# Patient Record
Sex: Female | Born: 1935 | Race: White | Hispanic: No | Marital: Married | Smoking: Never smoker
Health system: Southern US, Community
[De-identification: ages and names within clinical notes are randomized; demographics above are authoritative.]

## PROBLEM LIST (undated history)

## (undated) DIAGNOSIS — E669 Obesity, unspecified: Secondary | ICD-10-CM

## (undated) DIAGNOSIS — I10 Essential (primary) hypertension: Secondary | ICD-10-CM

## (undated) DIAGNOSIS — R942 Abnormal results of pulmonary function studies: Secondary | ICD-10-CM

## (undated) DIAGNOSIS — E119 Type 2 diabetes mellitus without complications: Secondary | ICD-10-CM

## (undated) DIAGNOSIS — I499 Cardiac arrhythmia, unspecified: Secondary | ICD-10-CM

## (undated) DIAGNOSIS — D649 Anemia, unspecified: Secondary | ICD-10-CM

## (undated) DIAGNOSIS — I472 Ventricular tachycardia, unspecified: Secondary | ICD-10-CM

## (undated) DIAGNOSIS — E785 Hyperlipidemia, unspecified: Secondary | ICD-10-CM

## (undated) DIAGNOSIS — Z79899 Other long term (current) drug therapy: Secondary | ICD-10-CM

## (undated) DIAGNOSIS — E039 Hypothyroidism, unspecified: Secondary | ICD-10-CM

## (undated) DIAGNOSIS — W010XXA Fall on same level from slipping, tripping and stumbling without subsequent striking against object, initial encounter: Secondary | ICD-10-CM

## (undated) HISTORY — DX: Hypothyroidism, unspecified: E03.9

## (undated) HISTORY — DX: Hyperlipidemia, unspecified: E78.5

## (undated) HISTORY — DX: Essential (primary) hypertension: I10

## (undated) HISTORY — DX: Other long term (current) drug therapy: Z79.899

## (undated) HISTORY — DX: Obesity, unspecified: E66.9

## (undated) HISTORY — PX: FRACTURE SURGERY: SHX138

## (undated) HISTORY — PX: VAGINAL HYSTERECTOMY: SUR661

## (undated) HISTORY — DX: Abnormal results of pulmonary function studies: R94.2

## (undated) HISTORY — DX: Anemia, unspecified: D64.9

## (undated) HISTORY — DX: Ventricular tachycardia, unspecified: I47.20

## (undated) HISTORY — DX: Ventricular tachycardia: I47.2

## (undated) HISTORY — PX: CATARACT EXTRACTION, BILATERAL: SHX1313

---

## 1976-05-22 HISTORY — PX: CARDIOVERSION: SHX1299

## 1997-11-13 ENCOUNTER — Other Ambulatory Visit: Admission: RE | Admit: 1997-11-13 | Discharge: 1997-11-13 | Payer: Self-pay | Admitting: Cardiology

## 1999-08-15 ENCOUNTER — Encounter: Admission: RE | Admit: 1999-08-15 | Discharge: 1999-08-15 | Payer: Self-pay | Admitting: *Deleted

## 2000-06-25 ENCOUNTER — Other Ambulatory Visit: Admission: RE | Admit: 2000-06-25 | Discharge: 2000-06-25 | Payer: Self-pay | Admitting: *Deleted

## 2000-08-15 ENCOUNTER — Encounter: Admission: RE | Admit: 2000-08-15 | Discharge: 2000-08-15 | Payer: Self-pay | Admitting: *Deleted

## 2000-08-30 ENCOUNTER — Encounter (INDEPENDENT_AMBULATORY_CARE_PROVIDER_SITE_OTHER): Payer: Self-pay | Admitting: Specialist

## 2000-08-30 ENCOUNTER — Ambulatory Visit (HOSPITAL_COMMUNITY): Admission: RE | Admit: 2000-08-30 | Discharge: 2000-08-30 | Payer: Self-pay | Admitting: *Deleted

## 2001-08-26 ENCOUNTER — Encounter: Admission: RE | Admit: 2001-08-26 | Discharge: 2001-08-26 | Payer: Self-pay | Admitting: Internal Medicine

## 2001-08-26 ENCOUNTER — Encounter: Payer: Self-pay | Admitting: Internal Medicine

## 2002-09-01 ENCOUNTER — Encounter: Payer: Self-pay | Admitting: Internal Medicine

## 2002-09-01 ENCOUNTER — Encounter: Admission: RE | Admit: 2002-09-01 | Discharge: 2002-09-01 | Payer: Self-pay | Admitting: Internal Medicine

## 2002-12-29 HISTORY — PX: US ECHOCARDIOGRAPHY: HXRAD669

## 2003-07-07 HISTORY — PX: CARDIOVASCULAR STRESS TEST: SHX262

## 2003-09-09 ENCOUNTER — Encounter: Admission: RE | Admit: 2003-09-09 | Discharge: 2003-09-09 | Payer: Self-pay | Admitting: Internal Medicine

## 2004-09-19 ENCOUNTER — Encounter: Admission: RE | Admit: 2004-09-19 | Discharge: 2004-09-19 | Payer: Self-pay | Admitting: Internal Medicine

## 2005-01-30 ENCOUNTER — Emergency Department (HOSPITAL_COMMUNITY): Admission: EM | Admit: 2005-01-30 | Discharge: 2005-01-30 | Payer: Self-pay | Admitting: Emergency Medicine

## 2005-06-01 ENCOUNTER — Encounter (HOSPITAL_COMMUNITY): Admission: RE | Admit: 2005-06-01 | Discharge: 2005-08-30 | Payer: Self-pay | Admitting: Internal Medicine

## 2005-09-07 ENCOUNTER — Encounter (HOSPITAL_COMMUNITY): Admission: RE | Admit: 2005-09-07 | Discharge: 2005-12-06 | Payer: Self-pay | Admitting: Internal Medicine

## 2005-09-21 ENCOUNTER — Encounter: Admission: RE | Admit: 2005-09-21 | Discharge: 2005-09-21 | Payer: Self-pay | Admitting: Internal Medicine

## 2005-12-14 ENCOUNTER — Encounter (HOSPITAL_COMMUNITY): Admission: RE | Admit: 2005-12-14 | Discharge: 2006-03-14 | Payer: Self-pay | Admitting: Internal Medicine

## 2006-03-22 ENCOUNTER — Encounter (HOSPITAL_COMMUNITY): Admission: RE | Admit: 2006-03-22 | Discharge: 2006-06-20 | Payer: Self-pay | Admitting: Internal Medicine

## 2006-05-22 DIAGNOSIS — R942 Abnormal results of pulmonary function studies: Secondary | ICD-10-CM

## 2006-05-22 HISTORY — DX: Abnormal results of pulmonary function studies: R94.2

## 2006-06-28 ENCOUNTER — Encounter (HOSPITAL_COMMUNITY): Admission: RE | Admit: 2006-06-28 | Discharge: 2006-09-26 | Payer: Self-pay | Admitting: Internal Medicine

## 2006-09-26 ENCOUNTER — Encounter: Admission: RE | Admit: 2006-09-26 | Discharge: 2006-09-26 | Payer: Self-pay | Admitting: Internal Medicine

## 2006-10-04 ENCOUNTER — Encounter (HOSPITAL_COMMUNITY): Admission: RE | Admit: 2006-10-04 | Discharge: 2007-01-02 | Payer: Self-pay | Admitting: Internal Medicine

## 2006-10-08 ENCOUNTER — Inpatient Hospital Stay (HOSPITAL_COMMUNITY): Admission: AD | Admit: 2006-10-08 | Discharge: 2006-10-10 | Payer: Self-pay | Admitting: Cardiology

## 2007-01-07 ENCOUNTER — Ambulatory Visit (HOSPITAL_COMMUNITY): Admission: RE | Admit: 2007-01-07 | Discharge: 2007-01-07 | Payer: Self-pay | Admitting: Cardiology

## 2007-01-17 ENCOUNTER — Encounter (HOSPITAL_COMMUNITY): Admission: RE | Admit: 2007-01-17 | Discharge: 2007-04-17 | Payer: Self-pay | Admitting: Internal Medicine

## 2007-05-09 ENCOUNTER — Encounter (HOSPITAL_COMMUNITY): Admission: RE | Admit: 2007-05-09 | Discharge: 2007-08-07 | Payer: Self-pay | Admitting: Internal Medicine

## 2007-08-15 ENCOUNTER — Encounter (HOSPITAL_COMMUNITY): Admission: RE | Admit: 2007-08-15 | Discharge: 2007-11-13 | Payer: Self-pay | Admitting: Internal Medicine

## 2007-10-09 ENCOUNTER — Encounter: Admission: RE | Admit: 2007-10-09 | Discharge: 2007-10-09 | Payer: Self-pay | Admitting: Internal Medicine

## 2007-12-19 ENCOUNTER — Encounter (HOSPITAL_COMMUNITY): Admission: RE | Admit: 2007-12-19 | Discharge: 2008-03-18 | Payer: Self-pay | Admitting: Internal Medicine

## 2008-04-23 ENCOUNTER — Encounter (HOSPITAL_COMMUNITY): Admission: RE | Admit: 2008-04-23 | Discharge: 2008-05-21 | Payer: Self-pay | Admitting: Internal Medicine

## 2008-06-18 ENCOUNTER — Encounter (HOSPITAL_COMMUNITY): Admission: RE | Admit: 2008-06-18 | Discharge: 2008-08-13 | Payer: Self-pay | Admitting: Internal Medicine

## 2008-09-24 ENCOUNTER — Encounter (HOSPITAL_COMMUNITY): Admission: RE | Admit: 2008-09-24 | Discharge: 2008-12-23 | Payer: Self-pay | Admitting: Internal Medicine

## 2008-10-12 ENCOUNTER — Encounter: Admission: RE | Admit: 2008-10-12 | Discharge: 2008-10-12 | Payer: Self-pay | Admitting: Internal Medicine

## 2008-12-31 ENCOUNTER — Encounter (HOSPITAL_COMMUNITY): Admission: RE | Admit: 2008-12-31 | Discharge: 2009-03-31 | Payer: Self-pay | Admitting: Internal Medicine

## 2009-03-26 ENCOUNTER — Encounter (HOSPITAL_COMMUNITY): Admission: RE | Admit: 2009-03-26 | Discharge: 2009-03-30 | Payer: Self-pay | Admitting: Internal Medicine

## 2009-05-13 ENCOUNTER — Encounter (HOSPITAL_COMMUNITY): Admission: RE | Admit: 2009-05-13 | Discharge: 2009-08-11 | Payer: Self-pay | Admitting: Internal Medicine

## 2009-08-26 ENCOUNTER — Encounter (HOSPITAL_COMMUNITY): Admission: RE | Admit: 2009-08-26 | Discharge: 2009-11-24 | Payer: Self-pay | Admitting: Internal Medicine

## 2009-10-14 ENCOUNTER — Encounter: Admission: RE | Admit: 2009-10-14 | Discharge: 2009-10-14 | Payer: Self-pay | Admitting: Internal Medicine

## 2009-12-02 ENCOUNTER — Encounter (HOSPITAL_COMMUNITY): Admission: RE | Admit: 2009-12-02 | Discharge: 2010-03-02 | Payer: Self-pay | Admitting: Internal Medicine

## 2010-01-05 ENCOUNTER — Ambulatory Visit: Payer: Self-pay | Admitting: Cardiology

## 2010-03-08 ENCOUNTER — Encounter (HOSPITAL_COMMUNITY)
Admission: RE | Admit: 2010-03-08 | Discharge: 2010-06-06 | Payer: Self-pay | Source: Home / Self Care | Attending: Internal Medicine | Admitting: Internal Medicine

## 2010-05-02 ENCOUNTER — Ambulatory Visit: Payer: Self-pay | Admitting: Cardiology

## 2010-06-01 ENCOUNTER — Ambulatory Visit: Payer: Self-pay | Admitting: Cardiology

## 2010-06-30 ENCOUNTER — Encounter (HOSPITAL_COMMUNITY)
Admission: RE | Admit: 2010-06-30 | Discharge: 2010-06-30 | Disposition: A | Discharge status: Home / Self Care | Payer: Medicare Other | Source: Ambulatory Visit | Attending: Internal Medicine | Admitting: Internal Medicine

## 2010-06-30 ENCOUNTER — Other Ambulatory Visit: Payer: Self-pay

## 2010-06-30 DIAGNOSIS — N289 Disorder of kidney and ureter, unspecified: Secondary | ICD-10-CM | POA: Insufficient documentation

## 2010-06-30 DIAGNOSIS — N2581 Secondary hyperparathyroidism of renal origin: Secondary | ICD-10-CM | POA: Insufficient documentation

## 2010-06-30 DIAGNOSIS — D638 Anemia in other chronic diseases classified elsewhere: Secondary | ICD-10-CM | POA: Insufficient documentation

## 2010-08-01 LAB — POCT HEMOGLOBIN-HEMACUE: Hemoglobin: 11.1 g/dL — ABNORMAL LOW (ref 12.0–15.0)

## 2010-08-04 LAB — POCT HEMOGLOBIN-HEMACUE: Hemoglobin: 11.4 g/dL — ABNORMAL LOW (ref 12.0–15.0)

## 2010-08-05 LAB — POCT HEMOGLOBIN-HEMACUE: Hemoglobin: 10.4 g/dL — ABNORMAL LOW (ref 12.0–15.0)

## 2010-08-08 LAB — POCT HEMOGLOBIN-HEMACUE: Hemoglobin: 11.5 g/dL — ABNORMAL LOW (ref 12.0–15.0)

## 2010-08-10 LAB — POCT HEMOGLOBIN-HEMACUE: Hemoglobin: 11.2 g/dL — ABNORMAL LOW (ref 12.0–15.0)

## 2010-08-11 ENCOUNTER — Other Ambulatory Visit: Payer: Self-pay | Admitting: Internal Medicine

## 2010-08-11 ENCOUNTER — Encounter (HOSPITAL_COMMUNITY): Payer: Medicare Other | Attending: Internal Medicine

## 2010-08-11 DIAGNOSIS — D638 Anemia in other chronic diseases classified elsewhere: Secondary | ICD-10-CM | POA: Insufficient documentation

## 2010-08-12 LAB — POCT HEMOGLOBIN-HEMACUE: Hemoglobin: 10.7 g/dL — ABNORMAL LOW (ref 12.0–15.0)

## 2010-08-14 LAB — HEMOGLOBIN AND HEMATOCRIT, BLOOD: HCT: 36.7 % (ref 36.0–46.0)

## 2010-08-24 ENCOUNTER — Encounter: Payer: Self-pay | Admitting: Nurse Practitioner

## 2010-08-25 ENCOUNTER — Other Ambulatory Visit: Payer: Self-pay | Admitting: Cardiology

## 2010-08-25 ENCOUNTER — Encounter: Payer: Self-pay | Admitting: Nurse Practitioner

## 2010-08-25 DIAGNOSIS — Z79899 Other long term (current) drug therapy: Secondary | ICD-10-CM

## 2010-08-25 DIAGNOSIS — E039 Hypothyroidism, unspecified: Secondary | ICD-10-CM

## 2010-08-25 DIAGNOSIS — O24919 Unspecified diabetes mellitus in pregnancy, unspecified trimester: Secondary | ICD-10-CM

## 2010-08-25 DIAGNOSIS — D649 Anemia, unspecified: Secondary | ICD-10-CM

## 2010-08-28 LAB — POCT HEMOGLOBIN-HEMACUE: Hemoglobin: 10.1 g/dL — ABNORMAL LOW (ref 12.0–15.0)

## 2010-08-29 ENCOUNTER — Other Ambulatory Visit (INDEPENDENT_AMBULATORY_CARE_PROVIDER_SITE_OTHER): Payer: Medicare Other | Admitting: *Deleted

## 2010-08-29 ENCOUNTER — Ambulatory Visit (INDEPENDENT_AMBULATORY_CARE_PROVIDER_SITE_OTHER): Payer: Medicare Other | Admitting: Nurse Practitioner

## 2010-08-29 ENCOUNTER — Encounter: Payer: Self-pay | Admitting: Nurse Practitioner

## 2010-08-29 VITALS — BP 140/80 | HR 68 | Ht 62.0 in | Wt 182.8 lb

## 2010-08-29 DIAGNOSIS — D649 Anemia, unspecified: Secondary | ICD-10-CM

## 2010-08-29 DIAGNOSIS — O24919 Unspecified diabetes mellitus in pregnancy, unspecified trimester: Secondary | ICD-10-CM

## 2010-08-29 DIAGNOSIS — R942 Abnormal results of pulmonary function studies: Secondary | ICD-10-CM

## 2010-08-29 DIAGNOSIS — Z79899 Other long term (current) drug therapy: Secondary | ICD-10-CM

## 2010-08-29 DIAGNOSIS — Z8679 Personal history of other diseases of the circulatory system: Secondary | ICD-10-CM | POA: Insufficient documentation

## 2010-08-29 DIAGNOSIS — M109 Gout, unspecified: Secondary | ICD-10-CM | POA: Insufficient documentation

## 2010-08-29 DIAGNOSIS — E785 Hyperlipidemia, unspecified: Secondary | ICD-10-CM

## 2010-08-29 DIAGNOSIS — I10 Essential (primary) hypertension: Secondary | ICD-10-CM

## 2010-08-29 DIAGNOSIS — I472 Ventricular tachycardia: Secondary | ICD-10-CM

## 2010-08-29 DIAGNOSIS — E039 Hypothyroidism, unspecified: Secondary | ICD-10-CM

## 2010-08-29 LAB — CBC WITH DIFFERENTIAL/PLATELET
Basophils Absolute: 0 10*3/uL (ref 0.0–0.1)
Eosinophils Relative: 1.4 % (ref 0.0–5.0)
HCT: 33.7 % — ABNORMAL LOW (ref 36.0–46.0)
Hemoglobin: 11.3 g/dL — ABNORMAL LOW (ref 12.0–15.0)
Lymphocytes Relative: 37 % (ref 12.0–46.0)
Lymphs Abs: 2.5 10*3/uL (ref 0.7–4.0)
Monocytes Relative: 8.2 % (ref 3.0–12.0)
Platelets: 152 10*3/uL (ref 150.0–400.0)
WBC: 6.9 10*3/uL (ref 4.5–10.5)

## 2010-08-29 LAB — BASIC METABOLIC PANEL
BUN: 29 mg/dL — ABNORMAL HIGH (ref 6–23)
CO2: 22 mEq/L (ref 19–32)
Calcium: 9.4 mg/dL (ref 8.4–10.5)
GFR: 40.08 mL/min — ABNORMAL LOW (ref >60.00)
Glucose, Bld: 172 mg/dL — ABNORMAL HIGH (ref 70–99)

## 2010-08-29 LAB — HEPATIC FUNCTION PANEL
AST: 27 U/L (ref 0–37)
Albumin: 3.4 g/dL — ABNORMAL LOW (ref 3.5–5.2)
Total Protein: 6.7 g/dL (ref 6.0–8.3)

## 2010-08-29 LAB — TSH: TSH: 4.28 u[IU]/mL (ref 0.35–5.50)

## 2010-08-29 MED ORDER — ALLOPURINOL 300 MG PO TABS: 300.0000 mg | ORAL_TABLET | Freq: Every day | ORAL | Status: DC

## 2010-08-29 MED ORDER — PRAVASTATIN SODIUM 20 MG PO TABS: 20.0000 mg | ORAL_TABLET | Freq: Every day | ORAL | Status: DC

## 2010-08-29 NOTE — Assessment & Plan Note (Signed)
She remains on Pravachol. Refill is given today.

## 2010-08-29 NOTE — Patient Instructions (Signed)
Monitor your blood pressure when you go to [place] your readings and bring to your next visit. Limit sodium intake. I will have you see Dr. Rayann Heman in 4 months. You will need fasting labs at that time.

## 2010-08-29 NOTE — Assessment & Plan Note (Signed)
Blood pressure is now trending down. She had just taken her medicines prior to the visit. I encouraged her to monitor her blood pressure when she goes to [employer].

## 2010-08-29 NOTE — Assessment & Plan Note (Signed)
No recurrence. Remains on long term amiodarone.

## 2010-08-29 NOTE — Progress Notes (Signed)
History of Present Illness: Leslie Coleman is seen today for her 3 month visit. She is seen for Dr. Doreatha Lew. She is doing well. She is tolerating her medicines. She adamantly denies shortness of breath. She does not check her blood pressure at home but does go to [place] on a weekly basis. She just took her blood pressure medicines before today's visit. She is upset about Dr. Doreatha Lew retiring. She has no specific complaint.   Current Outpatient Prescriptions on File Prior to Visit  Medication Sig Dispense Refill  . acetaminophen (TYLENOL) 500 MG tablet Take 500 mg by mouth as needed.        Marland Kitchen amiodarone (PACERONE) 200 MG tablet Take 200 mg by mouth daily.        Marland Kitchen amLODipine (NORVASC) 10 MG tablet Take 10 mg by mouth daily.        Marland Kitchen atenolol (TENORMIN) 100 MG tablet Take 100 mg by mouth 2 (two) times daily.        Marland Kitchen Epoetin Alfa (PROCRIT IJ) Inject as directed. As directed       . glyBURIDE-metformin (GLUCOVANCE) 5-500 MG per tablet Take 1 tablet by mouth 2 (two) times daily with a meal.        . hydrALAZINE (APRESOLINE) 25 MG tablet Take 25 mg by mouth 2 (two) times daily.        . Iron-Vitamins (GERITOL PO) Take by mouth at bedtime.        Marland Kitchen levothyroxine (SYNTHROID, LEVOTHROID) 25 MCG tablet Take 25 mcg by mouth daily. Taking one tablet days per week and two tablets two days per week       . Multiple Vitamins-Iron (MULTIVITAMINS WITH IRON) TABS Take 1 tablet by mouth daily.        Marland Kitchen omega-3 acid ethyl esters (LOVAZA) 1 G capsule Take 2 g by mouth 2 (two) times daily.        . perindopril (ACEON) 8 MG tablet Take 8 mg by mouth 2 (two) times daily.        . pioglitazone (ACTOS) 30 MG tablet Take 30 mg by mouth daily.        . potassium chloride (KLOR-CON) 10 MEQ CR tablet Take 10 mEq by mouth daily.        Marland Kitchen telmisartan-hydrochlorothiazide (MICARDIS HCT) 80-12.5 MG per tablet Take 1 tablet by mouth daily.        Marland Kitchen DISCONTD: allopurinol (ZYLOPRIM) 300 MG tablet Take 300 mg by mouth daily.        Marland Kitchen  DISCONTD: pravastatin (PRAVACHOL) 20 MG tablet Take 20 mg by mouth daily.          No Known Allergies  Past Medical History  Diagnosis Date  . V-tach     on chronic amiodarone therapy  . HTN (hypertension)     Last echo in 2004  . Hyperlipidemia   . Diabetes mellitus   . Obesity   . Hypothyroidism     on replacement  . Anemia     treated with Procrit  . Gout   . Abnormal PFTs (pulmonary function tests)     Past Surgical History  Procedure Date  . Cardioversion 1978  . Cesarean section     x 3  . Abdominal hysterectomy     History  Smoking status  . Never Smoker   Smokeless tobacco  . Never Used    History  Alcohol Use No    Family History  Problem Relation Age of Onset  . Heart disease  Mother   . Heart disease Father     Review of Systems: The review of systems is as above. She is not dizzy. She has no chest pain. She denies dyspnea or excessive fatigue. She does not exercise. She has had no gout flareups..  All other systems were reviewed and are negative.  Physical Exam: BP 140/80  Pulse 68  Ht 5\' 2"  (1.575 m)  Wt 182 lb 12.8 oz (82.918 kg)  BMI 33.43 kg/m2 Patient is very pleasant and in no acute distress. She is obese.  Skin is warm and dry. Color is chronically sallow.  HEENT is unremarkable. Normocephalic/atraumatic. PERRL. Sclera are nonicteric. Neck is supple. No masses. No JVD. Lungs are clear. Cardiac exam shows a regular rate and rhythm. Abdomen is soft. Extremities are without edema. Gait and ROM are intact. No gross neurologic deficits noted.  LABORATORY DATA:  Pending  Assessment / Plan:

## 2010-08-29 NOTE — Assessment & Plan Note (Signed)
No recurrence. Her allopurinol is refilled today.

## 2010-08-29 NOTE — Assessment & Plan Note (Signed)
She remains on her amiodarone. Follow up labs are checked today. She has had no recurrence of her Vtach. I will have her see Dr. Rayann Heman in 3 months with repeat labs on return. Patient is agreeable to this plan and will call if any problems develop in the interim.

## 2010-08-29 NOTE — Assessment & Plan Note (Signed)
Clinically not short of breath. Will need to update on return visit.

## 2010-08-31 ENCOUNTER — Telehealth: Payer: Self-pay | Admitting: *Deleted

## 2010-08-31 NOTE — Telephone Encounter (Signed)
Pt notified of lab results and to continue same medications.

## 2010-08-31 NOTE — Telephone Encounter (Signed)
Message copied by Raelene Bott on Wed Aug 31, 2010  8:04 AM ------      Message from: Truitt Merle      Created: Mon Aug 29, 2010  2:07 PM       Ok to report. Labs are satisfactory. Has chronic anemia. Hemoglobin unchanged. Renal function is a little better. Continue with same dose of synthroid.      Will recheck labs on return visit.

## 2010-09-06 LAB — POCT HEMOGLOBIN-HEMACUE: Hemoglobin: 10.5 g/dL — ABNORMAL LOW (ref 12.0–15.0)

## 2010-09-20 ENCOUNTER — Other Ambulatory Visit: Payer: Self-pay | Admitting: Internal Medicine

## 2010-09-20 DIAGNOSIS — Z1231 Encounter for screening mammogram for malignant neoplasm of breast: Secondary | ICD-10-CM

## 2010-09-22 ENCOUNTER — Encounter (HOSPITAL_COMMUNITY): Payer: Medicare Other | Attending: Internal Medicine

## 2010-09-22 ENCOUNTER — Other Ambulatory Visit: Payer: Self-pay | Admitting: Internal Medicine

## 2010-09-22 DIAGNOSIS — D638 Anemia in other chronic diseases classified elsewhere: Secondary | ICD-10-CM | POA: Insufficient documentation

## 2010-09-22 DIAGNOSIS — N2581 Secondary hyperparathyroidism of renal origin: Secondary | ICD-10-CM | POA: Insufficient documentation

## 2010-09-22 DIAGNOSIS — N289 Disorder of kidney and ureter, unspecified: Secondary | ICD-10-CM | POA: Insufficient documentation

## 2010-09-30 ENCOUNTER — Other Ambulatory Visit: Payer: Self-pay | Admitting: Nurse Practitioner

## 2010-09-30 ENCOUNTER — Other Ambulatory Visit: Payer: Self-pay | Admitting: *Deleted

## 2010-09-30 MED ORDER — LEVOTHYROXINE SODIUM 25 MCG PO TABS: 25.0000 ug | ORAL_TABLET | Freq: Every day | ORAL | Status: DC

## 2010-09-30 NOTE — Telephone Encounter (Signed)
NEEDS TO GET SCRIPT CALLED INTO [facility] [address] SYNTHYROID 25MCG. WANTS GENERIC. WANTS ENOUGH FOR AT LEAST 10 DAYS THEN PHONE IT INTO [facility]. [id number] SCRIPT NUMBER IS [id number]

## 2010-09-30 NOTE — Telephone Encounter (Signed)
Revised prescription sent today for Synthroid 25 mcg to #20 tablets.  Called to [facility] at [phone].  Pt notified.

## 2010-10-04 NOTE — Discharge Summary (Signed)
NAMESOLANCH, RICHOUX               ACCOUNT NO.:  [id number]   MEDICAL RECORD NO.:  [id number]          PATIENT TYPE:  INP   LOCATION:  3740                         FACILITY:  [facility]   PHYSICIAN:  Ludwig Lean. Doreatha Lew, M.D.DATE OF BIRTH:  10-24-1928   DATE OF ADMISSION:  10/08/2006  DATE OF DISCHARGE:  10/10/2006                               DISCHARGE SUMMARY   PRIMARY DISCHARGE DIAGNOSES:  1. Remote history of ventricular tachycardia subsequently with      discontinuation of procainamide and successful initiation of      loading with amiodarone.  2. Abnormal pulmonary function tests with diffusion defect that is      consistent with a pulmonary vascular process.  There is no      significant obstruction.  The reduced diffusing capacity indicates      a moderate loss of functional alveolar capillary surface.   SECONDARY DISCHARGE DIAGNOSES:  1. Hypertensive heart disease.  2. Remote history of ventricular tachycardia in 1978, 1986 and again      in 1988.  She had previously been maintained on procainamide for      the past 20 years.  3. Diabetes.  4. Anemia.  She is on Procrit.  5. Dyslipidemia.  6. History of gout.   HISTORY OF PRESENT ILLNESS:  The patient is a very pleasant 77 year old  white female who has multiple medical problems.  She presented for  elective initiation of amiodarone.  She has been maintained on  procainamide for the past 20 years through a documented history of  ventricular tachycardia, but now is unable to have her medicine  available to her from the pharmacies or distributors.  She has had no  recurrence of arrhythmia but is felt to need to be maintained on chronic  antiarrhythmic therapy.   Please see the dictated history and physical for further patient  presentation and profile.   LABORATORY DATA:  On admission, her EKG showed sinus bradycardia.  CBC  showed a hemoglobin of 11, hematocrit 33.  TSH was normal.  Chemistries  were normal with a BUN  of 18, creatinine was 1.1, potassium 4.9, glucose  was 147.  Her chest x-ray showed borderline heart size with no edema.  Her PFTs are as noted above.   HOSPITAL COURSE:  The patient was admitted electively.  Procainamide was  discontinued on the morning of admission.  She was subsequently started  on amiodarone which has been tolerated without problems.  Her PFTs have  been reviewed, and it is felt that we will monitor this closely as an  outpatient and will continue with only low-dose antiarrhythmic therapy.   Today on Oct 10, 2006, she is doing well without complaints.  She has  had no recurrence of arrhythmia.  She is been up and ambulatory without  problems.  She appears to be tolerating her medicines without problems,  and she is felt to be a stable candidate for discharge.   DISCHARGE CONDITION:  Stable.   DISCHARGE DIET:  Diabetic.   ACTIVITY:  No restrictions.   DISCHARGE MEDICATIONS:  1. Micardis HCT 80/12.5 daily.  2. Norvasc 10 mg a day.  3. Atenolol 100 b.i.d.  4. Glucovance as before.  5. Allopurinol 300 mg a day.  6. KCl 10 mEq a day.  7. Lopid 600 b.i.d.  8. Actos 30 mg a day.  9. Aceon 8 mg a day.  10.Multivitamin and Geritol daily.  11.Luvasa two in the morning, two in the evening  (Procainamide has been discontinued).  1. She will be sent home on amiodarone 200 mg b.i.d. until seen back      in the office with plans to decrease that to once a day.   Will plan on seeing her back in the office in 1 week.  She will need to  have follow up PFTs an 3 months, 6 months, 9 months and again in 12  months.  She is to call if any problems arise in the interim.      Doyle Askew, N.P.      Ludwig Lean. Doreatha Lew, M.D.  Electronically Signed    LC/MEDQ  D:  10/10/2006  T:  10/10/2006  Job:  [id number]

## 2010-10-04 NOTE — H&P (Signed)
Leslie Coleman, Leslie Coleman               ACCOUNT NO.:  [id number]   MEDICAL RECORD NO.:  [id number]          PATIENT TYPE:  INP   LOCATION:  3740                         FACILITY:  [facility]   PHYSICIAN:  Ludwig Lean. Doreatha Lew, M.D.DATE OF BIRTH:  12-Sep-1928   DATE OF ADMISSION:  10/08/2006  DATE OF DISCHARGE:                              HISTORY & PHYSICAL   CHIEF COMPLAINT:  None.   HISTORY OF PRESENT ILLNESS:  Leslie Coleman is a very pleasant 77 year old white  female who has multiple medical problems.  She presents for initiation  of amiodarone.  She has been maintained on procainamide for the past 20  years through a documented history of ventricular tachycardia.  She had  an episode in 1978, and then again in 1986.  At that time, she went to  [facility] and had a negative VP study.  In 1988, she had recurrence of  her arrhythmia and she has been on procainamide ever since.  This  medicine is no longer available through the pharmacies or the  distributor.  She has had no recurrence of her arrhythmia, but is felt  to need to be maintained on chronic anti-arrhythmic therapy.   PAST MEDICAL HISTORY:  1. Hypertensive heart disease.  Her last 2D echocardiogram was in      August of 2004 which showed an ejection fraction of 60 to 65% with      normal LV systolic function, mild aorta sclerosis in the LV wall      thickness of the upper limits of normal.  2. Remote history of ventricular tachycardia in 1978, 1986, and again      in 1988.  She has been maintained on procainamide for the past 20      years.  3. Diabetes.  4. Anemia.  She is on Procrit.  5. Dyslipidemia.  6. Remote cardioversion in 1978 for SVT with aberrancy.  7. C-section times 3.  8. Remote hysterectomy.  9. Gout.   ALLERGIES:  NONE.   CURRENT MEDICATIONS:  1. She was previously on procainamide 100 mg q.i.d.  Her last dose was      this morning on Oct 08, 2006.  2. Norvasc 10 mg a day.  3. Atenolol 100 b.i.d.  4. Glucovance  5/500 two tablets b.i.d.  5. Allopurinol 300 mg a day.  6. Potassium 10 meq a day.  7. Lopid 600 b.i.d.  8. Actos 30 a day.  9. Aceon 8 mg a day.  10.Multivitamin with iron daily.  11.Geritol daily.  12.Micardis HCT 80/12.5 daily.  13.Procrit.  14.Lovaza 2 in the morning, 2 in the evening.   FAMILY HISTORY:  Positive for heart disease.   SOCIAL HISTORY:  She is married.  She just recently celebrated the birth  of her first great grandchild.  She has no tobacco or alcohol.   REVIEW OF SYSTEMS:  Is as noted above and is otherwise unremarkable.  She has had no complaints of chest pain or shortness of breath.  No  complaints of palpitations or recurrent arrhythmia.   EXAMINATION:  GENERAL:  She is a very pleasant elderly  white female in  no acute distress.  VITAL SIGNS:  Blood pressure 140/90, heart rate is in the 70s,  respirations 18, she is afebrile.  SKIN:  Warm and dry.  Color is unremarkable.  LUNGS:  Clear.  HEART:  Shows a regular rhythm.  ABDOMEN:  Obese, yet soft.  Positive bowel sounds.  EXTREMITIES:  Without edema.  NEUROLOGIC:  Shows no gross focal deficit.   PERTINENT LABS:  Pending.   OVERALL IMPRESSION:  Remote ventricular tachycardia with a negative VP  study dating back to 65.  She had recurrence of her arrhythmia in 1988  and has been maintained on chronic procainamide since that time.   PLAN:  She will be switched over to amiodarone.  She will be monitored  on telemetry.  Will do daily EKGs.  Her other medicines will continue  per her home routine.      Leslie Coleman, N.P.      Ludwig Lean. Doreatha Lew, M.D.  Electronically Signed    LC/MEDQ  D:  10/08/2006  T:  10/08/2006  Job:  [id number]   cc:   Leslie Coleman, M.D.

## 2010-10-07 NOTE — Procedures (Signed)
[facility]  Patient:    Leslie Coleman, Leslie Coleman                      MRN: [id number] Proc. Date: 08/30/00 Adm. Date:  [id number] Attending:  Jim Desanctis                           Procedure Report  PROCEDURE:  Upper endoscopy.  INDICATIONS FOR PROCEDURE:  GI bleed.  ANESTHESIA:  Demerol 40, Versed 6 mg.  DESCRIPTION OF PROCEDURE:  With the patient mildly sedated in the left lateral decubitus position, the Olympus video endoscope was inserted in the mouth and passed under direct vision through the esophagus, stomach and into the duodenum all of which appeared normal. From this point, the endoscope was slowly withdrawn taking circumferential views of the entire duodenal mucosa until the endoscope was then pulled back into the stomach and placed in retroflexion to view the stomach from below which showed a hiatal hernia. The endoscope was then straightened and withdrawn taking circumferential views of the entire gastric and subsequently esophageal mucosa which otherwise appeared normal. Other than some mild changes of esophagitis, the patients vital signs and pulse oximeter remained stable. The patient tolerated the procedure well without apparent complications.  FINDINGS:  Mild esophagitis above a small hiatal hernia otherwise unremarkable examination.  PLAN:  Proceed to colonoscopy. DD:  08/30/00 TD:  08/30/00 Job: [id number]

## 2010-10-07 NOTE — Procedures (Signed)
[facility]  Patient:    POLET, ALLGOOD                      MRN: [id number] Proc. Date: 08/30/00 Adm. Date:  [id number] Attending:  Jim Desanctis                           Procedure Report  PROCEDURE:  Colonoscopy.  INDICATIONS FOR PROCEDURE:  Hemoccult positivity.  ANESTHESIA:  Demerol 20 mg.  DESCRIPTION OF PROCEDURE:  With the patient mildly sedated in the left lateral decubitus position, the Olympus video colonoscope was inserted in the rectum and passed easily under direct vision to the cecum identified by the ileocecal valve and appendiceal orifice both of which were photographed. From this point, the colonoscope was slowly withdrawn taking circumferential views of the entire colonic mucosa. There was a small question of a polyp seen in the descending colon which was removed using hot biopsy forceps technique on a setting of 20:20 blended current. The endoscope was then withdrawn to the rectum which appeared normal in direct and retroflexed view. The endoscope was straightened and withdrawn. The patients vital signs and pulse oximeter remained stable. The patient tolerated the procedure well without apparent complications.  FINDINGS:  Small polyp question of descending colon.  PLAN:  Await biopsy report. The patient will call me for results and followup with me in as an outpatient. DD:  08/30/00 TD:  08/30/00 Job: [id number]

## 2010-10-19 ENCOUNTER — Ambulatory Visit
Admission: RE | Admit: 2010-10-19 | Discharge: 2010-10-19 | Disposition: A | Discharge status: Home / Self Care | Payer: Medicare Other | Source: Ambulatory Visit | Attending: Internal Medicine | Admitting: Internal Medicine

## 2010-10-19 DIAGNOSIS — Z1231 Encounter for screening mammogram for malignant neoplasm of breast: Secondary | ICD-10-CM

## 2010-11-03 ENCOUNTER — Other Ambulatory Visit: Payer: Self-pay | Admitting: Internal Medicine

## 2010-11-03 ENCOUNTER — Encounter (HOSPITAL_COMMUNITY): Payer: Medicare Other | Attending: Internal Medicine

## 2010-11-03 DIAGNOSIS — N289 Disorder of kidney and ureter, unspecified: Secondary | ICD-10-CM | POA: Insufficient documentation

## 2010-11-03 DIAGNOSIS — D638 Anemia in other chronic diseases classified elsewhere: Secondary | ICD-10-CM | POA: Insufficient documentation

## 2010-11-03 DIAGNOSIS — N2581 Secondary hyperparathyroidism of renal origin: Secondary | ICD-10-CM | POA: Insufficient documentation

## 2010-12-15 ENCOUNTER — Other Ambulatory Visit: Payer: Self-pay | Admitting: Internal Medicine

## 2010-12-15 ENCOUNTER — Encounter (HOSPITAL_COMMUNITY): Payer: Medicare Other | Attending: Internal Medicine

## 2010-12-15 DIAGNOSIS — N289 Disorder of kidney and ureter, unspecified: Secondary | ICD-10-CM | POA: Insufficient documentation

## 2010-12-15 DIAGNOSIS — D638 Anemia in other chronic diseases classified elsewhere: Secondary | ICD-10-CM | POA: Insufficient documentation

## 2010-12-15 DIAGNOSIS — N2581 Secondary hyperparathyroidism of renal origin: Secondary | ICD-10-CM | POA: Insufficient documentation

## 2010-12-15 LAB — POCT HEMOGLOBIN-HEMACUE: Hemoglobin: 11 g/dL — ABNORMAL LOW (ref 12.0–15.0)

## 2011-01-06 ENCOUNTER — Encounter: Payer: Self-pay | Admitting: Internal Medicine

## 2011-01-09 ENCOUNTER — Ambulatory Visit: Payer: Medicare Other | Admitting: Internal Medicine

## 2011-01-09 ENCOUNTER — Encounter: Payer: Self-pay | Admitting: Internal Medicine

## 2011-01-09 ENCOUNTER — Ambulatory Visit (INDEPENDENT_AMBULATORY_CARE_PROVIDER_SITE_OTHER): Payer: Medicare Other | Admitting: Internal Medicine

## 2011-01-09 VITALS — BP 132/74 | HR 62 | Ht 62.0 in | Wt 188.8 lb

## 2011-01-09 DIAGNOSIS — I472 Ventricular tachycardia: Secondary | ICD-10-CM

## 2011-01-09 DIAGNOSIS — I10 Essential (primary) hypertension: Secondary | ICD-10-CM

## 2011-01-09 DIAGNOSIS — E785 Hyperlipidemia, unspecified: Secondary | ICD-10-CM

## 2011-01-09 NOTE — Patient Instructions (Signed)
Your physician recommends that you schedule a follow-up appointment in: 4 months with Kathrene Alu and Dr Rayann Heman in 8 months  Your physician recommends that you continue on your current medications as directed. Please refer to the Current Medication list given to you today.

## 2011-01-09 NOTE — Assessment & Plan Note (Signed)
Stable No change required today  

## 2011-01-09 NOTE — Assessment & Plan Note (Signed)
Controlled with amiodarone No changes today She will need LFTs and TFTs upon return with Truitt Merle.

## 2011-01-09 NOTE — Progress Notes (Signed)
Leslie Coleman is a pleasant 77 y.o. WF patient with a h/o remote ventricular tachycardia followed previously by Dr Doreatha Lew.  She had EP study (per his report) in the 1980s with no inducible VT.  She was treated initially with procainamide but has been on amiodarone since 2008.  She appears to be doing very well and remains active despite her age.  She denies any recent episodes of arrhythmia.  Today, she denies symptoms of palpitations, chest pain, shortness of breath, orthopnea, PND, lower extremity edema, dizziness, presyncope, syncope, or neurologic sequela. The patient is tolerating medications without difficulties and is otherwise without complaint today.   Past Medical History  Diagnosis Date  . V-tach     remote VT, treated initially with procainamide, on amiodarone since 2008  . HTN (hypertension)     Last echo in 2004  . Hyperlipidemia   . Diabetes mellitus   . Obesity   . Hypothyroidism     on replacement  . Anemia     treated with Procrit  . Gout   . Abnormal PFTs (pulmonary function tests)    Past Surgical History  Procedure Date  . Cardioversion 1978  . Cesarean section     x 3  . Abdominal hysterectomy     Current Outpatient Prescriptions  Medication Sig Dispense Refill  . acetaminophen (TYLENOL) 500 MG tablet Take 500 mg by mouth as needed.        Marland Kitchen allopurinol (ZYLOPRIM) 300 MG tablet Take 1 tablet (300 mg total) by mouth daily.  90 tablet  3  . amiodarone (PACERONE) 200 MG tablet Take 200 mg by mouth daily.        Marland Kitchen amLODipine (NORVASC) 10 MG tablet Take 10 mg by mouth daily.        Marland Kitchen atenolol (TENORMIN) 100 MG tablet Take 100 mg by mouth 2 (two) times daily.        Marland Kitchen Epoetin Alfa (PROCRIT IJ) Inject as directed. As directed       . glyBURIDE-metformin (GLUCOVANCE) 5-500 MG per tablet Take 1 tablet by mouth 2 (two) times daily with a meal.        . hydrALAZINE (APRESOLINE) 25 MG tablet Take 25 mg by mouth 2 (two) times daily.        . Iron-Vitamins (GERITOL  PO) Take by mouth at bedtime.        Marland Kitchen levothyroxine (SYNTHROID, LEVOTHROID) 25 MCG tablet Take 1 tablet (25 mcg total) by mouth daily. Taking one tablet days per week and two tablets two days per week  10 tablet  0  . Multiple Vitamins-Iron (MULTIVITAMINS WITH IRON) TABS Take 1 tablet by mouth daily.        Marland Kitchen omega-3 acid ethyl esters (LOVAZA) 1 G capsule Take 2 g by mouth 2 (two) times daily.        . perindopril (ACEON) 8 MG tablet Take 8 mg by mouth 2 (two) times daily.        . pioglitazone (ACTOS) 30 MG tablet Take 15 mg by mouth daily.       . potassium chloride (KLOR-CON) 10 MEQ CR tablet Take 10 mEq by mouth daily.        . pravastatin (PRAVACHOL) 20 MG tablet Take 1 tablet (20 mg total) by mouth daily.  90 tablet  3  . telmisartan-hydrochlorothiazide (MICARDIS HCT) 80-12.5 MG per tablet Take 1 tablet by mouth daily.          No Known Allergies  History  Social History  . Marital Status: Married    Spouse Name: N/A    Number of Children: N/A  . Years of Education: N/A   Occupational History  . Not on file.   Social History Main Topics  . Smoking status: Never Smoker   . Smokeless tobacco: Never Used  . Alcohol Use: No  . Drug Use: No  . Sexually Active:    Other Topics Concern  . Not on file   Social History Narrative   Lives with spouse in [place]    Family History  Problem Relation Age of Onset  . Heart disease Mother   . Heart disease Father     ROS- All systems are reviewed and negative except as per the HPI above  Physical Exam: Filed Vitals:   01/09/11 0847  BP: 132/74  Pulse: 62  Height: 5\' 2"  (1.575 m)  Weight: 188 lb 12.8 oz (85.639 kg)    GEN- The patient is well appearing, alert and oriented x 3 today.   Head- normocephalic, atraumatic Eyes-  Sclera clear, conjunctiva pink Ears- hearing intact Oropharynx- clear Neck- supple, no JVP Lymph- no cervical lymphadenopathy Lungs- Clear to ausculation bilaterally, normal work of  breathing Heart- Regular rate and rhythm, no murmurs, rubs or gallops, PMI not laterally displaced GI- soft, NT, ND, + BS Extremities- no clubbing, cyanosis, or edema MS- no significant deformity or atrophy Skin- no rash or lesion Psych- euthymic mood, full affect Neuro- strength and sensation are intact  EKG today reveals sinus rhythm 62 bpm, otherwise normal ekg  Assessment and Plan:

## 2011-01-26 ENCOUNTER — Encounter (HOSPITAL_COMMUNITY): Payer: Medicare Other | Attending: Internal Medicine

## 2011-01-26 ENCOUNTER — Encounter (HOSPITAL_COMMUNITY): Payer: Medicare Other

## 2011-01-26 ENCOUNTER — Other Ambulatory Visit: Payer: Self-pay | Admitting: Internal Medicine

## 2011-01-26 DIAGNOSIS — D638 Anemia in other chronic diseases classified elsewhere: Secondary | ICD-10-CM | POA: Insufficient documentation

## 2011-01-26 DIAGNOSIS — N2581 Secondary hyperparathyroidism of renal origin: Secondary | ICD-10-CM | POA: Insufficient documentation

## 2011-01-26 DIAGNOSIS — N289 Disorder of kidney and ureter, unspecified: Secondary | ICD-10-CM | POA: Insufficient documentation

## 2011-01-26 LAB — POCT HEMOGLOBIN-HEMACUE: Hemoglobin: 10.3 g/dL — ABNORMAL LOW (ref 12.0–15.0)

## 2011-02-24 LAB — POCT HEMOGLOBIN-HEMACUE: Hemoglobin: 10.2 g/dL — ABNORMAL LOW (ref 12.0–15.0)

## 2011-03-09 ENCOUNTER — Other Ambulatory Visit: Payer: Self-pay | Admitting: Internal Medicine

## 2011-03-09 ENCOUNTER — Encounter (HOSPITAL_COMMUNITY)
Admission: RE | Admit: 2011-03-09 | Discharge: 2011-03-09 | Disposition: A | Discharge status: Home / Self Care | Payer: Medicare Other | Source: Ambulatory Visit | Attending: Internal Medicine | Admitting: Internal Medicine

## 2011-03-09 DIAGNOSIS — N2581 Secondary hyperparathyroidism of renal origin: Secondary | ICD-10-CM | POA: Insufficient documentation

## 2011-03-09 DIAGNOSIS — D638 Anemia in other chronic diseases classified elsewhere: Secondary | ICD-10-CM | POA: Insufficient documentation

## 2011-03-09 DIAGNOSIS — N289 Disorder of kidney and ureter, unspecified: Secondary | ICD-10-CM | POA: Insufficient documentation

## 2011-03-10 LAB — POCT HEMOGLOBIN-HEMACUE: Hemoglobin: 10.4 g/dL — ABNORMAL LOW (ref 12.0–15.0)

## 2011-04-17 ENCOUNTER — Other Ambulatory Visit (HOSPITAL_COMMUNITY): Payer: Self-pay | Admitting: *Deleted

## 2011-04-20 ENCOUNTER — Encounter (HOSPITAL_COMMUNITY)
Admission: RE | Admit: 2011-04-20 | Discharge: 2011-04-20 | Disposition: A | Discharge status: Home / Self Care | Payer: Medicare Other | Source: Ambulatory Visit | Attending: Internal Medicine | Admitting: Internal Medicine

## 2011-04-20 DIAGNOSIS — N289 Disorder of kidney and ureter, unspecified: Secondary | ICD-10-CM | POA: Insufficient documentation

## 2011-04-20 DIAGNOSIS — D638 Anemia in other chronic diseases classified elsewhere: Secondary | ICD-10-CM | POA: Insufficient documentation

## 2011-04-20 DIAGNOSIS — N2581 Secondary hyperparathyroidism of renal origin: Secondary | ICD-10-CM | POA: Insufficient documentation

## 2011-04-20 LAB — POCT HEMOGLOBIN-HEMACUE: Hemoglobin: 9.6 g/dL — ABNORMAL LOW (ref 12.0–15.0)

## 2011-04-20 MED ORDER — EPOETIN ALFA 10000 UNIT/ML IJ SOLN
30000.0000 [IU] | INTRAMUSCULAR | Status: DC
  Administered 2011-04-20: 30000 [IU] via SUBCUTANEOUS

## 2011-04-20 MED ORDER — EPOETIN ALFA 20000 UNIT/ML IJ SOLN
INTRAMUSCULAR | Status: AC
  Filled 2011-04-20: qty 1

## 2011-04-20 MED ORDER — EPOETIN ALFA 10000 UNIT/ML IJ SOLN
INTRAMUSCULAR | Status: AC
  Filled 2011-04-20: qty 1

## 2011-04-26 ENCOUNTER — Encounter: Payer: Self-pay | Admitting: Nurse Practitioner

## 2011-05-01 ENCOUNTER — Ambulatory Visit: Payer: Medicare Other | Admitting: Nurse Practitioner

## 2011-05-02 ENCOUNTER — Ambulatory Visit (INDEPENDENT_AMBULATORY_CARE_PROVIDER_SITE_OTHER): Payer: Medicare Other | Admitting: Nurse Practitioner

## 2011-05-02 ENCOUNTER — Encounter: Payer: Self-pay | Admitting: Nurse Practitioner

## 2011-05-02 VITALS — BP 138/62 | HR 56 | Ht 62.0 in | Wt 190.0 lb

## 2011-05-02 DIAGNOSIS — Z79899 Other long term (current) drug therapy: Secondary | ICD-10-CM

## 2011-05-02 DIAGNOSIS — I10 Essential (primary) hypertension: Secondary | ICD-10-CM

## 2011-05-02 DIAGNOSIS — R942 Abnormal results of pulmonary function studies: Secondary | ICD-10-CM

## 2011-05-02 DIAGNOSIS — I472 Ventricular tachycardia: Secondary | ICD-10-CM

## 2011-05-02 LAB — BASIC METABOLIC PANEL
BUN: 33 mg/dL — ABNORMAL HIGH (ref 6–23)
CO2: 23 mEq/L (ref 19–32)
Calcium: 9.3 mg/dL (ref 8.4–10.5)
Chloride: 107 mEq/L (ref 96–112)
Creatinine, Ser: 1.4 mg/dL — ABNORMAL HIGH (ref 0.4–1.2)
GFR: 37.44 mL/min — ABNORMAL LOW (ref >60.00)
Glucose, Bld: 165 mg/dL — ABNORMAL HIGH (ref 70–99)
Potassium: 4.5 mEq/L (ref 3.5–5.1)
Sodium: 140 mEq/L (ref 135–145)

## 2011-05-02 LAB — CBC WITH DIFFERENTIAL/PLATELET
Basophils Absolute: 0 10*3/uL (ref 0.0–0.1)
Basophils Relative: 0.4 % (ref 0.0–3.0)
Eosinophils Absolute: 0.1 10*3/uL (ref 0.0–0.7)
Eosinophils Relative: 1.7 % (ref 0.0–5.0)
HCT: 29.8 % — ABNORMAL LOW (ref 36.0–46.0)
Hemoglobin: 10 g/dL — ABNORMAL LOW (ref 12.0–15.0)
Lymphocytes Relative: 34.3 % (ref 12.0–46.0)
Lymphs Abs: 2.2 10*3/uL (ref 0.7–4.0)
MCHC: 33.4 g/dL (ref 30.0–36.0)
MCV: 91.7 fl (ref 78.0–100.0)
Monocytes Absolute: 0.5 10*3/uL (ref 0.1–1.0)
Monocytes Relative: 7.6 % (ref 3.0–12.0)
Neutro Abs: 3.5 10*3/uL (ref 1.4–7.7)
Neutrophils Relative %: 56 % (ref 43.0–77.0)
Platelets: 165 10*3/uL (ref 150.0–400.0)
RBC: 3.25 Mil/uL — ABNORMAL LOW (ref 3.87–5.11)
RDW: 17.8 % — ABNORMAL HIGH (ref 11.5–14.6)
WBC: 6.3 10*3/uL (ref 4.5–10.5)

## 2011-05-02 LAB — TSH: TSH: 3.49 u[IU]/mL (ref 0.35–5.50)

## 2011-05-02 LAB — HEPATIC FUNCTION PANEL
ALT: 18 U/L (ref 0–35)
AST: 27 U/L (ref 0–37)
Albumin: 3.2 g/dL — ABNORMAL LOW (ref 3.5–5.2)
Alkaline Phosphatase: 73 U/L (ref 39–117)
Bilirubin, Direct: 0 mg/dL (ref 0.0–0.3)
Total Bilirubin: 0.6 mg/dL (ref 0.3–1.2)
Total Protein: 7.3 g/dL (ref 6.0–8.3)

## 2011-05-02 MED ORDER — LEVOTHYROXINE SODIUM 25 MCG PO TABS: ORAL_TABLET | ORAL | Status: DC

## 2011-05-02 MED ORDER — HYDRALAZINE HCL 25 MG PO TABS: 25.0000 mg | ORAL_TABLET | Freq: Two times a day (BID) | ORAL | Status: DC

## 2011-05-02 MED ORDER — OMEGA-3-ACID ETHYL ESTERS 1 G PO CAPS: 2.0000 g | ORAL_CAPSULE | Freq: Two times a day (BID) | ORAL | Status: DC

## 2011-05-02 MED ORDER — POTASSIUM CHLORIDE 10 MEQ PO TBCR: 10.0000 meq | EXTENDED_RELEASE_TABLET | Freq: Every day | ORAL | Status: DC

## 2011-05-02 NOTE — Assessment & Plan Note (Signed)
Blood pressure looks ok. No change with her current medicines.

## 2011-05-02 NOTE — Assessment & Plan Note (Signed)
No complaints of shortness of breath. Will continue to follow her clinically.

## 2011-05-02 NOTE — Progress Notes (Signed)
Leslie Coleman Date of Birth: Dec 06, 1928 Medical Record [id number]  History of Present Illness: Leslie Coleman is seen today for a follow up visit. She is seen for Dr. Rayann Coleman. She is a former patient of Dr. Susa Coleman. She is 77 years of age. Has history of remote [facility]. Initially treated with procainamide but switched to amiodarone in 2008 due to unavailability of medicine. She does have chronically abnormal PFT's. No shortness of breath reported. She remains active and reports that she is doing well. She will be turning 83 here in just a few weeks. She denies palpitations, chest pain, shortness of breath or dizzy spells.   Current Outpatient Prescriptions on File Prior to Visit  Medication Sig Dispense Refill  . acetaminophen (TYLENOL) 500 MG tablet Take 500 mg by mouth as needed.        Marland Kitchen allopurinol (ZYLOPRIM) 300 MG tablet Take 1 tablet (300 mg total) by mouth daily.  90 tablet  3  . amiodarone (PACERONE) 200 MG tablet Take 200 mg by mouth daily.        Marland Kitchen amLODipine (NORVASC) 10 MG tablet Take 10 mg by mouth daily.        Marland Kitchen atenolol (TENORMIN) 100 MG tablet Take 100 mg by mouth 2 (two) times daily.        Marland Kitchen Epoetin Alfa (PROCRIT IJ) Inject as directed. As directed       . glyBURIDE-metformin (GLUCOVANCE) 5-500 MG per tablet Take 1 tablet by mouth 2 (two) times daily with a meal.        . Iron-Vitamins (GERITOL PO) Take by mouth at bedtime.        . Multiple Vitamins-Iron (MULTIVITAMINS WITH IRON) TABS Take 1 tablet by mouth daily.        . perindopril (ACEON) 8 MG tablet Take 8 mg by mouth 2 (two) times daily.        . pioglitazone (ACTOS) 30 MG tablet Take 15 mg by mouth daily.       . pravastatin (PRAVACHOL) 20 MG tablet Take 1 tablet (20 mg total) by mouth daily.  90 tablet  3  . telmisartan-hydrochlorothiazide (MICARDIS HCT) 80-12.5 MG per tablet Take 1 tablet by mouth daily.        Marland Kitchen DISCONTD: hydrALAZINE (APRESOLINE) 25 MG tablet Take 25 mg by mouth 2 (two) times daily.        Marland Kitchen DISCONTD:  levothyroxine (SYNTHROID, LEVOTHROID) 25 MCG tablet Take 1 tablet (25 mcg total) by mouth daily. Taking one tablet days per week and two tablets two days per week  10 tablet  0  . DISCONTD: omega-3 acid ethyl esters (LOVAZA) 1 G capsule Take 2 g by mouth 2 (two) times daily.        Marland Kitchen DISCONTD: potassium chloride (KLOR-CON) 10 MEQ CR tablet Take 10 mEq by mouth daily.          No Known Allergies  Past Medical History  Diagnosis Date  . V-tach     remote VT, treated initially with procainamide, on amiodarone since 2008  . HTN (hypertension)     Last echo in 2004; EF normal  . Hyperlipidemia   . Diabetes mellitus   . Obesity   . Hypothyroidism     on replacement  . Anemia     treated with Procrit  . Gout   . Abnormal PFTs (pulmonary function tests) 2008    No obstruction noted. Has been followed clinically.  . High risk medication use  on amiodarone.    Past Surgical History  Procedure Date  . Cardioversion 1978  . Cesarean section     x 3  . Abdominal hysterectomy   . US echocardiography 12/29/2002    EF 60-65%  . Cardiovascular stress test 07/07/2003    EF 70%. NO EVIDENCE OF ISCHEMIA    History  Smoking status  . Never Smoker   Smokeless tobacco  . Never Used    History  Alcohol Use No    Family History  Problem Relation Age of Onset  . Heart disease Mother   . Heart disease Father     Review of Systems: The review of systems is per the HPI.  She needs her medicines refilled. All other systems were reviewed and are negative.  Physical Exam: BP 138/62  Pulse 56  Ht 5\' 2"  (1.575 m)  Wt 190 lb (86.183 kg)  BMI 34.75 kg/m2 Patient is very pleasant and in no acute distress. She looks younger than her stated age. Skin is warm and dry. Color is chronically sallow and pale.  HEENT is unremarkable. Normocephalic/atraumatic. PERRL. Sclera are nonicteric. Neck is supple. No masses. No JVD. Lungs are clear. Cardiac exam shows a regular rate and rhythm. Abdomen is  soft. Extremities are without edema. Gait and ROM are intact. No gross neurologic deficits noted.  LABORATORY DATA: PENDING.   Assessment / Plan:

## 2011-05-02 NOTE — Patient Instructions (Signed)
I think you are doing well.  We will be checking your labs today.  I have refilled your medicines.  We will see you back in 4 months.

## 2011-05-02 NOTE — Assessment & Plan Note (Signed)
She is doing well with her current medical therapy. I have refilled her medicines today. We are checking labs today in follow up of her amiodarone. We will see her back in 4 months. Patient is agreeable to this plan and will call if any problems develop in the interim.

## 2011-05-24 DIAGNOSIS — E1139 Type 2 diabetes mellitus with other diabetic ophthalmic complication: Secondary | ICD-10-CM | POA: Diagnosis not present

## 2011-05-24 DIAGNOSIS — H353190 Nonexudative age-related macular degeneration, unspecified eye, stage unspecified: Secondary | ICD-10-CM | POA: Diagnosis not present

## 2011-05-24 DIAGNOSIS — E11329 Type 2 diabetes mellitus with mild nonproliferative diabetic retinopathy without macular edema: Secondary | ICD-10-CM | POA: Diagnosis not present

## 2011-06-01 ENCOUNTER — Encounter (HOSPITAL_COMMUNITY)
Admission: RE | Admit: 2011-06-01 | Discharge: 2011-06-01 | Disposition: A | Discharge status: Home / Self Care | Payer: Medicare Other | Source: Ambulatory Visit | Attending: Internal Medicine | Admitting: Internal Medicine

## 2011-06-01 DIAGNOSIS — N289 Disorder of kidney and ureter, unspecified: Secondary | ICD-10-CM | POA: Insufficient documentation

## 2011-06-01 DIAGNOSIS — D638 Anemia in other chronic diseases classified elsewhere: Secondary | ICD-10-CM | POA: Insufficient documentation

## 2011-06-01 DIAGNOSIS — N2581 Secondary hyperparathyroidism of renal origin: Secondary | ICD-10-CM | POA: Diagnosis not present

## 2011-06-01 LAB — POCT HEMOGLOBIN-HEMACUE: Hemoglobin: 10 g/dL — ABNORMAL LOW (ref 12.0–15.0)

## 2011-06-01 MED ORDER — EPOETIN ALFA 10000 UNIT/ML IJ SOLN
INTRAMUSCULAR | Status: AC
  Filled 2011-06-01: qty 1

## 2011-06-01 MED ORDER — EPOETIN ALFA 20000 UNIT/ML IJ SOLN
INTRAMUSCULAR | Status: AC
  Filled 2011-06-01: qty 1

## 2011-06-01 MED ORDER — EPOETIN ALFA 10000 UNIT/ML IJ SOLN
30000.0000 [IU] | INTRAMUSCULAR | Status: DC
  Administered 2011-06-01: 30000 [IU] via SUBCUTANEOUS

## 2011-06-05 ENCOUNTER — Other Ambulatory Visit: Payer: Self-pay | Admitting: *Deleted

## 2011-07-06 ENCOUNTER — Emergency Department (HOSPITAL_COMMUNITY): Payer: Medicare Other

## 2011-07-06 ENCOUNTER — Inpatient Hospital Stay (HOSPITAL_COMMUNITY)
Admission: EM | Admit: 2011-07-06 | Discharge: 2011-07-07 | DRG: 149 | Disposition: A | Discharge status: Home / Self Care | Payer: Medicare Other | Attending: Internal Medicine | Admitting: Internal Medicine

## 2011-07-06 ENCOUNTER — Encounter (HOSPITAL_COMMUNITY): Payer: Self-pay | Admitting: Emergency Medicine

## 2011-07-06 DIAGNOSIS — I6350 Cerebral infarction due to unspecified occlusion or stenosis of unspecified cerebral artery: Secondary | ICD-10-CM | POA: Diagnosis not present

## 2011-07-06 DIAGNOSIS — G319 Degenerative disease of nervous system, unspecified: Secondary | ICD-10-CM | POA: Diagnosis not present

## 2011-07-06 DIAGNOSIS — R42 Dizziness and giddiness: Principal | ICD-10-CM | POA: Diagnosis present

## 2011-07-06 DIAGNOSIS — E119 Type 2 diabetes mellitus without complications: Secondary | ICD-10-CM | POA: Diagnosis not present

## 2011-07-06 DIAGNOSIS — R942 Abnormal results of pulmonary function studies: Secondary | ICD-10-CM | POA: Diagnosis present

## 2011-07-06 DIAGNOSIS — J9819 Other pulmonary collapse: Secondary | ICD-10-CM | POA: Diagnosis not present

## 2011-07-06 DIAGNOSIS — M109 Gout, unspecified: Secondary | ICD-10-CM | POA: Diagnosis not present

## 2011-07-06 DIAGNOSIS — I639 Cerebral infarction, unspecified: Secondary | ICD-10-CM

## 2011-07-06 DIAGNOSIS — E032 Hypothyroidism due to medicaments and other exogenous substances: Secondary | ICD-10-CM | POA: Diagnosis not present

## 2011-07-06 DIAGNOSIS — Z8679 Personal history of other diseases of the circulatory system: Secondary | ICD-10-CM | POA: Diagnosis present

## 2011-07-06 DIAGNOSIS — R918 Other nonspecific abnormal finding of lung field: Secondary | ICD-10-CM | POA: Diagnosis not present

## 2011-07-06 DIAGNOSIS — I472 Ventricular tachycardia, unspecified: Secondary | ICD-10-CM | POA: Diagnosis present

## 2011-07-06 DIAGNOSIS — I519 Heart disease, unspecified: Secondary | ICD-10-CM | POA: Diagnosis present

## 2011-07-06 DIAGNOSIS — E860 Dehydration: Secondary | ICD-10-CM | POA: Diagnosis present

## 2011-07-06 DIAGNOSIS — I2510 Atherosclerotic heart disease of native coronary artery without angina pectoris: Secondary | ICD-10-CM | POA: Diagnosis not present

## 2011-07-06 DIAGNOSIS — I6340 Cerebral infarction due to embolism of unspecified cerebral artery: Secondary | ICD-10-CM | POA: Diagnosis not present

## 2011-07-06 DIAGNOSIS — I10 Essential (primary) hypertension: Secondary | ICD-10-CM | POA: Diagnosis present

## 2011-07-06 DIAGNOSIS — E782 Mixed hyperlipidemia: Secondary | ICD-10-CM | POA: Diagnosis not present

## 2011-07-06 DIAGNOSIS — E785 Hyperlipidemia, unspecified: Secondary | ICD-10-CM

## 2011-07-06 DIAGNOSIS — I4729 Other ventricular tachycardia: Secondary | ICD-10-CM | POA: Diagnosis present

## 2011-07-06 DIAGNOSIS — E039 Hypothyroidism, unspecified: Secondary | ICD-10-CM | POA: Diagnosis present

## 2011-07-06 LAB — COMPREHENSIVE METABOLIC PANEL
AST: 24 U/L (ref 0–37)
Albumin: 3.2 g/dL — ABNORMAL LOW (ref 3.5–5.2)
Alkaline Phosphatase: 76 U/L (ref 39–117)
Chloride: 105 mEq/L (ref 96–112)
Creatinine, Ser: 1.09 mg/dL (ref 0.50–1.10)
Potassium: 4.1 mEq/L (ref 3.5–5.1)
Total Bilirubin: 0.3 mg/dL (ref 0.3–1.2)
Total Protein: 7.4 g/dL (ref 6.0–8.3)

## 2011-07-06 LAB — DIFFERENTIAL
Basophils Absolute: 0 10*3/uL (ref 0.0–0.1)
Basophils Relative: 0 % (ref 0–1)
Eosinophils Absolute: 0.1 10*3/uL (ref 0.0–0.7)
Neutro Abs: 3.7 10*3/uL (ref 1.7–7.7)
Neutrophils Relative %: 61 % (ref 43–77)

## 2011-07-06 LAB — URINALYSIS, ROUTINE W REFLEX MICROSCOPIC
Glucose, UA: NEGATIVE mg/dL
Ketones, ur: NEGATIVE mg/dL
Nitrite: NEGATIVE
Protein, ur: 100 mg/dL — AB

## 2011-07-06 LAB — CBC
MCHC: 32.4 g/dL (ref 30.0–36.0)
RDW: 16.7 % — ABNORMAL HIGH (ref 11.5–15.5)

## 2011-07-06 LAB — CK TOTAL AND CKMB (NOT AT ARMC)
Relative Index: INVALID (ref 0.0–2.5)
Total CK: 44 U/L (ref 7–177)

## 2011-07-06 LAB — URINE MICROSCOPIC-ADD ON

## 2011-07-06 NOTE — ED Notes (Signed)
12 lead EKG given to Dr. Jeanell Sparrow

## 2011-07-06 NOTE — ED Notes (Signed)
[id number] Leslie Coleman [id number],  Leslie Coleman  [id number]

## 2011-07-06 NOTE — ED Provider Notes (Cosign Needed)
History     CSN: [id number]  Arrival date & time 07/06/11  1730   First MD Initiated Contact with Patient 07/06/11 1928      Chief Complaint  Patient presents with  . Dizziness    Pt stated that she was dizzy and almost blacked out and was weak    (Consider location/radiation/quality/duration/timing/severity/associated sxs/prior treatment) HPI  Patient with history of dm, v-tach, c.o. Being dizzy today about 130 pm. Today.  States she fixed husband sandwich, and felt fine, ate lunch, laid down, got up and felt like she was going to fall.  Able to walk with walker.  No nausea, no weakness in arms or legs, felt shaky.  Symptoms lasted about 15-20 minutes.  Now back to normal.  PMD- Ramashandran,  Card Allred.   Past Medical History  Diagnosis Date  . V-tach     remote VT, treated initially with procainamide, on amiodarone since 2008  . HTN (hypertension)     Last echo in 2004; EF normal  . Hyperlipidemia   . Diabetes mellitus   . Obesity   . Hypothyroidism     on replacement  . Anemia     treated with Procrit  . Gout   . Abnormal PFTs (pulmonary function tests) 2008    No obstruction noted. Has been followed clinically.  . High risk medication use     on amiodarone.    Past Surgical History  Procedure Date  . Cardioversion 1978  . Cesarean section     x 3  . Abdominal hysterectomy   . US echocardiography 12/29/2002    EF 60-65%  . Cardiovascular stress test 07/07/2003    EF 70%. NO EVIDENCE OF ISCHEMIA    Family History  Problem Relation Age of Onset  . Heart disease Mother   . Heart disease Father     History  Substance Use Topics  . Smoking status: Never Smoker   . Smokeless tobacco: Never Used  . Alcohol Use: No    OB History    Grav Para Term Preterm Abortions TAB SAB Ect Mult Living                  Review of Systems  Respiratory: Negative for shortness of breath.   Cardiovascular: Negative for chest pain and leg swelling.  Genitourinary:  Negative for dysuria and urgency.  All other systems reviewed and are negative.    Allergies  Review of patient's allergies indicates no known allergies.  Home Medications   Current Outpatient Rx  Name Route Sig Dispense Refill  . ACETAMINOPHEN 500 MG PO TABS Oral Take 500 mg by mouth as needed.      . ALLOPURINOL 300 MG PO TABS Oral Take 1 tablet (300 mg total) by mouth daily. 90 tablet 3  . AMIODARONE HCL 200 MG PO TABS Oral Take 200 mg by mouth daily.      Marland Kitchen AMLODIPINE BESYLATE 10 MG PO TABS Oral Take 10 mg by mouth daily.      . ATENOLOL 100 MG PO TABS Oral Take 100 mg by mouth 2 (two) times daily.      Marland Kitchen PROCRIT IJ Injection Inject as directed. As directed     . GLYBURIDE-METFORMIN 5-500 MG PO TABS Oral Take 1 tablet by mouth 2 (two) times daily with a meal.      . HYDRALAZINE HCL 25 MG PO TABS Oral Take 1 tablet (25 mg total) by mouth 2 (two) times daily. 180 tablet 3  .  GERITOL PO Oral Take by mouth at bedtime.      Marland Kitchen LEVOTHYROXINE SODIUM 25 MCG PO TABS Oral Take 25 mcg by mouth as directed. Taking one tablet daily Monday-Friday  and two tablets daily Saturday and sunday    . TAB-A-VITE/IRON PO TABS Oral Take 1 tablet by mouth daily.      . OMEGA-3-ACID ETHYL ESTERS 1 G PO CAPS Oral Take 2 capsules (2 g total) by mouth 2 (two) times daily. 360 capsule 3  . PERINDOPRIL ERBUMINE 8 MG PO TABS Oral Take 8 mg by mouth 2 (two) times daily.      Marland Kitchen PIOGLITAZONE HCL 30 MG PO TABS Oral Take 15 mg by mouth daily.     Marland Kitchen POTASSIUM CHLORIDE 10 MEQ PO TBCR Oral Take 1 tablet (10 mEq total) by mouth daily. 90 tablet 3  . PRAVASTATIN SODIUM 20 MG PO TABS Oral Take 1 tablet (20 mg total) by mouth daily. 90 tablet 3  . TELMISARTAN-HCTZ 80-12.5 MG PO TABS Oral Take 1 tablet by mouth daily.        BP 166/65  Pulse 58  Temp(Src) 98.4 F (36.9 C) (Oral)  Resp 18  Ht 5\' 2"  (1.575 m)  Wt 182 lb (82.555 kg)  BMI 33.29 kg/m2  SpO2 97%  Physical Exam  Nursing note and vitals  reviewed. Constitutional: She is oriented to person, place, and time. She appears well-developed and well-nourished.  HENT:  Head: Normocephalic and atraumatic.  Right Ear: External ear normal.  Left Ear: External ear normal.  Nose: Nose normal.  Mouth/Throat: Oropharynx is clear and moist.  Eyes: Conjunctivae are normal. Pupils are equal, round, and reactive to light.  Neck: Normal range of motion. Neck supple.  Cardiovascular: Normal rate and normal heart sounds.   Pulmonary/Chest: Effort normal and breath sounds normal.  Abdominal: Soft. Bowel sounds are normal.  Musculoskeletal: Normal range of motion.  Neurological: She is alert and oriented to person, place, and time. She has normal strength and normal reflexes. She displays a negative Romberg sign.  Skin: Skin is warm.       Area of erythema right forearm  Psychiatric: She has a normal mood and affect. Her behavior is normal. Thought content normal.    ED Course  Procedures (including critical care time)  Labs Reviewed - No data to display No results found.   No diagnosis found.  Dg Chest 2 View  07/06/2011  *RADIOLOGY REPORT*  Clinical Data: Dizziness, weakness, diabetes  CHEST - 2 VIEW  Comparison: 10/08/2006  Findings: Unchanged borderline enlarged cardiac silhouette and mediastinal contours.  Decreased lung volumes with basilar opacities favored to represent atelectasis.  No definite pleural effusion or pneumothorax.  No acute osseous abnormalities.  IMPRESSION: Decreased lung volumes with basilar opacities favored to represent atelectasis.  Original Report Authenticated By: Rachel Moulds, M.D.   Ct Head Wo Contrast  07/06/2011  *RADIOLOGY REPORT*  Clinical Data: Dizziness.  CT HEAD WITHOUT CONTRAST  Technique:  Contiguous axial images were obtained from the base of the skull through the vertex without contrast.  Comparison: None  Findings: There is age related cerebral atrophy, ventriculomegaly and periventricular white  matter disease.  The area of vague low attenuation in the left occipital lobe suspicious for infarction. No hemorrhage.  No mass.  No extra-axial fluid collections.  The brainstem and cerebellum grossly normal. Possible small calcified meningioma in the left high posterior parietal area.  The bony structures are intact.  Hyperostosis frontalis interna is  noted.  The paranasal sinuses are clear.  Chronic-appearing mastoid hypoaeration is noted.  IMPRESSION:  1.  Findings suspicious for an acute left occipital infarction.  No hemorrhage. 2.  Age related cerebral atrophy, ventriculomegaly and periventricular white matter disease.  Original Report Authenticated By: P. Kalman Jewels, M.D.   Date: 07/06/2011  Rate: 60  Rhythm: normal sinus rhythm with pvcs  QRS Axis: normal  Intervals: normal  ST/T Wave abnormalities: normal  Conduction Disutrbances:none  Narrative Interpretation:   Old EKG Reviewed: none available CRITICAL CARE Performed by: Shaune Pollack   Total critical care time:45  Critical care time was exclusive of separately billable procedures and treating other patients.  Critical care was necessary to treat or prevent imminent or life-threatening deterioration.  Critical care was time spent personally by me on the following activities: development of treatment plan with patient and/or surrogate as well as nursing, discussions with consultants, evaluation of patient's response to treatment, examination of patient, obtaining history from patient or surrogate, ordering and performing treatments and interventions, ordering and review of laboratory studies, ordering and review of radiographic studies, pulse oximetry and re-evaluation of patient's condition.     MDM  CT results reviewed.  Neurology paged.  9:48 PM Discussed with Dr. Monica Becton is on for stroke.   Patient currently with greater than 8 hours since symptom onset.  Discussed with Dr. Claria Dice and she will see here with plans to  transfer patient to hospitalist at [facility] and have patient on stroke service.       Shaune Pollack, MD 07/06/11 [id number]  Shaune Pollack, MD 07/06/11 [place], MD 07/06/11 2242

## 2011-07-06 NOTE — H&P (Signed)
PCP:   Dr. Ashby Dawes  Chief Complaint:  Dizziness  HPI: This is a 78 year old female who today at approximately 1:30PM developed dizziness. These symptoms persisted until approximately 2:30 PM. she was not quite feeling well, therefore she to her son when he came home from work after 5 PM and he brought her to the ER. She doesn't report any headaches, no blurred vision, no facial drooping, no slurred speech, no localized weakness and. She does have a history of hypertension but it is well-controlled. Per her son she was a bit unsteady in her feet when he took her into the ER but otherwise she is at her baseline. In the ER on the CVA assessment she has a mild left lower extremity drift but otherwise normal. The family reports no altered mental status. History provided by the son and patient.  Review of Systems: positives bolded  anorexia, fever, weight loss,, vision loss, decreased hearing, hoarseness, chest pain, syncope, dyspnea on exertion, peripheral edema, balance deficits, hemoptysis, abdominal pain, melena, hematochezia, severe indigestion/heartburn, hematuria, incontinence, genital sores, muscle weakness, suspicious skin lesions, transient blindness, difficulty walking, depression, unusual weight change, abnormal bleeding, enlarged lymph nodes, angioedema, and breast masses.  Past Medical History: Past Medical History  Diagnosis Date  . V-tach     remote VT, treated initially with procainamide, on amiodarone since 2008  . HTN (hypertension)     Last echo in 2004; EF normal  . Hyperlipidemia   . Diabetes mellitus   . Obesity   . Hypothyroidism     on replacement  . Anemia     treated with Procrit  . Gout   . Abnormal PFTs (pulmonary function tests) 2008    No obstruction noted. Has been followed clinically.  . High risk medication use     on amiodarone.   Past Surgical History  Procedure Date  . Cardioversion 1978  . Cesarean section     x 3  . Abdominal hysterectomy     . US echocardiography 12/29/2002    EF 60-65%  . Cardiovascular stress test 07/07/2003    EF 70%. NO EVIDENCE OF ISCHEMIA    Medications: Prior to Admission medications   Medication Sig Start Date End Date Taking? Authorizing Provider  acetaminophen (TYLENOL) 500 MG tablet Take 500 mg by mouth as needed.     Yes Historical Provider, MD  allopurinol (ZYLOPRIM) 300 MG tablet Take 1 tablet (300 mg total) by mouth daily. 08/29/10  Yes Burtis Junes, NP  amiodarone (PACERONE) 200 MG tablet Take 200 mg by mouth daily.     Yes Historical Provider, MD  amLODipine (NORVASC) 10 MG tablet Take 10 mg by mouth daily.     Yes Historical Provider, MD  atenolol (TENORMIN) 100 MG tablet Take 100 mg by mouth 2 (two) times daily.     Yes Historical Provider, MD  Epoetin Alfa (PROCRIT IJ) Inject 30,000 Units as directed every 6 (six) weeks. As directed every 6 weeks.   Yes Historical Provider, MD  glyBURIDE-metformin (GLUCOVANCE) 5-500 MG per tablet Take 1 tablet by mouth 2 (two) times daily with a meal.     Yes Historical Provider, MD  hydrALAZINE (APRESOLINE) 25 MG tablet Take 1 tablet (25 mg total) by mouth 2 (two) times daily. 05/02/11  Yes Burtis Junes, NP  Iron-Vitamins (GERITOL PO) Take by mouth every morning.    Yes Historical Provider, MD  levothyroxine (SYNTHROID, LEVOTHROID) 25 MCG tablet Take 25 mcg by mouth as directed. Taking one tablet daily  Monday-Friday  and two tablets daily Saturday and sunday 05/02/11  Yes Burtis Junes, NP  Multiple Vitamins-Iron (MULTIVITAMINS WITH IRON) TABS Take 1 tablet by mouth daily.     Yes Historical Provider, MD  omega-3 acid ethyl esters (LOVAZA) 1 G capsule Take 2 capsules (2 g total) by mouth 2 (two) times daily. 05/02/11  Yes Burtis Junes, NP  perindopril (ACEON) 8 MG tablet Take 8 mg by mouth 2 (two) times daily.     Yes Historical Provider, MD  Pioglitazone HCl (ACTOS PO) Take 1 tablet by mouth daily.   Yes Historical Provider, MD  potassium chloride  (KLOR-CON) 10 MEQ CR tablet Take 1 tablet (10 mEq total) by mouth daily. 05/02/11  Yes Burtis Junes, NP  pravastatin (PRAVACHOL) 20 MG tablet Take 1 tablet (20 mg total) by mouth daily. 08/29/10  Yes Burtis Junes, NP  telmisartan-hydrochlorothiazide (MICARDIS HCT) 80-12.5 MG per tablet Take 1 tablet by mouth daily.     Yes Historical Provider, MD    Allergies:  No Known Allergies  Social History:  reports that she has never smoked. She has never used smokeless tobacco. She reports that she does not drink alcohol or use illicit drugs.  Family History: Family History  Problem Relation Age of Onset  . Heart disease Mother   . Heart disease Father     Physical Exam: Filed Vitals:   07/06/11 1834 07/06/11 2015 07/06/11 2015 07/06/11 2015  BP: 166/65 127/94 132/47 119/46  Pulse: 58 61 62 75  Temp: 98.4 F (36.9 C)     TempSrc: Oral     Resp: 18     Height: 5\' 2"  (1.575 m)     Weight: 82.555 kg (182 lb)     SpO2: 97% 100% 100% 100%    General:  Alert and oriented times three, well developed and nourished, no acute distress Eyes: PERRLA, pink conjunctiva, no scleral icterus ENT: Moist oral mucosa, neck supple, no thyromegaly Lungs: clear to ascultation, no wheeze, no crackles, no use of accessory muscles Cardiovascular: regular rate and rhythm, no regurgitation, no gallops, no murmurs. No carotid bruits, no JVD Abdomen: soft, positive BS, non-tender, non-distended, no organomegaly, not an acute abdomen GU: not examined Neuro: CN II - XII grossly intact, sensation intact Musculoskeletal: strength 5/5 all extremities, no clubbing, cyanosis or edema Skin: no rash, no subcutaneous crepitation, no decubitus Psych: appropriate patient   Labs on Admission:   [facility] 07/06/11 2055  NA 138  K 4.1  CL 105  CO2 20  GLUCOSE 188*  BUN 28*  CREATININE 1.09  CALCIUM 9.9  MG --  PHOS --    Basename 07/06/11 2055  AST 24  ALT 16  ALKPHOS 76  BILITOT 0.3  PROT 7.4    ALBUMIN 3.2*   No results found for this basename: LIPASE:2,AMYLASE:2 in the last 72 hours  Basename 07/06/11 2055  WBC 6.0  NEUTROABS 3.7  HGB 9.4*  HCT 29.0*  MCV 86.6  PLT 121*    Basename 07/06/11 2055  CKTOTAL 44  CKMB 2.4  CKMBINDEX --  TROPONINI <0.30   No components found with this basename: POCBNP:3 No results found for this basename: DDIMER:2 in the last 72 hours No results found for this basename: HGBA1C:2 in the last 72 hours No results found for this basename: CHOL:2,HDL:2,LDLCALC:2,TRIG:2,CHOLHDL:2,LDLDIRECT:2 in the last 72 hours No results found for this basename: TSH,T4TOTAL,FREET3,T3FREE,THYROIDAB in the last 72 hours No results found for this basename: VITAMINB12:2,FOLATE:2,FERRITIN:2,TIBC:2,IRON:2,RETICCTPCT:2 in the last  72 hours  Micro Results: No results found for this or any previous visit (from the past 240 hour(s)). Results for Leslie Coleman, Leslie Coleman (MRN [id number]) as of 07/06/2011 23:11  Ref. Range 07/06/2011 21:16  Color, Urine Latest Range: YELLOW  YELLOW  APPearance Latest Range: CLEAR  CLEAR  Specific Gravity, Urine Latest Range: 1.005-1.030  1.014  pH Latest Range: 5.0-8.0  6.0  Glucose, UA Latest Range: NEGATIVE mg/dL NEGATIVE  Bilirubin Urine Latest Range: NEGATIVE  NEGATIVE  Ketones, ur Latest Range: NEGATIVE mg/dL NEGATIVE  Protein Latest Range: NEGATIVE mg/dL 100 (A)  Urobilinogen, UA Latest Range: 0.0-1.0 mg/dL 0.2  Nitrite Latest Range: NEGATIVE  NEGATIVE  Leukocytes, UA Latest Range: NEGATIVE  SMALL (A)  Hgb urine dipstick Latest Range: NEGATIVE  MODERATE (A)  Urine-Other No range found MUCOUS PRESENT  WBC, UA Latest Range: <3 WBC/hpf 11-20  RBC / HPF Latest Range: <3 RBC/hpf 7-10  Squamous Epithelial / LPF Latest Range: RARE  RARE  Bacteria, UA Latest Range: RARE  MANY (A)    Radiological Exams on Admission: Dg Chest 2 View  07/06/2011  *RADIOLOGY REPORT*  Clinical Data: Dizziness, weakness, diabetes  CHEST - 2 VIEW  Comparison:  10/08/2006  Findings: Unchanged borderline enlarged cardiac silhouette and mediastinal contours.  Decreased lung volumes with basilar opacities favored to represent atelectasis.  No definite pleural effusion or pneumothorax.  No acute osseous abnormalities.  IMPRESSION: Decreased lung volumes with basilar opacities favored to represent atelectasis.  Original Report Authenticated By: Rachel Moulds, M.D.   Ct Head Wo Contrast  07/06/2011  *RADIOLOGY REPORT*  Clinical Data: Dizziness.  CT HEAD WITHOUT CONTRAST  Technique:  Contiguous axial images were obtained from the base of the skull through the vertex without contrast.  Comparison: None  Findings: There is age related cerebral atrophy, ventriculomegaly and periventricular white matter disease.  The area of vague low attenuation in the left occipital lobe suspicious for infarction. No hemorrhage.  No mass.  No extra-axial fluid collections.  The brainstem and cerebellum grossly normal. Possible small calcified meningioma in the left high posterior parietal area.  The bony structures are intact.  Hyperostosis frontalis interna is noted.  The paranasal sinuses are clear.  Chronic-appearing mastoid hypoaeration is noted.  IMPRESSION:  1.  Findings suspicious for an acute left occipital infarction.  No hemorrhage. 2.  Age related cerebral atrophy, ventriculomegaly and periventricular white matter disease.  Original Report Authenticated By: P. Kalman Jewels, M.D.   EKG:   Assessment/Plan Present on Admission:  .CVA (cerebral infarction) Admit to step down Aspirin ordered CV protocol, including MRI [place], carotid ultrasound, lipid panel,  Patient has high blood pressure but this is well controlled, will order full parameters and blood pressure medications for some permissive and hypertension. IV fluid hydration and total evaluation ordered Patient with minimal neurological deficit   patient was out of the window for thrombolytic therapy, her symptoms  have resolved  The ER physician has discussed the patient with Dr. Costella Hatcher neurologist at [facility]  .HTN (hypertension) .V-tach .Hyperlipidemia .Gout Stable resume home medications   Full code DVT prophylaxis Team 1  Dmonte Maher 07/06/2011, 11:11 PM

## 2011-07-07 ENCOUNTER — Inpatient Hospital Stay (HOSPITAL_COMMUNITY): Payer: Medicare Other

## 2011-07-07 ENCOUNTER — Other Ambulatory Visit: Payer: Self-pay

## 2011-07-07 DIAGNOSIS — E119 Type 2 diabetes mellitus without complications: Secondary | ICD-10-CM | POA: Diagnosis present

## 2011-07-07 DIAGNOSIS — M109 Gout, unspecified: Secondary | ICD-10-CM | POA: Diagnosis present

## 2011-07-07 DIAGNOSIS — I6350 Cerebral infarction due to unspecified occlusion or stenosis of unspecified cerebral artery: Secondary | ICD-10-CM | POA: Diagnosis not present

## 2011-07-07 DIAGNOSIS — I10 Essential (primary) hypertension: Secondary | ICD-10-CM | POA: Diagnosis present

## 2011-07-07 DIAGNOSIS — G319 Degenerative disease of nervous system, unspecified: Secondary | ICD-10-CM | POA: Diagnosis not present

## 2011-07-07 DIAGNOSIS — R42 Dizziness and giddiness: Secondary | ICD-10-CM | POA: Diagnosis not present

## 2011-07-07 DIAGNOSIS — I519 Heart disease, unspecified: Secondary | ICD-10-CM | POA: Diagnosis present

## 2011-07-07 DIAGNOSIS — G459 Transient cerebral ischemic attack, unspecified: Secondary | ICD-10-CM

## 2011-07-07 DIAGNOSIS — E860 Dehydration: Secondary | ICD-10-CM | POA: Diagnosis present

## 2011-07-07 DIAGNOSIS — E032 Hypothyroidism due to medicaments and other exogenous substances: Secondary | ICD-10-CM | POA: Diagnosis not present

## 2011-07-07 DIAGNOSIS — I2510 Atherosclerotic heart disease of native coronary artery without angina pectoris: Secondary | ICD-10-CM | POA: Diagnosis not present

## 2011-07-07 DIAGNOSIS — E782 Mixed hyperlipidemia: Secondary | ICD-10-CM | POA: Diagnosis not present

## 2011-07-07 DIAGNOSIS — E039 Hypothyroidism, unspecified: Secondary | ICD-10-CM | POA: Diagnosis present

## 2011-07-07 DIAGNOSIS — E785 Hyperlipidemia, unspecified: Secondary | ICD-10-CM | POA: Diagnosis present

## 2011-07-07 DIAGNOSIS — I472 Ventricular tachycardia: Secondary | ICD-10-CM | POA: Diagnosis present

## 2011-07-07 LAB — LIPID PANEL: Cholesterol: 119 mg/dL (ref 0–200)

## 2011-07-07 LAB — RAPID URINE DRUG SCREEN, HOSP PERFORMED
Amphetamines: NOT DETECTED
Barbiturates: NOT DETECTED
Benzodiazepines: NOT DETECTED
Cocaine: NOT DETECTED
Opiates: NOT DETECTED
Tetrahydrocannabinol: NOT DETECTED

## 2011-07-07 LAB — CBC
HCT: 28.7 % — ABNORMAL LOW (ref 36.0–46.0)
MCH: 28.4 pg (ref 26.0–34.0)
MCV: 85.7 fL (ref 78.0–100.0)
Platelets: 118 10*3/uL — ABNORMAL LOW (ref 150–400)
RDW: 16.7 % — ABNORMAL HIGH (ref 11.5–15.5)

## 2011-07-07 LAB — HEMOGLOBIN A1C
Hgb A1c MFr Bld: 6.2 % — ABNORMAL HIGH (ref <5.7)
Mean Plasma Glucose: 131 mg/dL — ABNORMAL HIGH (ref <117)

## 2011-07-07 LAB — MRSA PCR SCREENING: MRSA by PCR: POSITIVE — AB

## 2011-07-07 LAB — GLUCOSE, CAPILLARY: Glucose-Capillary: 80 mg/dL (ref 70–99)

## 2011-07-07 MED ORDER — ENOXAPARIN SODIUM 40 MG/0.4ML ~~LOC~~ SOLN
40.0000 mg | SUBCUTANEOUS | Status: DC
  Administered 2011-07-07: 40 mg via SUBCUTANEOUS
  Filled 2011-07-07: qty 0.4

## 2011-07-07 MED ORDER — MUPIROCIN 2 % EX OINT
1.0000 "application " | TOPICAL_OINTMENT | Freq: Two times a day (BID) | CUTANEOUS | Status: DC
  Administered 2011-07-07: 1 via NASAL
  Filled 2011-07-07: qty 22

## 2011-07-07 MED ORDER — ATENOLOL 100 MG PO TABS
100.0000 mg | ORAL_TABLET | Freq: Two times a day (BID) | ORAL | Status: DC
  Filled 2011-07-07: qty 1

## 2011-07-07 MED ORDER — ALLOPURINOL 300 MG PO TABS
300.0000 mg | ORAL_TABLET | Freq: Every day | ORAL | Status: DC
  Administered 2011-07-07: 300 mg via ORAL
  Filled 2011-07-07: qty 1

## 2011-07-07 MED ORDER — ATENOLOL 100 MG PO TABS
100.0000 mg | ORAL_TABLET | Freq: Two times a day (BID) | ORAL | Status: DC
  Filled 2011-07-07 (×3): qty 1

## 2011-07-07 MED ORDER — GLYBURIDE-METFORMIN 5-500 MG PO TABS: 1.0000 | ORAL_TABLET | Freq: Two times a day (BID) | ORAL | Status: DC

## 2011-07-07 MED ORDER — OMEGA-3-ACID ETHYL ESTERS 1 G PO CAPS
2.0000 g | ORAL_CAPSULE | Freq: Two times a day (BID) | ORAL | Status: DC
  Administered 2011-07-07: 2 g via ORAL
  Filled 2011-07-07 (×3): qty 2

## 2011-07-07 MED ORDER — PIOGLITAZONE HCL 30 MG PO TABS
30.0000 mg | ORAL_TABLET | Freq: Every day | ORAL | Status: DC
  Administered 2011-07-07: 30 mg via ORAL
  Filled 2011-07-07: qty 1

## 2011-07-07 MED ORDER — AMLODIPINE BESYLATE 10 MG PO TABS
10.0000 mg | ORAL_TABLET | Freq: Every day | ORAL | Status: DC
  Filled 2011-07-07: qty 1

## 2011-07-07 MED ORDER — LEVOTHYROXINE SODIUM 25 MCG PO TABS
25.0000 ug | ORAL_TABLET | ORAL | Status: DC
  Administered 2011-07-07: 25 ug via ORAL
  Filled 2011-07-07: qty 1

## 2011-07-07 MED ORDER — AMIODARONE HCL 200 MG PO TABS
200.0000 mg | ORAL_TABLET | Freq: Every day | ORAL | Status: DC
  Administered 2011-07-07: 200 mg via ORAL
  Filled 2011-07-07: qty 1

## 2011-07-07 MED ORDER — PERINDOPRIL ERBUMINE 4 MG PO TABS
8.0000 mg | ORAL_TABLET | Freq: Two times a day (BID) | ORAL | Status: DC
  Filled 2011-07-07 (×2): qty 2

## 2011-07-07 MED ORDER — HYDROCHLOROTHIAZIDE 12.5 MG PO CAPS
12.5000 mg | ORAL_CAPSULE | Freq: Every day | ORAL | Status: DC
  Administered 2011-07-07: 12.5 mg via ORAL
  Filled 2011-07-07: qty 1

## 2011-07-07 MED ORDER — HYDRALAZINE HCL 25 MG PO TABS
25.0000 mg | ORAL_TABLET | Freq: Two times a day (BID) | ORAL | Status: DC
  Filled 2011-07-07 (×3): qty 1

## 2011-07-07 MED ORDER — LEVOTHYROXINE SODIUM 25 MCG PO TABS: 25.0000 ug | ORAL_TABLET | ORAL | Status: DC

## 2011-07-07 MED ORDER — GLYBURIDE 5 MG PO TABS
5.0000 mg | ORAL_TABLET | Freq: Two times a day (BID) | ORAL | Status: DC
  Administered 2011-07-07: 5 mg via ORAL
  Filled 2011-07-07 (×3): qty 1

## 2011-07-07 MED ORDER — SIMVASTATIN 10 MG PO TABS
10.0000 mg | ORAL_TABLET | Freq: Every day | ORAL | Status: DC
  Filled 2011-07-07: qty 1

## 2011-07-07 MED ORDER — OLMESARTAN MEDOXOMIL 20 MG PO TABS
20.0000 mg | ORAL_TABLET | Freq: Every day | ORAL | Status: DC
  Administered 2011-07-07: 20 mg via ORAL
  Filled 2011-07-07: qty 1

## 2011-07-07 MED ORDER — LEVOTHYROXINE SODIUM 50 MCG PO TABS
50.0000 ug | ORAL_TABLET | ORAL | Status: DC
  Filled 2011-07-07: qty 1

## 2011-07-07 MED ORDER — ASPIRIN 325 MG PO TABS
325.0000 mg | ORAL_TABLET | Freq: Every day | ORAL | Status: DC
  Administered 2011-07-07: 325 mg via ORAL
  Filled 2011-07-07: qty 1

## 2011-07-07 MED ORDER — SODIUM CHLORIDE 0.9 % IV BOLUS (SEPSIS)
250.0000 mL | Freq: Once | INTRAVENOUS | Status: AC
  Administered 2011-07-07: 250 mL via INTRAVENOUS

## 2011-07-07 MED ORDER — TELMISARTAN-HCTZ 80-12.5 MG PO TABS: 1.0000 | ORAL_TABLET | Freq: Every day | ORAL | Status: DC

## 2011-07-07 MED ORDER — METFORMIN HCL 500 MG PO TABS
500.0000 mg | ORAL_TABLET | Freq: Two times a day (BID) | ORAL | Status: DC
  Administered 2011-07-07: 500 mg via ORAL
  Filled 2011-07-07 (×3): qty 1

## 2011-07-07 MED ORDER — CHLORHEXIDINE GLUCONATE CLOTH 2 % EX PADS
6.0000 | MEDICATED_PAD | Freq: Every day | CUTANEOUS | Status: DC
  Administered 2011-07-07: 6 via TOPICAL

## 2011-07-07 MED ORDER — SODIUM CHLORIDE 0.9 % IV SOLN
INTRAVENOUS | Status: DC
  Administered 2011-07-07: 05:00:00 via INTRAVENOUS

## 2011-07-07 NOTE — Discharge Instructions (Signed)
STROKE/TIA DISCHARGE INSTRUCTIONS SMOKING Cigarette smoking nearly doubles your risk of having a stroke & is the single most alterable risk factor  If you smoke or have smoked in the last 12 months, you are advised to quit smoking for your health.  Most of the excess cardiovascular risk related to smoking disappears within a year of stopping.  Ask you doctor about anti-smoking medications  [place] Quit Line: 1-800-QUIT NOW  Free Smoking Cessation Classes [phone]  CHOLESTEROL Know your levels; limit fat & cholesterol in your diet  Lipid Panel     Component Value Date/Time   CHOL 119 07/07/2011 0500   TRIG 156* 07/07/2011 0500   HDL 24* 07/07/2011 0500   CHOLHDL 5.0 07/07/2011 0500   VLDL 31 07/07/2011 0500   LDLCALC 64 07/07/2011 0500      Many patients benefit from treatment even if their cholesterol is at goal.  Goal: Total Cholesterol (CHOL) less than 160  Goal:  Triglycerides (TRIG) less than 150  Goal:  HDL greater than 40  Goal:  LDL (LDLCALC) less than 100   BLOOD PRESSURE American Stroke Association blood pressure target is less that 120/80 mm/Hg  Your discharge blood pressure is:  BP: 150/60 mmHg  Monitor your blood pressure  Limit your salt and alcohol intake  Many individuals will require more than one medication for high blood pressure  DIABETES (A1c is a blood sugar average for last 3 months) Goal HGBA1c is under 7% (HBGA1c is blood sugar average for last 3 months)  Diabetes: {STROKE DC DIABETES:22357}    No results found for this basename: HGBA1C     Your HGBA1c can be lowered with medications, healthy diet, and exercise.  Check your blood sugar as directed by your physician  Call your physician if you experience unexplained or low blood sugars.  PHYSICAL ACTIVITY/REHABILITATION Goal is 30 minutes at least 4 days per week    {STROKE DC ACTIVITY/REHAB:22359}  Activity decreases your risk of heart attack and stroke and makes your heart stronger.  It helps  control your weight and blood pressure; helps you relax and can improve your mood.  Participate in a regular exercise program.  Talk with your doctor about the best form of exercise for you (dancing, walking, swimming, cycling).  DIET/WEIGHT Goal is to maintain a healthy weight  Your discharge diet is: NPO *** liquids Your height is:  Height: 5\' 2"  (157.5 cm) Your current weight is: Weight: 84 kg (185 lb 3 oz) Your Body Mass Index (BMI) is:  BMI (Calculated): 33.9   Following the type of diet specifically designed for you will help prevent another stroke.  Your goal weight range is:  ***  Your goal Body Mass Index (BMI) is 19-24.  Healthy food habits can help reduce 3 risk factors for stroke:  High cholesterol, hypertension, and excess weight.  RESOURCES Stroke/Support Group:  Call [phone]  they meet the 3rd Sunday of the month on the Rehab Unit at [facility], [place] ( no meetings June, July & Aug).  STROKE EDUCATION PROVIDED/REVIEWED AND GIVEN TO PATIENT Stroke warning signs and symptoms How to activate emergency medical system (call 911). Medications prescribed at discharge. Need for follow-up after discharge. Personal risk factors for stroke. Pneumonia vaccine given:   {STROKE DC YES/NO/DATE:22363} Flu vaccine given:   {STROKE DC YES/NO/DATE:22363} My questions have been answered, the writing is legible, and I understand these instructions.  I will adhere to these goals & educational materials that have been provided to me  after my discharge from the hospital.

## 2011-07-07 NOTE — Discharge Summary (Signed)
DISCHARGE SUMMARY  Leslie Coleman  MR#: [id number]  DOB:07-Apr-1929  Date of Admission: 07/06/2011 Date of Discharge: 07/07/2011  Attending Physician:Ezmeralda Stefanick  Patient's PCP:No primary provider on file.  Consults: none  Presenting Complaint: dizziness  Discharge Diagnoses: Principal Problem:  *Dizzy Active Problems:  Dehydration  HTN (hypertension)  Abnormal PFTs (pulmonary function tests)  V-tach  Hyperlipidemia  Gout  Diabetes mellitus    Discharge Medications: Medication List  As of 07/07/2011  4:47 PM   STOP taking these medications         perindopril 8 MG tablet         TAKE these medications         acetaminophen 500 MG tablet   Commonly known as: TYLENOL   Take 500 mg by mouth as needed.      ACTOS PO   Take 1 tablet by mouth daily.      allopurinol 300 MG tablet   Commonly known as: ZYLOPRIM   Take 1 tablet (300 mg total) by mouth daily.      amiodarone 200 MG tablet   Commonly known as: PACERONE   Take 200 mg by mouth daily.      amLODipine 10 MG tablet   Commonly known as: NORVASC   Take 10 mg by mouth daily.      atenolol 100 MG tablet   Commonly known as: TENORMIN   Take 100 mg by mouth 2 (two) times daily.      GERITOL PO   Take by mouth every morning.      glyBURIDE-metformin 5-500 MG per tablet   Commonly known as: GLUCOVANCE   Take 1 tablet by mouth 2 (two) times daily with a meal.      hydrALAZINE 25 MG tablet   Commonly known as: APRESOLINE   Take 1 tablet (25 mg total) by mouth 2 (two) times daily.      levothyroxine 25 MCG tablet   Commonly known as: SYNTHROID, LEVOTHROID   Take 25 mcg by mouth as directed. Taking one tablet daily Monday-Friday  and two tablets daily Saturday and sunday      multivitamins with iron Tabs   Take 1 tablet by mouth daily.      omega-3 acid ethyl esters 1 G capsule   Commonly known as: LOVAZA   Take 2 capsules (2 g total) by mouth 2 (two) times daily.      potassium chloride 10 MEQ  CR tablet   Commonly known as: KLOR-CON   Take 1 tablet (10 mEq total) by mouth daily.      pravastatin 20 MG tablet   Commonly known as: PRAVACHOL   Take 1 tablet (20 mg total) by mouth daily.      PROCRIT IJ   Inject 30,000 Units as directed every 6 (six) weeks. As directed every 6 weeks.      telmisartan-hydrochlorothiazide 80-12.5 MG per tablet   Commonly known as: MICARDIS HCT   Take 1 tablet by mouth daily.             Procedures: Dg Chest 2 View  07/06/2011  *RADIOLOGY REPORT*  Clinical Data: Dizziness, weakness, diabetes  CHEST - 2 VIEW  Comparison: 10/08/2006  Findings: Unchanged borderline enlarged cardiac silhouette and mediastinal contours.  Decreased lung volumes with basilar opacities favored to represent atelectasis.  No definite pleural effusion or pneumothorax.  No acute osseous abnormalities.  IMPRESSION: Decreased lung volumes with basilar opacities favored to represent atelectasis.  Original Report Authenticated By: Linus Mako.  Ivy Lynn, M.D.   Ct Head Wo Contrast  07/06/2011  *RADIOLOGY REPORT*  Clinical Data: Dizziness.  CT HEAD WITHOUT CONTRAST  Technique:  Contiguous axial images were obtained from the base of the skull through the vertex without contrast.  Comparison: None  Findings: There is age related cerebral atrophy, ventriculomegaly and periventricular white matter disease.  The area of vague low attenuation in the left occipital lobe suspicious for infarction. No hemorrhage.  No mass.  No extra-axial fluid collections.  The brainstem and cerebellum grossly normal. Possible small calcified meningioma in the left high posterior parietal area.  The bony structures are intact.  Hyperostosis frontalis interna is noted.  The paranasal sinuses are clear.  Chronic-appearing mastoid hypoaeration is noted.  IMPRESSION:  1.  Findings suspicious for an acute left occipital infarction.  No hemorrhage. 2.  Age related cerebral atrophy, ventriculomegaly and periventricular white  matter disease.  Original Report Authenticated By: P. Kalman Jewels, M.D.   Mri Brain Without Contrast  07/07/2011  *RADIOLOGY REPORT*  Clinical Data:  CVA.  Dizziness  MRI HEAD WITHOUT CONTRAST MRA HEAD WITHOUT CONTRAST  Technique:  Multiplanar, multiecho pulse sequences of the brain and surrounding structures were obtained without intravenous contrast. Angiographic images of the head were obtained using MRA technique without contrast.  Comparison:  CT head 07/06/2011  MRI HEAD  Findings:  Negative for acute infarct.  Hypodensity left occipital lobe on the CT apparently was artifactual.  Generalized atrophy.  Minimal chronic microvascular ischemia in the white matter.  Brainstem and cerebellum are normal.  Small chronic lacuna posterior putamen on the right.  Negative for hemorrhage or mass lesion.  Negative for edema.  No midline shift.  Benign appearing calcifications over the convexity on the left posteriorly appears benign.  IMPRESSION: Negative for acute infarct.  Generalized atrophy with mild chronic ischemia.  MRA HEAD  Findings: Both vertebral arteries are patent to the basilar. Vertebral basilar are tortuous but patent.  PICA, superior cerebellar, and posterior cerebral arteries are patent bilaterally. Fetal origin of the posterior cerebral arteries bilaterally.  Anterior and middle cerebral arteries are patent bilaterally without significant stenosis.  Negative for cerebral aneurysm.  IMPRESSION: Negative  Original Report Authenticated By: Truett Perna, M.D.   Mr Mra Head/brain Wo Cm  07/07/2011  *RADIOLOGY REPORT*  Clinical Data:  CVA.  Dizziness  MRI HEAD WITHOUT CONTRAST MRA HEAD WITHOUT CONTRAST  Technique:  Multiplanar, multiecho pulse sequences of the brain and surrounding structures were obtained without intravenous contrast. Angiographic images of the head were obtained using MRA technique without contrast.  Comparison:  CT head 07/06/2011  MRI HEAD  Findings:  Negative for acute infarct.   Hypodensity left occipital lobe on the CT apparently was artifactual.  Generalized atrophy.  Minimal chronic microvascular ischemia in the white matter.  Brainstem and cerebellum are normal.  Small chronic lacuna posterior putamen on the right.  Negative for hemorrhage or mass lesion.  Negative for edema.  No midline shift.  Benign appearing calcifications over the convexity on the left posteriorly appears benign.  IMPRESSION: Negative for acute infarct.  Generalized atrophy with mild chronic ischemia.  MRA HEAD  Findings: Both vertebral arteries are patent to the basilar. Vertebral basilar are tortuous but patent.  PICA, superior cerebellar, and posterior cerebral arteries are patent bilaterally. Fetal origin of the posterior cerebral arteries bilaterally.  Anterior and middle cerebral arteries are patent bilaterally without significant stenosis.  Negative for cerebral aneurysm.  IMPRESSION: Negative  Original Report Authenticated By:  Truett Perna, M.D.    Echocardiogram- 2/15 Left ventricle: Systolic function was mildly reduced. The estimated ejection fraction was in the range of 45% to 50%. - Pulmonary arteries: PA peak pressure: 27mm Hg (S).    Hospital Course: Principal Problem:  *Dizzy This is an 78 y/o female who presented to the ER with a complaint of dizziness. Initial CT of the head showed a left cerebellar infarct and therefore she was admitted and a CVA work up was initiated. Upon my evaluation today, she denied further dizziness and was noted to ambulate around the nursing station without and complaints of dizziness.  An MRI that was performed today is negative for a CVA. I have asked the neurologist on call to confirm this and he agree's that there is no CVA and the image on the CT Scan may be related to an artifact.  Anyhow, her CVA workup had been completed prior to the above MRI results and her ECHO reveals mild systolic dysfunction with an EF of 45-50%. She is already on an ARB.    Carotid dopplers are negative for any significant stenosis and a fasting lipid profile reveals mildly elevated Triglycerides (156) and low HDL (24).   Active Problems:   Dehydration- At this point, I suspect her symptoms of being dizzy may have come from dehydration. Her BUN/Cr ratio is > 20 and her initial orthostatic vital were positive. I have hydrated her and her repeat orthostatics have improved. She is noted to be on both an ACE I and an ARB. There is no clinical indication to be on both and they may be leading to her dehydration. She is also on HCTZ. At this point, I am holding her ACE I.   UTI??- UA is positive but she denies dysuria and therefore, I have not initiated treatment for this.    HTN (hypertension)- remains stable.   V-tach- past history of this- not a current issue.   Hyperlipidemia  Gout  Diabetes mellitus   Day of Discharge Physical Exam: BP 133/57  Pulse 60  Temp(Src) 98.1 F (36.7 C) (Oral)  Resp 20  Ht 5\' 2"  (1.575 m)  Wt 84 kg (185 lb 3 oz)  BMI 33.87 kg/m2  SpO2 96% General: Alert and oriented times three, well developed and nourished, no acute distress  Eyes: PERRLA, pink conjunctiva, no scleral icterus  ENT: Moist oral mucosa, neck supple, no thyromegaly  Lungs: clear to ascultation, no wheeze, no crackles, no use of accessory muscles  Cardiovascular: regular rate and rhythm, no regurgitation, no gallops, no murmurs. No carotid bruits, no JVD  Abdomen: soft, positive BS, non-tender, non-distended, no organomegaly, not an acute abdomen  GU: not examined  Neuro: CN II - XII grossly intact, sensation intact  Musculoskeletal: strength 5/5 all extremities, no clubbing, cyanosis or edema  Skin: no rash, no subcutaneous crepitation, no decubitus   Results for orders placed during the hospital encounter of 07/06/11 (from the past 24 hour(s))  CBC     Status: Abnormal   Collection Time   07/06/11  8:55 PM      Component Value Range   WBC 6.0  4.0 - 10.5  (K/uL)   RBC 3.35 (*) 3.87 - 5.11 (MIL/uL)   Hemoglobin 9.4 (*) 12.0 - 15.0 (g/dL)   HCT 29.0 (*) 36.0 - 46.0 (%)   MCV 86.6  78.0 - 100.0 (fL)   MCH 28.1  26.0 - 34.0 (pg)   MCHC 32.4  30.0 - 36.0 (g/dL)  RDW 16.7 (*) 11.5 - 15.5 (%)   Platelets 121 (*) 150 - 400 (K/uL)  DIFFERENTIAL     Status: Normal   Collection Time   07/06/11  8:55 PM      Component Value Range   Neutrophils Relative 61  43 - 77 (%)   Neutro Abs 3.7  1.7 - 7.7 (K/uL)   Lymphocytes Relative 31  12 - 46 (%)   Lymphs Abs 1.9  0.7 - 4.0 (K/uL)   Monocytes Relative 7  3 - 12 (%)   Monocytes Absolute 0.4  0.1 - 1.0 (K/uL)   Eosinophils Relative 1  0 - 5 (%)   Eosinophils Absolute 0.1  0.0 - 0.7 (K/uL)   Basophils Relative 0  0 - 1 (%)   Basophils Absolute 0.0  0.0 - 0.1 (K/uL)  COMPREHENSIVE METABOLIC PANEL     Status: Abnormal   Collection Time   07/06/11  8:55 PM      Component Value Range   Sodium 138  135 - 145 (mEq/L)   Potassium 4.1  3.5 - 5.1 (mEq/L)   Chloride 105  96 - 112 (mEq/L)   CO2 20  19 - 32 (mEq/L)   Glucose, Bld 188 (*) 70 - 99 (mg/dL)   BUN 28 (*) 6 - 23 (mg/dL)   Creatinine, Ser 1.09  0.50 - 1.10 (mg/dL)   Calcium 9.9  8.4 - 10.5 (mg/dL)   Total Protein 7.4  6.0 - 8.3 (g/dL)   Albumin 3.2 (*) 3.5 - 5.2 (g/dL)   AST 24  0 - 37 (U/L)   ALT 16  0 - 35 (U/L)   Alkaline Phosphatase 76  39 - 117 (U/L)   Total Bilirubin 0.3  0.3 - 1.2 (mg/dL)   GFR calc non Af Amer 46 (*) >90 (mL/min)   GFR calc Af Amer 54 (*) >90 (mL/min)  TROPONIN I     Status: Normal   Collection Time   07/06/11  8:55 PM      Component Value Range   Troponin I <0.30  <0.30 (ng/mL)  CK TOTAL AND CKMB     Status: Normal   Collection Time   07/06/11  8:55 PM      Component Value Range   Total CK 44  7 - 177 (U/L)   CK, MB 2.4  0.3 - 4.0 (ng/mL)   Relative Index RELATIVE INDEX IS INVALID  0.0 - 2.5   URINALYSIS, ROUTINE W REFLEX MICROSCOPIC     Status: Abnormal   Collection Time   07/06/11  9:16 PM      Component  Value Range   Color, Urine YELLOW  YELLOW    APPearance CLEAR  CLEAR    Specific Gravity, Urine 1.014  1.005 - 1.030    pH 6.0  5.0 - 8.0    Glucose, UA NEGATIVE  NEGATIVE (mg/dL)   Hgb urine dipstick MODERATE (*) NEGATIVE    Bilirubin Urine NEGATIVE  NEGATIVE    Ketones, ur NEGATIVE  NEGATIVE (mg/dL)   Protein, ur 100 (*) NEGATIVE (mg/dL)   Urobilinogen, UA 0.2  0.0 - 1.0 (mg/dL)   Nitrite NEGATIVE  NEGATIVE    Leukocytes, UA SMALL (*) NEGATIVE   URINE MICROSCOPIC-ADD ON     Status: Abnormal   Collection Time   07/06/11  9:16 PM      Component Value Range   Squamous Epithelial / LPF RARE  RARE    WBC, UA 11-20  <3 (WBC/hpf)   RBC /  HPF 7-10  <3 (RBC/hpf)   Bacteria, UA MANY (*) RARE    Urine-Other MUCOUS PRESENT    MRSA PCR SCREENING     Status: Abnormal   Collection Time   07/07/11  3:15 AM      Component Value Range   MRSA by PCR POSITIVE (*) NEGATIVE   HEMOGLOBIN A1C     Status: Abnormal   Collection Time   07/07/11  5:00 AM      Component Value Range   Hemoglobin A1C 6.2 (*) <5.7 (%)   Mean Plasma Glucose 131 (*) <117 (mg/dL)  LIPID PANEL     Status: Abnormal   Collection Time   07/07/11  5:00 AM      Component Value Range   Cholesterol 119  0 - 200 (mg/dL)   Triglycerides 156 (*) <150 (mg/dL)   HDL 24 (*) >39 (mg/dL)   Total CHOL/HDL Ratio 5.0     VLDL 31  0 - 40 (mg/dL)   LDL Cholesterol 64  0 - 99 (mg/dL)  CBC     Status: Abnormal   Collection Time   07/07/11  5:00 AM      Component Value Range   WBC 6.5  4.0 - 10.5 (K/uL)   RBC 3.35 (*) 3.87 - 5.11 (MIL/uL)   Hemoglobin 9.5 (*) 12.0 - 15.0 (g/dL)   HCT 28.7 (*) 36.0 - 46.0 (%)   MCV 85.7  78.0 - 100.0 (fL)   MCH 28.4  26.0 - 34.0 (pg)   MCHC 33.1  30.0 - 36.0 (g/dL)   RDW 16.7 (*) 11.5 - 15.5 (%)   Platelets 118 (*) 150 - 400 (K/uL)  CREATININE, SERUM     Status: Abnormal   Collection Time   07/07/11  5:00 AM      Component Value Range   Creatinine, Ser 0.89  0.50 - 1.10 (mg/dL)   GFR calc non Af Amer  59 (*) >90 (mL/min)   GFR calc Af Amer 69 (*) >90 (mL/min)  URINE RAPID DRUG SCREEN (HOSP PERFORMED)     Status: Normal   Collection Time   07/07/11  6:34 AM      Component Value Range   Opiates NONE DETECTED  NONE DETECTED    Cocaine NONE DETECTED  NONE DETECTED    Benzodiazepines NONE DETECTED  NONE DETECTED    Amphetamines NONE DETECTED  NONE DETECTED    Tetrahydrocannabinol NONE DETECTED  NONE DETECTED    Barbiturates NONE DETECTED  NONE DETECTED   GLUCOSE, CAPILLARY     Status: Abnormal   Collection Time   07/07/11  8:37 AM      Component Value Range   Glucose-Capillary 110 (*) 70 - 99 (mg/dL)  GLUCOSE, CAPILLARY     Status: Normal   Collection Time   07/07/11 12:37 PM      Component Value Range   Glucose-Capillary 80  70 - 99 (mg/dL)    Disposition: stable.    Follow-up Appts: Discharge Orders    Future Appointments: Provider: Department: Dept Phone: Center:   07/13/2011 8:30 AM [facility]  None   08/14/2011 9:00 AM Coralyn Mark, MD [place] [phone] [id number]     Future Orders Please Complete By Expires   Diet - low sodium heart healthy      Increase activity slowly      Discharge instructions      Comments:   Please follow up with your Family doctor on Monday.  Follow-up with Dr. Felicie Morn in 2-3 days   Time on Discharge: >45 min  Signed: [facility] 07/07/2011, 4:47 PM

## 2011-07-07 NOTE — Progress Notes (Signed)
Echocardiogram 2D Echocardiogram has been performed.  Leslie Coleman 07/07/2011, 10:43 AM

## 2011-07-07 NOTE — Progress Notes (Signed)
Bilateral carotid artery duplex completed.  Preliminary report is no evidence of significant ICA stenosis.

## 2011-07-07 NOTE — Progress Notes (Signed)
Inpatient Diabetes Program Recommendations  AACE/ADA: New Consensus Statement on Inpatient Glycemic Control (2009)  Target Ranges:  Prepandial:   less than 140 mg/dL      Peak postprandial:   less than 180 mg/dL (1-2 hours)      Critically ill patients:  140 - 180 mg/dL    Inpatient Diabetes Program Recommendations Correction (SSI): Start Sensitive TID if needed

## 2011-07-07 NOTE — ED Notes (Signed)
Called [facility] for patient

## 2011-07-07 NOTE — Progress Notes (Signed)
Pt d/c home daughter, paperwork and instructions given with no questions or concerns. Taken to car via wheelchair. [place], [place] M

## 2011-07-11 DIAGNOSIS — R5383 Other fatigue: Secondary | ICD-10-CM | POA: Diagnosis not present

## 2011-07-11 DIAGNOSIS — I10 Essential (primary) hypertension: Secondary | ICD-10-CM | POA: Diagnosis not present

## 2011-07-11 DIAGNOSIS — E782 Mixed hyperlipidemia: Secondary | ICD-10-CM | POA: Diagnosis not present

## 2011-07-11 DIAGNOSIS — R5381 Other malaise: Secondary | ICD-10-CM | POA: Diagnosis not present

## 2011-07-11 DIAGNOSIS — E119 Type 2 diabetes mellitus without complications: Secondary | ICD-10-CM | POA: Diagnosis not present

## 2011-07-13 ENCOUNTER — Encounter (HOSPITAL_COMMUNITY)
Admission: RE | Admit: 2011-07-13 | Discharge: 2011-07-13 | Disposition: A | Discharge status: Home / Self Care | Payer: Medicare Other | Source: Ambulatory Visit | Attending: Internal Medicine | Admitting: Internal Medicine

## 2011-07-13 DIAGNOSIS — D638 Anemia in other chronic diseases classified elsewhere: Secondary | ICD-10-CM | POA: Diagnosis not present

## 2011-07-13 DIAGNOSIS — N289 Disorder of kidney and ureter, unspecified: Secondary | ICD-10-CM | POA: Insufficient documentation

## 2011-07-13 DIAGNOSIS — N2581 Secondary hyperparathyroidism of renal origin: Secondary | ICD-10-CM | POA: Diagnosis not present

## 2011-07-13 LAB — POCT HEMOGLOBIN-HEMACUE: Hemoglobin: 10.2 g/dL — ABNORMAL LOW (ref 12.0–15.0)

## 2011-07-13 MED ORDER — EPOETIN ALFA 20000 UNIT/ML IJ SOLN
INTRAMUSCULAR | Status: AC
  Filled 2011-07-13: qty 1

## 2011-07-13 MED ORDER — EPOETIN ALFA 10000 UNIT/ML IJ SOLN
30000.0000 [IU] | INTRAMUSCULAR | Status: DC
  Administered 2011-07-13: 30000 [IU] via SUBCUTANEOUS

## 2011-07-13 MED ORDER — EPOETIN ALFA 10000 UNIT/ML IJ SOLN
INTRAMUSCULAR | Status: AC
  Administered 2011-07-13: 30000 [IU] via SUBCUTANEOUS
  Filled 2011-07-13: qty 1

## 2011-08-08 DIAGNOSIS — D649 Anemia, unspecified: Secondary | ICD-10-CM | POA: Diagnosis not present

## 2011-08-14 ENCOUNTER — Ambulatory Visit (INDEPENDENT_AMBULATORY_CARE_PROVIDER_SITE_OTHER): Payer: Medicare Other | Admitting: Internal Medicine

## 2011-08-14 ENCOUNTER — Encounter: Payer: Self-pay | Admitting: Internal Medicine

## 2011-08-14 VITALS — BP 138/61 | HR 61 | Resp 18 | Ht 62.0 in | Wt 188.0 lb

## 2011-08-14 DIAGNOSIS — I472 Ventricular tachycardia: Secondary | ICD-10-CM | POA: Diagnosis not present

## 2011-08-14 DIAGNOSIS — M109 Gout, unspecified: Secondary | ICD-10-CM

## 2011-08-14 DIAGNOSIS — I10 Essential (primary) hypertension: Secondary | ICD-10-CM

## 2011-08-14 DIAGNOSIS — E785 Hyperlipidemia, unspecified: Secondary | ICD-10-CM

## 2011-08-14 DIAGNOSIS — R42 Dizziness and giddiness: Secondary | ICD-10-CM

## 2011-08-14 MED ORDER — ALLOPURINOL 300 MG PO TABS: 300.0000 mg | ORAL_TABLET | Freq: Every day | ORAL | Status: DC

## 2011-08-14 MED ORDER — PRAVASTATIN SODIUM 20 MG PO TABS: 20.0000 mg | ORAL_TABLET | Freq: Every day | ORAL | Status: DC

## 2011-08-14 NOTE — Patient Instructions (Signed)
Your physician wants you to follow-up in: 6 months with Kathrene Alu and 12 months with Dr Vallery Ridge will receive a reminder letter in the mail two months in advance. If you don't receive a letter, please call our office to schedule the follow-up appointment.

## 2011-08-14 NOTE — Progress Notes (Signed)
Leslie Coleman is a pleasant 78 y.o. WF patient with a h/o remote ventricular tachycardia followed previously by Dr Doreatha Lew.  She had EP study (per his report) in the 1980s with no inducible VT.  She was treated initially with procainamide but has been on amiodarone since 2008.  She appears to be doing very well and remains active despite her age.  She denies any recent episodes of arrhythmia.  She was admitted 2/13 due to dizziness.  This was felt to be due to orthostatic hypotension and her ace inhibitor was stopped.  She has done well without further episodes since that time.  Today, she denies symptoms of palpitations, chest pain, shortness of breath, orthopnea, PND, lower extremity edema, presyncope, syncope, or neurologic sequela. The patient is tolerating medications without difficulties and is otherwise without complaint today.   Past Medical History  Diagnosis Date  . V-tach     remote VT, treated initially with procainamide, on amiodarone since 2008  . HTN (hypertension)     Last echo in 2004; EF normal  . Hyperlipidemia   . Diabetes mellitus   . Obesity   . Hypothyroidism     on replacement  . Anemia     treated with Procrit  . Gout   . Abnormal PFTs (pulmonary function tests) 2008    No obstruction noted. Has been followed clinically.  . High risk medication use     on amiodarone.   Past Surgical History  Procedure Date  . Cardioversion 1978  . Cesarean section     x 3  . Abdominal hysterectomy   . US echocardiography 12/29/2002    EF 60-65%  . Cardiovascular stress test 07/07/2003    EF 70%. NO EVIDENCE OF ISCHEMIA    Current Outpatient Prescriptions  Medication Sig Dispense Refill  . acetaminophen (TYLENOL) 500 MG tablet Take 500 mg by mouth as needed.        Marland Kitchen allopurinol (ZYLOPRIM) 300 MG tablet Take 1 tablet (300 mg total) by mouth daily.  90 tablet  3  . amiodarone (PACERONE) 200 MG tablet Take 200 mg by mouth daily.        Marland Kitchen amLODipine (NORVASC) 10 MG tablet  Take 10 mg by mouth daily.        Marland Kitchen atenolol (TENORMIN) 100 MG tablet Take 100 mg by mouth 2 (two) times daily.        Marland Kitchen Epoetin Alfa (PROCRIT IJ) Inject 30,000 Units as directed every 6 (six) weeks. As directed every 6 weeks.      Marland Kitchen glyBURIDE-metformin (GLUCOVANCE) 5-500 MG per tablet Take 1 tablet by mouth 2 (two) times daily with a meal.        . hydrALAZINE (APRESOLINE) 25 MG tablet Take 1 tablet (25 mg total) by mouth 2 (two) times daily.  180 tablet  3  . Iron-Vitamins (GERITOL PO) Take by mouth every morning.       Marland Kitchen levothyroxine (SYNTHROID, LEVOTHROID) 25 MCG tablet Take 25 mcg by mouth as directed. Taking one tablet daily Monday-Friday  and two tablets daily Saturday and sunday      . Multiple Vitamins-Iron (MULTIVITAMINS WITH IRON) TABS Take 1 tablet by mouth daily.        Marland Kitchen omega-3 acid ethyl esters (LOVAZA) 1 G capsule Take 2 capsules (2 g total) by mouth 2 (two) times daily.  360 capsule  3  . Pioglitazone HCl (ACTOS PO) Take 1 tablet by mouth daily.      . potassium chloride (  KLOR-CON) 10 MEQ CR tablet Take 1 tablet (10 mEq total) by mouth daily.  90 tablet  3  . pravastatin (PRAVACHOL) 20 MG tablet Take 1 tablet (20 mg total) by mouth daily.  90 tablet  3  . telmisartan-hydrochlorothiazide (MICARDIS HCT) 80-12.5 MG per tablet Take 1 tablet by mouth daily.          No Known Allergies  History   Social History  . Marital Status: Married    Spouse Name: N/A    Number of Children: N/A  . Years of Education: N/A   Occupational History  . Not on file.   Social History Main Topics  . Smoking status: Never Smoker   . Smokeless tobacco: Never Used  . Alcohol Use: No  . Drug Use: No  . Sexually Active:    Other Topics Concern  . Not on file   Social History Narrative   Lives with spouse in [place]    Family History  Problem Relation Age of Onset  . Heart disease Mother   . Heart disease Father     Physical Exam: Filed Vitals:   08/14/11 0912  BP: 138/61    Pulse: 61  Resp: 18  Height: 5\' 2"  (1.575 m)  Weight: 188 lb (85.276 kg)    GEN- The patient is well appearing, alert and oriented x 3 today.   Head- normocephalic, atraumatic Eyes-  Sclera clear, conjunctiva pink Ears- hearing intact Oropharynx- clear Neck- supple, no JVP Lymph- no cervical lymphadenopathy Lungs- Clear to ausculation bilaterally, normal work of breathing Heart- Regular rate and rhythm, no murmurs, rubs or gallops, PMI not laterally displaced GI- soft, NT, ND, + BS Extremities- no clubbing, cyanosis, or edema MS- no significant deformity or atrophy Skin- no rash or lesion Psych- euthymic mood, full affect Neuro- strength and sensation are intact  EKG today reveals sinus rhythm 58 bpm, otherwise normal ekg  Assessment and Plan:

## 2011-08-14 NOTE — Assessment & Plan Note (Signed)
Resolved If recurs, stop hctz

## 2011-08-14 NOTE — Assessment & Plan Note (Signed)
Stable No changes today  If further postural dizziness, I would stop hctz at that point

## 2011-08-14 NOTE — Assessment & Plan Note (Signed)
Well controlled with amoidarone Consider decreasing amiodarone to 100mg  daily if stable upon return  Follow-up with Truitt Merle in 6 months,  Repeat LFTs, TFTs, and PFTs at that time.

## 2011-08-24 ENCOUNTER — Encounter (HOSPITAL_COMMUNITY)
Admission: RE | Admit: 2011-08-24 | Discharge: 2011-08-24 | Disposition: A | Discharge status: Home / Self Care | Payer: Medicare Other | Source: Ambulatory Visit | Attending: Internal Medicine | Admitting: Internal Medicine

## 2011-08-24 DIAGNOSIS — N289 Disorder of kidney and ureter, unspecified: Secondary | ICD-10-CM | POA: Diagnosis not present

## 2011-08-24 DIAGNOSIS — N2581 Secondary hyperparathyroidism of renal origin: Secondary | ICD-10-CM | POA: Insufficient documentation

## 2011-08-24 DIAGNOSIS — D638 Anemia in other chronic diseases classified elsewhere: Secondary | ICD-10-CM | POA: Diagnosis not present

## 2011-08-24 LAB — POCT HEMOGLOBIN-HEMACUE: Hemoglobin: 10.8 g/dL — ABNORMAL LOW (ref 12.0–15.0)

## 2011-08-24 MED ORDER — EPOETIN ALFA 10000 UNIT/ML IJ SOLN
INTRAMUSCULAR | Status: AC
  Administered 2011-08-24: 30000 [IU] via SUBCUTANEOUS
  Filled 2011-08-24: qty 1

## 2011-08-24 MED ORDER — EPOETIN ALFA 10000 UNIT/ML IJ SOLN
30000.0000 [IU] | INTRAMUSCULAR | Status: DC
  Administered 2011-08-24: 30000 [IU] via SUBCUTANEOUS

## 2011-08-24 MED ORDER — EPOETIN ALFA 20000 UNIT/ML IJ SOLN
INTRAMUSCULAR | Status: AC
  Filled 2011-08-24: qty 1

## 2011-09-14 DIAGNOSIS — H353290 Exudative age-related macular degeneration, unspecified eye, stage unspecified: Secondary | ICD-10-CM | POA: Diagnosis not present

## 2011-09-22 DIAGNOSIS — H353190 Nonexudative age-related macular degeneration, unspecified eye, stage unspecified: Secondary | ICD-10-CM | POA: Diagnosis not present

## 2011-09-22 DIAGNOSIS — H35059 Retinal neovascularization, unspecified, unspecified eye: Secondary | ICD-10-CM | POA: Diagnosis not present

## 2011-09-22 DIAGNOSIS — H43819 Vitreous degeneration, unspecified eye: Secondary | ICD-10-CM | POA: Diagnosis not present

## 2011-09-22 DIAGNOSIS — H353290 Exudative age-related macular degeneration, unspecified eye, stage unspecified: Secondary | ICD-10-CM | POA: Diagnosis not present

## 2011-09-25 DIAGNOSIS — I10 Essential (primary) hypertension: Secondary | ICD-10-CM | POA: Diagnosis not present

## 2011-09-25 DIAGNOSIS — E119 Type 2 diabetes mellitus without complications: Secondary | ICD-10-CM | POA: Diagnosis not present

## 2011-09-25 DIAGNOSIS — E782 Mixed hyperlipidemia: Secondary | ICD-10-CM | POA: Diagnosis not present

## 2011-10-02 ENCOUNTER — Other Ambulatory Visit (HOSPITAL_COMMUNITY): Payer: Self-pay | Admitting: *Deleted

## 2011-10-02 DIAGNOSIS — D649 Anemia, unspecified: Secondary | ICD-10-CM | POA: Diagnosis not present

## 2011-10-02 DIAGNOSIS — M109 Gout, unspecified: Secondary | ICD-10-CM | POA: Diagnosis not present

## 2011-10-02 DIAGNOSIS — E119 Type 2 diabetes mellitus without complications: Secondary | ICD-10-CM | POA: Diagnosis not present

## 2011-10-02 DIAGNOSIS — I10 Essential (primary) hypertension: Secondary | ICD-10-CM | POA: Diagnosis not present

## 2011-10-05 ENCOUNTER — Encounter (HOSPITAL_COMMUNITY)
Admission: RE | Admit: 2011-10-05 | Discharge: 2011-10-05 | Disposition: A | Discharge status: Home / Self Care | Payer: Medicare Other | Source: Ambulatory Visit | Attending: Internal Medicine | Admitting: Internal Medicine

## 2011-10-05 DIAGNOSIS — D638 Anemia in other chronic diseases classified elsewhere: Secondary | ICD-10-CM | POA: Diagnosis not present

## 2011-10-05 DIAGNOSIS — N289 Disorder of kidney and ureter, unspecified: Secondary | ICD-10-CM | POA: Diagnosis not present

## 2011-10-05 DIAGNOSIS — N2581 Secondary hyperparathyroidism of renal origin: Secondary | ICD-10-CM | POA: Diagnosis not present

## 2011-10-05 MED ORDER — EPOETIN ALFA 10000 UNIT/ML IJ SOLN
30000.0000 [IU] | INTRAMUSCULAR | Status: DC
  Administered 2011-10-05: 30000 [IU] via SUBCUTANEOUS

## 2011-10-05 MED ORDER — EPOETIN ALFA 10000 UNIT/ML IJ SOLN
INTRAMUSCULAR | Status: AC
  Administered 2011-10-05: 30000 [IU] via SUBCUTANEOUS
  Filled 2011-10-05: qty 1

## 2011-10-05 MED ORDER — EPOETIN ALFA 20000 UNIT/ML IJ SOLN
INTRAMUSCULAR | Status: AC
  Filled 2011-10-05: qty 1

## 2011-10-06 LAB — POCT HEMOGLOBIN-HEMACUE: Hemoglobin: 10 g/dL — ABNORMAL LOW (ref 12.0–15.0)

## 2011-10-13 DIAGNOSIS — H353290 Exudative age-related macular degeneration, unspecified eye, stage unspecified: Secondary | ICD-10-CM | POA: Diagnosis not present

## 2011-11-02 ENCOUNTER — Encounter (HOSPITAL_COMMUNITY)
Admission: RE | Admit: 2011-11-02 | Discharge: 2011-11-02 | Disposition: A | Discharge status: Home / Self Care | Payer: Medicare Other | Source: Ambulatory Visit | Attending: Internal Medicine | Admitting: Internal Medicine

## 2011-11-02 DIAGNOSIS — N2581 Secondary hyperparathyroidism of renal origin: Secondary | ICD-10-CM | POA: Diagnosis not present

## 2011-11-02 DIAGNOSIS — D638 Anemia in other chronic diseases classified elsewhere: Secondary | ICD-10-CM | POA: Insufficient documentation

## 2011-11-02 DIAGNOSIS — N289 Disorder of kidney and ureter, unspecified: Secondary | ICD-10-CM | POA: Insufficient documentation

## 2011-11-02 LAB — POCT HEMOGLOBIN-HEMACUE: Hemoglobin: 9.7 g/dL — ABNORMAL LOW (ref 12.0–15.0)

## 2011-11-02 MED ORDER — EPOETIN ALFA 20000 UNIT/ML IJ SOLN
INTRAMUSCULAR | Status: AC
  Administered 2011-11-02: 20000 [IU] via SUBCUTANEOUS
  Filled 2011-11-02: qty 1

## 2011-11-02 MED ORDER — EPOETIN ALFA 10000 UNIT/ML IJ SOLN
30000.0000 [IU] | INTRAMUSCULAR | Status: DC
  Administered 2011-11-02: 10000 [IU] via SUBCUTANEOUS

## 2011-11-02 MED ORDER — EPOETIN ALFA 10000 UNIT/ML IJ SOLN
INTRAMUSCULAR | Status: AC
  Filled 2011-11-02: qty 1

## 2011-11-15 DIAGNOSIS — H353190 Nonexudative age-related macular degeneration, unspecified eye, stage unspecified: Secondary | ICD-10-CM | POA: Diagnosis not present

## 2011-11-15 DIAGNOSIS — H353290 Exudative age-related macular degeneration, unspecified eye, stage unspecified: Secondary | ICD-10-CM | POA: Diagnosis not present

## 2011-11-15 DIAGNOSIS — H35059 Retinal neovascularization, unspecified, unspecified eye: Secondary | ICD-10-CM | POA: Diagnosis not present

## 2011-11-15 DIAGNOSIS — H43819 Vitreous degeneration, unspecified eye: Secondary | ICD-10-CM | POA: Diagnosis not present

## 2011-11-20 ENCOUNTER — Telehealth: Payer: Self-pay | Admitting: Internal Medicine

## 2011-11-20 DIAGNOSIS — E785 Hyperlipidemia, unspecified: Secondary | ICD-10-CM

## 2011-11-20 MED ORDER — PRAVASTATIN SODIUM 20 MG PO TABS: 20.0000 mg | ORAL_TABLET | Freq: Every day | ORAL | Status: DC

## 2011-11-20 NOTE — Telephone Encounter (Signed)
Fax Received. Refill Completed. Leslie Coleman (R.M.A)   

## 2011-11-28 DIAGNOSIS — M25469 Effusion, unspecified knee: Secondary | ICD-10-CM | POA: Diagnosis not present

## 2011-11-30 ENCOUNTER — Encounter (HOSPITAL_COMMUNITY): Payer: Medicare Other

## 2011-12-07 ENCOUNTER — Encounter (HOSPITAL_COMMUNITY): Payer: No Typology Code available for payment source

## 2011-12-12 DIAGNOSIS — M25469 Effusion, unspecified knee: Secondary | ICD-10-CM | POA: Diagnosis not present

## 2011-12-13 ENCOUNTER — Other Ambulatory Visit: Payer: Self-pay | Admitting: Nurse Practitioner

## 2011-12-13 MED ORDER — ALLOPURINOL 300 MG PO TABS: 300.0000 mg | ORAL_TABLET | Freq: Every day | ORAL | Status: DC

## 2011-12-21 ENCOUNTER — Encounter (HOSPITAL_COMMUNITY)
Admission: RE | Admit: 2011-12-21 | Discharge: 2011-12-21 | Disposition: A | Discharge status: Home / Self Care | Payer: Medicare Other | Source: Ambulatory Visit | Attending: Internal Medicine | Admitting: Internal Medicine

## 2011-12-21 DIAGNOSIS — D638 Anemia in other chronic diseases classified elsewhere: Secondary | ICD-10-CM | POA: Insufficient documentation

## 2011-12-21 DIAGNOSIS — N289 Disorder of kidney and ureter, unspecified: Secondary | ICD-10-CM | POA: Diagnosis not present

## 2011-12-21 DIAGNOSIS — N2581 Secondary hyperparathyroidism of renal origin: Secondary | ICD-10-CM | POA: Diagnosis not present

## 2011-12-21 MED ORDER — EPOETIN ALFA 20000 UNIT/ML IJ SOLN
INTRAMUSCULAR | Status: AC
  Filled 2011-12-21: qty 1

## 2011-12-21 MED ORDER — EPOETIN ALFA 10000 UNIT/ML IJ SOLN
INTRAMUSCULAR | Status: AC
  Filled 2011-12-21: qty 1

## 2011-12-21 MED ORDER — EPOETIN ALFA 10000 UNIT/ML IJ SOLN
30000.0000 [IU] | INTRAMUSCULAR | Status: DC
  Administered 2011-12-21: 30000 [IU] via SUBCUTANEOUS

## 2011-12-25 DIAGNOSIS — E119 Type 2 diabetes mellitus without complications: Secondary | ICD-10-CM | POA: Diagnosis not present

## 2011-12-25 DIAGNOSIS — I10 Essential (primary) hypertension: Secondary | ICD-10-CM | POA: Diagnosis not present

## 2011-12-25 DIAGNOSIS — D649 Anemia, unspecified: Secondary | ICD-10-CM | POA: Diagnosis not present

## 2012-01-01 DIAGNOSIS — I10 Essential (primary) hypertension: Secondary | ICD-10-CM | POA: Diagnosis not present

## 2012-01-01 DIAGNOSIS — E119 Type 2 diabetes mellitus without complications: Secondary | ICD-10-CM | POA: Diagnosis not present

## 2012-01-01 DIAGNOSIS — M109 Gout, unspecified: Secondary | ICD-10-CM | POA: Diagnosis not present

## 2012-01-01 DIAGNOSIS — E782 Mixed hyperlipidemia: Secondary | ICD-10-CM | POA: Diagnosis not present

## 2012-01-17 ENCOUNTER — Other Ambulatory Visit (HOSPITAL_COMMUNITY): Payer: Self-pay | Admitting: *Deleted

## 2012-01-18 ENCOUNTER — Encounter (HOSPITAL_COMMUNITY)
Admission: RE | Admit: 2012-01-18 | Discharge: 2012-01-18 | Disposition: A | Discharge status: Home / Self Care | Payer: Medicare Other | Source: Ambulatory Visit | Attending: Internal Medicine | Admitting: Internal Medicine

## 2012-01-18 MED ORDER — EPOETIN ALFA 10000 UNIT/ML IJ SOLN
30000.0000 [IU] | INTRAMUSCULAR | Status: DC
  Administered 2012-01-18: 30000 [IU] via SUBCUTANEOUS

## 2012-01-18 MED ORDER — EPOETIN ALFA 10000 UNIT/ML IJ SOLN
INTRAMUSCULAR | Status: AC
  Filled 2012-01-18: qty 1

## 2012-01-18 MED ORDER — EPOETIN ALFA 20000 UNIT/ML IJ SOLN
INTRAMUSCULAR | Status: AC
  Filled 2012-01-18: qty 1

## 2012-01-23 ENCOUNTER — Other Ambulatory Visit: Payer: Self-pay | Admitting: Internal Medicine

## 2012-01-23 MED ORDER — ATENOLOL 100 MG PO TABS: 100.0000 mg | ORAL_TABLET | Freq: Two times a day (BID) | ORAL | Status: DC

## 2012-01-23 MED ORDER — AMIODARONE HCL 200 MG PO TABS: 200.0000 mg | ORAL_TABLET | Freq: Every day | ORAL | Status: DC

## 2012-01-23 NOTE — Telephone Encounter (Signed)
Pt need the above mentioned Rx called into [facility] for a 90 day supply

## 2012-02-14 ENCOUNTER — Ambulatory Visit (INDEPENDENT_AMBULATORY_CARE_PROVIDER_SITE_OTHER): Payer: Medicare Other | Admitting: Nurse Practitioner

## 2012-02-14 ENCOUNTER — Encounter: Payer: Self-pay | Admitting: Nurse Practitioner

## 2012-02-14 VITALS — BP 132/74 | HR 67 | Ht 62.0 in | Wt 191.0 lb

## 2012-02-14 DIAGNOSIS — I472 Ventricular tachycardia: Secondary | ICD-10-CM

## 2012-02-14 DIAGNOSIS — Z79899 Other long term (current) drug therapy: Secondary | ICD-10-CM

## 2012-02-14 LAB — CBC WITH DIFFERENTIAL/PLATELET
Basophils Absolute: 0 10*3/uL (ref 0.0–0.1)
Basophils Relative: 0.2 % (ref 0.0–3.0)
Eosinophils Absolute: 0.1 10*3/uL (ref 0.0–0.7)
Eosinophils Relative: 1.5 % (ref 0.0–5.0)
HCT: 28.2 % — ABNORMAL LOW (ref 36.0–46.0)
Hemoglobin: 9 g/dL — ABNORMAL LOW (ref 12.0–15.0)
Lymphocytes Relative: 26.4 % (ref 12.0–46.0)
Lymphs Abs: 1.8 10*3/uL (ref 0.7–4.0)
MCHC: 32 g/dL (ref 30.0–36.0)
MCV: 91.1 fl (ref 78.0–100.0)
Monocytes Absolute: 0.5 10*3/uL (ref 0.1–1.0)
Monocytes Relative: 7.4 % (ref 3.0–12.0)
Neutro Abs: 4.5 10*3/uL (ref 1.4–7.7)
Neutrophils Relative %: 64.5 % (ref 43.0–77.0)
Platelets: 132 10*3/uL — ABNORMAL LOW (ref 150.0–400.0)
RBC: 3.1 Mil/uL — ABNORMAL LOW (ref 3.87–5.11)
RDW: 19.5 % — ABNORMAL HIGH (ref 11.5–14.6)
WBC: 6.9 10*3/uL (ref 4.5–10.5)

## 2012-02-14 LAB — BASIC METABOLIC PANEL
BUN: 27 mg/dL — ABNORMAL HIGH (ref 6–23)
CO2: 22 mEq/L (ref 19–32)
Calcium: 9.4 mg/dL (ref 8.4–10.5)
Chloride: 108 mEq/L (ref 96–112)
Creatinine, Ser: 1.3 mg/dL — ABNORMAL HIGH (ref 0.4–1.2)
GFR: 40.27 mL/min — ABNORMAL LOW (ref >60.00)
Glucose, Bld: 183 mg/dL — ABNORMAL HIGH (ref 70–99)
Potassium: 4.4 mEq/L (ref 3.5–5.1)
Sodium: 140 mEq/L (ref 135–145)

## 2012-02-14 LAB — HEPATIC FUNCTION PANEL
ALT: 19 U/L (ref 0–35)
AST: 27 U/L (ref 0–37)
Albumin: 3.2 g/dL — ABNORMAL LOW (ref 3.5–5.2)
Alkaline Phosphatase: 79 U/L (ref 39–117)
Bilirubin, Direct: 0.1 mg/dL (ref 0.0–0.3)
Total Bilirubin: 0.4 mg/dL (ref 0.3–1.2)
Total Protein: 7.2 g/dL (ref 6.0–8.3)

## 2012-02-14 LAB — LIPID PANEL
Cholesterol: 111 mg/dL (ref 0–200)
HDL: 23.7 mg/dL — ABNORMAL LOW (ref >39.00)
LDL Cholesterol: 64 mg/dL (ref 0–99)
Total CHOL/HDL Ratio: 5
Triglycerides: 116 mg/dL (ref 0.0–149.0)
VLDL: 23.2 mg/dL (ref 0.0–40.0)

## 2012-02-14 LAB — TSH: TSH: 4.41 u[IU]/mL (ref 0.35–5.50)

## 2012-02-14 NOTE — Patient Instructions (Signed)
We need to check some lab today  We are going to send you for the breathing test  We will see you back in March with Dr. Rayann Heman  Stay on your current medicines.  Call the [facility] office at [phone] if you have any questions, problems or concerns.

## 2012-02-14 NOTE — Progress Notes (Addendum)
Leslie Coleman Date of Birth: 1928/06/09 Medical Record [id number]  History of Present Illness: Leslie Coleman is seen back today for a follow up visit. She is seen for Dr. Rayann Heman. She is a former patient of Dr. Susa Simmonds. She has a history of remote VTach. Treated initially with procainamide but switched to amiodarone back in 2008 due to unavailability of the the medicine. She does have chronically abnormal PFT's. Her other problems include HTN, gout, obesity, DM and anemia. She is on chronic Procrit.  She comes in today. She is doing pretty well. She got tripped up at church back in the summer and hurt her knee. Did not fall. Required drainage of her knee. No chest pain. Not short of breath. Not dizzy or lightheaded. No syncope. Seems to be tolerating her regimen without issue.   Current Outpatient Prescriptions on File Prior to Visit  Medication Sig Dispense Refill  . acetaminophen (TYLENOL) 500 MG tablet Take 500 mg by mouth as needed.        Marland Kitchen allopurinol (ZYLOPRIM) 300 MG tablet Take 1 tablet (300 mg total) by mouth daily.  90 tablet  3  . amiodarone (PACERONE) 200 MG tablet Take 1 tablet (200 mg total) by mouth daily.  30 tablet  2  . amLODipine (NORVASC) 10 MG tablet Take 10 mg by mouth daily.        Marland Kitchen atenolol (TENORMIN) 100 MG tablet Take 1 tablet (100 mg total) by mouth 2 (two) times daily.  60 tablet  2  . Epoetin Alfa (PROCRIT IJ) Inject 30,000 Units as directed every 6 (six) weeks. As directed every 6 weeks.      Marland Kitchen glyBURIDE-metformin (GLUCOVANCE) 5-500 MG per tablet Take 1 tablet by mouth 2 (two) times daily with a meal.        . hydrALAZINE (APRESOLINE) 25 MG tablet Take 1 tablet (25 mg total) by mouth 2 (two) times daily.  180 tablet  3  . Iron-Vitamins (GERITOL PO) Take by mouth every morning.       Marland Kitchen levothyroxine (SYNTHROID, LEVOTHROID) 25 MCG tablet Take 25 mcg by mouth as directed. Taking one tablet daily Monday-Friday  and two tablets daily Saturday and sunday      . Multiple  Vitamins-Iron (MULTIVITAMINS WITH IRON) TABS Take 1 tablet by mouth daily.        Marland Kitchen omega-3 acid ethyl esters (LOVAZA) 1 G capsule Take 2 capsules (2 g total) by mouth 2 (two) times daily.  360 capsule  3  . Pioglitazone HCl (ACTOS PO) Take 1 tablet by mouth daily.      . potassium chloride (KLOR-CON) 10 MEQ CR tablet Take 1 tablet (10 mEq total) by mouth daily.  90 tablet  3  . pravastatin (PRAVACHOL) 20 MG tablet Take 1 tablet (20 mg total) by mouth daily.  90 tablet  3  . telmisartan-hydrochlorothiazide (MICARDIS HCT) 80-12.5 MG per tablet Take 1 tablet by mouth daily.        Marland Kitchen DISCONTD: allopurinol (ZYLOPRIM) 300 MG tablet Take 1 tablet (300 mg total) by mouth daily.  90 tablet  3    No Known Allergies  Past Medical History  Diagnosis Date  . V-tach     remote VT, treated initially with procainamide, on amiodarone since 2008  . HTN (hypertension)     Last echo in 2004; EF normal  . Hyperlipidemia   . Diabetes mellitus   . Obesity   . Hypothyroidism     on replacement  . Anemia  treated with Procrit  . Gout   . Abnormal PFTs (pulmonary function tests) 2008    No obstruction noted. Has been followed clinically.  . High risk medication use     on amiodarone.    Past Surgical History  Procedure Date  . Cardioversion 1978  . Cesarean section     x 3  . Abdominal hysterectomy   . US echocardiography 12/29/2002    EF 60-65%  . Cardiovascular stress test 07/07/2003    EF 70%. NO EVIDENCE OF ISCHEMIA    History  Smoking status  . Never Smoker   Smokeless tobacco  . Never Used    History  Alcohol Use No    Family History  Problem Relation Age of Onset  . Heart disease Mother   . Heart disease Father     Review of Systems: The review of systems is per the HPI.  All other systems were reviewed and are negative.  Physical Exam: BP 132/74  Pulse 67  Ht 5\' 2"  (1.575 m)  Wt 191 lb (86.637 kg)  BMI 34.93 kg/m2 Patient is very pleasant and in no acute distress.  Skin is warm and dry. Color is normal.  HEENT is unremarkable. Normocephalic/atraumatic. PERRL. Sclera are nonicteric. Neck is supple. No masses. No JVD. Lungs are clear. Cardiac exam shows a regular rate and rhythm. Abdomen is soft. Extremities are without edema. Gait and ROM are intact. No gross neurologic deficits noted.   LABORATORY DATA: PENDING FOR TODAY  Lab Results  Component Value Date   WBC 6.5 07/07/2011   HGB 9.6* 01/18/2012   HCT 28.7* 07/07/2011   PLT 118* 07/07/2011   GLUCOSE 188* 07/06/2011   CHOL 119 07/07/2011   TRIG 156* 07/07/2011   HDL 24* 07/07/2011   LDLCALC 64 07/07/2011   ALT 16 07/06/2011   AST 24 07/06/2011   NA 138 07/06/2011   K 4.1 07/06/2011   CL 105 07/06/2011   CREATININE 0.89 07/07/2011   BUN 28* 07/06/2011   CO2 20 07/06/2011   TSH 3.49 05/02/2011   HGBA1C 6.2* 07/07/2011     Assessment / Plan: 1. History of VTACH - on amiodarone. Needs her follow up labs and PFTS  2. HTN - she is on multiple medicines. BP looks good. She feels good on her regimen.   3. Anemia - she remains on her Procrit.  Overall, she looks good. No change in her medicines. Will check labs today. I have signed her refills for her. Will see her back in 6 months. Patient is agreeable to this plan and will call if any problems develop in the interim.    Addendum:  FYI: [facility]ul[facility]. Function test [phone]  Pt sched to pulmonary function test 02/19/12, wasn't able to complete test. Pt did not have enough understanding of testing technique and also SOB.

## 2012-02-15 ENCOUNTER — Encounter (HOSPITAL_COMMUNITY)
Admission: RE | Admit: 2012-02-15 | Discharge: 2012-02-15 | Disposition: A | Discharge status: Home / Self Care | Payer: Medicare Other | Source: Ambulatory Visit | Attending: Internal Medicine | Admitting: Internal Medicine

## 2012-02-15 DIAGNOSIS — D638 Anemia in other chronic diseases classified elsewhere: Secondary | ICD-10-CM | POA: Diagnosis not present

## 2012-02-15 DIAGNOSIS — N2581 Secondary hyperparathyroidism of renal origin: Secondary | ICD-10-CM | POA: Insufficient documentation

## 2012-02-15 DIAGNOSIS — N289 Disorder of kidney and ureter, unspecified: Secondary | ICD-10-CM | POA: Diagnosis not present

## 2012-02-15 LAB — POCT HEMOGLOBIN-HEMACUE: Hemoglobin: 9.3 g/dL — ABNORMAL LOW (ref 12.0–15.0)

## 2012-02-15 MED ORDER — EPOETIN ALFA 20000 UNIT/ML IJ SOLN
INTRAMUSCULAR | Status: AC
  Administered 2012-02-15: 20000 [IU]
  Filled 2012-02-15: qty 1

## 2012-02-15 MED ORDER — EPOETIN ALFA 10000 UNIT/ML IJ SOLN
30000.0000 [IU] | INTRAMUSCULAR | Status: DC
  Administered 2012-02-15: 10000 [IU] via SUBCUTANEOUS

## 2012-02-15 MED ORDER — EPOETIN ALFA 10000 UNIT/ML IJ SOLN
INTRAMUSCULAR | Status: AC
  Filled 2012-02-15: qty 1

## 2012-02-16 ENCOUNTER — Telehealth: Payer: Self-pay | Admitting: *Deleted

## 2012-02-16 NOTE — Telephone Encounter (Signed)
Called to discuss labs, pt aware that she will make no medication changes and informed me she just had her Procrit injection yesterday.  Concepcion Living, CMA

## 2012-02-16 NOTE — Telephone Encounter (Signed)
Message copied by Iona Hansen on Fri Feb 16, 2012  1:34 PM ------      Message from: Burtis Junes      Created: Wed Feb 14, 2012  4:31 PM       Ok to report. Labs are satisfactory and at her baseline. She is on Procrit. No change with her thyroid medicine.

## 2012-02-19 ENCOUNTER — Encounter (INDEPENDENT_AMBULATORY_CARE_PROVIDER_SITE_OTHER): Payer: Medicare Other

## 2012-02-19 DIAGNOSIS — R942 Abnormal results of pulmonary function studies: Secondary | ICD-10-CM

## 2012-02-23 ENCOUNTER — Telehealth: Payer: Self-pay | Admitting: Nurse Practitioner

## 2012-02-23 NOTE — Telephone Encounter (Signed)
FYI: [facility]-Pulm. Function test [phone]  Pt sched to pulmonary function test 02/19/12, wasn't able to complete test.  Pt did not have enough understanding of testing technique and also SOB.

## 2012-03-14 ENCOUNTER — Encounter (HOSPITAL_COMMUNITY)
Admission: RE | Admit: 2012-03-14 | Discharge: 2012-03-14 | Disposition: A | Discharge status: Home / Self Care | Payer: Medicare Other | Source: Ambulatory Visit | Attending: Internal Medicine | Admitting: Internal Medicine

## 2012-03-14 DIAGNOSIS — N289 Disorder of kidney and ureter, unspecified: Secondary | ICD-10-CM | POA: Diagnosis not present

## 2012-03-14 DIAGNOSIS — D638 Anemia in other chronic diseases classified elsewhere: Secondary | ICD-10-CM | POA: Diagnosis not present

## 2012-03-14 DIAGNOSIS — N2581 Secondary hyperparathyroidism of renal origin: Secondary | ICD-10-CM | POA: Insufficient documentation

## 2012-03-14 MED ORDER — EPOETIN ALFA 10000 UNIT/ML IJ SOLN
INTRAMUSCULAR | Status: AC
  Administered 2012-03-14: 10000 [IU] via SUBCUTANEOUS
  Filled 2012-03-14: qty 1

## 2012-03-14 MED ORDER — EPOETIN ALFA 10000 UNIT/ML IJ SOLN: 30000.0000 [IU] | INTRAMUSCULAR | Status: DC

## 2012-03-14 MED ORDER — EPOETIN ALFA 20000 UNIT/ML IJ SOLN
INTRAMUSCULAR | Status: AC
  Administered 2012-03-14: 20000 [IU] via SUBCUTANEOUS
  Filled 2012-03-14: qty 1

## 2012-04-04 DIAGNOSIS — E782 Mixed hyperlipidemia: Secondary | ICD-10-CM | POA: Diagnosis not present

## 2012-04-04 DIAGNOSIS — Z0000 Encounter for general adult medical examination without abnormal findings: Secondary | ICD-10-CM | POA: Diagnosis not present

## 2012-04-04 DIAGNOSIS — R809 Proteinuria, unspecified: Secondary | ICD-10-CM | POA: Diagnosis not present

## 2012-04-04 DIAGNOSIS — Z23 Encounter for immunization: Secondary | ICD-10-CM | POA: Diagnosis not present

## 2012-04-04 DIAGNOSIS — E119 Type 2 diabetes mellitus without complications: Secondary | ICD-10-CM | POA: Diagnosis not present

## 2012-04-04 DIAGNOSIS — I10 Essential (primary) hypertension: Secondary | ICD-10-CM | POA: Diagnosis not present

## 2012-04-11 ENCOUNTER — Encounter (HOSPITAL_COMMUNITY)
Admission: RE | Admit: 2012-04-11 | Discharge: 2012-04-11 | Disposition: A | Discharge status: Home / Self Care | Payer: Medicare Other | Source: Ambulatory Visit | Attending: Internal Medicine | Admitting: Internal Medicine

## 2012-04-11 DIAGNOSIS — N289 Disorder of kidney and ureter, unspecified: Secondary | ICD-10-CM | POA: Insufficient documentation

## 2012-04-11 DIAGNOSIS — N2581 Secondary hyperparathyroidism of renal origin: Secondary | ICD-10-CM | POA: Diagnosis not present

## 2012-04-11 DIAGNOSIS — M109 Gout, unspecified: Secondary | ICD-10-CM | POA: Diagnosis not present

## 2012-04-11 DIAGNOSIS — N390 Urinary tract infection, site not specified: Secondary | ICD-10-CM | POA: Diagnosis not present

## 2012-04-11 DIAGNOSIS — D638 Anemia in other chronic diseases classified elsewhere: Secondary | ICD-10-CM | POA: Diagnosis not present

## 2012-04-11 DIAGNOSIS — E119 Type 2 diabetes mellitus without complications: Secondary | ICD-10-CM | POA: Diagnosis not present

## 2012-04-11 DIAGNOSIS — E2839 Other primary ovarian failure: Secondary | ICD-10-CM | POA: Diagnosis not present

## 2012-04-11 DIAGNOSIS — H908 Mixed conductive and sensorineural hearing loss, unspecified: Secondary | ICD-10-CM | POA: Diagnosis not present

## 2012-04-11 DIAGNOSIS — I10 Essential (primary) hypertension: Secondary | ICD-10-CM | POA: Diagnosis not present

## 2012-04-11 DIAGNOSIS — E782 Mixed hyperlipidemia: Secondary | ICD-10-CM | POA: Diagnosis not present

## 2012-04-11 MED ORDER — EPOETIN ALFA 10000 UNIT/ML IJ SOLN
30000.0000 [IU] | INTRAMUSCULAR | Status: DC
  Administered 2012-04-11: 10000 [IU] via SUBCUTANEOUS

## 2012-04-11 MED ORDER — EPOETIN ALFA 20000 UNIT/ML IJ SOLN
INTRAMUSCULAR | Status: AC
  Administered 2012-04-11: 20000 [IU] via SUBCUTANEOUS
  Filled 2012-04-11: qty 1

## 2012-04-11 MED ORDER — EPOETIN ALFA 10000 UNIT/ML IJ SOLN
INTRAMUSCULAR | Status: AC
  Administered 2012-04-11: 10000 [IU] via SUBCUTANEOUS
  Filled 2012-04-11: qty 1

## 2012-04-16 ENCOUNTER — Other Ambulatory Visit: Payer: Self-pay | Admitting: Internal Medicine

## 2012-04-16 MED ORDER — AMIODARONE HCL 200 MG PO TABS: 200.0000 mg | ORAL_TABLET | Freq: Every day | ORAL | Status: DC

## 2012-04-16 NOTE — Telephone Encounter (Signed)
Needs refill

## 2012-05-08 DIAGNOSIS — H35059 Retinal neovascularization, unspecified, unspecified eye: Secondary | ICD-10-CM | POA: Diagnosis not present

## 2012-05-08 DIAGNOSIS — H353290 Exudative age-related macular degeneration, unspecified eye, stage unspecified: Secondary | ICD-10-CM | POA: Diagnosis not present

## 2012-05-08 DIAGNOSIS — H353190 Nonexudative age-related macular degeneration, unspecified eye, stage unspecified: Secondary | ICD-10-CM | POA: Diagnosis not present

## 2012-05-08 DIAGNOSIS — E11329 Type 2 diabetes mellitus with mild nonproliferative diabetic retinopathy without macular edema: Secondary | ICD-10-CM | POA: Diagnosis not present

## 2012-05-09 ENCOUNTER — Encounter (HOSPITAL_COMMUNITY)
Admission: RE | Admit: 2012-05-09 | Discharge: 2012-05-09 | Disposition: A | Discharge status: Home / Self Care | Payer: Medicare Other | Source: Ambulatory Visit | Attending: Internal Medicine | Admitting: Internal Medicine

## 2012-05-09 DIAGNOSIS — N2581 Secondary hyperparathyroidism of renal origin: Secondary | ICD-10-CM | POA: Insufficient documentation

## 2012-05-09 DIAGNOSIS — N289 Disorder of kidney and ureter, unspecified: Secondary | ICD-10-CM | POA: Insufficient documentation

## 2012-05-09 DIAGNOSIS — D638 Anemia in other chronic diseases classified elsewhere: Secondary | ICD-10-CM | POA: Insufficient documentation

## 2012-05-09 MED ORDER — EPOETIN ALFA 10000 UNIT/ML IJ SOLN: 30000.0000 [IU] | INTRAMUSCULAR | Status: DC

## 2012-05-09 MED ORDER — EPOETIN ALFA 10000 UNIT/ML IJ SOLN
INTRAMUSCULAR | Status: AC
  Administered 2012-05-09: 30000 [IU] via SUBCUTANEOUS
  Filled 2012-05-09: qty 1

## 2012-05-27 DIAGNOSIS — H353290 Exudative age-related macular degeneration, unspecified eye, stage unspecified: Secondary | ICD-10-CM | POA: Diagnosis not present

## 2012-06-03 ENCOUNTER — Other Ambulatory Visit (HOSPITAL_COMMUNITY): Payer: Self-pay | Admitting: *Deleted

## 2012-06-06 ENCOUNTER — Encounter (HOSPITAL_COMMUNITY): Payer: Medicare Other

## 2012-06-13 ENCOUNTER — Encounter (HOSPITAL_COMMUNITY)
Admission: RE | Admit: 2012-06-13 | Discharge: 2012-06-13 | Disposition: A | Discharge status: Home / Self Care | Payer: Medicare Other | Source: Ambulatory Visit | Attending: Internal Medicine | Admitting: Internal Medicine

## 2012-06-13 DIAGNOSIS — N2581 Secondary hyperparathyroidism of renal origin: Secondary | ICD-10-CM | POA: Insufficient documentation

## 2012-06-13 DIAGNOSIS — N289 Disorder of kidney and ureter, unspecified: Secondary | ICD-10-CM | POA: Insufficient documentation

## 2012-06-13 DIAGNOSIS — D638 Anemia in other chronic diseases classified elsewhere: Secondary | ICD-10-CM | POA: Insufficient documentation

## 2012-06-13 LAB — POCT HEMOGLOBIN-HEMACUE: Hemoglobin: 9.9 g/dL — ABNORMAL LOW (ref 12.0–15.0)

## 2012-06-13 MED ORDER — EPOETIN ALFA 10000 UNIT/ML IJ SOLN
INTRAMUSCULAR | Status: AC
  Administered 2012-06-13: 10000 [IU] via SUBCUTANEOUS
  Filled 2012-06-13: qty 1

## 2012-06-13 MED ORDER — EPOETIN ALFA 10000 UNIT/ML IJ SOLN
30000.0000 [IU] | INTRAMUSCULAR | Status: DC
Start: 2012-06-13 — End: 2012-06-14

## 2012-06-13 MED ORDER — EPOETIN ALFA 20000 UNIT/ML IJ SOLN
INTRAMUSCULAR | Status: AC
  Administered 2012-06-13: 20000 [IU] via SUBCUTANEOUS
  Filled 2012-06-13: qty 1

## 2012-06-24 DIAGNOSIS — H35059 Retinal neovascularization, unspecified, unspecified eye: Secondary | ICD-10-CM | POA: Diagnosis not present

## 2012-06-24 DIAGNOSIS — H43819 Vitreous degeneration, unspecified eye: Secondary | ICD-10-CM | POA: Diagnosis not present

## 2012-06-24 DIAGNOSIS — H353290 Exudative age-related macular degeneration, unspecified eye, stage unspecified: Secondary | ICD-10-CM | POA: Diagnosis not present

## 2012-06-24 DIAGNOSIS — H353190 Nonexudative age-related macular degeneration, unspecified eye, stage unspecified: Secondary | ICD-10-CM | POA: Diagnosis not present

## 2012-07-11 ENCOUNTER — Encounter (HOSPITAL_COMMUNITY)
Admission: RE | Admit: 2012-07-11 | Discharge: 2012-07-11 | Disposition: A | Discharge status: Home / Self Care | Payer: Medicare Other | Source: Ambulatory Visit | Attending: Internal Medicine | Admitting: Internal Medicine

## 2012-07-11 DIAGNOSIS — D638 Anemia in other chronic diseases classified elsewhere: Secondary | ICD-10-CM | POA: Insufficient documentation

## 2012-07-11 DIAGNOSIS — N2581 Secondary hyperparathyroidism of renal origin: Secondary | ICD-10-CM | POA: Insufficient documentation

## 2012-07-11 DIAGNOSIS — N289 Disorder of kidney and ureter, unspecified: Secondary | ICD-10-CM | POA: Diagnosis not present

## 2012-07-11 LAB — POCT HEMOGLOBIN-HEMACUE: Hemoglobin: 10.2 g/dL — ABNORMAL LOW (ref 12.0–15.0)

## 2012-07-11 MED ORDER — EPOETIN ALFA 10000 UNIT/ML IJ SOLN
30000.0000 [IU] | INTRAMUSCULAR | Status: DC
  Administered 2012-07-11: 10000 [IU] via SUBCUTANEOUS

## 2012-07-11 MED ORDER — EPOETIN ALFA 20000 UNIT/ML IJ SOLN
INTRAMUSCULAR | Status: AC
  Administered 2012-07-11: 20000 [IU] via SUBCUTANEOUS
  Filled 2012-07-11: qty 1

## 2012-07-11 MED ORDER — EPOETIN ALFA 10000 UNIT/ML IJ SOLN
INTRAMUSCULAR | Status: AC
  Administered 2012-07-11: 10000 [IU] via SUBCUTANEOUS
  Filled 2012-07-11: qty 1

## 2012-07-17 DIAGNOSIS — I10 Essential (primary) hypertension: Secondary | ICD-10-CM | POA: Diagnosis not present

## 2012-07-17 DIAGNOSIS — M109 Gout, unspecified: Secondary | ICD-10-CM | POA: Diagnosis not present

## 2012-07-17 DIAGNOSIS — E119 Type 2 diabetes mellitus without complications: Secondary | ICD-10-CM | POA: Diagnosis not present

## 2012-07-17 DIAGNOSIS — E782 Mixed hyperlipidemia: Secondary | ICD-10-CM | POA: Diagnosis not present

## 2012-07-23 DIAGNOSIS — M109 Gout, unspecified: Secondary | ICD-10-CM | POA: Diagnosis not present

## 2012-07-23 DIAGNOSIS — E119 Type 2 diabetes mellitus without complications: Secondary | ICD-10-CM | POA: Diagnosis not present

## 2012-07-23 DIAGNOSIS — D649 Anemia, unspecified: Secondary | ICD-10-CM | POA: Diagnosis not present

## 2012-08-08 ENCOUNTER — Encounter (HOSPITAL_COMMUNITY)
Admission: RE | Admit: 2012-08-08 | Discharge: 2012-08-08 | Disposition: A | Discharge status: Home / Self Care | Payer: Medicare Other | Source: Ambulatory Visit | Attending: Internal Medicine | Admitting: Internal Medicine

## 2012-08-08 DIAGNOSIS — N2581 Secondary hyperparathyroidism of renal origin: Secondary | ICD-10-CM | POA: Insufficient documentation

## 2012-08-08 DIAGNOSIS — N289 Disorder of kidney and ureter, unspecified: Secondary | ICD-10-CM | POA: Insufficient documentation

## 2012-08-08 DIAGNOSIS — D638 Anemia in other chronic diseases classified elsewhere: Secondary | ICD-10-CM | POA: Diagnosis not present

## 2012-08-08 LAB — POCT HEMOGLOBIN-HEMACUE: Hemoglobin: 10.6 g/dL — ABNORMAL LOW (ref 12.0–15.0)

## 2012-08-08 MED ORDER — EPOETIN ALFA 10000 UNIT/ML IJ SOLN
INTRAMUSCULAR | Status: AC
  Administered 2012-08-08: 10000 [IU] via SUBCUTANEOUS
  Filled 2012-08-08: qty 1

## 2012-08-08 MED ORDER — EPOETIN ALFA 20000 UNIT/ML IJ SOLN
INTRAMUSCULAR | Status: AC
  Administered 2012-08-08: 20000 [IU] via SUBCUTANEOUS
  Filled 2012-08-08: qty 1

## 2012-08-08 MED ORDER — EPOETIN ALFA 10000 UNIT/ML IJ SOLN: 30000.0000 [IU] | INTRAMUSCULAR | Status: DC

## 2012-08-12 DIAGNOSIS — H43819 Vitreous degeneration, unspecified eye: Secondary | ICD-10-CM | POA: Diagnosis not present

## 2012-08-12 DIAGNOSIS — H35059 Retinal neovascularization, unspecified, unspecified eye: Secondary | ICD-10-CM | POA: Diagnosis not present

## 2012-08-12 DIAGNOSIS — H353190 Nonexudative age-related macular degeneration, unspecified eye, stage unspecified: Secondary | ICD-10-CM | POA: Diagnosis not present

## 2012-08-12 DIAGNOSIS — H353290 Exudative age-related macular degeneration, unspecified eye, stage unspecified: Secondary | ICD-10-CM | POA: Diagnosis not present

## 2012-08-14 ENCOUNTER — Ambulatory Visit (INDEPENDENT_AMBULATORY_CARE_PROVIDER_SITE_OTHER): Payer: Medicare Other | Admitting: Internal Medicine

## 2012-08-14 ENCOUNTER — Encounter: Payer: Self-pay | Admitting: Internal Medicine

## 2012-08-14 VITALS — BP 130/62 | HR 85 | Ht 62.0 in | Wt 178.0 lb

## 2012-08-14 DIAGNOSIS — I4891 Unspecified atrial fibrillation: Secondary | ICD-10-CM | POA: Diagnosis not present

## 2012-08-14 LAB — HEPATIC FUNCTION PANEL
ALT: 28 U/L (ref 0–35)
Total Bilirubin: 0.4 mg/dL (ref 0.3–1.2)

## 2012-08-14 LAB — TSH: TSH: 3.58 u[IU]/mL (ref 0.35–5.50)

## 2012-08-14 MED ORDER — AMIODARONE HCL 100 MG PO TABS: 100.0000 mg | ORAL_TABLET | Freq: Every day | ORAL | Status: DC

## 2012-08-14 MED ORDER — ATENOLOL 100 MG PO TABS: 100.0000 mg | ORAL_TABLET | Freq: Two times a day (BID) | ORAL | Status: DC

## 2012-08-14 MED ORDER — AMIODARONE HCL 200 MG PO TABS: 100.0000 mg | ORAL_TABLET | Freq: Every day | ORAL | Status: DC

## 2012-08-14 NOTE — Patient Instructions (Signed)
Your physician wants you to follow-up in: 6 months with Kathrene Alu and 12 months Dr Rayann Heman Dennis Bast will receive a reminder letter in the mail two months in advance. If you don't receive a letter, please call our office to schedule the follow-up appointment.  Your physician has recommended you make the following change in your medication:  1) Decrease Amiodarone to 100mg  daily   Your physician recommends that you return for lab work : [facility]

## 2012-08-14 NOTE — Progress Notes (Signed)
Leslie Coleman is a pleasant 79 y.o. WF patient with a h/o remote ventricular tachycardia followed previously by Dr Leslie Coleman.  She had EP study (per his report) in the 1980s with no inducible VT.  She was treated initially with procainamide but has been on amiodarone since 2008.  She appears to be doing very well and remains active despite her age.  She denies any recent episodes of arrhythmia.  She is doing very well.  Today, she denies symptoms of palpitations, chest pain, shortness of breath, orthopnea, PND, lower extremity edema, presyncope, syncope, dizziness, or neurologic sequela. The patient is tolerating medications without difficulties and is otherwise without complaint today.   Past Medical History  Diagnosis Date  . V-tach     remote VT, treated initially with procainamide, on amiodarone since 2008  . HTN (hypertension)     Last echo in 2004; EF normal  . Hyperlipidemia   . Diabetes mellitus   . Obesity   . Hypothyroidism     on replacement  . Anemia     treated with Procrit  . Gout   . Abnormal PFTs (pulmonary function tests) 2008    No obstruction noted. Has been followed clinically.  . High risk medication use     on amiodarone.   Past Surgical History  Procedure Laterality Date  . Cardioversion  1978  . Cesarean section      x 3  . Abdominal hysterectomy    . US echocardiography  12/29/2002    EF 60-65%  . Cardiovascular stress test  07/07/2003    EF 70%. NO EVIDENCE OF ISCHEMIA    Current Outpatient Prescriptions  Medication Sig Dispense Refill  . acetaminophen (TYLENOL) 500 MG tablet Take 500 mg by mouth as needed.        Marland Kitchen allopurinol (ZYLOPRIM) 300 MG tablet Take 1 tablet (300 mg total) by mouth daily.  90 tablet  3  . amiodarone (PACERONE) 200 MG tablet Take 1 tablet (200 mg total) by mouth daily.  90 tablet  1  . amLODipine (NORVASC) 10 MG tablet Take 10 mg by mouth daily.        Marland Kitchen atenolol (TENORMIN) 100 MG tablet Take 1 tablet (100 mg total) by mouth 2  (two) times daily.  60 tablet  2  . diclofenac (VOLTAREN) 75 MG EC tablet       . Epoetin Alfa (PROCRIT IJ) Inject 30,000 Units as directed every 6 (six) weeks. As directed every 6 weeks.      Marland Kitchen glipiZIDE (GLUCOTROL) 5 MG tablet Take 5 mg by mouth daily.      . hydrALAZINE (APRESOLINE) 25 MG tablet Take 1 tablet (25 mg total) by mouth 2 (two) times daily.  180 tablet  3  . Iron-Vitamins (GERITOL PO) Take by mouth every morning.       Marland Kitchen levothyroxine (SYNTHROID, LEVOTHROID) 25 MCG tablet Take 25 mcg by mouth as directed. Taking one tablet daily Monday-Friday  and two tablets daily Saturday and sunday      . Multiple Vitamins-Iron (MULTIVITAMINS WITH IRON) TABS Take 1 tablet by mouth daily.        Marland Kitchen omega-3 acid ethyl esters (LOVAZA) 1 G capsule Take 2 capsules (2 g total) by mouth 2 (two) times daily.  360 capsule  3  . potassium chloride (KLOR-CON) 10 MEQ CR tablet Take 1 tablet (10 mEq total) by mouth daily.  90 tablet  3  . pravastatin (PRAVACHOL) 20 MG tablet Take 1 tablet (20  mg total) by mouth daily.  90 tablet  3  . telmisartan-hydrochlorothiazide (MICARDIS HCT) 80-12.5 MG per tablet Take 1 tablet by mouth daily.        . traMADol-acetaminophen (ULTRACET) 37.5-325 MG per tablet        No current facility-administered medications for this visit.    No Known Allergies  History   Social History  . Marital Status: Married    Spouse Name: N/A    Number of Children: N/A  . Years of Education: N/A   Occupational History  . Not on file.   Social History Main Topics  . Smoking status: Never Smoker   . Smokeless tobacco: Never Used  . Alcohol Use: No  . Drug Use: No  . Sexually Active: Not Currently   Other Topics Concern  . Not on file   Social History Narrative   Lives with spouse in [place]    Family History  Problem Relation Age of Onset  . Heart disease Mother   . Heart disease Father     Physical Exam: Filed Vitals:   08/14/12 0910  BP: 130/62  Pulse: 85   Height: 5\' 2"  (1.575 m)  Weight: 178 lb (80.74 kg)  SpO2: 99%    GEN- The patient is well appearing, alert and oriented x 3 today.   Head- normocephalic, atraumatic Eyes-  Sclera clear, conjunctiva pink Ears- hearing intact Oropharynx- clear Neck- supple, no JVP Lymph- no cervical lymphadenopathy Lungs- Clear to ausculation bilaterally, normal work of breathing Heart- Regular rate and rhythm, no murmurs, rubs or gallops, PMI not laterally displaced GI- soft, NT, ND, + BS Extremities- no clubbing, cyanosis, or edema Neuro- resting tremor  EKG today reveals sinus rhythm 85 bpm, otherwise normal ekg  Assessment and Plan:  1. VT Controlled with amiodarone Decrease amiodarone to 100mg  daily today lfts and TFTs today  2. HTN Stable No change required today  Return to see Leslie Coleman in 6 months I will see again in 1 year

## 2012-09-05 ENCOUNTER — Encounter (HOSPITAL_COMMUNITY)
Admission: RE | Admit: 2012-09-05 | Discharge: 2012-09-05 | Disposition: A | Discharge status: Home / Self Care | Payer: Medicare Other | Source: Ambulatory Visit | Attending: Internal Medicine | Admitting: Internal Medicine

## 2012-09-05 DIAGNOSIS — D638 Anemia in other chronic diseases classified elsewhere: Secondary | ICD-10-CM | POA: Insufficient documentation

## 2012-09-05 DIAGNOSIS — N2581 Secondary hyperparathyroidism of renal origin: Secondary | ICD-10-CM | POA: Insufficient documentation

## 2012-09-05 DIAGNOSIS — N289 Disorder of kidney and ureter, unspecified: Secondary | ICD-10-CM | POA: Diagnosis not present

## 2012-09-05 LAB — POCT HEMOGLOBIN-HEMACUE: Hemoglobin: 10.8 g/dL — ABNORMAL LOW (ref 12.0–15.0)

## 2012-09-05 MED ORDER — EPOETIN ALFA 10000 UNIT/ML IJ SOLN: 30000.0000 [IU] | INTRAMUSCULAR | Status: DC

## 2012-09-05 MED ORDER — EPOETIN ALFA 20000 UNIT/ML IJ SOLN
INTRAMUSCULAR | Status: AC
  Administered 2012-09-05: 20000 [IU] via SUBCUTANEOUS
  Filled 2012-09-05: qty 1

## 2012-09-05 MED ORDER — EPOETIN ALFA 10000 UNIT/ML IJ SOLN
INTRAMUSCULAR | Status: AC
  Administered 2012-09-05: 10000 [IU] via SUBCUTANEOUS
  Filled 2012-09-05: qty 1

## 2012-10-03 ENCOUNTER — Encounter (HOSPITAL_COMMUNITY)
Admission: RE | Admit: 2012-10-03 | Discharge: 2012-10-03 | Disposition: A | Discharge status: Home / Self Care | Payer: Medicare Other | Source: Ambulatory Visit | Attending: Internal Medicine | Admitting: Internal Medicine

## 2012-10-03 DIAGNOSIS — D638 Anemia in other chronic diseases classified elsewhere: Secondary | ICD-10-CM | POA: Insufficient documentation

## 2012-10-03 DIAGNOSIS — N289 Disorder of kidney and ureter, unspecified: Secondary | ICD-10-CM | POA: Insufficient documentation

## 2012-10-03 DIAGNOSIS — N2581 Secondary hyperparathyroidism of renal origin: Secondary | ICD-10-CM | POA: Diagnosis not present

## 2012-10-03 LAB — POCT HEMOGLOBIN-HEMACUE: Hemoglobin: 10.9 g/dL — ABNORMAL LOW (ref 12.0–15.0)

## 2012-10-03 MED ORDER — EPOETIN ALFA 10000 UNIT/ML IJ SOLN
INTRAMUSCULAR | Status: AC
  Administered 2012-10-03: 10000 [IU] via SUBCUTANEOUS
  Filled 2012-10-03: qty 1

## 2012-10-03 MED ORDER — EPOETIN ALFA 10000 UNIT/ML IJ SOLN: 30000.0000 [IU] | INTRAMUSCULAR | Status: DC

## 2012-10-03 MED ORDER — EPOETIN ALFA 20000 UNIT/ML IJ SOLN
INTRAMUSCULAR | Status: AC
  Administered 2012-10-03: 20000 [IU] via SUBCUTANEOUS
  Filled 2012-10-03: qty 1

## 2012-10-07 DIAGNOSIS — H353290 Exudative age-related macular degeneration, unspecified eye, stage unspecified: Secondary | ICD-10-CM | POA: Diagnosis not present

## 2012-10-07 DIAGNOSIS — H35059 Retinal neovascularization, unspecified, unspecified eye: Secondary | ICD-10-CM | POA: Diagnosis not present

## 2012-10-07 DIAGNOSIS — H353190 Nonexudative age-related macular degeneration, unspecified eye, stage unspecified: Secondary | ICD-10-CM | POA: Diagnosis not present

## 2012-10-07 DIAGNOSIS — E119 Type 2 diabetes mellitus without complications: Secondary | ICD-10-CM | POA: Diagnosis not present

## 2012-10-22 DIAGNOSIS — I10 Essential (primary) hypertension: Secondary | ICD-10-CM | POA: Diagnosis not present

## 2012-10-22 DIAGNOSIS — E782 Mixed hyperlipidemia: Secondary | ICD-10-CM | POA: Diagnosis not present

## 2012-10-22 DIAGNOSIS — E119 Type 2 diabetes mellitus without complications: Secondary | ICD-10-CM | POA: Diagnosis not present

## 2012-10-29 DIAGNOSIS — I10 Essential (primary) hypertension: Secondary | ICD-10-CM | POA: Diagnosis not present

## 2012-10-29 DIAGNOSIS — Z23 Encounter for immunization: Secondary | ICD-10-CM | POA: Diagnosis not present

## 2012-10-29 DIAGNOSIS — E782 Mixed hyperlipidemia: Secondary | ICD-10-CM | POA: Diagnosis not present

## 2012-10-29 DIAGNOSIS — M109 Gout, unspecified: Secondary | ICD-10-CM | POA: Diagnosis not present

## 2012-10-29 DIAGNOSIS — E119 Type 2 diabetes mellitus without complications: Secondary | ICD-10-CM | POA: Diagnosis not present

## 2012-10-31 ENCOUNTER — Encounter (HOSPITAL_COMMUNITY)
Admission: RE | Admit: 2012-10-31 | Discharge: 2012-10-31 | Disposition: A | Discharge status: Home / Self Care | Payer: Medicare Other | Source: Ambulatory Visit | Attending: Internal Medicine | Admitting: Internal Medicine

## 2012-10-31 DIAGNOSIS — N2581 Secondary hyperparathyroidism of renal origin: Secondary | ICD-10-CM | POA: Diagnosis not present

## 2012-10-31 DIAGNOSIS — N289 Disorder of kidney and ureter, unspecified: Secondary | ICD-10-CM | POA: Insufficient documentation

## 2012-10-31 DIAGNOSIS — D638 Anemia in other chronic diseases classified elsewhere: Secondary | ICD-10-CM | POA: Insufficient documentation

## 2012-10-31 MED ORDER — EPOETIN ALFA 10000 UNIT/ML IJ SOLN: 30000.0000 [IU] | INTRAMUSCULAR | Status: DC

## 2012-10-31 MED ORDER — EPOETIN ALFA 20000 UNIT/ML IJ SOLN
INTRAMUSCULAR | Status: AC
  Administered 2012-10-31: 20000 [IU] via SUBCUTANEOUS
  Filled 2012-10-31: qty 1

## 2012-10-31 MED ORDER — EPOETIN ALFA 10000 UNIT/ML IJ SOLN
INTRAMUSCULAR | Status: AC
  Administered 2012-10-31: 10000 [IU] via SUBCUTANEOUS
  Filled 2012-10-31: qty 1

## 2012-11-25 ENCOUNTER — Other Ambulatory Visit: Payer: Self-pay | Admitting: Internal Medicine

## 2012-11-25 DIAGNOSIS — E785 Hyperlipidemia, unspecified: Secondary | ICD-10-CM

## 2012-11-27 ENCOUNTER — Other Ambulatory Visit (HOSPITAL_COMMUNITY): Payer: Self-pay | Admitting: *Deleted

## 2012-11-28 ENCOUNTER — Encounter (HOSPITAL_COMMUNITY)
Admission: RE | Admit: 2012-11-28 | Discharge: 2012-11-28 | Disposition: A | Discharge status: Home / Self Care | Payer: Medicare Other | Source: Ambulatory Visit | Attending: Internal Medicine | Admitting: Internal Medicine

## 2012-11-28 ENCOUNTER — Other Ambulatory Visit: Payer: Self-pay | Admitting: *Deleted

## 2012-11-28 DIAGNOSIS — D638 Anemia in other chronic diseases classified elsewhere: Secondary | ICD-10-CM | POA: Diagnosis not present

## 2012-11-28 DIAGNOSIS — N2581 Secondary hyperparathyroidism of renal origin: Secondary | ICD-10-CM | POA: Insufficient documentation

## 2012-11-28 DIAGNOSIS — N289 Disorder of kidney and ureter, unspecified: Secondary | ICD-10-CM | POA: Diagnosis not present

## 2012-11-28 DIAGNOSIS — E785 Hyperlipidemia, unspecified: Secondary | ICD-10-CM

## 2012-11-28 MED ORDER — PRAVASTATIN SODIUM 20 MG PO TABS: ORAL_TABLET | ORAL | Status: DC

## 2012-11-28 MED ORDER — EPOETIN ALFA 20000 UNIT/ML IJ SOLN
INTRAMUSCULAR | Status: AC
  Administered 2012-11-28: 20000 [IU] via SUBCUTANEOUS
  Filled 2012-11-28: qty 1

## 2012-11-28 MED ORDER — EPOETIN ALFA 10000 UNIT/ML IJ SOLN: 30000.0000 [IU] | INTRAMUSCULAR | Status: DC

## 2012-11-28 MED ORDER — EPOETIN ALFA 10000 UNIT/ML IJ SOLN
INTRAMUSCULAR | Status: AC
  Administered 2012-11-28: 10000 [IU] via SUBCUTANEOUS
  Filled 2012-11-28: qty 1

## 2012-11-28 NOTE — Telephone Encounter (Signed)
Pt walked in and said she was having problems getting refill of pravastatin. [facility]

## 2012-12-16 DIAGNOSIS — H353290 Exudative age-related macular degeneration, unspecified eye, stage unspecified: Secondary | ICD-10-CM | POA: Diagnosis not present

## 2012-12-16 DIAGNOSIS — H35059 Retinal neovascularization, unspecified, unspecified eye: Secondary | ICD-10-CM | POA: Diagnosis not present

## 2012-12-26 ENCOUNTER — Encounter (HOSPITAL_COMMUNITY)
Admission: RE | Admit: 2012-12-26 | Discharge: 2012-12-26 | Disposition: A | Discharge status: Home / Self Care | Payer: Medicare Other | Source: Ambulatory Visit | Attending: Internal Medicine | Admitting: Internal Medicine

## 2012-12-26 DIAGNOSIS — N2581 Secondary hyperparathyroidism of renal origin: Secondary | ICD-10-CM | POA: Insufficient documentation

## 2012-12-26 DIAGNOSIS — N289 Disorder of kidney and ureter, unspecified: Secondary | ICD-10-CM | POA: Diagnosis not present

## 2012-12-26 DIAGNOSIS — D638 Anemia in other chronic diseases classified elsewhere: Secondary | ICD-10-CM | POA: Insufficient documentation

## 2012-12-26 MED ORDER — EPOETIN ALFA 20000 UNIT/ML IJ SOLN
INTRAMUSCULAR | Status: AC
  Administered 2012-12-26: 20000 [IU] via SUBCUTANEOUS
  Filled 2012-12-26: qty 1

## 2012-12-26 MED ORDER — EPOETIN ALFA 10000 UNIT/ML IJ SOLN: 30000.0000 [IU] | INTRAMUSCULAR | Status: DC

## 2012-12-26 MED ORDER — EPOETIN ALFA 10000 UNIT/ML IJ SOLN
INTRAMUSCULAR | Status: AC
  Administered 2012-12-26: 10000 [IU] via SUBCUTANEOUS
  Filled 2012-12-26: qty 1

## 2013-01-01 ENCOUNTER — Telehealth: Payer: Self-pay | Admitting: *Deleted

## 2013-01-01 NOTE — Telephone Encounter (Signed)
Pt. Sent in a [facility] for [facility] to get allopurinol ( 300 mg ) filled Cecille Rubin signed and will fax that over today # [phone]

## 2013-01-23 ENCOUNTER — Encounter (HOSPITAL_COMMUNITY)
Admission: RE | Admit: 2013-01-23 | Discharge: 2013-01-23 | Disposition: A | Discharge status: Home / Self Care | Payer: Medicare Other | Source: Ambulatory Visit | Attending: Internal Medicine | Admitting: Internal Medicine

## 2013-01-23 DIAGNOSIS — N289 Disorder of kidney and ureter, unspecified: Secondary | ICD-10-CM | POA: Diagnosis not present

## 2013-01-23 DIAGNOSIS — N2581 Secondary hyperparathyroidism of renal origin: Secondary | ICD-10-CM | POA: Insufficient documentation

## 2013-01-23 DIAGNOSIS — D638 Anemia in other chronic diseases classified elsewhere: Secondary | ICD-10-CM | POA: Insufficient documentation

## 2013-01-23 LAB — POCT HEMOGLOBIN-HEMACUE: Hemoglobin: 10.4 g/dL — ABNORMAL LOW (ref 12.0–15.0)

## 2013-01-23 MED ORDER — EPOETIN ALFA 10000 UNIT/ML IJ SOLN
INTRAMUSCULAR | Status: AC
  Administered 2013-01-23: 10000 [IU] via SUBCUTANEOUS
  Filled 2013-01-23: qty 1

## 2013-01-23 MED ORDER — EPOETIN ALFA 20000 UNIT/ML IJ SOLN
INTRAMUSCULAR | Status: AC
  Administered 2013-01-23: 20000 [IU] via SUBCUTANEOUS
  Filled 2013-01-23: qty 1

## 2013-01-23 MED ORDER — EPOETIN ALFA 10000 UNIT/ML IJ SOLN: 30000.0000 [IU] | INTRAMUSCULAR | Status: DC

## 2013-01-24 ENCOUNTER — Encounter: Payer: Self-pay | Admitting: Nurse Practitioner

## 2013-01-24 ENCOUNTER — Ambulatory Visit (INDEPENDENT_AMBULATORY_CARE_PROVIDER_SITE_OTHER): Payer: Medicare Other | Admitting: Nurse Practitioner

## 2013-01-24 VITALS — BP 160/90 | HR 48 | Ht 61.0 in | Wt 186.4 lb

## 2013-01-24 DIAGNOSIS — Z79899 Other long term (current) drug therapy: Secondary | ICD-10-CM

## 2013-01-24 MED ORDER — HYDRALAZINE HCL 50 MG PO TABS: 50.0000 mg | ORAL_TABLET | Freq: Two times a day (BID) | ORAL | Status: DC

## 2013-01-24 NOTE — Patient Instructions (Addendum)
Stay on your current medicines but I am increasing your Hydralazine to 50 mg two times a day (you can take 2 of your 25 mg tablets two times a day and use these up) I have sent the prescription for the 50 mg tablet to [facility]  See your regular doctor next month as planned.  See Dr. Rayann Heman next September  Call the [place] office at [phone] if you have any questions, problems or concerns.

## 2013-01-24 NOTE — Progress Notes (Signed)
Leslie Coleman Date of Birth: Jun 29, 1928 Medical Record [id number]  History of Present Illness: Leslie Coleman is seen back today for her 6 month check. Seen for Dr. Rayann Heman - former patient of Dr. Susa Simmonds. Has a remote history of VT - treated initially with procainamide but switched to amiodarone back in 2008 due to unavailability of the medicine. Does have chronically abnormal PFTs. Other issues include HTN, gout, obesity, DM and anemia. On chronic Procrit.   Last seen here in March and Dr. Rayann Heman cut her amiodarone back to 100 mg per day.  Comes in today. Here alone. Doing well. No chest pain. Not short of breath. Does not check her BP at home. No regular exercise but stays busy with her housework, etc. Seeing her PCP next month and will have all of her labs drawn. Not fasting here today. Has had her medicines today.    Current Outpatient Prescriptions  Medication Sig Dispense Refill  . acetaminophen (TYLENOL) 500 MG tablet Take 500 mg by mouth as needed.        Marland Kitchen allopurinol (ZYLOPRIM) 300 MG tablet Take 1 tablet (300 mg total) by mouth daily.  90 tablet  3  . amiodarone (PACERONE) 100 MG tablet Take 1 tablet (100 mg total) by mouth daily.  90 tablet  3  . amLODipine (NORVASC) 10 MG tablet Take 10 mg by mouth daily.        Marland Kitchen atenolol (TENORMIN) 100 MG tablet Take 1 tablet (100 mg total) by mouth 2 (two) times daily.  180 tablet  3  . diclofenac (VOLTAREN) 75 MG EC tablet       . Epoetin Alfa (PROCRIT IJ) Inject 30,000 Units as directed every 6 (six) weeks. As directed every 6 weeks.      Marland Kitchen glipiZIDE (GLUCOTROL) 5 MG tablet Take 5 mg by mouth daily.      . hydrALAZINE (APRESOLINE) 25 MG tablet Take 1 tablet (25 mg total) by mouth 2 (two) times daily.  180 tablet  3  . Iron-Vitamins (GERITOL PO) Take by mouth every morning.       Marland Kitchen levothyroxine (SYNTHROID, LEVOTHROID) 25 MCG tablet Take 25 mcg by mouth as directed. Taking one tablet daily Monday-Friday  and two tablets daily Saturday and sunday       . omega-3 acid ethyl esters (LOVAZA) 1 G capsule Take 2 capsules (2 g total) by mouth 2 (two) times daily.  360 capsule  3  . potassium chloride (KLOR-CON) 10 MEQ CR tablet Take 1 tablet (10 mEq total) by mouth daily.  90 tablet  3  . pravastatin (PRAVACHOL) 20 MG tablet TAKE 1 TABLET DAILY  90 tablet  3  . telmisartan-hydrochlorothiazide (MICARDIS HCT) 80-12.5 MG per tablet Take 1 tablet by mouth daily.        . traMADol-acetaminophen (ULTRACET) 37.5-325 MG per tablet       . Multiple Vitamins-Iron (MULTIVITAMINS WITH IRON) TABS Take 1 tablet by mouth daily.         No current facility-administered medications for this visit.    No Known Allergies  Past Medical History  Diagnosis Date  . V-tach     remote VT, treated initially with procainamide, on amiodarone since 2008  . HTN (hypertension)     Last echo in 2004; EF normal  . Hyperlipidemia   . Diabetes mellitus   . Obesity   . Hypothyroidism     on replacement  . Anemia     treated with Procrit  . Gout   .  Abnormal PFTs (pulmonary function tests) 2008    No obstruction noted. Has been followed clinically.  . High risk medication use     on amiodarone.    Past Surgical History  Procedure Laterality Date  . Cardioversion  1978  . Cesarean section      x 3  . Abdominal hysterectomy    . US echocardiography  12/29/2002    EF 60-65%  . Cardiovascular stress test  07/07/2003    EF 70%. NO EVIDENCE OF ISCHEMIA    History  Smoking status  . Never Smoker   Smokeless tobacco  . Never Used    History  Alcohol Use No    Family History  Problem Relation Age of Onset  . Heart disease Mother   . Heart disease Father     Review of Systems: The review of systems is per the HPI.  All other systems were reviewed and are negative.  Physical Exam: BP 160/90  Pulse 48  Ht 5\' 1"  (1.549 m)  Wt 186 lb 6.4 oz (84.55 kg)  BMI 35.24 kg/m2 Patient is very pleasant and in no acute distress. Skin is warm and dry. Color is  normal.  HEENT is unremarkable. Normocephalic/atraumatic. PERRL. Sclera are nonicteric. Neck is supple. No masses. No JVD. Lungs are clear. Cardiac exam shows a regular rate and rhythm. Abdomen is soft. Extremities are without edema. Gait and ROM are intact. No gross neurologic deficits noted.  LABORATORY DATA:  Lab Results  Component Value Date   WBC 6.9 02/14/2012   HGB 10.4* 01/23/2013   HCT 28.2* 02/14/2012   PLT 132.0* 02/14/2012   GLUCOSE 183* 02/14/2012   CHOL 111 02/14/2012   TRIG 116.0 02/14/2012   HDL 23.70* 02/14/2012   LDLCALC 64 02/14/2012   ALT 28 08/14/2012   AST 41* 08/14/2012   NA 140 02/14/2012   K 4.4 02/14/2012   CL 108 02/14/2012   CREATININE 1.3* 02/14/2012   BUN 27* 02/14/2012   CO2 22 02/14/2012   TSH 3.58 08/14/2012   HGBA1C 6.2* 07/07/2011     Assessment / Plan: 1. History of VT - maintained on chronic low dose amiodarone therapy - no complaints of palpitations.   2. HTN - recheck of her BP by me is up to 170/90. I have increased her Hydralazine to 50 mg BID. She is seeing her PCP next month and will have her BP rechecked. We will tentatively see her back next September.   3. Anemia - on chronic Procrit  4. HLD - needs her labs updated. Not fasting today - seeing her PCP next month with fasting labs.    Patient is agreeable to this plan and will call if any problems develop in the interim.   Burtis Junes, RN, [place] [address] [place],   [id number]

## 2013-02-01 ENCOUNTER — Encounter (HOSPITAL_COMMUNITY): Payer: Self-pay | Admitting: *Deleted

## 2013-02-01 ENCOUNTER — Emergency Department (HOSPITAL_COMMUNITY): Payer: Medicare Other

## 2013-02-01 ENCOUNTER — Emergency Department (HOSPITAL_COMMUNITY)
Admission: EM | Admit: 2013-02-01 | Discharge: 2013-02-01 | Disposition: A | Discharge status: Home / Self Care | Payer: Medicare Other | Attending: Emergency Medicine | Admitting: Emergency Medicine

## 2013-02-01 DIAGNOSIS — M109 Gout, unspecified: Secondary | ICD-10-CM | POA: Insufficient documentation

## 2013-02-01 DIAGNOSIS — E039 Hypothyroidism, unspecified: Secondary | ICD-10-CM | POA: Diagnosis not present

## 2013-02-01 DIAGNOSIS — Z862 Personal history of diseases of the blood and blood-forming organs and certain disorders involving the immune mechanism: Secondary | ICD-10-CM | POA: Insufficient documentation

## 2013-02-01 DIAGNOSIS — E785 Hyperlipidemia, unspecified: Secondary | ICD-10-CM | POA: Insufficient documentation

## 2013-02-01 DIAGNOSIS — E119 Type 2 diabetes mellitus without complications: Secondary | ICD-10-CM | POA: Insufficient documentation

## 2013-02-01 DIAGNOSIS — S4980XA Other specified injuries of shoulder and upper arm, unspecified arm, initial encounter: Secondary | ICD-10-CM | POA: Diagnosis not present

## 2013-02-01 DIAGNOSIS — Y9389 Activity, other specified: Secondary | ICD-10-CM | POA: Insufficient documentation

## 2013-02-01 DIAGNOSIS — E669 Obesity, unspecified: Secondary | ICD-10-CM | POA: Insufficient documentation

## 2013-02-01 DIAGNOSIS — S42293A Other displaced fracture of upper end of unspecified humerus, initial encounter for closed fracture: Secondary | ICD-10-CM | POA: Diagnosis not present

## 2013-02-01 DIAGNOSIS — S42302A Unspecified fracture of shaft of humerus, left arm, initial encounter for closed fracture: Secondary | ICD-10-CM

## 2013-02-01 DIAGNOSIS — Z79899 Other long term (current) drug therapy: Secondary | ICD-10-CM | POA: Diagnosis not present

## 2013-02-01 DIAGNOSIS — R296 Repeated falls: Secondary | ICD-10-CM | POA: Insufficient documentation

## 2013-02-01 DIAGNOSIS — Y929 Unspecified place or not applicable: Secondary | ICD-10-CM | POA: Insufficient documentation

## 2013-02-01 DIAGNOSIS — I10 Essential (primary) hypertension: Secondary | ICD-10-CM | POA: Insufficient documentation

## 2013-02-01 DIAGNOSIS — M25519 Pain in unspecified shoulder: Secondary | ICD-10-CM | POA: Diagnosis not present

## 2013-02-01 DIAGNOSIS — S42209A Unspecified fracture of upper end of unspecified humerus, initial encounter for closed fracture: Secondary | ICD-10-CM | POA: Diagnosis not present

## 2013-02-01 MED ORDER — HYDROCODONE-ACETAMINOPHEN 5-325 MG PO TABS
1.0000 | ORAL_TABLET | Freq: Once | ORAL | Status: AC
  Administered 2013-02-01: 1 via ORAL
  Filled 2013-02-01: qty 1

## 2013-02-01 MED ORDER — HYDROCODONE-ACETAMINOPHEN 5-325 MG PO TABS: 2.0000 | ORAL_TABLET | ORAL | Status: DC | PRN

## 2013-02-01 NOTE — ED Notes (Signed)
Pt reports she lost her balance today and fell, landing on left shoulder. Left hand appears swollen as well. Fall was unwitnessed. Denies LOC

## 2013-02-01 NOTE — ED Provider Notes (Signed)
CSN: [id number]     Arrival date & time 02/01/13  1508 History   First MD Initiated Contact with Patient 02/01/13 1519     Chief Complaint  Patient presents with  . Shoulder Injury    HPI   Approximate one or 2 hours ago the patient was standing at [place] including the filter. She turned. Her foot hung up underneath a dryer. She lost her balance. She fell to her left, included the floor. It sounds like she landed on an abducted left shoulder, rather than extending her arm to break her fall. She has pain to the left shoulder only. She denies loss of consciousness she denies any prodromal symptoms no dizziness no feeling of palpitations is quite certain that this was not syncope but a fall due to her foot.  No headache no neck pain or injury no other areas of pain or concern of the left shoulder Past Medical History  Diagnosis Date  . V-tach     remote VT, treated initially with procainamide, on amiodarone since 2008  . HTN (hypertension)     Last echo in 2004; EF normal  . Hyperlipidemia   . Diabetes mellitus   . Obesity   . Hypothyroidism     on replacement  . Anemia     treated with Procrit  . Gout   . Abnormal PFTs (pulmonary function tests) 2008    No obstruction noted. Has been followed clinically.  . High risk medication use     on amiodarone.   Past Surgical History  Procedure Laterality Date  . Cardioversion  1978  . Cesarean section      x 3  . Abdominal hysterectomy    . US echocardiography  12/29/2002    EF 60-65%  . Cardiovascular stress test  07/07/2003    EF 70%. NO EVIDENCE OF ISCHEMIA   Family History  Problem Relation Age of Onset  . Heart disease Mother   . Heart disease Father    History  Substance Use Topics  . Smoking status: Never Smoker   . Smokeless tobacco: Never Used  . Alcohol Use: No   OB History   Grav Para Term Preterm Abortions TAB SAB Ect Mult Living                 Review of Systems  Constitutional: Negative for fatigue.  HENT:  Negative for neck pain.   Eyes: Negative for visual disturbance.  Respiratory: Negative for chest tightness and shortness of breath.   Cardiovascular: Negative for chest pain and palpitations.  Gastrointestinal: Negative for nausea and vomiting.  Musculoskeletal: Positive for arthralgias.  Skin: Negative for color change.  Neurological: Negative for dizziness and headaches.    Allergies  Review of patient's allergies indicates no known allergies.  Home Medications   Current Outpatient Rx  Name  Route  Sig  Dispense  Refill  . acetaminophen (TYLENOL) 500 MG tablet   Oral   Take 500 mg by mouth at bedtime.          Marland Kitchen allopurinol (ZYLOPRIM) 300 MG tablet   Oral   Take 300 mg by mouth every morning.         Marland Kitchen amiodarone (PACERONE) 100 MG tablet   Oral   Take 100 mg by mouth every morning.         Marland Kitchen amLODipine (NORVASC) 10 MG tablet   Oral   Take 10 mg by mouth at bedtime.         Marland Kitchen  atenolol (TENORMIN) 100 MG tablet   Oral   Take 1 tablet (100 mg total) by mouth 2 (two) times daily.   180 tablet   3   . Bevacizumab (AVASTIN) 100 MG/4ML SOLN   Intravenous   Inject into the vein every 8 (eight) weeks. Receives from [facility]         . Epoetin Alfa (PROCRIT IJ)   Injection   Inject 10,000 Units as directed every 28 (twenty-eight) days. As directed every 4 weeks.         Marland Kitchen glipiZIDE (GLUCOTROL) 5 MG tablet   Oral   Take 5 mg by mouth every morning.          . hydrALAZINE (APRESOLINE) 50 MG tablet   Oral   Take 1 tablet (50 mg total) by mouth 2 (two) times daily.   180 tablet   3   . levothyroxine (SYNTHROID, LEVOTHROID) 25 MCG tablet   Oral   Take 25-50 mcg by mouth as directed. Taking one tablet daily Monday-Friday  and two tablets daily Saturday and sunday         . Multiple Vitamin (MULTIVITAMIN WITH MINERALS) TABS tablet   Oral   Take 1 tablet by mouth every morning.         Marland Kitchen omega-3 acid ethyl esters (LOVAZA) 1 G  capsule   Oral   Take 2 capsules (2 g total) by mouth 2 (two) times daily.   360 capsule   3   . potassium chloride (K-DUR) 10 MEQ tablet   Oral   Take 10 mEq by mouth every morning.         . pravastatin (PRAVACHOL) 20 MG tablet   Oral   Take 20 mg by mouth at bedtime.         Marland Kitchen telmisartan-hydrochlorothiazide (MICARDIS HCT) 80-12.5 MG per tablet   Oral   Take 1 tablet by mouth every morning.         . traMADol-acetaminophen (ULTRACET) 37.5-325 MG per tablet   Oral   Take 1 tablet by mouth 2 (two) times daily.         Marland Kitchen HYDROcodone-acetaminophen (NORCO/VICODIN) 5-325 MG per tablet   Oral   Take 2 tablets by mouth every 4 (four) hours as needed for pain.   30 tablet   0    BP 150/62  Pulse 57  Temp(Src) 97.6 F (36.4 C) (Oral)  Resp 16  SpO2 94% Physical Exam  Constitutional: She is oriented to person, place, and time. She appears well-developed and well-nourished. No distress.  Elderly female. Awake alert. Oriented lucid.  HENT:  Head: Normocephalic.  Eyes: Conjunctivae are normal. Pupils are equal, round, and reactive to light. No scleral icterus.  Neck: Normal range of motion. Neck supple. No thyromegaly present.  Cardiovascular: Normal rate and regular rhythm.  Exam reveals no gallop and no friction rub.   No murmur heard. Pulmonary/Chest: Effort normal and breath sounds normal. No respiratory distress. She has no wheezes. She has no rales.  Abdominal: Soft. Bowel sounds are normal. She exhibits no distension. There is no tenderness. There is no rebound.  Musculoskeletal:  Tender to palpate in the anterior aspect of the humerus and mid area of the humeral head. Nontender over the clavicle. Nontender over the a.c. joint. Nontender posteriorly over the rotator cuff tendons. No firmness tenseness or ecchymosis to the shoulder. Full range of motion elbow. Full range of motion the wrist and hand. No pain to the hand.  Hands are symmetric and not swollen   Neurological: She is alert and oriented to person, place, and time.  Skin: Skin is warm and dry. No rash noted.  Psychiatric: She has a normal mood and affect.    ED Course  Procedures (including critical care time) Labs Review Labs Reviewed - No data to display Imaging Review Dg Shoulder Left  02/01/2013   CLINICAL DATA:  79 year old female status post fall with pain.  EXAM: LEFT SHOULDER - 2+ VIEW  COMPARISON:  None.  FINDINGS: No glenohumeral joint dislocation. However, there is a minimally displaced fracture of the left humeral head (arrow). The humerus neck and visible diaphysis appear intact. Left clavicle and scapula appear intact. Visible left ribs intact.  IMPRESSION: Minimally displaced left humeral head fracture. No glenohumeral dislocation.   Electronically Signed   By: Lars Pinks M.D.   On: 02/01/2013 16:31   Dg Humerus Left  02/01/2013   CLINICAL DATA:  Fall, shoulder pain  EXAM: LEFT HUMERUS - 2+ VIEW  COMPARISON:  None.  FINDINGS: There is no evidence of fracture or other focal bone lesions. Soft tissues are unremarkable. Study is suboptimal by diffuse osteopenia.  IMPRESSION: No acute fracture or subluxation. Diffuse osteopenia limits examination.   Electronically Signed   By: Lahoma Crocker   On: 02/01/2013 16:24    MDM   1. Humerus fracture, left, closed, initial encounter    X-ray show nondisplaced humeral head fracture. No evidence superior most aspect of the we will head. Discussed finding with her. She was to a sling. She'll wear this for comfort. Avoid heavy lifting but range of motion does be followup. Vicodin for pain. Diagnosis left humeral head fracture, nondisplaced.    Lolita Patella, MD 02/01/13 [phone]

## 2013-02-06 DIAGNOSIS — M25519 Pain in unspecified shoulder: Secondary | ICD-10-CM | POA: Diagnosis not present

## 2013-02-14 DIAGNOSIS — M25519 Pain in unspecified shoulder: Secondary | ICD-10-CM | POA: Diagnosis not present

## 2013-02-20 ENCOUNTER — Encounter (HOSPITAL_COMMUNITY): Payer: Medicare Other

## 2013-02-21 ENCOUNTER — Encounter (HOSPITAL_COMMUNITY)
Admission: RE | Admit: 2013-02-21 | Discharge: 2013-02-21 | Disposition: A | Discharge status: Home / Self Care | Payer: Medicare Other | Source: Ambulatory Visit | Attending: Internal Medicine | Admitting: Internal Medicine

## 2013-02-21 DIAGNOSIS — D638 Anemia in other chronic diseases classified elsewhere: Secondary | ICD-10-CM | POA: Insufficient documentation

## 2013-02-21 DIAGNOSIS — N289 Disorder of kidney and ureter, unspecified: Secondary | ICD-10-CM | POA: Insufficient documentation

## 2013-02-21 DIAGNOSIS — N2581 Secondary hyperparathyroidism of renal origin: Secondary | ICD-10-CM | POA: Diagnosis not present

## 2013-02-21 LAB — POCT HEMOGLOBIN-HEMACUE: Hemoglobin: 8.6 g/dL — ABNORMAL LOW (ref 12.0–15.0)

## 2013-02-21 MED ORDER — EPOETIN ALFA 10000 UNIT/ML IJ SOLN
30000.0000 [IU] | INTRAMUSCULAR | Status: DC
  Administered 2013-02-21: 20000 [IU] via SUBCUTANEOUS

## 2013-02-21 MED ORDER — EPOETIN ALFA 10000 UNIT/ML IJ SOLN
INTRAMUSCULAR | Status: AC
  Administered 2013-02-21: 10000 [IU]
  Filled 2013-02-21: qty 1

## 2013-02-21 MED ORDER — EPOETIN ALFA 20000 UNIT/ML IJ SOLN
INTRAMUSCULAR | Status: AC
  Filled 2013-02-21: qty 1

## 2013-02-25 MED FILL — Epoetin Alfa Inj 20000 Unit/ML: INTRAMUSCULAR | Qty: 1 | Status: AC

## 2013-02-27 DIAGNOSIS — H353290 Exudative age-related macular degeneration, unspecified eye, stage unspecified: Secondary | ICD-10-CM | POA: Diagnosis not present

## 2013-02-27 DIAGNOSIS — H35059 Retinal neovascularization, unspecified, unspecified eye: Secondary | ICD-10-CM | POA: Diagnosis not present

## 2013-02-27 DIAGNOSIS — H353190 Nonexudative age-related macular degeneration, unspecified eye, stage unspecified: Secondary | ICD-10-CM | POA: Diagnosis not present

## 2013-02-27 DIAGNOSIS — E11329 Type 2 diabetes mellitus with mild nonproliferative diabetic retinopathy without macular edema: Secondary | ICD-10-CM | POA: Diagnosis not present

## 2013-03-04 DIAGNOSIS — E782 Mixed hyperlipidemia: Secondary | ICD-10-CM | POA: Diagnosis not present

## 2013-03-04 DIAGNOSIS — M109 Gout, unspecified: Secondary | ICD-10-CM | POA: Diagnosis not present

## 2013-03-04 DIAGNOSIS — E119 Type 2 diabetes mellitus without complications: Secondary | ICD-10-CM | POA: Diagnosis not present

## 2013-03-04 DIAGNOSIS — I10 Essential (primary) hypertension: Secondary | ICD-10-CM | POA: Diagnosis not present

## 2013-03-07 DIAGNOSIS — M25519 Pain in unspecified shoulder: Secondary | ICD-10-CM | POA: Diagnosis not present

## 2013-03-10 ENCOUNTER — Other Ambulatory Visit: Payer: Self-pay | Admitting: Internal Medicine

## 2013-03-10 DIAGNOSIS — R748 Abnormal levels of other serum enzymes: Secondary | ICD-10-CM

## 2013-03-10 DIAGNOSIS — I10 Essential (primary) hypertension: Secondary | ICD-10-CM | POA: Diagnosis not present

## 2013-03-10 DIAGNOSIS — Z23 Encounter for immunization: Secondary | ICD-10-CM | POA: Diagnosis not present

## 2013-03-13 ENCOUNTER — Other Ambulatory Visit: Payer: Medicare Other

## 2013-03-20 ENCOUNTER — Other Ambulatory Visit (HOSPITAL_COMMUNITY): Payer: Self-pay | Admitting: *Deleted

## 2013-03-21 ENCOUNTER — Encounter (HOSPITAL_COMMUNITY)
Admission: RE | Admit: 2013-03-21 | Discharge: 2013-03-21 | Disposition: A | Discharge status: Home / Self Care | Payer: Medicare Other | Source: Ambulatory Visit | Attending: Internal Medicine | Admitting: Internal Medicine

## 2013-03-21 LAB — POCT HEMOGLOBIN-HEMACUE: Hemoglobin: 10.2 g/dL — ABNORMAL LOW (ref 12.0–15.0)

## 2013-03-21 MED ORDER — EPOETIN ALFA 10000 UNIT/ML IJ SOLN: 30000.0000 [IU] | INTRAMUSCULAR | Status: DC

## 2013-03-21 MED ORDER — EPOETIN ALFA 20000 UNIT/ML IJ SOLN
INTRAMUSCULAR | Status: AC
  Administered 2013-03-21: 09:00:00 20000 [IU]
  Filled 2013-03-21: qty 1

## 2013-03-21 MED ORDER — EPOETIN ALFA 10000 UNIT/ML IJ SOLN
INTRAMUSCULAR | Status: AC
  Administered 2013-03-21: 10000 [IU] via SUBCUTANEOUS
  Filled 2013-03-21: qty 1

## 2013-03-27 ENCOUNTER — Other Ambulatory Visit: Payer: Medicare Other

## 2013-04-21 ENCOUNTER — Encounter (HOSPITAL_COMMUNITY)
Admission: RE | Admit: 2013-04-21 | Discharge: 2013-04-21 | Disposition: A | Discharge status: Home / Self Care | Payer: Medicare Other | Source: Ambulatory Visit | Attending: Internal Medicine | Admitting: Internal Medicine

## 2013-04-21 DIAGNOSIS — N289 Disorder of kidney and ureter, unspecified: Secondary | ICD-10-CM | POA: Insufficient documentation

## 2013-04-21 DIAGNOSIS — D638 Anemia in other chronic diseases classified elsewhere: Secondary | ICD-10-CM | POA: Insufficient documentation

## 2013-04-21 DIAGNOSIS — N2581 Secondary hyperparathyroidism of renal origin: Secondary | ICD-10-CM | POA: Diagnosis not present

## 2013-04-21 LAB — POCT HEMOGLOBIN-HEMACUE: Hemoglobin: 9.8 g/dL — ABNORMAL LOW (ref 12.0–15.0)

## 2013-04-21 MED ORDER — EPOETIN ALFA 10000 UNIT/ML IJ SOLN
INTRAMUSCULAR | Status: AC
  Administered 2013-04-21: 10000 [IU] via SUBCUTANEOUS
  Filled 2013-04-21: qty 1

## 2013-04-21 MED ORDER — EPOETIN ALFA 10000 UNIT/ML IJ SOLN: 30000.0000 [IU] | INTRAMUSCULAR | Status: DC

## 2013-04-21 MED ORDER — EPOETIN ALFA 20000 UNIT/ML IJ SOLN
INTRAMUSCULAR | Status: AC
  Administered 2013-04-21: 09:00:00 20000 [IU] via SUBCUTANEOUS
  Filled 2013-04-21: qty 1

## 2013-04-24 DIAGNOSIS — H353290 Exudative age-related macular degeneration, unspecified eye, stage unspecified: Secondary | ICD-10-CM | POA: Diagnosis not present

## 2013-04-24 DIAGNOSIS — H35059 Retinal neovascularization, unspecified, unspecified eye: Secondary | ICD-10-CM | POA: Diagnosis not present

## 2013-05-19 ENCOUNTER — Encounter (HOSPITAL_COMMUNITY)
Admission: RE | Admit: 2013-05-19 | Discharge: 2013-05-19 | Disposition: A | Discharge status: Home / Self Care | Payer: Medicare Other | Source: Ambulatory Visit | Attending: Internal Medicine | Admitting: Internal Medicine

## 2013-05-19 LAB — POCT HEMOGLOBIN-HEMACUE: Hemoglobin: 9.7 g/dL — ABNORMAL LOW (ref 12.0–15.0)

## 2013-05-19 MED ORDER — EPOETIN ALFA 20000 UNIT/ML IJ SOLN
INTRAMUSCULAR | Status: AC
  Administered 2013-05-19: 08:00:00 20000 [IU] via SUBCUTANEOUS
  Filled 2013-05-19: qty 1

## 2013-05-19 MED ORDER — EPOETIN ALFA 10000 UNIT/ML IJ SOLN: 30000.0000 [IU] | INTRAMUSCULAR | Status: DC

## 2013-05-19 MED ORDER — EPOETIN ALFA 10000 UNIT/ML IJ SOLN
INTRAMUSCULAR | Status: AC
  Administered 2013-05-19: 08:00:00 10000 [IU] via SUBCUTANEOUS
  Filled 2013-05-19: qty 1

## 2013-06-04 ENCOUNTER — Other Ambulatory Visit: Payer: Self-pay | Admitting: Nurse Practitioner

## 2013-06-04 ENCOUNTER — Other Ambulatory Visit: Payer: Self-pay

## 2013-06-04 MED ORDER — AMIODARONE HCL 100 MG PO TABS: 100.0000 mg | ORAL_TABLET | Freq: Every morning | ORAL | Status: DC

## 2013-06-04 MED ORDER — LEVOTHYROXINE SODIUM 25 MCG PO TABS: 25.0000 ug | ORAL_TABLET | ORAL | Status: DC

## 2013-06-04 MED ORDER — ATENOLOL 100 MG PO TABS: 100.0000 mg | ORAL_TABLET | Freq: Two times a day (BID) | ORAL | Status: DC

## 2013-06-04 MED ORDER — OMEGA-3-ACID ETHYL ESTERS 1 G PO CAPS: 2.0000 g | ORAL_CAPSULE | Freq: Two times a day (BID) | ORAL | Status: DC

## 2013-06-04 MED ORDER — POTASSIUM CHLORIDE ER 10 MEQ PO TBCR: 10.0000 meq | EXTENDED_RELEASE_TABLET | Freq: Every morning | ORAL | Status: DC

## 2013-06-06 ENCOUNTER — Other Ambulatory Visit (HOSPITAL_COMMUNITY): Payer: Self-pay | Admitting: *Deleted

## 2013-06-09 ENCOUNTER — Encounter (HOSPITAL_COMMUNITY)
Admission: RE | Admit: 2013-06-09 | Discharge: 2013-06-09 | Disposition: A | Discharge status: Home / Self Care | Payer: Medicare Other | Source: Ambulatory Visit | Attending: Internal Medicine | Admitting: Internal Medicine

## 2013-06-09 DIAGNOSIS — N289 Disorder of kidney and ureter, unspecified: Secondary | ICD-10-CM | POA: Insufficient documentation

## 2013-06-09 DIAGNOSIS — D638 Anemia in other chronic diseases classified elsewhere: Secondary | ICD-10-CM | POA: Insufficient documentation

## 2013-06-09 DIAGNOSIS — N2581 Secondary hyperparathyroidism of renal origin: Secondary | ICD-10-CM | POA: Diagnosis not present

## 2013-06-09 LAB — POCT HEMOGLOBIN-HEMACUE: HEMOGLOBIN: 10.7 g/dL — AB (ref 12.0–15.0)

## 2013-06-09 MED ORDER — EPOETIN ALFA 10000 UNIT/ML IJ SOLN
INTRAMUSCULAR | Status: AC
  Filled 2013-06-09: qty 1

## 2013-06-09 MED ORDER — EPOETIN ALFA 20000 UNIT/ML IJ SOLN
INTRAMUSCULAR | Status: AC
  Administered 2013-06-09: 20000 [IU] via SUBCUTANEOUS
  Filled 2013-06-09: qty 1

## 2013-06-09 MED ORDER — EPOETIN ALFA 10000 UNIT/ML IJ SOLN
30000.0000 [IU] | INTRAMUSCULAR | Status: DC
  Administered 2013-06-09: 10:00:00 10000 [IU] via SUBCUTANEOUS

## 2013-06-16 DIAGNOSIS — Z1331 Encounter for screening for depression: Secondary | ICD-10-CM | POA: Diagnosis not present

## 2013-06-16 DIAGNOSIS — E119 Type 2 diabetes mellitus without complications: Secondary | ICD-10-CM | POA: Diagnosis not present

## 2013-06-16 DIAGNOSIS — Z0000 Encounter for general adult medical examination without abnormal findings: Secondary | ICD-10-CM | POA: Diagnosis not present

## 2013-06-16 DIAGNOSIS — M109 Gout, unspecified: Secondary | ICD-10-CM | POA: Diagnosis not present

## 2013-06-16 DIAGNOSIS — IMO0001 Reserved for inherently not codable concepts without codable children: Secondary | ICD-10-CM | POA: Diagnosis not present

## 2013-06-16 DIAGNOSIS — I10 Essential (primary) hypertension: Secondary | ICD-10-CM | POA: Diagnosis not present

## 2013-06-16 DIAGNOSIS — E782 Mixed hyperlipidemia: Secondary | ICD-10-CM | POA: Diagnosis not present

## 2013-06-19 DIAGNOSIS — H353290 Exudative age-related macular degeneration, unspecified eye, stage unspecified: Secondary | ICD-10-CM | POA: Diagnosis not present

## 2013-06-19 DIAGNOSIS — E1139 Type 2 diabetes mellitus with other diabetic ophthalmic complication: Secondary | ICD-10-CM | POA: Diagnosis not present

## 2013-06-19 DIAGNOSIS — H353190 Nonexudative age-related macular degeneration, unspecified eye, stage unspecified: Secondary | ICD-10-CM | POA: Diagnosis not present

## 2013-06-19 DIAGNOSIS — H35059 Retinal neovascularization, unspecified, unspecified eye: Secondary | ICD-10-CM | POA: Diagnosis not present

## 2013-06-23 DIAGNOSIS — N390 Urinary tract infection, site not specified: Secondary | ICD-10-CM | POA: Diagnosis not present

## 2013-06-23 DIAGNOSIS — I10 Essential (primary) hypertension: Secondary | ICD-10-CM | POA: Diagnosis not present

## 2013-06-23 DIAGNOSIS — E782 Mixed hyperlipidemia: Secondary | ICD-10-CM | POA: Diagnosis not present

## 2013-06-23 DIAGNOSIS — IMO0001 Reserved for inherently not codable concepts without codable children: Secondary | ICD-10-CM | POA: Diagnosis not present

## 2013-06-23 DIAGNOSIS — N183 Chronic kidney disease, stage 3 unspecified: Secondary | ICD-10-CM | POA: Diagnosis not present

## 2013-07-07 ENCOUNTER — Inpatient Hospital Stay (HOSPITAL_COMMUNITY): Admission: RE | Admit: 2013-07-07 | Payer: Medicare Other | Source: Ambulatory Visit

## 2013-07-21 ENCOUNTER — Encounter (HOSPITAL_COMMUNITY)
Admission: RE | Admit: 2013-07-21 | Discharge: 2013-07-21 | Disposition: A | Discharge status: Home / Self Care | Payer: Medicare Other | Source: Ambulatory Visit | Attending: Internal Medicine | Admitting: Internal Medicine

## 2013-07-21 DIAGNOSIS — N2581 Secondary hyperparathyroidism of renal origin: Secondary | ICD-10-CM | POA: Insufficient documentation

## 2013-07-21 DIAGNOSIS — D638 Anemia in other chronic diseases classified elsewhere: Secondary | ICD-10-CM | POA: Insufficient documentation

## 2013-07-21 DIAGNOSIS — N289 Disorder of kidney and ureter, unspecified: Secondary | ICD-10-CM | POA: Diagnosis not present

## 2013-07-21 LAB — POCT HEMOGLOBIN-HEMACUE: Hemoglobin: 10.1 g/dL — ABNORMAL LOW (ref 12.0–15.0)

## 2013-07-21 MED ORDER — EPOETIN ALFA 20000 UNIT/ML IJ SOLN
INTRAMUSCULAR | Status: AC
  Administered 2013-07-21: 20000 [IU] via SUBCUTANEOUS
  Filled 2013-07-21: qty 1

## 2013-07-21 MED ORDER — EPOETIN ALFA 10000 UNIT/ML IJ SOLN
INTRAMUSCULAR | Status: AC
Start: 2013-07-21 — End: 2013-07-21
  Administered 2013-07-21: 10000 [IU] via SUBCUTANEOUS
  Filled 2013-07-21: qty 1

## 2013-07-21 MED ORDER — EPOETIN ALFA 10000 UNIT/ML IJ SOLN: 30000.0000 [IU] | INTRAMUSCULAR | Status: DC

## 2013-08-13 ENCOUNTER — Ambulatory Visit (INDEPENDENT_AMBULATORY_CARE_PROVIDER_SITE_OTHER): Payer: Medicare Other | Admitting: Internal Medicine

## 2013-08-13 ENCOUNTER — Encounter: Payer: Self-pay | Admitting: Internal Medicine

## 2013-08-13 VITALS — BP 138/62 | HR 47 | Ht 61.0 in | Wt 182.0 lb

## 2013-08-13 DIAGNOSIS — I4891 Unspecified atrial fibrillation: Secondary | ICD-10-CM | POA: Diagnosis not present

## 2013-08-13 DIAGNOSIS — I472 Ventricular tachycardia, unspecified: Secondary | ICD-10-CM

## 2013-08-13 DIAGNOSIS — I4729 Other ventricular tachycardia: Secondary | ICD-10-CM | POA: Diagnosis not present

## 2013-08-13 DIAGNOSIS — I10 Essential (primary) hypertension: Secondary | ICD-10-CM | POA: Diagnosis not present

## 2013-08-13 DIAGNOSIS — E785 Hyperlipidemia, unspecified: Secondary | ICD-10-CM | POA: Diagnosis not present

## 2013-08-13 LAB — HEPATIC FUNCTION PANEL
ALT: 19 U/L (ref 0–35)
AST: 25 U/L (ref 0–37)
Albumin: 3.4 g/dL — ABNORMAL LOW (ref 3.5–5.2)
Alkaline Phosphatase: 101 U/L (ref 39–117)
BILIRUBIN DIRECT: 0.1 mg/dL (ref 0.0–0.3)
BILIRUBIN TOTAL: 0.3 mg/dL (ref 0.3–1.2)
Total Protein: 7.2 g/dL (ref 6.0–8.3)

## 2013-08-13 LAB — TSH: TSH: 8.46 u[IU]/mL — ABNORMAL HIGH (ref 0.35–5.50)

## 2013-08-13 NOTE — Patient Instructions (Signed)
Your physician wants you to follow-up in:  6 months with Truitt Merle, NP and 12 months with Dr. Rayann Heman. You will receive a reminder letter in the mail two months in advance. If you don't receive a letter, please call our office to schedule the follow-up appointment.

## 2013-08-13 NOTE — Progress Notes (Signed)
PCP: Serena Croissant, MD Primary Cardiologist:  Previously Dr Patrica Duel is a 80 y.o. female with a h/o remote ventricular tachycardia followed previously by Dr Doreatha Lew.  She had EP study (per his report) in the 1980s with no inducible VT.  She was treated initially with procainamide but has been on amiodarone since 2008.  She appears to be doing very well and remains active despite her age.  She denies any recent episodes of arrhythmia.  She is doing very well.  Today we looked at pictures of her great grandchildren. Today, she denies symptoms of palpitations, chest pain, shortness of breath, orthopnea, PND, lower extremity edema, presyncope, syncope, dizziness, or neurologic sequela. The patient is tolerating medications without difficulties and is otherwise without complaint today.   Past Medical History  Diagnosis Date  . V-tach     remote VT, treated initially with procainamide, on amiodarone since 2008  . HTN (hypertension)     Last echo in 2004; EF normal  . Hyperlipidemia   . Diabetes mellitus   . Obesity   . Hypothyroidism     on replacement  . Anemia     treated with Procrit  . Gout   . Abnormal PFTs (pulmonary function tests) 2008    No obstruction noted. Has been followed clinically.  . High risk medication use     on amiodarone.   Past Surgical History  Procedure Laterality Date  . Cardioversion  1978  . Cesarean section      x 3  . Abdominal hysterectomy    . US echocardiography  12/29/2002    EF 60-65%  . Cardiovascular stress test  07/07/2003    EF 70%. NO EVIDENCE OF ISCHEMIA    Current Outpatient Prescriptions  Medication Sig Dispense Refill  . acetaminophen (TYLENOL) 500 MG tablet Take 500 mg by mouth at bedtime.       Marland Kitchen allopurinol (ZYLOPRIM) 300 MG tablet Take 300 mg by mouth every morning.      Marland Kitchen amiodarone (PACERONE) 100 MG tablet Take 1 tablet (100 mg total) by mouth every morning.  90 tablet  3  . amLODipine (NORVASC) 10 MG tablet Take  10 mg by mouth at bedtime.      Marland Kitchen atenolol (TENORMIN) 100 MG tablet Take 1 tablet (100 mg total) by mouth 2 (two) times daily.  180 tablet  3  . Bevacizumab (AVASTIN) 100 MG/4ML SOLN Inject into the vein every 8 (eight) weeks. Receives from [facility]      . Epoetin Alfa (PROCRIT IJ) Inject 10,000 Units as directed every 28 (twenty-eight) days. As directed every 4 weeks.      Marland Kitchen glipiZIDE (GLUCOTROL) 5 MG tablet Take 5 mg by mouth every morning.       . hydrALAZINE (APRESOLINE) 50 MG tablet Take 1 tablet (50 mg total) by mouth 2 (two) times daily.  180 tablet  3  . levothyroxine (SYNTHROID, LEVOTHROID) 25 MCG tablet Take 1-2 tablets (25-50 mcg total) by mouth as directed. Taking one tablet daily Monday-Friday  and two tablets daily Saturday and sunday  116 tablet  6  . Multiple Vitamin (MULTIVITAMIN WITH MINERALS) TABS tablet Take 1 tablet by mouth every morning.      Marland Kitchen omega-3 acid ethyl esters (LOVAZA) 1 G capsule Take 2 capsules (2 g total) by mouth 2 (two) times daily.  360 capsule  3  . potassium chloride (K-DUR) 10 MEQ tablet Take 1 tablet (10 mEq total) by mouth every  morning.  90 tablet  3  . pravastatin (PRAVACHOL) 20 MG tablet Take 20 mg by mouth at bedtime.      Marland Kitchen telmisartan-hydrochlorothiazide (MICARDIS HCT) 80-12.5 MG per tablet Take 1 tablet by mouth every morning.       No current facility-administered medications for this visit.    No Known Allergies  History   Social History  . Marital Status: Married    Spouse Name: N/A    Number of Children: N/A  . Years of Education: N/A   Occupational History  . Not on file.   Social History Main Topics  . Smoking status: Never Smoker   . Smokeless tobacco: Never Used  . Alcohol Use: No  . Drug Use: No  . Sexual Activity: Not Currently   Other Topics Concern  . Not on file   Social History Narrative   Lives with spouse in [place]    Family History  Problem Relation Age of Onset  . Heart disease  Mother   . Heart disease Father     Physical Exam: Filed Vitals:   08/13/13 0855  BP: 138/62  Pulse: 47  Height: 5\' 1"  (1.549 m)  Weight: 182 lb (82.555 kg)    GEN- The patient is well appearing, alert and oriented x 3 today.   Head- normocephalic, atraumatic Eyes-  Sclera clear, conjunctiva pink Ears- hearing intact Oropharynx- clear Neck- supple  Lungs- Clear to ausculation bilaterally, normal work of breathing Heart- bradycardic regular rhythm, no murmurs, rubs or gallops, PMI not laterally displaced GI- soft, NT, ND, + BS Extremities- no clubbing, cyanosis, or edema Neuro- resting tremor seems better today  EKG today reveals sinus rhythm 60 bpm, first degree AV block, otherwise normal ekg  Assessment and Plan:  1. VT Controlled with amiodarone 100mg  daily lfts and TFTs today Given decrease in heart rate, I will also check an amiodarone level today to see if we can further reduce her dose.  2. HTN Stable No change required today  3. HL Not fasting today  Return to see Cecille Rubin in 6 months I will see again in 1 year

## 2013-08-14 DIAGNOSIS — H353190 Nonexudative age-related macular degeneration, unspecified eye, stage unspecified: Secondary | ICD-10-CM | POA: Diagnosis not present

## 2013-08-14 DIAGNOSIS — E1139 Type 2 diabetes mellitus with other diabetic ophthalmic complication: Secondary | ICD-10-CM | POA: Diagnosis not present

## 2013-08-14 DIAGNOSIS — H35059 Retinal neovascularization, unspecified, unspecified eye: Secondary | ICD-10-CM | POA: Diagnosis not present

## 2013-08-14 DIAGNOSIS — H353290 Exudative age-related macular degeneration, unspecified eye, stage unspecified: Secondary | ICD-10-CM | POA: Diagnosis not present

## 2013-08-14 DIAGNOSIS — E11329 Type 2 diabetes mellitus with mild nonproliferative diabetic retinopathy without macular edema: Secondary | ICD-10-CM | POA: Diagnosis not present

## 2013-08-16 LAB — AMIODARONE LEVEL
Amiodarone Lvl: 0.3 ug/mL — ABNORMAL LOW (ref 1.5–2.5)
Desethylamiodarone: 0.3 ug/mL — ABNORMAL LOW (ref 1.5–2.5)

## 2013-08-18 ENCOUNTER — Encounter (HOSPITAL_COMMUNITY)
Admission: RE | Admit: 2013-08-18 | Discharge: 2013-08-18 | Disposition: A | Discharge status: Home / Self Care | Payer: Medicare Other | Source: Ambulatory Visit | Attending: Internal Medicine | Admitting: Internal Medicine

## 2013-08-18 LAB — POCT HEMOGLOBIN-HEMACUE: HEMOGLOBIN: 10.5 g/dL — AB (ref 12.0–15.0)

## 2013-08-18 MED ORDER — EPOETIN ALFA 10000 UNIT/ML IJ SOLN
30000.0000 [IU] | INTRAMUSCULAR | Status: DC
  Administered 2013-08-18: 10000 [IU] via SUBCUTANEOUS

## 2013-08-18 MED ORDER — EPOETIN ALFA 20000 UNIT/ML IJ SOLN
INTRAMUSCULAR | Status: AC
Start: 2013-08-18 — End: 2013-08-18
  Administered 2013-08-18: 20000 [IU] via SUBCUTANEOUS
  Filled 2013-08-18: qty 1

## 2013-08-18 MED ORDER — EPOETIN ALFA 10000 UNIT/ML IJ SOLN
INTRAMUSCULAR | Status: AC
  Administered 2013-08-18: 10000 [IU] via SUBCUTANEOUS
  Filled 2013-08-18: qty 1

## 2013-08-18 NOTE — Progress Notes (Signed)
Pt stated that after she got her shot on 07/21/13 her whole upper arm turned red and blue and itched for one week.  I asked if she called and told the doctor and she did not.  I called and spoke with Dr Mathis Fare nurse and reported the above.  She spoke with Dr Ashby Dawes and he said it was ok to proceed today and to tell the pt if it happens again this week to call the dr's office so they can look at it.  The patient and daughter verbalized understanding.

## 2013-09-15 ENCOUNTER — Encounter (HOSPITAL_COMMUNITY)
Admission: RE | Admit: 2013-09-15 | Discharge: 2013-09-15 | Disposition: A | Discharge status: Home / Self Care | Payer: Medicare Other | Source: Ambulatory Visit | Attending: Internal Medicine | Admitting: Internal Medicine

## 2013-09-15 DIAGNOSIS — N289 Disorder of kidney and ureter, unspecified: Secondary | ICD-10-CM | POA: Diagnosis not present

## 2013-09-15 DIAGNOSIS — D638 Anemia in other chronic diseases classified elsewhere: Secondary | ICD-10-CM | POA: Insufficient documentation

## 2013-09-15 DIAGNOSIS — N2581 Secondary hyperparathyroidism of renal origin: Secondary | ICD-10-CM | POA: Insufficient documentation

## 2013-09-15 LAB — POCT HEMOGLOBIN-HEMACUE: HEMOGLOBIN: 10.9 g/dL — AB (ref 12.0–15.0)

## 2013-09-15 MED ORDER — EPOETIN ALFA 20000 UNIT/ML IJ SOLN
INTRAMUSCULAR | Status: AC
  Administered 2013-09-15: 20000 [IU]
  Filled 2013-09-15: qty 1

## 2013-09-15 MED ORDER — EPOETIN ALFA 10000 UNIT/ML IJ SOLN: 30000.0000 [IU] | INTRAMUSCULAR | Status: DC

## 2013-09-15 MED ORDER — EPOETIN ALFA 10000 UNIT/ML IJ SOLN
INTRAMUSCULAR | Status: AC
  Administered 2013-09-15: 10000 [IU]
  Filled 2013-09-15: qty 1

## 2013-10-02 DIAGNOSIS — H353290 Exudative age-related macular degeneration, unspecified eye, stage unspecified: Secondary | ICD-10-CM | POA: Diagnosis not present

## 2013-10-02 DIAGNOSIS — H353190 Nonexudative age-related macular degeneration, unspecified eye, stage unspecified: Secondary | ICD-10-CM | POA: Diagnosis not present

## 2013-10-14 ENCOUNTER — Encounter (HOSPITAL_COMMUNITY)
Admission: RE | Admit: 2013-10-14 | Discharge: 2013-10-14 | Disposition: A | Discharge status: Home / Self Care | Payer: Medicare Other | Source: Ambulatory Visit | Attending: Internal Medicine | Admitting: Internal Medicine

## 2013-10-14 DIAGNOSIS — D638 Anemia in other chronic diseases classified elsewhere: Secondary | ICD-10-CM | POA: Diagnosis not present

## 2013-10-14 DIAGNOSIS — N2581 Secondary hyperparathyroidism of renal origin: Secondary | ICD-10-CM | POA: Insufficient documentation

## 2013-10-14 DIAGNOSIS — N289 Disorder of kidney and ureter, unspecified: Secondary | ICD-10-CM | POA: Insufficient documentation

## 2013-10-14 LAB — POCT HEMOGLOBIN-HEMACUE: HEMOGLOBIN: 10.2 g/dL — AB (ref 12.0–15.0)

## 2013-10-14 MED ORDER — EPOETIN ALFA 10000 UNIT/ML IJ SOLN
INTRAMUSCULAR | Status: AC
  Administered 2013-10-14: 10000 [IU] via SUBCUTANEOUS
  Filled 2013-10-14: qty 1

## 2013-10-14 MED ORDER — EPOETIN ALFA 10000 UNIT/ML IJ SOLN: 30000.0000 [IU] | INTRAMUSCULAR | Status: DC

## 2013-10-14 MED ORDER — EPOETIN ALFA 20000 UNIT/ML IJ SOLN
INTRAMUSCULAR | Status: AC
  Administered 2013-10-14: 20000 [IU] via SUBCUTANEOUS
  Filled 2013-10-14: qty 1

## 2013-10-27 DIAGNOSIS — IMO0001 Reserved for inherently not codable concepts without codable children: Secondary | ICD-10-CM | POA: Diagnosis not present

## 2013-10-27 DIAGNOSIS — I10 Essential (primary) hypertension: Secondary | ICD-10-CM | POA: Diagnosis not present

## 2013-10-27 DIAGNOSIS — E782 Mixed hyperlipidemia: Secondary | ICD-10-CM | POA: Diagnosis not present

## 2013-11-03 ENCOUNTER — Other Ambulatory Visit: Payer: Self-pay | Admitting: Nurse Practitioner

## 2013-11-03 DIAGNOSIS — I10 Essential (primary) hypertension: Secondary | ICD-10-CM | POA: Diagnosis not present

## 2013-11-03 DIAGNOSIS — IMO0001 Reserved for inherently not codable concepts without codable children: Secondary | ICD-10-CM | POA: Diagnosis not present

## 2013-11-03 DIAGNOSIS — E039 Hypothyroidism, unspecified: Secondary | ICD-10-CM | POA: Diagnosis not present

## 2013-11-03 DIAGNOSIS — E782 Mixed hyperlipidemia: Secondary | ICD-10-CM | POA: Diagnosis not present

## 2013-11-05 ENCOUNTER — Other Ambulatory Visit: Payer: Self-pay

## 2013-11-05 MED ORDER — PRAVASTATIN SODIUM 20 MG PO TABS: 20.0000 mg | ORAL_TABLET | Freq: Every day | ORAL | Status: DC

## 2013-11-10 ENCOUNTER — Encounter (HOSPITAL_COMMUNITY)
Admission: RE | Admit: 2013-11-10 | Discharge: 2013-11-10 | Disposition: A | Discharge status: Home / Self Care | Payer: Medicare Other | Source: Ambulatory Visit | Attending: Internal Medicine | Admitting: Internal Medicine

## 2013-11-10 DIAGNOSIS — D638 Anemia in other chronic diseases classified elsewhere: Secondary | ICD-10-CM | POA: Insufficient documentation

## 2013-11-10 DIAGNOSIS — N289 Disorder of kidney and ureter, unspecified: Secondary | ICD-10-CM | POA: Diagnosis not present

## 2013-11-10 DIAGNOSIS — N2581 Secondary hyperparathyroidism of renal origin: Secondary | ICD-10-CM | POA: Insufficient documentation

## 2013-11-10 LAB — POCT HEMOGLOBIN-HEMACUE: Hemoglobin: 10.3 g/dL — ABNORMAL LOW (ref 12.0–15.0)

## 2013-11-10 MED ORDER — EPOETIN ALFA 20000 UNIT/ML IJ SOLN
INTRAMUSCULAR | Status: AC
  Administered 2013-11-10: 20000 [IU] via SUBCUTANEOUS
  Filled 2013-11-10: qty 1

## 2013-11-10 MED ORDER — EPOETIN ALFA 10000 UNIT/ML IJ SOLN
INTRAMUSCULAR | Status: AC
  Administered 2013-11-10: 10000 [IU] via SUBCUTANEOUS
  Filled 2013-11-10: qty 1

## 2013-11-10 MED ORDER — EPOETIN ALFA 10000 UNIT/ML IJ SOLN: 30000.0000 [IU] | INTRAMUSCULAR | Status: DC

## 2013-11-27 ENCOUNTER — Other Ambulatory Visit: Payer: Self-pay | Admitting: Nurse Practitioner

## 2013-12-08 ENCOUNTER — Encounter (HOSPITAL_COMMUNITY)
Admission: RE | Admit: 2013-12-08 | Discharge: 2013-12-08 | Disposition: A | Discharge status: Home / Self Care | Payer: Medicare Other | Source: Ambulatory Visit | Attending: Internal Medicine | Admitting: Internal Medicine

## 2013-12-08 DIAGNOSIS — D638 Anemia in other chronic diseases classified elsewhere: Secondary | ICD-10-CM | POA: Insufficient documentation

## 2013-12-08 DIAGNOSIS — N2581 Secondary hyperparathyroidism of renal origin: Secondary | ICD-10-CM | POA: Insufficient documentation

## 2013-12-08 DIAGNOSIS — N289 Disorder of kidney and ureter, unspecified: Secondary | ICD-10-CM | POA: Diagnosis not present

## 2013-12-08 LAB — POCT HEMOGLOBIN-HEMACUE: HEMOGLOBIN: 10.2 g/dL — AB (ref 12.0–15.0)

## 2013-12-08 MED ORDER — EPOETIN ALFA 10000 UNIT/ML IJ SOLN
INTRAMUSCULAR | Status: AC
  Administered 2013-12-08: 10000 [IU] via SUBCUTANEOUS
  Filled 2013-12-08: qty 1

## 2013-12-08 MED ORDER — EPOETIN ALFA 10000 UNIT/ML IJ SOLN: 30000.0000 [IU] | INTRAMUSCULAR | Status: DC

## 2013-12-08 MED ORDER — EPOETIN ALFA 20000 UNIT/ML IJ SOLN
INTRAMUSCULAR | Status: AC
  Administered 2013-12-08: 20000 [IU] via SUBCUTANEOUS
  Filled 2013-12-08: qty 1

## 2013-12-18 DIAGNOSIS — H35059 Retinal neovascularization, unspecified, unspecified eye: Secondary | ICD-10-CM | POA: Diagnosis not present

## 2013-12-18 DIAGNOSIS — H353290 Exudative age-related macular degeneration, unspecified eye, stage unspecified: Secondary | ICD-10-CM | POA: Diagnosis not present

## 2013-12-18 DIAGNOSIS — H353190 Nonexudative age-related macular degeneration, unspecified eye, stage unspecified: Secondary | ICD-10-CM | POA: Diagnosis not present

## 2014-01-05 ENCOUNTER — Encounter (HOSPITAL_COMMUNITY)
Admission: RE | Admit: 2014-01-05 | Discharge: 2014-01-05 | Disposition: A | Discharge status: Home / Self Care | Payer: Medicare Other | Source: Ambulatory Visit | Attending: Internal Medicine | Admitting: Internal Medicine

## 2014-01-05 DIAGNOSIS — N289 Disorder of kidney and ureter, unspecified: Secondary | ICD-10-CM | POA: Diagnosis not present

## 2014-01-05 DIAGNOSIS — N2581 Secondary hyperparathyroidism of renal origin: Secondary | ICD-10-CM | POA: Diagnosis not present

## 2014-01-05 DIAGNOSIS — D638 Anemia in other chronic diseases classified elsewhere: Secondary | ICD-10-CM | POA: Insufficient documentation

## 2014-01-05 MED ORDER — EPOETIN ALFA 20000 UNIT/ML IJ SOLN
INTRAMUSCULAR | Status: AC
  Administered 2014-01-05: 20000 [IU] via SUBCUTANEOUS
  Filled 2014-01-05: qty 1

## 2014-01-05 MED ORDER — EPOETIN ALFA 10000 UNIT/ML IJ SOLN: 30000.0000 [IU] | INTRAMUSCULAR | Status: DC

## 2014-01-05 MED ORDER — EPOETIN ALFA 10000 UNIT/ML IJ SOLN
INTRAMUSCULAR | Status: AC
  Administered 2014-01-05: 10000 [IU] via SUBCUTANEOUS
  Filled 2014-01-05: qty 1

## 2014-01-06 LAB — POCT HEMOGLOBIN-HEMACUE: Hemoglobin: 10 g/dL — ABNORMAL LOW (ref 12.0–15.0)

## 2014-02-02 ENCOUNTER — Encounter (HOSPITAL_COMMUNITY)
Admission: RE | Admit: 2014-02-02 | Discharge: 2014-02-02 | Disposition: A | Discharge status: Home / Self Care | Payer: Medicare Other | Source: Ambulatory Visit | Attending: Internal Medicine | Admitting: Internal Medicine

## 2014-02-02 DIAGNOSIS — N2581 Secondary hyperparathyroidism of renal origin: Secondary | ICD-10-CM | POA: Insufficient documentation

## 2014-02-02 DIAGNOSIS — D638 Anemia in other chronic diseases classified elsewhere: Secondary | ICD-10-CM | POA: Diagnosis not present

## 2014-02-02 DIAGNOSIS — N289 Disorder of kidney and ureter, unspecified: Secondary | ICD-10-CM | POA: Insufficient documentation

## 2014-02-02 LAB — POCT HEMOGLOBIN-HEMACUE: Hemoglobin: 9.5 g/dL — ABNORMAL LOW (ref 12.0–15.0)

## 2014-02-02 MED ORDER — EPOETIN ALFA 10000 UNIT/ML IJ SOLN: 30000.0000 [IU] | INTRAMUSCULAR | Status: DC

## 2014-02-02 MED ORDER — EPOETIN ALFA 20000 UNIT/ML IJ SOLN
INTRAMUSCULAR | Status: AC
  Administered 2014-02-02: 20000 [IU] via SUBCUTANEOUS
  Filled 2014-02-02: qty 1

## 2014-02-02 MED ORDER — EPOETIN ALFA 10000 UNIT/ML IJ SOLN
INTRAMUSCULAR | Status: AC
  Administered 2014-02-02: 10000 [IU] via SUBCUTANEOUS
  Filled 2014-02-02: qty 1

## 2014-02-09 ENCOUNTER — Encounter: Payer: Self-pay | Admitting: Nurse Practitioner

## 2014-02-09 ENCOUNTER — Ambulatory Visit (INDEPENDENT_AMBULATORY_CARE_PROVIDER_SITE_OTHER): Payer: Medicare Other | Admitting: Nurse Practitioner

## 2014-02-09 VITALS — BP 138/58 | HR 52 | Ht 62.0 in | Wt 192.0 lb

## 2014-02-09 DIAGNOSIS — R0600 Dyspnea, unspecified: Secondary | ICD-10-CM

## 2014-02-09 DIAGNOSIS — I4729 Other ventricular tachycardia: Secondary | ICD-10-CM | POA: Diagnosis not present

## 2014-02-09 DIAGNOSIS — I472 Ventricular tachycardia, unspecified: Secondary | ICD-10-CM

## 2014-02-09 DIAGNOSIS — R0989 Other specified symptoms and signs involving the circulatory and respiratory systems: Secondary | ICD-10-CM

## 2014-02-09 DIAGNOSIS — I10 Essential (primary) hypertension: Secondary | ICD-10-CM | POA: Diagnosis not present

## 2014-02-09 DIAGNOSIS — I4891 Unspecified atrial fibrillation: Secondary | ICD-10-CM | POA: Diagnosis not present

## 2014-02-09 DIAGNOSIS — R0609 Other forms of dyspnea: Secondary | ICD-10-CM

## 2014-02-09 DIAGNOSIS — I519 Heart disease, unspecified: Secondary | ICD-10-CM

## 2014-02-09 DIAGNOSIS — Z79899 Other long term (current) drug therapy: Secondary | ICD-10-CM

## 2014-02-09 DIAGNOSIS — I480 Paroxysmal atrial fibrillation: Secondary | ICD-10-CM

## 2014-02-09 LAB — LIPID PANEL
Cholesterol: 107 mg/dL (ref 0–200)
HDL: 20.1 mg/dL — ABNORMAL LOW (ref >39.00)
LDL Cholesterol: 63 mg/dL (ref 0–99)
NonHDL: 86.9
Total CHOL/HDL Ratio: 5
Triglycerides: 120 mg/dL (ref 0.0–149.0)
VLDL: 24 mg/dL (ref 0.0–40.0)

## 2014-02-09 LAB — BASIC METABOLIC PANEL
BUN: 36 mg/dL — ABNORMAL HIGH (ref 6–23)
CO2: 20 mEq/L (ref 19–32)
Calcium: 9.4 mg/dL (ref 8.4–10.5)
Chloride: 113 mEq/L — ABNORMAL HIGH (ref 96–112)
Creatinine, Ser: 2.1 mg/dL — ABNORMAL HIGH (ref 0.4–1.2)
GFR: 23.73 mL/min — ABNORMAL LOW (ref >60.00)
Glucose, Bld: 211 mg/dL — ABNORMAL HIGH (ref 70–99)
Potassium: 4.6 mEq/L (ref 3.5–5.1)
Sodium: 140 mEq/L (ref 135–145)

## 2014-02-09 LAB — HEPATIC FUNCTION PANEL
ALT: 16 U/L (ref 0–35)
AST: 25 U/L (ref 0–37)
Albumin: 3.3 g/dL — ABNORMAL LOW (ref 3.5–5.2)
Alkaline Phosphatase: 99 U/L (ref 39–117)
Bilirubin, Direct: 0 mg/dL (ref 0.0–0.3)
Total Bilirubin: 0.5 mg/dL (ref 0.2–1.2)
Total Protein: 7.5 g/dL (ref 6.0–8.3)

## 2014-02-09 LAB — CBC
HCT: 27.9 % — ABNORMAL LOW (ref 36.0–46.0)
Hemoglobin: 9 g/dL — ABNORMAL LOW (ref 12.0–15.0)
MCHC: 32.4 g/dL (ref 30.0–36.0)
MCV: 90.3 fl (ref 78.0–100.0)
Platelets: 138 10*3/uL — ABNORMAL LOW (ref 150.0–400.0)
RBC: 3.09 Mil/uL — ABNORMAL LOW (ref 3.87–5.11)
RDW: 19.6 % — ABNORMAL HIGH (ref 11.5–15.5)
WBC: 7.3 10*3/uL (ref 4.0–10.5)

## 2014-02-09 LAB — BRAIN NATRIURETIC PEPTIDE: Pro B Natriuretic peptide (BNP): 554 pg/mL — ABNORMAL HIGH (ref 0.0–100.0)

## 2014-02-09 MED ORDER — FUROSEMIDE 40 MG PO TABS: 40.0000 mg | ORAL_TABLET | Freq: Every day | ORAL | Status: DC

## 2014-02-09 MED ORDER — ATENOLOL 100 MG PO TABS: 50.0000 mg | ORAL_TABLET | Freq: Two times a day (BID) | ORAL | Status: DC

## 2014-02-09 NOTE — Addendum Note (Signed)
Addended by: Burtis Junes on: 02/09/2014 09:02 AM   Modules accepted: Orders

## 2014-02-09 NOTE — Progress Notes (Addendum)
Leslie Coleman Date of Birth: February 07, 1929 Medical Record [id number]  History of Present Illness: Leslie Coleman is seen back today for her 6 month check. Seen for Dr. Rayann Coleman - former patient of Dr. Susa Coleman. Has a remote history of VT - treated initially with procainamide but switched to amiodarone back in 2008 due to unavailability of the medicine. Does have chronically abnormal PFTs. Other issues include HTN, gout, obesity, DM and anemia. On chronic Procrit.   Last seen here in March of 2015 by Dr. Rayann Coleman. I last saw her a year ago.   Comes in today. Here alone. Looks to be getting feeble. Has gotten a cold. Says her breathing is about the same - chronic DOE. No chest pain. More swelling. Weight is up since she was last here. Rhythm has been ok. She spent most of the visit talking about her grand kids and great grand babies. Tolerating her medicine. No syncope.   Current Outpatient Prescriptions  Medication Sig Dispense Refill  . acetaminophen (TYLENOL) 500 MG tablet Take 500 mg by mouth at bedtime.       Marland Kitchen allopurinol (ZYLOPRIM) 300 MG tablet TAKE 1 TABLET EVERY DAY  90 tablet  1  . amiodarone (PACERONE) 100 MG tablet Take 1 tablet (100 mg total) by mouth every morning.  90 tablet  3  . amLODipine (NORVASC) 10 MG tablet Take 10 mg by mouth at bedtime.      Marland Kitchen atenolol (TENORMIN) 100 MG tablet Take 1 tablet (100 mg total) by mouth 2 (two) times daily.  180 tablet  3  . Bevacizumab (AVASTIN) 100 MG/4ML SOLN Inject into the vein every 8 (eight) weeks. Receives from [facility]      . Epoetin Alfa (PROCRIT IJ) Inject 10,000 Units as directed every 28 (twenty-eight) days. As directed every 4 weeks.      Marland Kitchen glipiZIDE (GLUCOTROL) 5 MG tablet Take 5 mg by mouth every morning.       . hydrALAZINE (APRESOLINE) 50 MG tablet TAKE 1 TABLET BY MOUTH TWICE A DAY  180 tablet  1  . levothyroxine (SYNTHROID, LEVOTHROID) 25 MCG tablet Take 1-2 tablets (25-50 mcg total) by mouth as directed. Taking one  tablet daily Monday-Friday  and two tablets daily Saturday and sunday  116 tablet  6  . Multiple Vitamin (MULTIVITAMIN WITH MINERALS) TABS tablet Take 1 tablet by mouth every morning.      Marland Kitchen omega-3 acid ethyl esters (LOVAZA) 1 G capsule Take 2 capsules (2 g total) by mouth 2 (two) times daily.  360 capsule  3  . potassium chloride (K-DUR) 10 MEQ tablet Take 1 tablet (10 mEq total) by mouth every morning.  90 tablet  3  . pravastatin (PRAVACHOL) 20 MG tablet Take 1 tablet (20 mg total) by mouth at bedtime.  30 tablet  6  . telmisartan-hydrochlorothiazide (MICARDIS HCT) 80-12.5 MG per tablet Take 1 tablet by mouth every morning.       No current facility-administered medications for this visit.    No Known Allergies  Past Medical History  Diagnosis Date  . V-tach     remote VT, treated initially with procainamide, on amiodarone since 2008  . HTN (hypertension)     Last echo in 2004; EF normal  . Hyperlipidemia   . Diabetes mellitus   . Obesity   . Hypothyroidism     on replacement  . Anemia     treated with Procrit  . Gout   . Abnormal PFTs (pulmonary  function tests) 2008    No obstruction noted. Has been followed clinically.  . High risk medication use     on amiodarone.    Past Surgical History  Procedure Laterality Date  . Cardioversion  1978  . Cesarean section      x 3  . Abdominal hysterectomy    . US echocardiography  12/29/2002    EF 60-65%  . Cardiovascular stress test  07/07/2003    EF 70%. NO EVIDENCE OF ISCHEMIA    History  Smoking status  . Never Smoker   Smokeless tobacco  . Never Used    History  Alcohol Use No    Family History  Problem Relation Age of Onset  . Heart disease Mother   . Heart disease Father     Review of Systems: The review of systems is per the HPI.  All other systems were reviewed and are negative.  Physical Exam: BP 138/58  Pulse 52  Ht 5\' 2"  (1.575 m)  Wt 192 lb (87.091 kg)  BMI 35.11 kg/m2  SpO2 92% Patient is  very pleasant and in no acute distress. Her weight is up 10 pounds. Color is pale and sallow. Skin is warm and dry.  HEENT is unremarkable. Normocephalic/atraumatic. PERRL. Sclera are nonicteric. Neck is supple. No masses. No JVD. Lungs are clear. Cardiac exam shows a regular rate and rhythm. Heart tones distant. Abdomen is soft. Extremities are with 1+ edema. Gait and ROM are intact. No gross neurologic deficits noted.  Wt Readings from Last 3 Encounters:  02/09/14 192 lb (87.091 kg)  08/13/13 182 lb (82.555 kg)  01/24/13 186 lb 6.4 oz (84.55 kg)    LABORATORY DATA/PROCEDURES:  Lab Results  Component Value Date   WBC 6.9 02/14/2012   HGB 9.5* 02/02/2014   HCT 28.2* 02/14/2012   PLT 132.0* 02/14/2012   GLUCOSE 183* 02/14/2012   CHOL 111 02/14/2012   TRIG 116.0 02/14/2012   HDL 23.70* 02/14/2012   LDLCALC 64 02/14/2012   ALT 19 08/13/2013   AST 25 08/13/2013   NA 140 02/14/2012   K 4.4 02/14/2012   CL 108 02/14/2012   CREATININE 1.3* 02/14/2012   BUN 27* 02/14/2012   CO2 22 02/14/2012   TSH 8.46* 08/13/2013   HGBA1C 6.2* 07/07/2011    BNP (last 3 results) No results found for this basename: PROBNP,  in the last 8760 hours  Echo Study Conclusions from February 2013  - Left ventricle: Systolic function was mildly reduced. The estimated ejection fraction was in the range of 45% to 50%. - Pulmonary arteries: PA peak pressure: 77mm Hg (S).   Assessment / Plan: 1. History of VT - maintained on chronic low dose amiodarone therapy - no complaints of palpitations. Check EKG today.  2. HTN - BP ok on current regimen.   3. Anemia - on chronic Procrit   4. HLD - on statin therapy  5. Mild LV dysfunction -  Has more swelling on exam, weight is up - chronic DOE - will update her echo - check follow up labs today as well.   Tentative see her back by Dr. Rayann Coleman as planned unless her echo is abnormal.   Patient is agreeable to this plan and will call if any problems develop in the interim.    Leslie Junes, RN, [facility] [address] [place], [place]  [address] [phone]  Addendum:  EKG today shows marked sinus brady - 1st  degree AV block - will cut her atenolol back - seen back in a month with repeat EKG. Echo and labs today as ordered.

## 2014-02-09 NOTE — Patient Instructions (Addendum)
Stay on your current medicines but I have added Lasix 40 mg to take daily for the next 3 days and then just use if needed (more swelling) - this is at the drug store  I am also cutting your atenolol back to just 1/2 pill twice a day  I want to get an ultrasound of your heart  We will check labs today  We will check an EKG today  See Dr. Rayann Heman back in March - but if your echo is abnormal - I will be getting you back here sooner.  Call the [place] office at [phone] if you have any questions, problems or concerns.

## 2014-02-10 LAB — TSH: TSH: 11.64 u[IU]/mL — ABNORMAL HIGH (ref 0.35–4.50)

## 2014-02-11 ENCOUNTER — Other Ambulatory Visit: Payer: Self-pay

## 2014-02-11 DIAGNOSIS — I10 Essential (primary) hypertension: Secondary | ICD-10-CM

## 2014-02-11 DIAGNOSIS — E039 Hypothyroidism, unspecified: Secondary | ICD-10-CM

## 2014-02-11 DIAGNOSIS — D649 Anemia, unspecified: Secondary | ICD-10-CM

## 2014-02-11 MED ORDER — LEVOTHYROXINE SODIUM 50 MCG PO TABS: 50.0000 ug | ORAL_TABLET | Freq: Every day | ORAL | Status: DC

## 2014-02-16 ENCOUNTER — Ambulatory Visit (HOSPITAL_COMMUNITY): Payer: Medicare Other | Attending: Internal Medicine | Admitting: Cardiology

## 2014-02-16 DIAGNOSIS — E785 Hyperlipidemia, unspecified: Secondary | ICD-10-CM | POA: Diagnosis not present

## 2014-02-16 DIAGNOSIS — E119 Type 2 diabetes mellitus without complications: Secondary | ICD-10-CM | POA: Insufficient documentation

## 2014-02-16 DIAGNOSIS — I4891 Unspecified atrial fibrillation: Secondary | ICD-10-CM

## 2014-02-16 DIAGNOSIS — Z79899 Other long term (current) drug therapy: Secondary | ICD-10-CM

## 2014-02-16 DIAGNOSIS — I4729 Other ventricular tachycardia: Secondary | ICD-10-CM

## 2014-02-16 DIAGNOSIS — R0600 Dyspnea, unspecified: Secondary | ICD-10-CM

## 2014-02-16 DIAGNOSIS — I472 Ventricular tachycardia, unspecified: Secondary | ICD-10-CM

## 2014-02-16 DIAGNOSIS — R0602 Shortness of breath: Secondary | ICD-10-CM | POA: Insufficient documentation

## 2014-02-16 DIAGNOSIS — I480 Paroxysmal atrial fibrillation: Secondary | ICD-10-CM

## 2014-02-16 DIAGNOSIS — I10 Essential (primary) hypertension: Secondary | ICD-10-CM | POA: Diagnosis not present

## 2014-02-16 NOTE — Progress Notes (Signed)
Echo performed. 

## 2014-02-19 DIAGNOSIS — H35051 Retinal neovascularization, unspecified, right eye: Secondary | ICD-10-CM | POA: Diagnosis not present

## 2014-02-19 DIAGNOSIS — H3532 Exudative age-related macular degeneration: Secondary | ICD-10-CM | POA: Diagnosis not present

## 2014-03-02 ENCOUNTER — Encounter (HOSPITAL_COMMUNITY): Payer: Medicare Other

## 2014-03-03 ENCOUNTER — Encounter (HOSPITAL_COMMUNITY)
Admission: RE | Admit: 2014-03-03 | Discharge: 2014-03-03 | Disposition: A | Discharge status: Home / Self Care | Payer: Medicare Other | Source: Ambulatory Visit | Attending: Internal Medicine | Admitting: Internal Medicine

## 2014-03-03 DIAGNOSIS — D649 Anemia, unspecified: Secondary | ICD-10-CM | POA: Insufficient documentation

## 2014-03-03 LAB — POCT HEMOGLOBIN-HEMACUE: Hemoglobin: 9.4 g/dL — ABNORMAL LOW (ref 12.0–15.0)

## 2014-03-03 MED ORDER — EPOETIN ALFA 10000 UNIT/ML IJ SOLN: 30000.0000 [IU] | INTRAMUSCULAR | Status: DC

## 2014-03-03 MED ORDER — EPOETIN ALFA 10000 UNIT/ML IJ SOLN
INTRAMUSCULAR | Status: AC
  Administered 2014-03-03: 10000 [IU] via SUBCUTANEOUS
  Filled 2014-03-03: qty 1

## 2014-03-03 MED ORDER — EPOETIN ALFA 20000 UNIT/ML IJ SOLN
INTRAMUSCULAR | Status: AC
  Administered 2014-03-03: 20000 [IU] via SUBCUTANEOUS
  Filled 2014-03-03: qty 1

## 2014-03-06 DIAGNOSIS — Z23 Encounter for immunization: Secondary | ICD-10-CM | POA: Diagnosis not present

## 2014-03-09 ENCOUNTER — Other Ambulatory Visit: Payer: Medicare Other | Admitting: *Deleted

## 2014-03-09 ENCOUNTER — Ambulatory Visit (INDEPENDENT_AMBULATORY_CARE_PROVIDER_SITE_OTHER): Payer: Medicare Other | Admitting: Nurse Practitioner

## 2014-03-09 ENCOUNTER — Encounter: Payer: Self-pay | Admitting: Nurse Practitioner

## 2014-03-09 VITALS — BP 130/66 | HR 58 | Ht 62.0 in | Wt 184.8 lb

## 2014-03-09 DIAGNOSIS — I10 Essential (primary) hypertension: Secondary | ICD-10-CM

## 2014-03-09 DIAGNOSIS — R0600 Dyspnea, unspecified: Secondary | ICD-10-CM | POA: Diagnosis not present

## 2014-03-09 DIAGNOSIS — E785 Hyperlipidemia, unspecified: Secondary | ICD-10-CM

## 2014-03-09 DIAGNOSIS — E039 Hypothyroidism, unspecified: Secondary | ICD-10-CM

## 2014-03-09 DIAGNOSIS — Z79899 Other long term (current) drug therapy: Secondary | ICD-10-CM

## 2014-03-09 LAB — BASIC METABOLIC PANEL
BUN: 32 mg/dL — ABNORMAL HIGH (ref 6–23)
CO2: 18 mEq/L — ABNORMAL LOW (ref 19–32)
Calcium: 9.2 mg/dL (ref 8.4–10.5)
Chloride: 111 mEq/L (ref 96–112)
Creatinine, Ser: 1.9 mg/dL — ABNORMAL HIGH (ref 0.4–1.2)
GFR: 27.45 mL/min — ABNORMAL LOW (ref >60.00)
Glucose, Bld: 168 mg/dL — ABNORMAL HIGH (ref 70–99)
Potassium: 4.5 mEq/L (ref 3.5–5.1)
Sodium: 140 mEq/L (ref 135–145)

## 2014-03-09 LAB — TSH: TSH: 6.76 u[IU]/mL — ABNORMAL HIGH (ref 0.35–4.50)

## 2014-03-09 NOTE — Patient Instructions (Addendum)
We will be checking the following labs today BMET & TSH  Stay on your current medicines - you can take the Lasix "just as needed" - for increased swelling, etc.   See Dr. Rayann Heman in March  I will see you next September  Call the [place] office at [phone] if you have any questions, problems or concerns.

## 2014-03-09 NOTE — Progress Notes (Signed)
Leslie Coleman Date of Birth: 10/26/1928 Medical Record [id number]  History of Present Illness: Leslie Coleman is seen back today for a one month check. Seen for Dr. Rayann Coleman - former patient of Dr. Susa Coleman. Has a remote history of VT - treated initially with procainamide but switched to amiodarone back in 2008 due to unavailability of the medicine. Does have chronically abnormal PFTs. Other issues include HTN, gout, obesity, DM and anemia. On chronic Procrit.   Last seen here in March of 2015 by Dr. Rayann Coleman.   I saw her last month - seemed to be getting feeble. Had a cold. Chronic DOE noted. More swelling. Weight was up. I added Lasix for a few day. Cut her atenolol back due to bradycardia and updated her labs and echo. TSH was elevated and we increased her thyroid medicine.   Comes in today. Here alone. Showing [place] her pictures again of her grandkids. Says she is doing ok. Not really short of breath. The lasix made her a little dizzy and she is only using prn. No chest pain. Weight is back down. Continues to get her Procrit. Seeing her PCP next month.    Current Outpatient Prescriptions  Medication Sig Dispense Refill  . acetaminophen (TYLENOL) 500 MG tablet Take 500 mg by mouth at bedtime.       Marland Kitchen allopurinol (ZYLOPRIM) 300 MG tablet TAKE 1 TABLET EVERY DAY  90 tablet  1  . amiodarone (PACERONE) 100 MG tablet Take 1 tablet (100 mg total) by mouth every morning.  90 tablet  3  . amLODipine (NORVASC) 10 MG tablet Take 10 mg by mouth at bedtime.      Marland Kitchen atenolol (TENORMIN) 100 MG tablet Take 0.5 tablets (50 mg total) by mouth 2 (two) times daily.  180 tablet  3  . Bevacizumab (AVASTIN) 100 MG/4ML SOLN Inject into the vein every 8 (eight) weeks. Receives from [facility]      . Epoetin Alfa (PROCRIT IJ) Inject 10,000 Units as directed every 28 (twenty-eight) days. As directed every 4 weeks.      . furosemide (LASIX) 40 MG tablet Take 1 tablet (40 mg total) by mouth daily.  30 tablet  3  .  glipiZIDE (GLUCOTROL) 5 MG tablet Take 5 mg by mouth every morning.       . hydrALAZINE (APRESOLINE) 50 MG tablet TAKE 1 TABLET BY MOUTH TWICE A DAY  180 tablet  1  . levothyroxine (SYNTHROID, LEVOTHROID) 50 MCG tablet Take 1 tablet (50 mcg total) by mouth daily before breakfast.  90 tablet  1  . Multiple Vitamin (MULTIVITAMIN WITH MINERALS) TABS tablet Take 1 tablet by mouth every morning.      Marland Kitchen omega-3 acid ethyl esters (LOVAZA) 1 G capsule Take 2 capsules (2 g total) by mouth 2 (two) times daily.  360 capsule  3  . potassium chloride (K-DUR) 10 MEQ tablet Take 1 tablet (10 mEq total) by mouth every morning.  90 tablet  3  . pravastatin (PRAVACHOL) 20 MG tablet Take 1 tablet (20 mg total) by mouth at bedtime.  30 tablet  6  . telmisartan-hydrochlorothiazide (MICARDIS HCT) 80-12.5 MG per tablet Take 1 tablet by mouth every morning.       No current facility-administered medications for this visit.    No Known Allergies  Past Medical History  Diagnosis Date  . V-tach     remote VT, treated initially with procainamide, on amiodarone since 2008  . HTN (hypertension)  Last echo in 2004; EF normal  . Hyperlipidemia   . Diabetes mellitus   . Obesity   . Hypothyroidism     on replacement  . Anemia     treated with Procrit  . Gout   . Abnormal PFTs (pulmonary function tests) 2008    No obstruction noted. Has been followed clinically.  . High risk medication use     on amiodarone.    Past Surgical History  Procedure Laterality Date  . Cardioversion  1978  . Cesarean section      x 3  . Abdominal hysterectomy    . US echocardiography  12/29/2002    EF 60-65%  . Cardiovascular stress test  07/07/2003    EF 70%. NO EVIDENCE OF ISCHEMIA    History  Smoking status  . Never Smoker   Smokeless tobacco  . Never Used    History  Alcohol Use No    Family History  Problem Relation Age of Onset  . Heart disease Mother   . Heart disease Father     Review of Systems: The  review of systems is per the HPI.  All other systems were reviewed and are negative.  Physical Exam: BP 130/66  Pulse 58  Ht 5\' 2"  (1.575 m)  Wt 184 lb 12.8 oz (83.825 kg)  BMI 33.79 kg/m2  SpO2 95% Patient is very pleasant and in no acute distress. Weight is back down. Skin is warm and dry. Color is normal.  Chronically looks sallow. HEENT is unremarkable. Normocephalic/atraumatic. PERRL. Sclera are nonicteric. Neck is supple. No masses. No JVD. Lungs are clear. Cardiac exam shows a regular rate and rhythm. Abdomen is soft. Extremities are without edema. Gait and ROM are intact. No gross neurologic deficits noted.  Wt Readings from Last 3 Encounters:  03/09/14 184 lb 12.8 oz (83.825 kg)  02/09/14 192 lb (87.091 kg)  08/13/13 182 lb (82.555 kg)    LABORATORY DATA/PROCEDURES:  Lab Results  Component Value Date   WBC 7.3 02/09/2014   HGB 9.4* 03/03/2014   HCT 27.9* 02/09/2014   PLT 138.0* 02/09/2014   GLUCOSE 211* 02/09/2014   CHOL 107 02/09/2014   TRIG 120.0 02/09/2014   HDL 20.10* 02/09/2014   LDLCALC 63 02/09/2014   ALT 16 02/09/2014   AST 25 02/09/2014   NA 140 02/09/2014   K 4.6 02/09/2014   CL 113* 02/09/2014   CREATININE 2.1* 02/09/2014   BUN 36* 02/09/2014   CO2 20 02/09/2014   TSH 11.64* 02/09/2014   HGBA1C 6.2* 07/07/2011    BNP (last 3 results)  Recent Labs  02/09/14 0908  PROBNP 554.0*    Echo Study Conclusions from September 2015  - Left ventricle: The cavity size was normal. Wall thickness was normal. Systolic function was normal. The estimated ejection fraction was in the range of 60% to 65%. Left ventricular diastolic function parameters were normal. - Mitral valve: There was mild regurgitation. - Left atrium: The atrium was mildly dilated. - Right atrium: The atrium was mildly dilated.   Assessment / Plan: 1. History of VT - maintained on chronic low dose amiodarone therapy - no complaints of palpitations.   2. HTN - BP ok on current regimen.   3. Anemia  - on chronic Procrit   4. HLD - on statin therapy   5. Mild LV dysfunction -  most recent echo showing normal EF.   6. Hypothyoidism -  Recent increase in her thyroid dose due to elevated TSH -  I suspect this is due to her amiodarone. Rechecking today.   Seems to be holding her own. Sees Dr. Rayann Coleman in March. I will see again next September.   Patient is agreeable to this plan and will call if any problems develop in the interim.   Burtis Junes, RN, [facility] [address] [place], [place]  [phone]

## 2014-03-10 ENCOUNTER — Other Ambulatory Visit: Payer: Self-pay

## 2014-03-10 ENCOUNTER — Other Ambulatory Visit: Payer: Self-pay | Admitting: *Deleted

## 2014-03-10 DIAGNOSIS — E0590 Thyrotoxicosis, unspecified without thyrotoxic crisis or storm: Secondary | ICD-10-CM

## 2014-03-23 DIAGNOSIS — E782 Mixed hyperlipidemia: Secondary | ICD-10-CM | POA: Diagnosis not present

## 2014-03-23 DIAGNOSIS — E118 Type 2 diabetes mellitus with unspecified complications: Secondary | ICD-10-CM | POA: Diagnosis not present

## 2014-03-30 DIAGNOSIS — E118 Type 2 diabetes mellitus with unspecified complications: Secondary | ICD-10-CM | POA: Diagnosis not present

## 2014-03-30 DIAGNOSIS — E1121 Type 2 diabetes mellitus with diabetic nephropathy: Secondary | ICD-10-CM | POA: Diagnosis not present

## 2014-03-30 DIAGNOSIS — I10 Essential (primary) hypertension: Secondary | ICD-10-CM | POA: Diagnosis not present

## 2014-03-30 DIAGNOSIS — N183 Chronic kidney disease, stage 3 (moderate): Secondary | ICD-10-CM | POA: Diagnosis not present

## 2014-03-31 ENCOUNTER — Encounter (HOSPITAL_COMMUNITY)
Admission: RE | Admit: 2014-03-31 | Discharge: 2014-03-31 | Disposition: A | Discharge status: Home / Self Care | Payer: Medicare Other | Source: Ambulatory Visit | Attending: Internal Medicine | Admitting: Internal Medicine

## 2014-03-31 DIAGNOSIS — D649 Anemia, unspecified: Secondary | ICD-10-CM | POA: Diagnosis not present

## 2014-03-31 LAB — POCT HEMOGLOBIN-HEMACUE: Hemoglobin: 9.9 g/dL — ABNORMAL LOW (ref 12.0–15.0)

## 2014-03-31 MED ORDER — EPOETIN ALFA 10000 UNIT/ML IJ SOLN
30000.0000 [IU] | INTRAMUSCULAR | Status: DC
  Administered 2014-03-31: 10000 [IU] via SUBCUTANEOUS

## 2014-03-31 MED ORDER — EPOETIN ALFA 10000 UNIT/ML IJ SOLN
INTRAMUSCULAR | Status: AC
  Administered 2014-03-31: 10000 [IU] via SUBCUTANEOUS
  Filled 2014-03-31: qty 1

## 2014-03-31 MED ORDER — EPOETIN ALFA 20000 UNIT/ML IJ SOLN
INTRAMUSCULAR | Status: AC
  Administered 2014-03-31: 20000 [IU] via SUBCUTANEOUS
  Filled 2014-03-31: qty 1

## 2014-04-23 DIAGNOSIS — H3531 Nonexudative age-related macular degeneration: Secondary | ICD-10-CM | POA: Diagnosis not present

## 2014-04-23 DIAGNOSIS — H3532 Exudative age-related macular degeneration: Secondary | ICD-10-CM | POA: Diagnosis not present

## 2014-04-27 ENCOUNTER — Encounter (HOSPITAL_COMMUNITY)
Admission: RE | Admit: 2014-04-27 | Discharge: 2014-04-27 | Disposition: A | Discharge status: Home / Self Care | Payer: Medicare Other | Source: Ambulatory Visit | Attending: Internal Medicine | Admitting: Internal Medicine

## 2014-04-27 DIAGNOSIS — D649 Anemia, unspecified: Secondary | ICD-10-CM | POA: Insufficient documentation

## 2014-04-27 LAB — POCT HEMOGLOBIN-HEMACUE: Hemoglobin: 9.9 g/dL — ABNORMAL LOW (ref 12.0–15.0)

## 2014-04-27 MED ORDER — EPOETIN ALFA 20000 UNIT/ML IJ SOLN
INTRAMUSCULAR | Status: AC
  Administered 2014-04-27: 20000 [IU] via SUBCUTANEOUS
  Filled 2014-04-27: qty 1

## 2014-04-27 MED ORDER — EPOETIN ALFA 10000 UNIT/ML IJ SOLN
INTRAMUSCULAR | Status: AC
  Administered 2014-04-27: 10000 [IU] via SUBCUTANEOUS
  Filled 2014-04-27: qty 1

## 2014-04-27 MED ORDER — EPOETIN ALFA 10000 UNIT/ML IJ SOLN: 30000.0000 [IU] | INTRAMUSCULAR | Status: DC

## 2014-04-28 ENCOUNTER — Encounter (HOSPITAL_COMMUNITY): Payer: Medicare Other

## 2014-05-07 ENCOUNTER — Other Ambulatory Visit: Payer: Self-pay | Admitting: Nurse Practitioner

## 2014-05-13 ENCOUNTER — Other Ambulatory Visit: Payer: Self-pay | Admitting: Internal Medicine

## 2014-05-13 MED ORDER — OMEGA-3-ACID ETHYL ESTERS 1 G PO CAPS: 2.0000 g | ORAL_CAPSULE | Freq: Two times a day (BID) | ORAL | Status: DC

## 2014-05-13 MED ORDER — POTASSIUM CHLORIDE ER 10 MEQ PO TBCR: 10.0000 meq | EXTENDED_RELEASE_TABLET | Freq: Every morning | ORAL | Status: DC

## 2014-05-20 ENCOUNTER — Other Ambulatory Visit: Payer: Self-pay | Admitting: Internal Medicine

## 2014-05-25 ENCOUNTER — Encounter (HOSPITAL_COMMUNITY)
Admission: RE | Admit: 2014-05-25 | Discharge: 2014-05-25 | Disposition: A | Discharge status: Home / Self Care | Payer: Medicare Other | Source: Ambulatory Visit | Attending: Internal Medicine | Admitting: Internal Medicine

## 2014-05-25 DIAGNOSIS — D649 Anemia, unspecified: Secondary | ICD-10-CM | POA: Insufficient documentation

## 2014-05-25 LAB — POCT HEMOGLOBIN-HEMACUE: HEMOGLOBIN: 9.7 g/dL — AB (ref 12.0–15.0)

## 2014-05-25 MED ORDER — EPOETIN ALFA 10000 UNIT/ML IJ SOLN: 30000.0000 [IU] | INTRAMUSCULAR | Status: DC

## 2014-05-25 MED ORDER — EPOETIN ALFA 20000 UNIT/ML IJ SOLN
INTRAMUSCULAR | Status: AC
  Administered 2014-05-25: 20000 [IU] via SUBCUTANEOUS
  Filled 2014-05-25: qty 1

## 2014-05-25 MED ORDER — EPOETIN ALFA 10000 UNIT/ML IJ SOLN
INTRAMUSCULAR | Status: AC
  Administered 2014-05-25: 10000 [IU] via SUBCUTANEOUS
  Filled 2014-05-25: qty 1

## 2014-06-01 ENCOUNTER — Telehealth: Payer: Self-pay | Admitting: Internal Medicine

## 2014-06-01 ENCOUNTER — Other Ambulatory Visit (INDEPENDENT_AMBULATORY_CARE_PROVIDER_SITE_OTHER): Payer: Medicare Other | Admitting: *Deleted

## 2014-06-01 ENCOUNTER — Other Ambulatory Visit: Payer: Self-pay | Admitting: *Deleted

## 2014-06-01 DIAGNOSIS — Z8639 Personal history of other endocrine, nutritional and metabolic disease: Secondary | ICD-10-CM

## 2014-06-01 DIAGNOSIS — E0590 Thyrotoxicosis, unspecified without thyrotoxic crisis or storm: Secondary | ICD-10-CM | POA: Diagnosis not present

## 2014-06-01 LAB — TSH: TSH: 8.71 u[IU]/mL — ABNORMAL HIGH (ref 0.35–4.50)

## 2014-06-01 MED ORDER — POTASSIUM CHLORIDE ER 10 MEQ PO TBCR: 10.0000 meq | EXTENDED_RELEASE_TABLET | Freq: Every morning | ORAL | Status: DC

## 2014-06-01 MED ORDER — OMEGA-3-ACID ETHYL ESTERS 1 G PO CAPS: 2.0000 g | ORAL_CAPSULE | Freq: Two times a day (BID) | ORAL | Status: DC

## 2014-06-01 MED ORDER — LEVOTHYROXINE SODIUM 75 MCG PO TABS: 75.0000 ug | ORAL_TABLET | Freq: Every day | ORAL | Status: DC

## 2014-06-01 NOTE — Telephone Encounter (Signed)
Walk in pt Form " needs new rx" Kelly back Tuesday( 1/12) will give to her then

## 2014-06-17 ENCOUNTER — Emergency Department (HOSPITAL_COMMUNITY)
Admission: EM | Admit: 2014-06-17 | Discharge: 2014-06-17 | Discharge status: Against Medical Advice | Payer: Medicare Other | Attending: Emergency Medicine | Admitting: Emergency Medicine

## 2014-06-17 ENCOUNTER — Encounter (HOSPITAL_COMMUNITY): Payer: Self-pay | Admitting: *Deleted

## 2014-06-17 DIAGNOSIS — R040 Epistaxis: Secondary | ICD-10-CM | POA: Insufficient documentation

## 2014-06-17 DIAGNOSIS — E119 Type 2 diabetes mellitus without complications: Secondary | ICD-10-CM | POA: Insufficient documentation

## 2014-06-17 DIAGNOSIS — I10 Essential (primary) hypertension: Secondary | ICD-10-CM | POA: Diagnosis not present

## 2014-06-17 DIAGNOSIS — E669 Obesity, unspecified: Secondary | ICD-10-CM | POA: Diagnosis not present

## 2014-06-17 NOTE — ED Notes (Signed)
PT states that she had a spontaneous nosebleed around 7pm; bleeding controlled upon arrival; pt reports hx of anemia and was concerned about blood loss; family reports that the nosebleed lasted approx 63min

## 2014-06-18 ENCOUNTER — Emergency Department (HOSPITAL_COMMUNITY)
Admission: EM | Admit: 2014-06-18 | Discharge: 2014-06-18 | Disposition: A | Discharge status: Home / Self Care | Payer: Medicare Other | Attending: Emergency Medicine | Admitting: Emergency Medicine

## 2014-06-18 DIAGNOSIS — Z792 Long term (current) use of antibiotics: Secondary | ICD-10-CM | POA: Insufficient documentation

## 2014-06-18 DIAGNOSIS — E119 Type 2 diabetes mellitus without complications: Secondary | ICD-10-CM | POA: Insufficient documentation

## 2014-06-18 DIAGNOSIS — R040 Epistaxis: Secondary | ICD-10-CM

## 2014-06-18 DIAGNOSIS — E669 Obesity, unspecified: Secondary | ICD-10-CM | POA: Insufficient documentation

## 2014-06-18 DIAGNOSIS — I10 Essential (primary) hypertension: Secondary | ICD-10-CM | POA: Insufficient documentation

## 2014-06-18 DIAGNOSIS — E785 Hyperlipidemia, unspecified: Secondary | ICD-10-CM | POA: Diagnosis not present

## 2014-06-18 DIAGNOSIS — E039 Hypothyroidism, unspecified: Secondary | ICD-10-CM | POA: Insufficient documentation

## 2014-06-18 DIAGNOSIS — Z862 Personal history of diseases of the blood and blood-forming organs and certain disorders involving the immune mechanism: Secondary | ICD-10-CM | POA: Diagnosis not present

## 2014-06-18 LAB — CBC WITH DIFFERENTIAL/PLATELET
Basophils Absolute: 0 10*3/uL (ref 0.0–0.1)
Basophils Relative: 0 % (ref 0–1)
EOS ABS: 0.2 10*3/uL (ref 0.0–0.7)
EOS PCT: 3 % (ref 0–5)
HCT: 28.4 % — ABNORMAL LOW (ref 36.0–46.0)
HEMOGLOBIN: 9.1 g/dL — AB (ref 12.0–15.0)
LYMPHS PCT: 22 % (ref 12–46)
Lymphs Abs: 1.6 10*3/uL (ref 0.7–4.0)
MCH: 28.5 pg (ref 26.0–34.0)
MCHC: 32 g/dL (ref 30.0–36.0)
MCV: 89 fL (ref 78.0–100.0)
MONO ABS: 0.5 10*3/uL (ref 0.1–1.0)
Monocytes Relative: 7 % (ref 3–12)
NEUTROS ABS: 4.8 10*3/uL (ref 1.7–7.7)
Neutrophils Relative %: 68 % (ref 43–77)
PLATELETS: 98 10*3/uL — AB (ref 150–400)
RBC: 3.19 MIL/uL — ABNORMAL LOW (ref 3.87–5.11)
RDW: 18 % — ABNORMAL HIGH (ref 11.5–15.5)
SMEAR REVIEW: DECREASED
WBC: 7.1 10*3/uL (ref 4.0–10.5)

## 2014-06-18 LAB — BASIC METABOLIC PANEL
Anion gap: 9 (ref 5–15)
BUN: 30 mg/dL — ABNORMAL HIGH (ref 6–23)
CALCIUM: 9.3 mg/dL (ref 8.4–10.5)
CO2: 20 mmol/L (ref 19–32)
Chloride: 110 mmol/L (ref 96–112)
Creatinine, Ser: 1.73 mg/dL — ABNORMAL HIGH (ref 0.50–1.10)
GFR calc non Af Amer: 26 mL/min — ABNORMAL LOW (ref >90)
GFR, EST AFRICAN AMERICAN: 30 mL/min — AB (ref >90)
Glucose, Bld: 150 mg/dL — ABNORMAL HIGH (ref 70–99)
Potassium: 4.9 mmol/L (ref 3.5–5.1)
Sodium: 139 mmol/L (ref 135–145)

## 2014-06-18 LAB — PROTIME-INR
INR: 1.11 (ref 0.00–1.49)
PROTHROMBIN TIME: 14.4 s (ref 11.6–15.2)

## 2014-06-18 MED ORDER — HYDRALAZINE HCL 20 MG/ML IJ SOLN
10.0000 mg | Freq: Once | INTRAMUSCULAR | Status: AC
  Administered 2014-06-18: 10 mg via INTRAVENOUS
  Filled 2014-06-18: qty 1

## 2014-06-18 MED ORDER — AMOXICILLIN-POT CLAVULANATE 875-125 MG PO TABS: 1.0000 | ORAL_TABLET | Freq: Two times a day (BID) | ORAL | Status: DC

## 2014-06-18 MED ORDER — AMOXICILLIN-POT CLAVULANATE 875-125 MG PO TABS
1.0000 | ORAL_TABLET | Freq: Once | ORAL | Status: AC
  Administered 2014-06-18: 1 via ORAL
  Filled 2014-06-18: qty 1

## 2014-06-18 NOTE — Discharge Instructions (Signed)
Nosebleed Nosebleeds can be caused by many conditions, including trauma, infections, polyps, foreign bodies, dry mucous membranes or climate, medicines, and air conditioning. Most nosebleeds occur in the front of the nose. Because of this location, most nosebleeds can be controlled by pinching the nostrils gently and continuously for at least 10 to 20 minutes. The long, continuous pressure allows enough time for the blood to clot. If pressure is released during that 10 to 20 minute time period, the process may have to be started again. The nosebleed may stop by itself or quit with pressure, or it may need concentrated heating (cautery) or pressure from packing. HOME CARE INSTRUCTIONS   If your nose was packed, try to maintain the pack inside until your health care provider removes it. If a gauze pack was used and it starts to fall out, gently replace it or cut the end off. Do not cut if a balloon catheter was used to pack the nose. Otherwise, do not remove unless instructed.  Avoid blowing your nose for 12 hours after treatment. This could dislodge the pack or clot and start the bleeding again.  If the bleeding starts again, sit up and bend forward, gently pinching the front half of your nose continuously for 20 minutes.  If bleeding was caused by dry mucous membranes, use over-the-counter saline nasal spray or gel. This will keep the mucous membranes moist and allow them to heal. If you must use a lubricant, choose the water-soluble variety. Use it only sparingly and not within several hours of lying down.  Do not use petroleum jelly or mineral oil, as these may drip into the lungs and cause serious problems.  Maintain humidity in your home by using less air conditioning or by using a humidifier.  Do not use aspirin or medicines which make bleeding more likely. Your health care provider can give you recommendations on this.  Resume normal activities as you are able, but try to avoid straining,  lifting, or bending at the waist for several days.  If the nosebleeds become recurrent and the cause is unknown, your health care provider may suggest laboratory tests. SEEK MEDICAL CARE IF: You have a fever. SEEK IMMEDIATE MEDICAL CARE IF:   Bleeding recurs and cannot be controlled.  There is unusual bleeding from or bruising on other parts of the body.  Nosebleeds continue.  There is any worsening of the condition which originally brought you in.  You become light-headed, feel faint, become sweaty, or vomit blood. MAKE SURE YOU:   Understand these instructions.  Will watch your condition.  Will get help right away if you are not doing well or get worse. Document Released: 02/15/2005 Document Revised: 09/22/2013 Document Reviewed: 04/08/2009 [facility] Patient Information 2015 [place], [place]. This information is not intended to replace advice given to you by your health care provider. Make sure you discuss any questions you have with your health care provider.  Hypertension INCREASE YOUR HYDRALAZINE BY ONE 50 MG DOSE IN THE AFTERNOON.CONINUE YOUR REGULAR MEDICATIONS.  Hypertension, commonly called high blood pressure, is when the force of blood pumping through your arteries is too strong. Your arteries are the blood vessels that carry blood from your heart throughout your body. A blood pressure reading consists of a higher number over a lower number, such as 110/72. The higher number (systolic) is the pressure inside your arteries when your heart pumps. The lower number (diastolic) is the pressure inside your arteries when your heart relaxes. Ideally you want your blood pressure  below 120/80. Hypertension forces your heart to work harder to pump blood. Your arteries may become narrow or stiff. Having hypertension puts you at risk for heart disease, stroke, and other problems.  RISK FACTORS Some risk factors for high blood pressure are controllable. Others are not.  Risk factors you  cannot control include:   Race. You may be at higher risk if you are African American.  Age. Risk increases with age.  Gender. Men are at higher risk than women before age 50 years. After age 2, women are at higher risk than men. Risk factors you can control include:  Not getting enough exercise or physical activity.  Being overweight.  Getting too much fat, sugar, calories, or salt in your diet.  Drinking too much alcohol. SIGNS AND SYMPTOMS Hypertension does not usually cause signs or symptoms. Extremely high blood pressure (hypertensive crisis) may cause headache, anxiety, shortness of breath, and nosebleed. DIAGNOSIS  To check if you have hypertension, your health care provider will measure your blood pressure while you are seated, with your arm held at the level of your heart. It should be measured at least twice using the same arm. Certain conditions can cause a difference in blood pressure between your right and left arms. A blood pressure reading that is higher than normal on one occasion does not mean that you need treatment. If one blood pressure reading is high, ask your health care provider about having it checked again. TREATMENT  Treating high blood pressure includes making lifestyle changes and possibly taking medicine. Living a healthy lifestyle can help lower high blood pressure. You may need to change some of your habits. Lifestyle changes may include:  Following the DASH diet. This diet is high in fruits, vegetables, and whole grains. It is low in salt, red meat, and added sugars.  Getting at least 2 hours of brisk physical activity every week.  Losing weight if necessary.  Not smoking.  Limiting alcoholic beverages.  Learning ways to reduce stress. If lifestyle changes are not enough to get your blood pressure under control, your health care provider may prescribe medicine. You may need to take more than one. Work closely with your health care provider to  understand the risks and benefits. HOME CARE INSTRUCTIONS  Have your blood pressure rechecked as directed by your health care provider.   Take medicines only as directed by your health care provider. Follow the directions carefully. Blood pressure medicines must be taken as prescribed. The medicine does not work as well when you skip doses. Skipping doses also puts you at risk for problems.   Do not smoke.   Monitor your blood pressure at home as directed by your health care provider. SEEK MEDICAL CARE IF:   You think you are having a reaction to medicines taken.  You have recurrent headaches or feel dizzy.  You have swelling in your ankles.  You have trouble with your vision. SEEK IMMEDIATE MEDICAL CARE IF:  You develop a severe headache or confusion.  You have unusual weakness, numbness, or feel faint.  You have severe chest or abdominal pain.  You vomit repeatedly.  You have trouble breathing. MAKE SURE YOU:   Understand these instructions.  Will watch your condition.  Will get help right away if you are not doing well or get worse. Document Released: 05/08/2005 Document Revised: 09/22/2013 Document Reviewed: 02/28/2013 [facility] Patient Information 2015 [place], [place]. This information is not intended to replace advice given to you by your health care  provider. Make sure you discuss any questions you have with your health care provider. ° °

## 2014-06-18 NOTE — ED Provider Notes (Signed)
CSN: [id number]     Arrival date & time 06/18/14  [id number] History   First MD Initiated Contact with Patient 06/18/14 [phone]     Chief Complaint  Patient presents with  . Epistaxis     (Consider location/radiation/quality/duration/timing/severity/associated sxs/prior Treatment) HPI  The patient started having a nosebleed around 7 PM yesterday evening. She reports that her family member who is a nurse was able to pack it for her and control the bleeding. The patient reports it had been bleeding out of the right side. She reports that they went to [facility] emergency department but they report there was a 4 hour wait and so they did not stay for treatment. The patient reports the bleeding resumed this morning and bled a lot out of both sides. EMS used Afrin and pressure and the bleeding stopped. They report however she had bleeding that had come out of the left tear duct. Per EMS blood pressure was significantly elevated at systolic of [id number]. Past Medical History  Diagnosis Date  . V-tach     remote VT, treated initially with procainamide, on amiodarone since 2008  . HTN (hypertension)     Last echo in 2004; EF normal  . Hyperlipidemia   . Diabetes mellitus   . Obesity   . Hypothyroidism     on replacement  . Anemia     treated with Procrit  . Gout   . Abnormal PFTs (pulmonary function tests) 2008    No obstruction noted. Has been followed clinically.  . High risk medication use     on amiodarone.   Past Surgical History  Procedure Laterality Date  . Cardioversion  1978  . Cesarean section      x 3  . Abdominal hysterectomy    . US echocardiography  12/29/2002    EF 60-65%  . Cardiovascular stress test  07/07/2003    EF 70%. NO EVIDENCE OF ISCHEMIA   Family History  Problem Relation Age of Onset  . Heart disease Mother   . Heart disease Father    History  Substance Use Topics  . Smoking status: Never Smoker   . Smokeless tobacco: Never Used  . Alcohol Use: No   OB History     No data available     Review of Systems 10 Systems reviewed and are negative for acute change except as noted in the HPI.    Allergies  Review of patient's allergies indicates no known allergies.  Home Medications   Prior to Admission medications   Medication Sig Start Date End Date Taking? Authorizing Provider  acetaminophen (TYLENOL) 500 MG tablet Take 500 mg by mouth at bedtime.     Historical Provider, MD  allopurinol (ZYLOPRIM) 300 MG tablet TAKE 1 TABLET BY MOUTH EVERY DAY 05/21/14   Thompson Grayer, MD  amiodarone (PACERONE) 100 MG tablet Take 1 tablet (100 mg total) by mouth every morning. 06/04/13   Thompson Grayer, MD  amLODipine (NORVASC) 10 MG tablet Take 10 mg by mouth at bedtime.    Historical Provider, MD  amoxicillin-clavulanate (AUGMENTIN) 875-125 MG per tablet Take 1 tablet by mouth 2 (two) times daily. One po bid x 7 days 06/18/14   Charlesetta Shanks, MD  atenolol (TENORMIN) 100 MG tablet Take 0.5 tablets (50 mg total) by mouth 2 (two) times daily. 02/09/14   Burtis Junes, NP  Bevacizumab (AVASTIN) 100 MG/4ML SOLN Inject into the vein every 8 (eight) weeks. Receives from [facility]    Historical  Provider, MD  Epoetin Alfa (PROCRIT IJ) Inject 10,000 Units as directed every 28 (twenty-eight) days. As directed every 4 weeks.    Historical Provider, MD  furosemide (LASIX) 40 MG tablet Take 40 mg by mouth daily as needed. 02/09/14   Burtis Junes, NP  glipiZIDE (GLUCOTROL) 5 MG tablet Take 5 mg by mouth every morning.     Historical Provider, MD  hydrALAZINE (APRESOLINE) 50 MG tablet TAKE 1 TABLET BY MOUTH TWICE A DAY 11/03/13   Thompson Grayer, MD  levothyroxine (SYNTHROID, LEVOTHROID) 75 MCG tablet Take 1 tablet (75 mcg total) by mouth daily before breakfast. 06/01/14   Burtis Junes, NP  Multiple Vitamin (MULTIVITAMIN WITH MINERALS) TABS tablet Take 1 tablet by mouth every morning.    Historical Provider, MD  omega-3 acid ethyl esters (LOVAZA) 1 G capsule Take 2  capsules (2 g total) by mouth 2 (two) times daily. 06/01/14   Burtis Junes, NP  potassium chloride (K-DUR) 10 MEQ tablet Take 1 tablet (10 mEq total) by mouth every morning. 06/01/14   Burtis Junes, NP  pravastatin (PRAVACHOL) 20 MG tablet Take 1 tablet (20 mg total) by mouth at bedtime. 11/05/13   Thompson Grayer, MD  telmisartan-hydrochlorothiazide (MICARDIS HCT) 80-12.5 MG per tablet Take 1 tablet by mouth every morning.    Historical Provider, MD   BP 126/54 mmHg  Pulse 55  Temp(Src) 97.5 F (36.4 C) (Oral)  Resp 13  SpO2 99% Physical Exam  Constitutional: She is oriented to person, place, and time. She appears well-developed and well-nourished.  The patient is alert and in no acute distress. Her mental status is clear. She does have dried blood around her mouth and nose. Time she is not actively bleeding.  HENT:  Head: Normocephalic and atraumatic.  Left TM has a small amount of hemotympanum about less than one third of the drum. Left eye has a small amount of red blood layering at the lid margin from prior bleeding to tear duct. There is no bleeding at this time. Left NARE is occluded by fresh clot. Right NARE has some blood present but does not appear to be occluded by clot. Oropharynx has a small amount of red clot hanging from the nasopharynx.  Neck: Neck supple.  Cardiovascular: Normal rate, regular rhythm and normal heart sounds.   Pulmonary/Chest: Effort normal and breath sounds normal.  Musculoskeletal: Normal range of motion. She exhibits no edema.  Neurological: She is alert and oriented to person, place, and time. No cranial nerve deficit. Coordination normal.  Skin: Skin is warm and dry.  Psychiatric: She has a normal mood and affect.    ED Course  EPISTAXIS MANAGEMENT Date/Time: 06/18/2014 12:55 PM Performed by: Charlesetta Shanks Authorized by: Charlesetta Shanks Treatment site: left posterior Repair method: suction and nasal balloon Post-procedure assessment:  bleeding stopped Treatment complexity: complex Recurrence: recurrence of recent bleed Patient tolerance: Patient tolerated the procedure well with no immediate complications Comments: Neo-Synephrine was used to constrict and loosen clot. No significant amount of clot was identified in the right nare. It was suctioned and blown to clear. The left Carlton Adam had a large amount of fresh clot present. A combination of suction and blowing with manual retrieval was required to evacuated. Once evacuated a 5.5 rapid Rhino was placed. The patient reported it was painful and allow the balloon to only be inflated in small increments. There however was no rebleeding and the balloon passed easily without meeting any significant resistance. It was inflated subsequently  in 2 ml increments to the patient's tolerance of a total of 6 ml. Upon 3 rechecks there was no evidence of rebleeding or any blood in the posterior oropharynx.   (including critical care time) Labs Review Labs Reviewed  BASIC METABOLIC PANEL - Abnormal; Notable for the following:    Glucose, Bld 150 (*)    BUN 30 (*)    Creatinine, Ser 1.73 (*)    GFR calc non Af Amer 26 (*)    GFR calc Af Amer 30 (*)    All other components within normal limits  CBC WITH DIFFERENTIAL/PLATELET - Abnormal; Notable for the following:    RBC 3.19 (*)    Hemoglobin 9.1 (*)    HCT 28.4 (*)    RDW 18.0 (*)    Platelets 98 (*)    All other components within normal limits  PROTIME-INR    Imaging Review No results found.   EKG Interpretation None       11:40 No rebleeding. 39ml added to balloon. 12:39 No rebleeding. 72ml added to to total 6 ml in balloon. BP 135/69. MDM   Final diagnoses:  Epistaxis  Essential hypertension   The patient presented with significantly elevated blood pressure. She does take hydralazine and 1 dose of hydralazine was administered. There were no other associated symptoms except that the patient had developed nasal bleeding  starting last night. This morning it recurred with a large amount of reported bleeding and a large clot present. It was managed as outlined above. The patient is otherwise in good condition the bleeding is controlled with a balloon catheter in place. Patient's blood pressures of normalized. There were no other signs of endorgan damage present.     Charlesetta Shanks, MD 06/18/14 1300

## 2014-06-18 NOTE — ED Notes (Signed)
MD at bedside. ENT cart at bedside. Suction set up.

## 2014-06-18 NOTE — ED Notes (Addendum)
Nose bleed that started last evening; pt. States, "the bleeding started from the rt. Side of nose;" did go to [facility] and they packed the it; since treatment, pt. States, "the bleeding never stopped." According to EMS, "when fire department arrived they gave x 2 nasal sprays of Afrin in each nare." BP high.  EMS: "pt. Does have cont. Bleeding and better controlled." there is blood backing up into the tear ducts, more noticeable in the lt. Eye. Pts. 12 lead ecg showed SA, 1st degree, SB. Pt. Did take bp meds this morning. States, "the packing did come out." According to family (clarification), "Leslie Coleman did not do any packing."

## 2014-06-20 ENCOUNTER — Encounter (HOSPITAL_COMMUNITY): Payer: Self-pay | Admitting: Emergency Medicine

## 2014-06-20 ENCOUNTER — Emergency Department (HOSPITAL_COMMUNITY)
Admission: EM | Admit: 2014-06-20 | Discharge: 2014-06-20 | Disposition: A | Discharge status: Home / Self Care | Payer: Medicare Other | Attending: Emergency Medicine | Admitting: Emergency Medicine

## 2014-06-20 DIAGNOSIS — E785 Hyperlipidemia, unspecified: Secondary | ICD-10-CM | POA: Insufficient documentation

## 2014-06-20 DIAGNOSIS — E119 Type 2 diabetes mellitus without complications: Secondary | ICD-10-CM | POA: Insufficient documentation

## 2014-06-20 DIAGNOSIS — M109 Gout, unspecified: Secondary | ICD-10-CM | POA: Insufficient documentation

## 2014-06-20 DIAGNOSIS — R040 Epistaxis: Secondary | ICD-10-CM

## 2014-06-20 DIAGNOSIS — Z862 Personal history of diseases of the blood and blood-forming organs and certain disorders involving the immune mechanism: Secondary | ICD-10-CM | POA: Insufficient documentation

## 2014-06-20 DIAGNOSIS — I10 Essential (primary) hypertension: Secondary | ICD-10-CM | POA: Diagnosis not present

## 2014-06-20 DIAGNOSIS — E039 Hypothyroidism, unspecified: Secondary | ICD-10-CM | POA: Insufficient documentation

## 2014-06-20 DIAGNOSIS — Z79899 Other long term (current) drug therapy: Secondary | ICD-10-CM | POA: Insufficient documentation

## 2014-06-20 MED ORDER — CEPHALEXIN 500 MG PO CAPS: 1000.0000 mg | ORAL_CAPSULE | Freq: Two times a day (BID) | ORAL | Status: DC

## 2014-06-20 NOTE — ED Provider Notes (Signed)
CSN: [id number]     Arrival date & time 06/20/14  1320 History   First MD Initiated Contact with Patient 06/20/14 1337     Chief Complaint  Patient presents with  . Epistaxis     (Consider location/radiation/quality/duration/timing/severity/associated sxs/prior Treatment) HPI Patient presents to the emergency department with nosebleed from the right nostril.  The patient was here 2 days ago and had packing placed on the left.  Patient states that this morning she started having nosebleed from the right nostril.  She states it was pretty brisk.  The patient states that she has not had any lightheadedness, nausea, vomiting, weakness, dizziness, headache, blurred vision, back pain, neck pain, chest pain, shortness of breath, nasal congestion, runny nose, sore throat, cough, fever or syncope.  The patient states that she applied pressure, but she could not get her nose to stop bleeding Past Medical History  Diagnosis Date  . V-tach     remote VT, treated initially with procainamide, on amiodarone since 2008  . HTN (hypertension)     Last echo in 2004; EF normal  . Hyperlipidemia   . Diabetes mellitus   . Obesity   . Hypothyroidism     on replacement  . Anemia     treated with Procrit  . Gout   . Abnormal PFTs (pulmonary function tests) 2008    No obstruction noted. Has been followed clinically.  . High risk medication use     on amiodarone.   Past Surgical History  Procedure Laterality Date  . Cardioversion  1978  . Cesarean section      x 3  . Abdominal hysterectomy    . US echocardiography  12/29/2002    EF 60-65%  . Cardiovascular stress test  07/07/2003    EF 70%. NO EVIDENCE OF ISCHEMIA   Family History  Problem Relation Age of Onset  . Heart disease Mother   . Heart disease Father    History  Substance Use Topics  . Smoking status: Never Smoker   . Smokeless tobacco: Never Used  . Alcohol Use: No   OB History    No data available     Review of Systems  All  other systems negative except as documented in the HPI. All pertinent positives and negatives as reviewed in the HPI.  Allergies  Review of patient's allergies indicates no known allergies.  Home Medications   Prior to Admission medications   Medication Sig Start Date End Date Taking? Authorizing Provider  acetaminophen (TYLENOL) 500 MG tablet Take 500 mg by mouth at bedtime.     Historical Provider, MD  allopurinol (ZYLOPRIM) 300 MG tablet TAKE 1 TABLET BY MOUTH EVERY DAY 05/21/14   Thompson Grayer, MD  amiodarone (PACERONE) 100 MG tablet Take 1 tablet (100 mg total) by mouth every morning. 06/04/13   Thompson Grayer, MD  amLODipine (NORVASC) 10 MG tablet Take 10 mg by mouth at bedtime.    Historical Provider, MD  amoxicillin-clavulanate (AUGMENTIN) 875-125 MG per tablet Take 1 tablet by mouth 2 (two) times daily. One po bid x 7 days 06/18/14   Charlesetta Shanks, MD  atenolol (TENORMIN) 100 MG tablet Take 0.5 tablets (50 mg total) by mouth 2 (two) times daily. 02/09/14   Burtis Junes, NP  Bevacizumab (AVASTIN) 100 MG/4ML SOLN Inject into the vein every 8 (eight) weeks. Receives from [facility]    Historical Provider, MD  Epoetin Alfa (PROCRIT IJ) Inject 10,000 Units as directed every 28 (twenty-eight) days.  As directed every 4 weeks.    Historical Provider, MD  furosemide (LASIX) 40 MG tablet Take 40 mg by mouth daily as needed. 02/09/14   Burtis Junes, NP  glipiZIDE (GLUCOTROL) 5 MG tablet Take 5 mg by mouth every morning.     Historical Provider, MD  hydrALAZINE (APRESOLINE) 50 MG tablet TAKE 1 TABLET BY MOUTH TWICE A DAY 11/03/13   Thompson Grayer, MD  levothyroxine (SYNTHROID, LEVOTHROID) 75 MCG tablet Take 1 tablet (75 mcg total) by mouth daily before breakfast. 06/01/14   Burtis Junes, NP  Multiple Vitamin (MULTIVITAMIN WITH MINERALS) TABS tablet Take 1 tablet by mouth every morning.    Historical Provider, MD  omega-3 acid ethyl esters (LOVAZA) 1 G capsule Take 2 capsules (2 g  total) by mouth 2 (two) times daily. 06/01/14   Burtis Junes, NP  potassium chloride (K-DUR) 10 MEQ tablet Take 1 tablet (10 mEq total) by mouth every morning. 06/01/14   Burtis Junes, NP  pravastatin (PRAVACHOL) 20 MG tablet Take 1 tablet (20 mg total) by mouth at bedtime. 11/05/13   Thompson Grayer, MD  telmisartan-hydrochlorothiazide (MICARDIS HCT) 80-12.5 MG per tablet Take 1 tablet by mouth every morning.    Historical Provider, MD   BP 123/56 mmHg  Pulse 51  Resp 18  SpO2 96% Physical Exam  Constitutional: She appears well-developed and well-nourished. No distress.  HENT:  Head: Normocephalic and atraumatic.  Nose: Epistaxis is observed.    Eyes: Pupils are equal, round, and reactive to light.  Neck: Normal range of motion. Neck supple.  Cardiovascular: Normal rate, regular rhythm and normal heart sounds.   Pulmonary/Chest: Effort normal and breath sounds normal. No respiratory distress.  Neurological: She is alert. She exhibits normal muscle tone. Coordination normal.  Skin: Skin is warm and dry.  Nursing note and vitals reviewed.   ED Course  EPISTAXIS MANAGEMENT Date/Time: 06/20/2014 3:29 PM Performed by: Brent General Authorized by: Brent General Consent: Verbal consent obtained. Written consent not obtained. Risks and benefits: risks, benefits and alternatives were discussed Consent given by: patient Patient understanding: patient states understanding of the procedure being performed Patient consent: the patient's understanding of the procedure matches consent given Procedure consent: procedure consent matches procedure scheduled Relevant documents: relevant documents present and verified Patient identity confirmed: verbally with patient and arm band Time out: Immediately prior to procedure a "time out" was called to verify the correct patient, procedure, equipment, support staff and site/side marked as required. Preparation: Patient was prepped and  draped in the usual sterile fashion. Local anesthetic: topical anesthetic and lidocaine/prilocaine emulsion Patient sedated: no Treatment site: right posterior Repair method: nasal balloon Post-procedure assessment: bleeding stopped Treatment complexity: complex Patient tolerance: Patient tolerated the procedure well with no immediate complications   I spoke with Dr. Simeon Craft of ENT and he advised that he would be okay to proceed with a second nasal packing in the right nostril.  He also advised to have the patient call Monday morning for follow-up.  She has no current bleeding from the nostril  MDM   Final diagnoses:  None       Brent General, PA-C 06/20/14 1531  Quintella Reichert, MD 06/20/14 [phone]

## 2014-06-20 NOTE — Discharge Instructions (Signed)
Return here as needed.  Follow-up with the ENT doctor you were scheduled to see on Tuesday calling Monday for an appointment for Monday afternoon.  Return here for any worsening in your condition

## 2014-06-20 NOTE — ED Notes (Signed)
Pt states she started having a nosebleed around noon. Pt states she had the Lt side pack on Thursday and now blood coming out out the rt nare. Patient denies being on any blood thinners,

## 2014-06-20 NOTE — ED Notes (Signed)
MD made aware of pot with Nosebleed.

## 2014-06-22 ENCOUNTER — Encounter (HOSPITAL_COMMUNITY): Payer: Medicare Other

## 2014-06-23 DIAGNOSIS — R040 Epistaxis: Secondary | ICD-10-CM | POA: Diagnosis not present

## 2014-06-24 ENCOUNTER — Other Ambulatory Visit: Payer: Self-pay

## 2014-06-24 ENCOUNTER — Other Ambulatory Visit: Payer: Self-pay | Admitting: Internal Medicine

## 2014-06-24 MED ORDER — ATENOLOL 100 MG PO TABS: 50.0000 mg | ORAL_TABLET | Freq: Two times a day (BID) | ORAL | Status: DC

## 2014-06-24 MED ORDER — HYDRALAZINE HCL 50 MG PO TABS: 50.0000 mg | ORAL_TABLET | Freq: Two times a day (BID) | ORAL | Status: DC

## 2014-06-24 MED ORDER — AMIODARONE HCL 100 MG PO TABS: 100.0000 mg | ORAL_TABLET | Freq: Every morning | ORAL | Status: DC

## 2014-06-25 ENCOUNTER — Other Ambulatory Visit: Payer: Self-pay

## 2014-06-27 ENCOUNTER — Other Ambulatory Visit: Payer: Self-pay | Admitting: Internal Medicine

## 2014-06-29 ENCOUNTER — Encounter (HOSPITAL_COMMUNITY)
Admission: RE | Admit: 2014-06-29 | Discharge: 2014-06-29 | Disposition: A | Discharge status: Home / Self Care | Payer: Medicare Other | Source: Ambulatory Visit | Attending: Internal Medicine | Admitting: Internal Medicine

## 2014-06-29 DIAGNOSIS — D649 Anemia, unspecified: Secondary | ICD-10-CM | POA: Insufficient documentation

## 2014-06-29 MED ORDER — EPOETIN ALFA 20000 UNIT/ML IJ SOLN
INTRAMUSCULAR | Status: AC
Start: 2014-06-29 — End: 2014-06-29
  Administered 2014-06-29: 20000 [IU] via SUBCUTANEOUS
  Filled 2014-06-29: qty 1

## 2014-06-29 MED ORDER — EPOETIN ALFA 10000 UNIT/ML IJ SOLN
INTRAMUSCULAR | Status: AC
  Administered 2014-06-29: 10000 [IU] via SUBCUTANEOUS
  Filled 2014-06-29: qty 1

## 2014-06-29 MED ORDER — EPOETIN ALFA 10000 UNIT/ML IJ SOLN: 30000.0000 [IU] | INTRAMUSCULAR | Status: DC

## 2014-06-30 LAB — POCT HEMOGLOBIN-HEMACUE: Hemoglobin: 7.7 g/dL — ABNORMAL LOW (ref 12.0–15.0)

## 2014-07-27 ENCOUNTER — Other Ambulatory Visit (INDEPENDENT_AMBULATORY_CARE_PROVIDER_SITE_OTHER): Payer: Medicare Other | Admitting: *Deleted

## 2014-07-27 ENCOUNTER — Encounter (HOSPITAL_COMMUNITY)
Admission: RE | Admit: 2014-07-27 | Discharge: 2014-07-27 | Disposition: A | Discharge status: Home / Self Care | Payer: Medicare Other | Source: Ambulatory Visit | Attending: Internal Medicine | Admitting: Internal Medicine

## 2014-07-27 DIAGNOSIS — D649 Anemia, unspecified: Secondary | ICD-10-CM | POA: Diagnosis not present

## 2014-07-27 DIAGNOSIS — Z8639 Personal history of other endocrine, nutritional and metabolic disease: Secondary | ICD-10-CM

## 2014-07-27 LAB — TSH: TSH: 3.95 u[IU]/mL (ref 0.35–4.50)

## 2014-07-27 LAB — POCT HEMOGLOBIN-HEMACUE: Hemoglobin: 8.6 g/dL — ABNORMAL LOW (ref 12.0–15.0)

## 2014-07-27 MED ORDER — EPOETIN ALFA 10000 UNIT/ML IJ SOLN
INTRAMUSCULAR | Status: AC
  Administered 2014-07-27: 10000 [IU] via SUBCUTANEOUS
  Filled 2014-07-27: qty 1

## 2014-07-27 MED ORDER — EPOETIN ALFA 10000 UNIT/ML IJ SOLN
30000.0000 [IU] | INTRAMUSCULAR | Status: DC
  Administered 2014-07-27: 10000 [IU] via SUBCUTANEOUS

## 2014-07-27 MED ORDER — EPOETIN ALFA 20000 UNIT/ML IJ SOLN
INTRAMUSCULAR | Status: AC
  Administered 2014-07-27: 20000 [IU] via SUBCUTANEOUS
  Filled 2014-07-27: qty 1

## 2014-07-27 NOTE — Addendum Note (Signed)
Addended by: Eulis Foster on: 07/27/2014 08:29 AM   Modules accepted: Orders

## 2014-07-28 DIAGNOSIS — I10 Essential (primary) hypertension: Secondary | ICD-10-CM | POA: Diagnosis not present

## 2014-07-28 DIAGNOSIS — E780 Pure hypercholesterolemia: Secondary | ICD-10-CM | POA: Diagnosis not present

## 2014-07-28 DIAGNOSIS — E118 Type 2 diabetes mellitus with unspecified complications: Secondary | ICD-10-CM | POA: Diagnosis not present

## 2014-07-28 DIAGNOSIS — Z1389 Encounter for screening for other disorder: Secondary | ICD-10-CM | POA: Diagnosis not present

## 2014-07-28 DIAGNOSIS — Z0000 Encounter for general adult medical examination without abnormal findings: Secondary | ICD-10-CM | POA: Diagnosis not present

## 2014-08-04 DIAGNOSIS — N183 Chronic kidney disease, stage 3 (moderate): Secondary | ICD-10-CM | POA: Diagnosis not present

## 2014-08-04 DIAGNOSIS — E782 Mixed hyperlipidemia: Secondary | ICD-10-CM | POA: Diagnosis not present

## 2014-08-04 DIAGNOSIS — E118 Type 2 diabetes mellitus with unspecified complications: Secondary | ICD-10-CM | POA: Diagnosis not present

## 2014-08-04 DIAGNOSIS — I10 Essential (primary) hypertension: Secondary | ICD-10-CM | POA: Diagnosis not present

## 2014-08-07 ENCOUNTER — Other Ambulatory Visit: Payer: Self-pay | Admitting: Nurse Practitioner

## 2014-08-07 ENCOUNTER — Other Ambulatory Visit: Payer: Self-pay | Admitting: Internal Medicine

## 2014-08-07 NOTE — Telephone Encounter (Signed)
It doesn't look like Dr Rayann Heman manages patients lipids. Ok to refill? Please advise. Thanks, MI

## 2014-08-10 NOTE — Telephone Encounter (Signed)
Ok to fill  Can fill under Truitt Merle as she was the last provider to see patient

## 2014-08-21 ENCOUNTER — Other Ambulatory Visit (HOSPITAL_COMMUNITY): Payer: Self-pay | Admitting: *Deleted

## 2014-08-24 ENCOUNTER — Encounter (HOSPITAL_COMMUNITY)
Admission: RE | Admit: 2014-08-24 | Discharge: 2014-08-24 | Disposition: A | Discharge status: Home / Self Care | Payer: Medicare Other | Source: Ambulatory Visit | Attending: Internal Medicine | Admitting: Internal Medicine

## 2014-08-24 DIAGNOSIS — D649 Anemia, unspecified: Secondary | ICD-10-CM | POA: Diagnosis not present

## 2014-08-24 LAB — POCT HEMOGLOBIN-HEMACUE: Hemoglobin: 9.5 g/dL — ABNORMAL LOW (ref 12.0–15.0)

## 2014-08-24 MED ORDER — EPOETIN ALFA 10000 UNIT/ML IJ SOLN
INTRAMUSCULAR | Status: AC
  Administered 2014-08-24: 10000 [IU] via SUBCUTANEOUS
  Filled 2014-08-24: qty 1

## 2014-08-24 MED ORDER — EPOETIN ALFA 10000 UNIT/ML IJ SOLN: 30000.0000 [IU] | INTRAMUSCULAR | Status: DC

## 2014-08-24 MED ORDER — EPOETIN ALFA 20000 UNIT/ML IJ SOLN
INTRAMUSCULAR | Status: AC
  Administered 2014-08-24: 20000 [IU] via SUBCUTANEOUS
  Filled 2014-08-24: qty 1

## 2014-09-18 ENCOUNTER — Other Ambulatory Visit (HOSPITAL_COMMUNITY): Payer: Self-pay | Admitting: *Deleted

## 2014-09-21 ENCOUNTER — Encounter (HOSPITAL_COMMUNITY)
Admission: RE | Admit: 2014-09-21 | Discharge: 2014-09-21 | Disposition: A | Discharge status: Home / Self Care | Payer: Medicare Other | Source: Ambulatory Visit | Attending: Internal Medicine | Admitting: Internal Medicine

## 2014-09-21 DIAGNOSIS — D649 Anemia, unspecified: Secondary | ICD-10-CM | POA: Diagnosis not present

## 2014-09-21 LAB — POCT HEMOGLOBIN-HEMACUE: HEMOGLOBIN: 9.4 g/dL — AB (ref 12.0–15.0)

## 2014-09-21 MED ORDER — EPOETIN ALFA 10000 UNIT/ML IJ SOLN: 30000.0000 [IU] | INTRAMUSCULAR | Status: DC

## 2014-09-21 MED ORDER — EPOETIN ALFA 10000 UNIT/ML IJ SOLN
INTRAMUSCULAR | Status: AC
  Administered 2014-09-21: 10000 [IU] via SUBCUTANEOUS
  Filled 2014-09-21: qty 1

## 2014-09-21 MED ORDER — EPOETIN ALFA 20000 UNIT/ML IJ SOLN
INTRAMUSCULAR | Status: AC
  Administered 2014-09-21: 20000 [IU] via SUBCUTANEOUS
  Filled 2014-09-21: qty 1

## 2014-10-15 ENCOUNTER — Other Ambulatory Visit: Payer: Self-pay

## 2014-10-15 ENCOUNTER — Encounter: Payer: Self-pay | Admitting: Nurse Practitioner

## 2014-10-15 MED ORDER — AMIODARONE HCL 100 MG PO TABS: 100.0000 mg | ORAL_TABLET | Freq: Every morning | ORAL | Status: DC

## 2014-10-15 NOTE — Telephone Encounter (Signed)
Per note 10.9.15

## 2014-10-20 ENCOUNTER — Encounter (HOSPITAL_COMMUNITY)
Admission: RE | Admit: 2014-10-20 | Discharge: 2014-10-20 | Disposition: A | Discharge status: Home / Self Care | Payer: Medicare Other | Source: Ambulatory Visit | Attending: Internal Medicine | Admitting: Internal Medicine

## 2014-10-20 DIAGNOSIS — D649 Anemia, unspecified: Secondary | ICD-10-CM | POA: Insufficient documentation

## 2014-10-20 LAB — POCT HEMOGLOBIN-HEMACUE: HEMOGLOBIN: 9.1 g/dL — AB (ref 12.0–15.0)

## 2014-10-20 MED ORDER — EPOETIN ALFA 10000 UNIT/ML IJ SOLN
30000.0000 [IU] | INTRAMUSCULAR | Status: DC
  Administered 2014-10-20: 10000 [IU] via SUBCUTANEOUS
  Administered 2014-10-20: 20000 [IU] via SUBCUTANEOUS

## 2014-10-20 MED ORDER — EPOETIN ALFA 10000 UNIT/ML IJ SOLN
INTRAMUSCULAR | Status: AC
  Filled 2014-10-20: qty 1

## 2014-10-20 MED ORDER — EPOETIN ALFA 20000 UNIT/ML IJ SOLN
INTRAMUSCULAR | Status: AC
  Filled 2014-10-20: qty 1

## 2014-10-21 MED FILL — Epoetin Alfa Inj 20000 Unit/ML: INTRAMUSCULAR | Qty: 1 | Status: AC

## 2014-10-28 ENCOUNTER — Encounter: Payer: Self-pay | Admitting: Internal Medicine

## 2014-10-28 ENCOUNTER — Ambulatory Visit (INDEPENDENT_AMBULATORY_CARE_PROVIDER_SITE_OTHER): Payer: Medicare Other | Admitting: Internal Medicine

## 2014-10-28 VITALS — BP 134/72 | HR 49 | Ht 62.0 in | Wt 182.2 lb

## 2014-10-28 DIAGNOSIS — I472 Ventricular tachycardia, unspecified: Secondary | ICD-10-CM

## 2014-10-28 DIAGNOSIS — I10 Essential (primary) hypertension: Secondary | ICD-10-CM

## 2014-10-28 MED ORDER — AMIODARONE HCL 100 MG PO TABS: 100.0000 mg | ORAL_TABLET | Freq: Every morning | ORAL | Status: DC

## 2014-10-28 NOTE — Patient Instructions (Signed)
Medication Instructions:  Your physician recommends that you continue on your current medications as directed. Please refer to the Current Medication list given to you today.   Labwork: None ordered  Testing/Procedures: None ordered  Follow-Up: Your physician wants you to follow-up in: 6 months with Truitt Merle, NP You will receive a reminder letter in the mail two months in advance. If you don't receive a letter, please call our office to schedule the follow-up appointment.   Any Other Special Instructions Will Be Listed Below (If Applicable).

## 2014-10-28 NOTE — Progress Notes (Signed)
PCP: Serena Croissant, MD Primary Cardiologist:  Previously Dr Patrica Duel is a 81 y.o. female with a h/o remote ventricular tachycardia followed previously by Dr Doreatha Lew.  She had EP study (per his report) in the 1980s with no inducible VT.  She was treated initially with procainamide but has been on amiodarone since 2008.  She appears to be doing very well and remains active despite her age.  She denies any recent episodes of arrhythmia.  She is doing very well.  Today, she denies symptoms of palpitations, chest pain, shortness of breath, orthopnea, PND, lower extremity edema, presyncope, syncope, dizziness, or neurologic sequela. The patient is tolerating medications without difficulties and is otherwise without complaint today.   Past Medical History  Diagnosis Date  . V-tach     remote VT, treated initially with procainamide, on amiodarone since 2008  . HTN (hypertension)     Last echo in 2004; EF normal  . Hyperlipidemia   . Diabetes mellitus   . Obesity   . Hypothyroidism     on replacement  . Anemia     treated with Procrit  . Gout   . Abnormal PFTs (pulmonary function tests) 2008    No obstruction noted. Has been followed clinically.  . High risk medication use     on amiodarone.   Past Surgical History  Procedure Laterality Date  . Cardioversion  1978  . Cesarean section      x 3  . Abdominal hysterectomy    . US echocardiography  12/29/2002    EF 60-65%  . Cardiovascular stress test  07/07/2003    EF 70%. NO EVIDENCE OF ISCHEMIA    Current Outpatient Prescriptions  Medication Sig Dispense Refill  . acetaminophen (TYLENOL) 500 MG tablet Take 500 mg by mouth at bedtime.     Marland Kitchen allopurinol (ZYLOPRIM) 300 MG tablet TAKE 1 TABLET BY MOUTH EVERY DAY 90 tablet 1  . amiodarone (PACERONE) 100 MG tablet Take 1 tablet (100 mg total) by mouth every morning. 30 tablet 0  . amLODipine (NORVASC) 10 MG tablet Take 10 mg by mouth at bedtime.    Marland Kitchen atenolol (TENORMIN)  100 MG tablet Take 0.5 tablets (50 mg total) by mouth 2 (two) times daily. 180 tablet 0  . Bevacizumab (AVASTIN) 100 MG/4ML SOLN Inject into the vein every 8 (eight) weeks. Receives from [facility]    . cephALEXin (KEFLEX) 500 MG capsule Take 2 capsules (1,000 mg total) by mouth 2 (two) times daily. 28 capsule 0  . Epoetin Alfa (PROCRIT IJ) Inject 10,000 Units as directed every 28 (twenty-eight) days. As directed every 4 weeks.    . furosemide (LASIX) 40 MG tablet Take 40 mg by mouth daily as needed.    Marland Kitchen glipiZIDE (GLUCOTROL) 5 MG tablet Take 5 mg by mouth every morning.     . hydrALAZINE (APRESOLINE) 50 MG tablet Take 1 tablet (50 mg total) by mouth 2 (two) times daily. 180 tablet 0  . levothyroxine (SYNTHROID, LEVOTHROID) 75 MCG tablet Take 1 tablet (75 mcg total) by mouth daily before breakfast. 30 tablet 9  . Multiple Vitamin (MULTIVITAMIN WITH MINERALS) TABS tablet Take 1 tablet by mouth every morning.    Marland Kitchen omega-3 acid ethyl esters (LOVAZA) 1 G capsule Take 2 capsules (2 g total) by mouth 2 (two) times daily. 60 capsule 9  . potassium chloride (K-DUR) 10 MEQ tablet Take 1 tablet (10 mEq total) by mouth every morning. 30 tablet 9  .  pravastatin (PRAVACHOL) 20 MG tablet TAKE 1 TABLET BY MOUTH AT BEDTIME. 30 tablet 1  . telmisartan-hydrochlorothiazide (MICARDIS HCT) 80-12.5 MG per tablet Take 1 tablet by mouth every morning.     No current facility-administered medications for this visit.    No Known Allergies  History   Social History  . Marital Status: Married    Spouse Name: N/A  . Number of Children: N/A  . Years of Education: N/A   Occupational History  . Not on file.   Social History Main Topics  . Smoking status: Never Smoker   . Smokeless tobacco: Never Used  . Alcohol Use: No  . Drug Use: No  . Sexual Activity: Not Currently   Other Topics Concern  . Not on file   Social History Narrative   Lives with spouse in [place]    Family History   Problem Relation Age of Onset  . Heart disease Mother   . Heart disease Father     Physical Exam: Filed Vitals:   10/28/14 1543  BP: 134/72  Pulse: 49  Height: 5\' 2"  (1.575 m)  Weight: 82.645 kg (182 lb 3.2 oz)    GEN- The patient is well appearing, alert and oriented x 3 today.   Head- normocephalic, atraumatic Eyes-  Sclera clear, conjunctiva pink Ears- hearing intact Oropharynx- clear Neck- supple  Lungs- Clear to ausculation bilaterally, normal work of breathing Heart- bradycardic regular rhythm, no murmurs, rubs or gallops, PMI not laterally displaced GI- soft, NT, ND, + BS Extremities- no clubbing, cyanosis, or edema Neuro- resting tremor seems better today  EKG today reveals sinus rhythm 49 bpm, PR 210  Assessment and Plan:  1. VT Controlled with amiodarone 100mg  daily  2. HTN Stable No change required today  3. HL Stable No change required today   Return to see Cecille Rubin every 6 months.  I will see when needed

## 2014-11-02 DIAGNOSIS — H35051 Retinal neovascularization, unspecified, right eye: Secondary | ICD-10-CM | POA: Diagnosis not present

## 2014-11-02 DIAGNOSIS — H43813 Vitreous degeneration, bilateral: Secondary | ICD-10-CM | POA: Diagnosis not present

## 2014-11-02 DIAGNOSIS — H3532 Exudative age-related macular degeneration: Secondary | ICD-10-CM | POA: Diagnosis not present

## 2014-11-02 DIAGNOSIS — H3531 Nonexudative age-related macular degeneration: Secondary | ICD-10-CM | POA: Diagnosis not present

## 2014-11-05 ENCOUNTER — Other Ambulatory Visit: Payer: Self-pay | Admitting: Nurse Practitioner

## 2014-11-06 ENCOUNTER — Emergency Department (HOSPITAL_COMMUNITY)
Admission: EM | Admit: 2014-11-06 | Discharge: 2014-11-06 | Disposition: A | Discharge status: Home / Self Care | Payer: Medicare Other | Attending: Emergency Medicine | Admitting: Emergency Medicine

## 2014-11-06 ENCOUNTER — Encounter (HOSPITAL_COMMUNITY): Payer: Self-pay | Admitting: *Deleted

## 2014-11-06 DIAGNOSIS — E119 Type 2 diabetes mellitus without complications: Secondary | ICD-10-CM | POA: Diagnosis not present

## 2014-11-06 DIAGNOSIS — R040 Epistaxis: Secondary | ICD-10-CM | POA: Diagnosis not present

## 2014-11-06 DIAGNOSIS — I10 Essential (primary) hypertension: Secondary | ICD-10-CM | POA: Diagnosis not present

## 2014-11-06 DIAGNOSIS — D649 Anemia, unspecified: Secondary | ICD-10-CM | POA: Diagnosis not present

## 2014-11-06 DIAGNOSIS — Z79899 Other long term (current) drug therapy: Secondary | ICD-10-CM | POA: Diagnosis not present

## 2014-11-06 DIAGNOSIS — E669 Obesity, unspecified: Secondary | ICD-10-CM | POA: Diagnosis not present

## 2014-11-06 DIAGNOSIS — M109 Gout, unspecified: Secondary | ICD-10-CM | POA: Diagnosis not present

## 2014-11-06 DIAGNOSIS — E785 Hyperlipidemia, unspecified: Secondary | ICD-10-CM | POA: Insufficient documentation

## 2014-11-06 DIAGNOSIS — I472 Ventricular tachycardia: Secondary | ICD-10-CM | POA: Insufficient documentation

## 2014-11-06 DIAGNOSIS — E039 Hypothyroidism, unspecified: Secondary | ICD-10-CM | POA: Insufficient documentation

## 2014-11-06 NOTE — ED Provider Notes (Signed)
CSN: [id number]     Arrival date & time 11/06/14  [id number] History   First MD Initiated Contact with Patient 11/06/14 0932     Chief Complaint  Patient presents with  . Epistaxis      HPI Patient developed a nosebleed this morning that started approximately 9 AM.  This is coming from her left nare.  She's had nosebleeds intermittently.  She is not on anticoagulants.  She was able to stop this on her own with pressure.  She presents to the emergency department for evaluation.  No other complaints at this time.  No difficulty breathing or swallowing   Past Medical History  Diagnosis Date  . V-tach     remote VT, treated initially with procainamide, on amiodarone since 2008  . HTN (hypertension)     Last echo in 2004; EF normal  . Hyperlipidemia   . Diabetes mellitus   . Obesity   . Hypothyroidism     on replacement  . Anemia     treated with Procrit  . Gout   . Abnormal PFTs (pulmonary function tests) 2008    No obstruction noted. Has been followed clinically.  . High risk medication use     on amiodarone.   Past Surgical History  Procedure Laterality Date  . Cardioversion  1978  . Cesarean section      x 3  . Abdominal hysterectomy    . US echocardiography  12/29/2002    EF 60-65%  . Cardiovascular stress test  07/07/2003    EF 70%. NO EVIDENCE OF ISCHEMIA   Family History  Problem Relation Age of Onset  . Heart disease Mother   . Heart disease Father    History  Substance Use Topics  . Smoking status: Never Smoker   . Smokeless tobacco: Never Used  . Alcohol Use: No   OB History    No data available     Review of Systems  All other systems reviewed and are negative.     Allergies  Review of patient's allergies indicates no known allergies.  Home Medications   Prior to Admission medications   Medication Sig Start Date End Date Taking? Authorizing Provider  acetaminophen (TYLENOL) 500 MG tablet Take 500 mg by mouth at bedtime.    Yes Historical Provider,  MD  allopurinol (ZYLOPRIM) 300 MG tablet TAKE 1 TABLET BY MOUTH EVERY DAY Patient taking differently: TAKE 300 MG BY MOUTH EVERY DAY 05/21/14  Yes Thompson Grayer, MD  amiodarone (PACERONE) 100 MG tablet Take 1 tablet (100 mg total) by mouth every morning. 10/28/14  Yes Thompson Grayer, MD  amLODipine (NORVASC) 10 MG tablet Take 10 mg by mouth at bedtime.   Yes Historical Provider, MD  atenolol (TENORMIN) 100 MG tablet Take 0.5 tablets (50 mg total) by mouth 2 (two) times daily. 06/24/14  Yes Thompson Grayer, MD  Bevacizumab (AVASTIN) 100 MG/4ML SOLN Inject into the vein every 8 (eight) weeks. Receives from [facility]   Yes Historical Provider, MD  Epoetin Alfa (PROCRIT IJ) Inject 10,000 Units as directed every 28 (twenty-eight) days. As directed every 4 weeks.   Yes Historical Provider, MD  furosemide (LASIX) 40 MG tablet Take 40 mg by mouth daily as needed for fluid or edema.  02/09/14  Yes Burtis Junes, NP  glipiZIDE (GLUCOTROL) 5 MG tablet Take 5 mg by mouth every morning.    Yes Historical Provider, MD  hydrALAZINE (APRESOLINE) 50 MG tablet Take 1 tablet (50 mg total)  by mouth 2 (two) times daily. 06/24/14  Yes Thompson Grayer, MD  levothyroxine (SYNTHROID, LEVOTHROID) 75 MCG tablet Take 1 tablet (75 mcg total) by mouth daily before breakfast. 06/01/14  Yes Burtis Junes, NP  Multiple Vitamin (MULTIVITAMIN WITH MINERALS) TABS tablet Take 1 tablet by mouth every morning.   Yes Historical Provider, MD  omega-3 acid ethyl esters (LOVAZA) 1 G capsule Take 2 capsules (2 g total) by mouth 2 (two) times daily. 06/01/14  Yes Burtis Junes, NP  potassium chloride (K-DUR) 10 MEQ tablet Take 1 tablet (10 mEq total) by mouth every morning. 06/01/14  Yes Burtis Junes, NP  pravastatin (PRAVACHOL) 20 MG tablet TAKE 1 TABLET BY MOUTH AT BEDTIME. Patient taking differently: TAKE 20 MG BY MOUTH AT BEDTIME. 11/05/14  Yes Burtis Junes, NP  telmisartan-hydrochlorothiazide (MICARDIS HCT) 80-12.5 MG per tablet  Take 1 tablet by mouth every morning.   Yes Historical Provider, MD  cephALEXin (KEFLEX) 500 MG capsule Take 2 capsules (1,000 mg total) by mouth 2 (two) times daily. Patient not taking: Reported on 11/06/2014 06/20/14   Dalia Heading, PA-C   BP 159/61 mmHg  Pulse 55  Temp(Src) 97.4 F (36.3 C) (Oral)  Resp 18  SpO2 91% Physical Exam  Constitutional: She is oriented to person, place, and time. She appears well-developed and well-nourished.  HENT:  Head: Normocephalic.  Left knee with stigmata of recent bleed.  No active bleeding at this time.  No bleeding at the posterior pharynx.  Eyes: EOM are normal.  Neck: Normal range of motion.  Pulmonary/Chest: Effort normal.  Abdominal: She exhibits no distension.  Musculoskeletal: Normal range of motion.  Neurological: She is alert and oriented to person, place, and time.  Psychiatric: She has a normal mood and affect.  Nursing note and vitals reviewed.   ED Course  Procedures (including critical care time) Labs Review Labs Reviewed - No data to display  Imaging Review No results found.   EKG Interpretation None      MDM   Final diagnoses:  Left-sided epistaxis    No active bleeding this time.  Patient was observed in the emergency department.  Ice applied.  Epistaxis instructions given   Jola Schmidt, MD 11/06/14 [phone]

## 2014-11-06 NOTE — ED Notes (Addendum)
Pt presents to ED with c/o epistaxis, started at 0900 this morning, reports large clots. Pt not on blood thinners. No active bleeding at this time.

## 2014-11-06 NOTE — ED Notes (Signed)
Bed: WA06 Expected date:  Expected time:  Means of arrival:  Comments: Nose bleed

## 2014-11-09 ENCOUNTER — Other Ambulatory Visit: Payer: Self-pay | Admitting: Internal Medicine

## 2014-11-09 ENCOUNTER — Other Ambulatory Visit: Payer: Self-pay

## 2014-11-09 MED ORDER — HYDRALAZINE HCL 50 MG PO TABS: 50.0000 mg | ORAL_TABLET | Freq: Two times a day (BID) | ORAL | Status: DC

## 2014-11-15 ENCOUNTER — Other Ambulatory Visit: Payer: Self-pay | Admitting: Internal Medicine

## 2014-11-16 ENCOUNTER — Encounter (HOSPITAL_COMMUNITY)
Admission: RE | Admit: 2014-11-16 | Discharge: 2014-11-16 | Disposition: A | Discharge status: Home / Self Care | Payer: Medicare Other | Source: Ambulatory Visit | Attending: Internal Medicine | Admitting: Internal Medicine

## 2014-11-16 ENCOUNTER — Other Ambulatory Visit: Payer: Self-pay

## 2014-11-16 DIAGNOSIS — D649 Anemia, unspecified: Secondary | ICD-10-CM | POA: Diagnosis not present

## 2014-11-16 DIAGNOSIS — H35051 Retinal neovascularization, unspecified, right eye: Secondary | ICD-10-CM | POA: Diagnosis not present

## 2014-11-16 DIAGNOSIS — H3532 Exudative age-related macular degeneration: Secondary | ICD-10-CM | POA: Diagnosis not present

## 2014-11-16 LAB — POCT HEMOGLOBIN-HEMACUE: Hemoglobin: 8.6 g/dL — ABNORMAL LOW (ref 12.0–15.0)

## 2014-11-16 MED ORDER — EPOETIN ALFA 10000 UNIT/ML IJ SOLN
30000.0000 [IU] | INTRAMUSCULAR | Status: DC
  Administered 2014-11-16: 30000 [IU] via SUBCUTANEOUS

## 2014-11-16 MED ORDER — EPOETIN ALFA 10000 UNIT/ML IJ SOLN
INTRAMUSCULAR | Status: AC
  Filled 2014-11-16: qty 1

## 2014-11-16 MED ORDER — ALLOPURINOL 300 MG PO TABS: 300.0000 mg | ORAL_TABLET | Freq: Every day | ORAL | Status: DC

## 2014-11-16 MED ORDER — EPOETIN ALFA 20000 UNIT/ML IJ SOLN
INTRAMUSCULAR | Status: AC
  Filled 2014-11-16: qty 1

## 2014-11-17 MED FILL — Epoetin Alfa Inj 20000 Unit/ML: INTRAMUSCULAR | Qty: 1 | Status: AC

## 2014-11-17 MED FILL — Epoetin Alfa Inj 10000 Unit/ML: INTRAMUSCULAR | Qty: 1 | Status: AC

## 2014-12-14 ENCOUNTER — Encounter (HOSPITAL_COMMUNITY)
Admission: RE | Admit: 2014-12-14 | Discharge: 2014-12-14 | Disposition: A | Discharge status: Home / Self Care | Payer: Medicare Other | Source: Ambulatory Visit | Attending: Internal Medicine | Admitting: Internal Medicine

## 2014-12-14 DIAGNOSIS — D649 Anemia, unspecified: Secondary | ICD-10-CM | POA: Diagnosis not present

## 2014-12-14 LAB — POCT HEMOGLOBIN-HEMACUE: HEMOGLOBIN: 9.6 g/dL — AB (ref 12.0–15.0)

## 2014-12-14 MED ORDER — EPOETIN ALFA 20000 UNIT/ML IJ SOLN
INTRAMUSCULAR | Status: AC
  Administered 2014-12-14: 20000 [IU]
  Filled 2014-12-14: qty 1

## 2014-12-14 MED ORDER — EPOETIN ALFA 10000 UNIT/ML IJ SOLN
INTRAMUSCULAR | Status: AC
  Administered 2014-12-14: 10000 [IU]
  Filled 2014-12-14: qty 1

## 2014-12-14 MED ORDER — EPOETIN ALFA 10000 UNIT/ML IJ SOLN: 30000.0000 [IU] | INTRAMUSCULAR | Status: DC

## 2014-12-26 ENCOUNTER — Encounter (HOSPITAL_COMMUNITY): Payer: Self-pay

## 2014-12-26 ENCOUNTER — Emergency Department (HOSPITAL_COMMUNITY)
Admission: EM | Admit: 2014-12-26 | Discharge: 2014-12-26 | Disposition: A | Discharge status: Home / Self Care | Payer: Medicare Other | Attending: Emergency Medicine | Admitting: Emergency Medicine

## 2014-12-26 DIAGNOSIS — I10 Essential (primary) hypertension: Secondary | ICD-10-CM | POA: Diagnosis not present

## 2014-12-26 DIAGNOSIS — Z862 Personal history of diseases of the blood and blood-forming organs and certain disorders involving the immune mechanism: Secondary | ICD-10-CM | POA: Diagnosis not present

## 2014-12-26 DIAGNOSIS — R040 Epistaxis: Secondary | ICD-10-CM | POA: Insufficient documentation

## 2014-12-26 DIAGNOSIS — E785 Hyperlipidemia, unspecified: Secondary | ICD-10-CM | POA: Insufficient documentation

## 2014-12-26 DIAGNOSIS — E669 Obesity, unspecified: Secondary | ICD-10-CM | POA: Insufficient documentation

## 2014-12-26 DIAGNOSIS — Z79899 Other long term (current) drug therapy: Secondary | ICD-10-CM | POA: Diagnosis not present

## 2014-12-26 DIAGNOSIS — M109 Gout, unspecified: Secondary | ICD-10-CM | POA: Diagnosis not present

## 2014-12-26 DIAGNOSIS — E039 Hypothyroidism, unspecified: Secondary | ICD-10-CM | POA: Diagnosis not present

## 2014-12-26 LAB — I-STAT CHEM 8, ED
BUN: 28 mg/dL — AB (ref 6–20)
CHLORIDE: 112 mmol/L — AB (ref 101–111)
CREATININE: 1.7 mg/dL — AB (ref 0.44–1.00)
Calcium, Ion: 1.24 mmol/L (ref 1.13–1.30)
GLUCOSE: 217 mg/dL — AB (ref 65–99)
HCT: 30 % — ABNORMAL LOW (ref 36.0–46.0)
Hemoglobin: 10.2 g/dL — ABNORMAL LOW (ref 12.0–15.0)
Potassium: 4.4 mmol/L (ref 3.5–5.1)
SODIUM: 140 mmol/L (ref 135–145)
TCO2: 17 mmol/L (ref 0–100)

## 2014-12-26 MED ORDER — OXYMETAZOLINE HCL 0.05 % NA SOLN: 1.0000 | Freq: Two times a day (BID) | NASAL | Status: DC | PRN

## 2014-12-26 NOTE — ED Notes (Signed)
Bleeding looks to be clotted.  This RN asked the Pt to stop wiping nose.

## 2014-12-26 NOTE — ED Notes (Addendum)
Pt c/o recurrent epistaxis starting around 0800.  Denies pain.  Pt reports having intermittent nosebleeds since January.  Pt is not followed by ENT.  Denies blood thinners.  Hx of anemia.

## 2014-12-26 NOTE — ED Provider Notes (Signed)
CSN: [id number]     Arrival date & time 12/26/14  0846 History   First MD Initiated Contact with Patient 12/26/14 [phone]     Chief Complaint  Patient presents with  . Epistaxis     (Consider location/radiation/quality/duration/timing/severity/associated sxs/prior Treatment) Patient is a 81 y.o. female presenting with nosebleeds. The history is provided by the patient.  Epistaxis Location:  L nare Severity:  Moderate Duration:  1 hour Timing:  Constant Progression:  Resolved Chronicity:  Recurrent Context: not anticoagulants and not aspirin use   Relieved by:  Nothing Worsened by:  Nothing tried Ineffective treatments:  None tried Associated symptoms: no fever   Frequent nosebleeds: every 2 months.     Past Medical History  Diagnosis Date  . V-tach     remote VT, treated initially with procainamide, on amiodarone since 2008  . HTN (hypertension)     Last echo in 2004; EF normal  . Hyperlipidemia   . Diabetes mellitus   . Obesity   . Hypothyroidism     on replacement  . Anemia     treated with Procrit  . Gout   . Abnormal PFTs (pulmonary function tests) 2008    No obstruction noted. Has been followed clinically.  . High risk medication use     on amiodarone.   Past Surgical History  Procedure Laterality Date  . Cardioversion  1978  . Cesarean section      x 3  . Abdominal hysterectomy    . US echocardiography  12/29/2002    EF 60-65%  . Cardiovascular stress test  07/07/2003    EF 70%. NO EVIDENCE OF ISCHEMIA   Family History  Problem Relation Age of Onset  . Heart disease Mother   . Heart disease Father    History  Substance Use Topics  . Smoking status: Never Smoker   . Smokeless tobacco: Never Used  . Alcohol Use: No   OB History    No data available     Review of Systems  Constitutional: Negative for fever.  HENT: Positive for nosebleeds.   All other systems reviewed and are negative.     Allergies  Review of patient's allergies indicates no  known allergies.  Home Medications   Prior to Admission medications   Medication Sig Start Date End Date Taking? Authorizing Provider  acetaminophen (TYLENOL) 500 MG tablet Take 500 mg by mouth at bedtime.    Yes Historical Provider, MD  allopurinol (ZYLOPRIM) 300 MG tablet Take 1 tablet (300 mg total) by mouth daily. 11/16/14  Yes Thompson Grayer, MD  amiodarone (PACERONE) 100 MG tablet Take 1 tablet (100 mg total) by mouth every morning. 10/28/14  Yes Thompson Grayer, MD  amLODipine (NORVASC) 10 MG tablet Take 10 mg by mouth at bedtime.   Yes Historical Provider, MD  Bevacizumab (AVASTIN) 100 MG/4ML SOLN Inject into the vein every 8 (eight) weeks. Receives from [facility]   Yes Historical Provider, MD  Epoetin Alfa (PROCRIT IJ) Inject 10,000 Units as directed every 28 (twenty-eight) days. As directed every 4 weeks.   Yes Historical Provider, MD  glipiZIDE (GLUCOTROL XL) 5 MG 24 hr tablet Take 5 mg by mouth daily with breakfast.   Yes Historical Provider, MD  hydrALAZINE (APRESOLINE) 50 MG tablet Take 1 tablet (50 mg total) by mouth 2 (two) times daily. 11/09/14  Yes Thompson Grayer, MD  levothyroxine (SYNTHROID, LEVOTHROID) 75 MCG tablet Take 1 tablet (75 mcg total) by mouth daily before breakfast. 06/01/14  Yes Burtis Junes, NP  omega-3 acid ethyl esters (LOVAZA) 1 G capsule Take 2 capsules (2 g total) by mouth 2 (two) times daily. 06/01/14  Yes Burtis Junes, NP  potassium chloride (K-DUR) 10 MEQ tablet Take 1 tablet (10 mEq total) by mouth every morning. 06/01/14  Yes Burtis Junes, NP  pravastatin (PRAVACHOL) 20 MG tablet TAKE 1 TABLET BY MOUTH AT BEDTIME. Patient taking differently: TAKE 20 MG BY MOUTH AT BEDTIME. 11/05/14  Yes Burtis Junes, NP  PRESCRIPTION MEDICATION Anemia Therapy [place]   Yes Historical Provider, MD  telmisartan-hydrochlorothiazide (MICARDIS HCT) 80-12.5 MG per tablet Take 1 tablet by mouth every morning.   Yes Historical Provider, MD  atenolol (TENORMIN) 100  MG tablet Take 0.5 tablets (50 mg total) by mouth 2 (two) times daily. Patient not taking: Reported on 12/26/2014 06/24/14   Thompson Grayer, MD  cephALEXin (KEFLEX) 500 MG capsule Take 2 capsules (1,000 mg total) by mouth 2 (two) times daily. Patient not taking: Reported on 11/06/2014 06/20/14   Dalia Heading, PA-C  oxymetazoline (AFRIN NASAL SPRAY) 0.05 % nasal spray Place 1 spray into both nostrils 2 (two) times daily as needed (nosebleed, no more than 3 days). 12/26/14   Leo Grosser, MD   BP 157/57 mmHg  Pulse 77  Temp(Src) 98.3 F (36.8 C) (Oral)  Resp 22  SpO2 97% Physical Exam  Constitutional: She is oriented to person, place, and time. She appears well-developed and well-nourished. No distress.  HENT:  Head: Normocephalic.  Nose: Epistaxis (dried blood in left nare) is observed.  Eyes: Conjunctivae are normal.  Neck: Neck supple. No tracheal deviation present.  Cardiovascular: Normal rate and regular rhythm.   Pulmonary/Chest: Effort normal. No respiratory distress.  Abdominal: Soft. She exhibits no distension.  Neurological: She is alert and oriented to person, place, and time.  Skin: Skin is warm and dry.  Psychiatric: She has a normal mood and affect.    ED Course  Procedures (including critical care time) Labs Review Labs Reviewed  I-STAT CHEM 8, ED - Abnormal; Notable for the following:    Chloride 112 (*)    BUN 28 (*)    Creatinine, Ser 1.70 (*)    Glucose, Bld 217 (*)    Hemoglobin 10.2 (*)    HCT 30.0 (*)    All other components within normal limits    Imaging Review No results found.   EKG Interpretation None      MDM   Final diagnoses:  Epistaxis, recurrent   Pt presents with recurrent left nare epistaxis. Self-limited prior to arrival. No anemia today. Will refer to ENT outpatient non-emergently as she has had multiple visits for the same. Provided afrin for home useto prevent recurrence in short term.     Leo Grosser, MD 12/26/14 [phone]

## 2014-12-26 NOTE — Discharge Instructions (Signed)

## 2015-01-03 ENCOUNTER — Emergency Department (HOSPITAL_COMMUNITY)
Admission: EM | Admit: 2015-01-03 | Discharge: 2015-01-03 | Disposition: A | Discharge status: Home / Self Care | Payer: Medicare Other | Attending: Physician Assistant | Admitting: Physician Assistant

## 2015-01-03 ENCOUNTER — Encounter (HOSPITAL_COMMUNITY): Payer: Self-pay | Admitting: Emergency Medicine

## 2015-01-03 DIAGNOSIS — E039 Hypothyroidism, unspecified: Secondary | ICD-10-CM | POA: Diagnosis not present

## 2015-01-03 DIAGNOSIS — M109 Gout, unspecified: Secondary | ICD-10-CM | POA: Insufficient documentation

## 2015-01-03 DIAGNOSIS — Z79899 Other long term (current) drug therapy: Secondary | ICD-10-CM | POA: Diagnosis not present

## 2015-01-03 DIAGNOSIS — R040 Epistaxis: Secondary | ICD-10-CM | POA: Insufficient documentation

## 2015-01-03 DIAGNOSIS — E785 Hyperlipidemia, unspecified: Secondary | ICD-10-CM | POA: Diagnosis not present

## 2015-01-03 DIAGNOSIS — E669 Obesity, unspecified: Secondary | ICD-10-CM | POA: Insufficient documentation

## 2015-01-03 DIAGNOSIS — E119 Type 2 diabetes mellitus without complications: Secondary | ICD-10-CM | POA: Diagnosis not present

## 2015-01-03 DIAGNOSIS — Z862 Personal history of diseases of the blood and blood-forming organs and certain disorders involving the immune mechanism: Secondary | ICD-10-CM | POA: Insufficient documentation

## 2015-01-03 DIAGNOSIS — I10 Essential (primary) hypertension: Secondary | ICD-10-CM | POA: Diagnosis not present

## 2015-01-03 MED ORDER — OXYMETAZOLINE HCL 0.05 % NA SOLN
1.0000 | Freq: Once | NASAL | Status: AC
  Administered 2015-01-03: 1 via NASAL
  Filled 2015-01-03: qty 15

## 2015-01-03 MED ORDER — LIDOCAINE HCL (PF) 1 % IJ SOLN
5.0000 mL | Freq: Once | INTRAMUSCULAR | Status: AC
  Administered 2015-01-03: 5 mL
  Filled 2015-01-03: qty 5

## 2015-01-03 NOTE — ED Notes (Signed)
Awake. Verbally responsive. Resp even and unlabored. No audible adventitious breath sounds noted. No bleeding noted from lt nare with packing in place. Pt denies feeling drainage to throat. Family at bedside.

## 2015-01-03 NOTE — ED Notes (Addendum)
Pt awaken with active controlled epitaxis from lt nare and blood draining down throat. Noted with clotting. Pt denies taking blood thinners. Pt denies dizziness/lightheadedness, headaches, LOC but has nausea.

## 2015-01-03 NOTE — ED Notes (Signed)
Dr. Martha Clan at bedside.

## 2015-01-03 NOTE — ED Notes (Signed)
Awake. Verbally responsive. A/O x4. Resp even and unlabored. No audible adventitious breath sounds noted. ABC's intact.  

## 2015-01-03 NOTE — ED Notes (Signed)
MD at bedside placing packing to lt nare. Pt tolerating well. Family at bedside.

## 2015-01-03 NOTE — Discharge Instructions (Signed)
Please follow up with her ENT tomorrow. Please return with any excess bleeding. Nosebleed A nosebleed can be caused by many things, including:  Getting hit hard in the nose.  Infections.  Dry nose.  Colds.  Medicines. Your doctor may do lab testing if you get nosebleeds a lot and the cause is not known. HOME CARE   If your nose was packed with material, keep it there until your doctor takes it out. Put the pack back in your nose if the pack falls out.  Do not blow your nose for 12 hours after the nosebleed.  Sit up and bend forward if your nose starts bleeding again. Pinch the front half of your nose nonstop for 20 minutes.  Put petroleum jelly inside your nose every morning if you have a dry nose.  Use a humidifier to make the air less dry.  Do not take aspirin.  Try not to strain, lift, or bend at the waist for many days after the nosebleed. GET HELP RIGHT AWAY IF:   Nosebleeds keep happening and are hard to stop or control.  You have bleeding or bruises that are not normal on other parts of the body.  You have a fever.  The nosebleeds get worse.  You get lightheaded, feel faint, sweaty, or throw up (vomit) blood. MAKE SURE YOU:   Understand these instructions.  Will watch your condition.  Will get help right away if you are not doing well or get worse. Document Released: 02/15/2008 Document Revised: 07/31/2011 Document Reviewed: 02/15/2008 [facility] Patient Information 2015 [place], [place]. This information is not intended to replace advice given to you by your health care provider. Make sure you discuss any questions you have with your health care provider.

## 2015-01-03 NOTE — ED Provider Notes (Signed)
CSN: [id number]     Arrival date & time 01/03/15  [id number] History   First MD Initiated Contact with Patient 01/03/15 [phone]     Chief Complaint  Patient presents with  . Epistaxis     (Consider location/radiation/quality/duration/timing/severity/associated sxs/prior Treatment) Patient is a 81 y.o. female presenting with nosebleeds. The history is provided by the patient and a relative.  Epistaxis Location:  L nare Severity:  Mild Timing:  Intermittent Progression:  Resolved Chronicity:  Recurrent Context: not anticoagulants, not BiPAP and not bleeding disorder   Relieved by:  Applying pressure Worsened by:  Movement Associated symptoms: blood in oropharynx     Past Medical History  Diagnosis Date  . V-tach     remote VT, treated initially with procainamide, on amiodarone since 2008  . HTN (hypertension)     Last echo in 2004; EF normal  . Hyperlipidemia   . Diabetes mellitus   . Obesity   . Hypothyroidism     on replacement  . Anemia     treated with Procrit  . Gout   . Abnormal PFTs (pulmonary function tests) 2008    No obstruction noted. Has been followed clinically.  . High risk medication use     on amiodarone.   Past Surgical History  Procedure Laterality Date  . Cardioversion  1978  . Cesarean section      x 3  . Abdominal hysterectomy    . US echocardiography  12/29/2002    EF 60-65%  . Cardiovascular stress test  07/07/2003    EF 70%. NO EVIDENCE OF ISCHEMIA   Family History  Problem Relation Age of Onset  . Heart disease Mother   . Heart disease Father    Social History  Substance Use Topics  . Smoking status: Never Smoker   . Smokeless tobacco: Never Used  . Alcohol Use: No   OB History    No data available     Review of Systems  Constitutional: Negative for activity change.  HENT: Positive for nosebleeds.   Respiratory: Negative for shortness of breath.   Cardiovascular: Negative for chest pain.  Gastrointestinal: Negative for abdominal  pain.      Allergies  Review of patient's allergies indicates no known allergies.  Home Medications   Prior to Admission medications   Medication Sig Start Date End Date Taking? Authorizing Provider  acetaminophen (TYLENOL) 500 MG tablet Take 500 mg by mouth at bedtime.     Historical Provider, MD  allopurinol (ZYLOPRIM) 300 MG tablet Take 1 tablet (300 mg total) by mouth daily. 11/16/14   Thompson Grayer, MD  amiodarone (PACERONE) 100 MG tablet Take 1 tablet (100 mg total) by mouth every morning. 10/28/14   Thompson Grayer, MD  amLODipine (NORVASC) 10 MG tablet Take 10 mg by mouth at bedtime.    Historical Provider, MD  atenolol (TENORMIN) 100 MG tablet Take 0.5 tablets (50 mg total) by mouth 2 (two) times daily. Patient not taking: Reported on 12/26/2014 06/24/14   Thompson Grayer, MD  Bevacizumab (AVASTIN) 100 MG/4ML SOLN Inject into the vein every 8 (eight) weeks. Receives from [facility]    Historical Provider, MD  cephALEXin (KEFLEX) 500 MG capsule Take 2 capsules (1,000 mg total) by mouth 2 (two) times daily. Patient not taking: Reported on 11/06/2014 06/20/14   Dalia Heading, PA-C  Epoetin Alfa (PROCRIT IJ) Inject 10,000 Units as directed every 28 (twenty-eight) days. As directed every 4 weeks.    Historical Provider, MD  glipiZIDE (  GLUCOTROL XL) 5 MG 24 hr tablet Take 5 mg by mouth daily with breakfast.    Historical Provider, MD  hydrALAZINE (APRESOLINE) 50 MG tablet Take 1 tablet (50 mg total) by mouth 2 (two) times daily. 11/09/14   Thompson Grayer, MD  levothyroxine (SYNTHROID, LEVOTHROID) 75 MCG tablet Take 1 tablet (75 mcg total) by mouth daily before breakfast. 06/01/14   Burtis Junes, NP  omega-3 acid ethyl esters (LOVAZA) 1 G capsule Take 2 capsules (2 g total) by mouth 2 (two) times daily. 06/01/14   Burtis Junes, NP  oxymetazoline (AFRIN NASAL SPRAY) 0.05 % nasal spray Place 1 spray into both nostrils 2 (two) times daily as needed (nosebleed, no more than 3 days).  12/26/14   Leo Grosser, MD  potassium chloride (K-DUR) 10 MEQ tablet Take 1 tablet (10 mEq total) by mouth every morning. 06/01/14   Burtis Junes, NP  pravastatin (PRAVACHOL) 20 MG tablet TAKE 1 TABLET BY MOUTH AT BEDTIME. Patient taking differently: TAKE 20 MG BY MOUTH AT BEDTIME. 11/05/14   Burtis Junes, NP  PRESCRIPTION MEDICATION Anemia Therapy [place]    Historical Provider, MD  telmisartan-hydrochlorothiazide (MICARDIS HCT) 80-12.5 MG per tablet Take 1 tablet by mouth every morning.    Historical Provider, MD   BP 158/71 mmHg  Pulse 68  Temp(Src) 98.1 F (36.7 C) (Oral)  Resp 20  Ht 5\' 2"  (1.575 m)  Wt 180 lb (81.647 kg)  BMI 32.91 kg/m2  SpO2 97% Physical Exam  Constitutional: She is oriented to person, place, and time. She appears well-developed and well-nourished.  HENT:  Head: Normocephalic and atraumatic.  Caked blood in L nare  Mild blood in back of oropharnyx  Eyes: Right eye exhibits no discharge.  Cardiovascular: Normal rate, regular rhythm and normal heart sounds.   No murmur heard. Pulmonary/Chest: Effort normal.  Abdominal: Soft. She exhibits no distension. There is no tenderness.  Neurological: She is oriented to person, place, and time.  Skin: Skin is warm and dry. She is not diaphoretic.  Psychiatric: She has a normal mood and affect.  Nursing note and vitals reviewed.   ED Course  Procedures (including critical care time) Labs Review Labs Reviewed - No data to display  Imaging Review No results found. I, [place], personally reviewed and evaluated these images and lab results as part of my medical decision-making.   EKG Interpretation None      MDM   Final diagnoses:  None    Patient is a 81 year old female presenting with recurrent epistaxis. Patient has had multiple nosebleeds in the past. She has a ENT appointment tomorrow morning at 10 10 in the morning. She reports waking up and blowing her nose and having a nosebleed the  left nare She tried normal saline drops in her nose, to no effect. On arrival here she is no longer bleeding. She denies any blood thinners.  I will disrupt clot and look for any active bleeding. We will use Afrin and cottonball. We'll watch for one hour if this continues to keep the nosebleed resolved we will discharge home.  Selin Eisler Julio Alm, MD 01/03/15 1625

## 2015-01-04 DIAGNOSIS — R040 Epistaxis: Secondary | ICD-10-CM | POA: Diagnosis not present

## 2015-01-08 ENCOUNTER — Other Ambulatory Visit (HOSPITAL_COMMUNITY): Payer: Self-pay | Admitting: *Deleted

## 2015-01-11 ENCOUNTER — Encounter (HOSPITAL_COMMUNITY)
Admission: RE | Admit: 2015-01-11 | Discharge: 2015-01-11 | Disposition: A | Discharge status: Home / Self Care | Payer: Medicare Other | Source: Ambulatory Visit | Attending: Internal Medicine | Admitting: Internal Medicine

## 2015-01-11 DIAGNOSIS — D649 Anemia, unspecified: Secondary | ICD-10-CM | POA: Diagnosis not present

## 2015-01-11 LAB — POCT HEMOGLOBIN-HEMACUE: HEMOGLOBIN: 8.9 g/dL — AB (ref 12.0–15.0)

## 2015-01-11 MED ORDER — EPOETIN ALFA 20000 UNIT/ML IJ SOLN
INTRAMUSCULAR | Status: AC
  Administered 2015-01-11: 20000 [IU] via SUBCUTANEOUS
  Filled 2015-01-11: qty 1

## 2015-01-11 MED ORDER — EPOETIN ALFA 10000 UNIT/ML IJ SOLN: 30000.0000 [IU] | INTRAMUSCULAR | Status: DC

## 2015-01-11 MED ORDER — EPOETIN ALFA 10000 UNIT/ML IJ SOLN
INTRAMUSCULAR | Status: AC
  Administered 2015-01-11: 10000 [IU] via SUBCUTANEOUS
  Filled 2015-01-11: qty 1

## 2015-01-12 DIAGNOSIS — H43813 Vitreous degeneration, bilateral: Secondary | ICD-10-CM | POA: Diagnosis not present

## 2015-01-12 DIAGNOSIS — H3532 Exudative age-related macular degeneration: Secondary | ICD-10-CM | POA: Diagnosis not present

## 2015-01-12 DIAGNOSIS — H35051 Retinal neovascularization, unspecified, right eye: Secondary | ICD-10-CM | POA: Diagnosis not present

## 2015-01-12 DIAGNOSIS — H3531 Nonexudative age-related macular degeneration: Secondary | ICD-10-CM | POA: Diagnosis not present

## 2015-02-01 ENCOUNTER — Ambulatory Visit: Payer: Medicare Other | Admitting: Nurse Practitioner

## 2015-02-02 ENCOUNTER — Ambulatory Visit: Payer: Medicare Other | Admitting: Nurse Practitioner

## 2015-02-02 DIAGNOSIS — E118 Type 2 diabetes mellitus with unspecified complications: Secondary | ICD-10-CM | POA: Diagnosis not present

## 2015-02-02 DIAGNOSIS — I10 Essential (primary) hypertension: Secondary | ICD-10-CM | POA: Diagnosis not present

## 2015-02-02 DIAGNOSIS — E782 Mixed hyperlipidemia: Secondary | ICD-10-CM | POA: Diagnosis not present

## 2015-02-08 ENCOUNTER — Encounter (HOSPITAL_COMMUNITY)
Admission: RE | Admit: 2015-02-08 | Discharge: 2015-02-08 | Disposition: A | Discharge status: Home / Self Care | Payer: Medicare Other | Source: Ambulatory Visit | Attending: Internal Medicine | Admitting: Internal Medicine

## 2015-02-08 DIAGNOSIS — D649 Anemia, unspecified: Secondary | ICD-10-CM | POA: Diagnosis not present

## 2015-02-08 LAB — POCT HEMOGLOBIN-HEMACUE: Hemoglobin: 8.9 g/dL — ABNORMAL LOW (ref 12.0–15.0)

## 2015-02-08 MED ORDER — EPOETIN ALFA 10000 UNIT/ML IJ SOLN
INTRAMUSCULAR | Status: AC
  Administered 2015-02-08: 10000 [IU] via SUBCUTANEOUS
  Filled 2015-02-08: qty 1

## 2015-02-08 MED ORDER — EPOETIN ALFA 20000 UNIT/ML IJ SOLN
INTRAMUSCULAR | Status: AC
  Administered 2015-02-08: 20000 [IU] via SUBCUTANEOUS
  Filled 2015-02-08: qty 1

## 2015-02-08 MED ORDER — EPOETIN ALFA 10000 UNIT/ML IJ SOLN: 30000.0000 [IU] | INTRAMUSCULAR | Status: DC

## 2015-02-09 ENCOUNTER — Other Ambulatory Visit: Payer: Self-pay | Admitting: Internal Medicine

## 2015-02-09 DIAGNOSIS — I10 Essential (primary) hypertension: Secondary | ICD-10-CM | POA: Diagnosis not present

## 2015-02-09 DIAGNOSIS — M810 Age-related osteoporosis without current pathological fracture: Secondary | ICD-10-CM | POA: Diagnosis not present

## 2015-02-09 DIAGNOSIS — E782 Mixed hyperlipidemia: Secondary | ICD-10-CM | POA: Diagnosis not present

## 2015-02-09 DIAGNOSIS — E118 Type 2 diabetes mellitus with unspecified complications: Secondary | ICD-10-CM | POA: Diagnosis not present

## 2015-02-09 DIAGNOSIS — E1121 Type 2 diabetes mellitus with diabetic nephropathy: Secondary | ICD-10-CM | POA: Diagnosis not present

## 2015-02-28 ENCOUNTER — Other Ambulatory Visit: Payer: Self-pay | Admitting: Nurse Practitioner

## 2015-03-01 DIAGNOSIS — Z23 Encounter for immunization: Secondary | ICD-10-CM | POA: Diagnosis not present

## 2015-03-08 ENCOUNTER — Encounter (HOSPITAL_COMMUNITY)
Admission: RE | Admit: 2015-03-08 | Discharge: 2015-03-08 | Disposition: A | Discharge status: Home / Self Care | Payer: Medicare Other | Source: Ambulatory Visit | Attending: Internal Medicine | Admitting: Internal Medicine

## 2015-03-08 DIAGNOSIS — D649 Anemia, unspecified: Secondary | ICD-10-CM | POA: Diagnosis not present

## 2015-03-08 LAB — POCT HEMOGLOBIN-HEMACUE: Hemoglobin: 9.9 g/dL — ABNORMAL LOW (ref 12.0–15.0)

## 2015-03-08 MED ORDER — EPOETIN ALFA 10000 UNIT/ML IJ SOLN: 30000.0000 [IU] | INTRAMUSCULAR | Status: DC

## 2015-03-08 MED ORDER — EPOETIN ALFA 10000 UNIT/ML IJ SOLN
INTRAMUSCULAR | Status: AC
  Administered 2015-03-08: 10000 [IU] via SUBCUTANEOUS
  Filled 2015-03-08: qty 1

## 2015-03-08 MED ORDER — EPOETIN ALFA 20000 UNIT/ML IJ SOLN
INTRAMUSCULAR | Status: AC
  Administered 2015-03-08: 20000 [IU] via SUBCUTANEOUS
  Filled 2015-03-08: qty 1

## 2015-03-09 DIAGNOSIS — H353211 Exudative age-related macular degeneration, right eye, with active choroidal neovascularization: Secondary | ICD-10-CM | POA: Diagnosis not present

## 2015-03-09 DIAGNOSIS — E113391 Type 2 diabetes mellitus with moderate nonproliferative diabetic retinopathy without macular edema, right eye: Secondary | ICD-10-CM | POA: Diagnosis not present

## 2015-03-09 DIAGNOSIS — H353122 Nonexudative age-related macular degeneration, left eye, intermediate dry stage: Secondary | ICD-10-CM | POA: Diagnosis not present

## 2015-03-09 DIAGNOSIS — E113292 Type 2 diabetes mellitus with mild nonproliferative diabetic retinopathy without macular edema, left eye: Secondary | ICD-10-CM | POA: Diagnosis not present

## 2015-04-05 ENCOUNTER — Encounter (HOSPITAL_COMMUNITY)
Admission: RE | Admit: 2015-04-05 | Discharge: 2015-04-05 | Disposition: A | Discharge status: Home / Self Care | Payer: Medicare Other | Source: Ambulatory Visit | Attending: Internal Medicine | Admitting: Internal Medicine

## 2015-04-05 DIAGNOSIS — D649 Anemia, unspecified: Secondary | ICD-10-CM | POA: Diagnosis not present

## 2015-04-05 LAB — POCT HEMOGLOBIN-HEMACUE: Hemoglobin: 9.5 g/dL — ABNORMAL LOW (ref 12.0–15.0)

## 2015-04-05 MED ORDER — EPOETIN ALFA 20000 UNIT/ML IJ SOLN
INTRAMUSCULAR | Status: AC
  Administered 2015-04-05: 20000 [IU] via SUBCUTANEOUS
  Filled 2015-04-05: qty 1

## 2015-04-05 MED ORDER — EPOETIN ALFA 10000 UNIT/ML IJ SOLN: 30000.0000 [IU] | INTRAMUSCULAR | Status: DC

## 2015-04-05 MED ORDER — EPOETIN ALFA 10000 UNIT/ML IJ SOLN
INTRAMUSCULAR | Status: AC
  Administered 2015-04-05: 10000 [IU] via SUBCUTANEOUS
  Filled 2015-04-05: qty 1

## 2015-04-29 ENCOUNTER — Other Ambulatory Visit: Payer: Self-pay | Admitting: Internal Medicine

## 2015-04-29 DIAGNOSIS — Z8639 Personal history of other endocrine, nutritional and metabolic disease: Secondary | ICD-10-CM

## 2015-04-29 MED ORDER — POTASSIUM CHLORIDE ER 10 MEQ PO TBCR: 10.0000 meq | EXTENDED_RELEASE_TABLET | Freq: Every morning | ORAL | Status: DC

## 2015-04-30 ENCOUNTER — Other Ambulatory Visit (HOSPITAL_COMMUNITY): Payer: Self-pay | Admitting: *Deleted

## 2015-05-03 ENCOUNTER — Encounter (HOSPITAL_COMMUNITY)
Admission: RE | Admit: 2015-05-03 | Discharge: 2015-05-03 | Disposition: A | Discharge status: Home / Self Care | Payer: Medicare Other | Source: Ambulatory Visit | Attending: Internal Medicine | Admitting: Internal Medicine

## 2015-05-03 DIAGNOSIS — D649 Anemia, unspecified: Secondary | ICD-10-CM | POA: Insufficient documentation

## 2015-05-03 LAB — POCT HEMOGLOBIN-HEMACUE: Hemoglobin: 8.8 g/dL — ABNORMAL LOW (ref 12.0–15.0)

## 2015-05-03 MED ORDER — EPOETIN ALFA 10000 UNIT/ML IJ SOLN: 30000.0000 [IU] | INTRAMUSCULAR | Status: DC

## 2015-05-03 MED ORDER — EPOETIN ALFA 20000 UNIT/ML IJ SOLN
INTRAMUSCULAR | Status: AC
  Administered 2015-05-03: 20000 [IU]
  Filled 2015-05-03: qty 1

## 2015-05-03 MED ORDER — EPOETIN ALFA 10000 UNIT/ML IJ SOLN
INTRAMUSCULAR | Status: AC
  Administered 2015-05-03: 10000 [IU]
  Filled 2015-05-03: qty 1

## 2015-05-04 DIAGNOSIS — H353211 Exudative age-related macular degeneration, right eye, with active choroidal neovascularization: Secondary | ICD-10-CM | POA: Diagnosis not present

## 2015-05-04 DIAGNOSIS — E113293 Type 2 diabetes mellitus with mild nonproliferative diabetic retinopathy without macular edema, bilateral: Secondary | ICD-10-CM | POA: Diagnosis not present

## 2015-05-04 DIAGNOSIS — H353122 Nonexudative age-related macular degeneration, left eye, intermediate dry stage: Secondary | ICD-10-CM | POA: Diagnosis not present

## 2015-05-04 DIAGNOSIS — H43813 Vitreous degeneration, bilateral: Secondary | ICD-10-CM | POA: Diagnosis not present

## 2015-05-06 ENCOUNTER — Other Ambulatory Visit: Payer: Self-pay | Admitting: Internal Medicine

## 2015-05-07 ENCOUNTER — Ambulatory Visit: Payer: Medicare Other | Admitting: Nurse Practitioner

## 2015-05-11 ENCOUNTER — Ambulatory Visit (INDEPENDENT_AMBULATORY_CARE_PROVIDER_SITE_OTHER): Payer: Medicare Other | Admitting: Nurse Practitioner

## 2015-05-11 ENCOUNTER — Encounter: Payer: Self-pay | Admitting: Nurse Practitioner

## 2015-05-11 VITALS — BP 130/62 | HR 56 | Ht 62.0 in | Wt 176.1 lb

## 2015-05-11 DIAGNOSIS — I10 Essential (primary) hypertension: Secondary | ICD-10-CM | POA: Diagnosis not present

## 2015-05-11 DIAGNOSIS — I4891 Unspecified atrial fibrillation: Secondary | ICD-10-CM | POA: Diagnosis not present

## 2015-05-11 DIAGNOSIS — E785 Hyperlipidemia, unspecified: Secondary | ICD-10-CM | POA: Diagnosis not present

## 2015-05-11 DIAGNOSIS — I472 Ventricular tachycardia, unspecified: Secondary | ICD-10-CM

## 2015-05-11 DIAGNOSIS — R0600 Dyspnea, unspecified: Secondary | ICD-10-CM | POA: Diagnosis not present

## 2015-05-11 DIAGNOSIS — I480 Paroxysmal atrial fibrillation: Secondary | ICD-10-CM

## 2015-05-11 LAB — LIPID PANEL
Cholesterol: 117 mg/dL — ABNORMAL LOW (ref 125–200)
HDL: 23 mg/dL — ABNORMAL LOW (ref >=46)
LDL Cholesterol: 60 mg/dL (ref <130)
Total CHOL/HDL Ratio: 5.1 Ratio — ABNORMAL HIGH (ref <=5.0)
Triglycerides: 168 mg/dL — ABNORMAL HIGH (ref <150)
VLDL: 34 mg/dL — ABNORMAL HIGH (ref <30)

## 2015-05-11 LAB — BASIC METABOLIC PANEL
BUN: 35 mg/dL — ABNORMAL HIGH (ref 7–25)
CO2: 17 mmol/L — ABNORMAL LOW (ref 20–31)
Calcium: 9.1 mg/dL (ref 8.6–10.4)
Chloride: 110 mmol/L (ref 98–110)
Creat: 2.01 mg/dL — ABNORMAL HIGH (ref 0.60–0.88)
Glucose, Bld: 209 mg/dL — ABNORMAL HIGH (ref 65–99)
Potassium: 4.9 mmol/L (ref 3.5–5.3)
Sodium: 139 mmol/L (ref 135–146)

## 2015-05-11 LAB — HEPATIC FUNCTION PANEL
ALT: 13 U/L (ref 6–29)
AST: 23 U/L (ref 10–35)
Albumin: 4 g/dL (ref 3.6–5.1)
Alkaline Phosphatase: 95 U/L (ref 33–130)
Bilirubin, Direct: 0.1 mg/dL (ref <=0.2)
Indirect Bilirubin: 0.2 mg/dL (ref 0.2–1.2)
Total Bilirubin: 0.3 mg/dL (ref 0.2–1.2)
Total Protein: 6.8 g/dL (ref 6.1–8.1)

## 2015-05-11 LAB — TSH: TSH: 5.811 u[IU]/mL — ABNORMAL HIGH (ref 0.350–4.500)

## 2015-05-11 NOTE — Patient Instructions (Addendum)
We will be checking the following labs today - BMET, HPF, Lipids and TSH   Medication Instructions:    Continue with your current medicines.     Testing/Procedures To Be Arranged:  N/A  Follow-Up:   See me in 6 months    Other Special Instructions:   N/A    If you need a refill on your cardiac medications before your next appointment, please call your pharmacy.   Call the [place] office at [phone] if you have any questions, problems or concerns.

## 2015-05-11 NOTE — Progress Notes (Signed)
CARDIOLOGY OFFICE NOTE  Date:  05/11/2015    Leslie Coleman Date of Birth: Sep 18, 1928 Medical Record [id number]  PCP:  [name], MD  Cardiologist:  [place]    Chief Complaint  Patient presents with  . Hyperlipidemia  . Congestive Heart Failure  . Hypertension    6 month check - seen for Dr. Rayann Heman  . Irregular Heart Beat    History of Present Illness: Leslie Coleman is a 81 y.o. female who presents today for a 6 month check. Seen for Dr. Rayann Heman - former patient of Dr. Susa Simmonds. Has a remote history of VT - treated initially with procainamide but switched to amiodarone back in 2008 due to unavailability of the medicine. Does have chronically abnormal PFTs. Other issues include HTN, gout, obesity, DM and anemia. On chronic Procrit.   I saw her back in September & October os 2015 - seemed to be getting feeble. Had a cold. Chronic DOE noted. More swelling. Weight was up. I added Lasix for a few days and she ended up taking just prn. Cut her atenolol back due to bradycardia and updated her labs and echo. TSH was elevated and we increased her thyroid medicine. She saw Dr. Rayann Heman in June of 2016 and was felt to be stable.   Comes in today. Here alone. Very off balance today despite using a cane here today but uses a walker at home. She says she has fallen - does not remember when the last one but tells me that she has fallen several times. Several nose bleeds. No chest pain. Breathing is ok. Says her "heart is fine". No palpitations. No passing out. Seems more forgetful to me today.    Past Medical History  Diagnosis Date  . V-tach [facility])     remote VT, treated initially with procainamide, on amiodarone since 2008  . HTN (hypertension)     Last echo in 2004; EF normal  . Hyperlipidemia   . Diabetes mellitus   . Obesity   . Hypothyroidism     on replacement  . Anemia     treated with Procrit  . Gout   . Abnormal PFTs (pulmonary function tests) 2008    No  obstruction noted. Has been followed clinically.  . High risk medication use     on amiodarone.    Past Surgical History  Procedure Laterality Date  . Cardioversion  1978  . Cesarean section      x 3  . Abdominal hysterectomy    . US echocardiography  12/29/2002    EF 60-65%  . Cardiovascular stress test  07/07/2003    EF 70%. NO EVIDENCE OF ISCHEMIA     Medications: Current Outpatient Prescriptions  Medication Sig Dispense Refill  . acetaminophen (TYLENOL) 500 MG tablet Take 500 mg by mouth at bedtime.     Marland Kitchen allopurinol (ZYLOPRIM) 300 MG tablet Take 1 tablet (300 mg total) by mouth daily. 90 tablet 1  . amiodarone (PACERONE) 100 MG tablet Take 1 tablet (100 mg total) by mouth every morning. 90 tablet 3  . amLODipine (NORVASC) 10 MG tablet Take 10 mg by mouth at bedtime.    Marland Kitchen atenolol (TENORMIN) 100 MG tablet TAKE 1/2 TABLET BY MOUTH TWICE A DAY 90 tablet 1  . Bevacizumab (AVASTIN) 100 MG/4ML SOLN Inject into the vein every 8 (eight) weeks. Receives from [facility]    . Epoetin Alfa (PROCRIT IJ) Inject 10,000 Units as directed every 28 (twenty-eight) days. As  directed every 4 weeks.    . hydrALAZINE (APRESOLINE) 50 MG tablet Take 1 tablet (50 mg total) by mouth 2 (two) times daily. 180 tablet 2  . levothyroxine (SYNTHROID, LEVOTHROID) 75 MCG tablet TAKE 1 TABLET BY MOUTH EVERY DAY BEFORE BREAKFAST 30 tablet 9  . omega-3 acid ethyl esters (LOVAZA) 1 G capsule Take 2 capsules (2 g total) by mouth 2 (two) times daily. 60 capsule 9  . oxymetazoline (AFRIN NASAL SPRAY) 0.05 % nasal spray Place 1 spray into both nostrils 2 (two) times daily as needed (nosebleed, no more than 3 days). 30 mL 0  . potassium chloride (K-DUR) 10 MEQ tablet Take 1 tablet (10 mEq total) by mouth every morning. 30 tablet 4  . pravastatin (PRAVACHOL) 20 MG tablet TAKE 1 TABLET BY MOUTH AT BEDTIME. (Patient taking differently: TAKE 20 MG BY MOUTH AT BEDTIME.) 30 tablet 9  .  telmisartan-hydrochlorothiazide (MICARDIS HCT) 80-12.5 MG per tablet Take 1 tablet by mouth every morning.     No current facility-administered medications for this visit.    Allergies: No Known Allergies  Social History: The patient  reports that she has never smoked. She has never used smokeless tobacco. She reports that she does not drink alcohol or use illicit drugs.   Family History: The patient's family history includes Heart disease in her father and mother.   Review of Systems: Please see the history of present illness.   Otherwise, the review of systems is positive for none.   All other systems are reviewed and negative.   Physical Exam: VS:  BP 130/62 mmHg  Pulse 56  Ht 5\' 2"  (1.575 m)  Wt 176 lb 1.9 oz (79.888 kg)  BMI 32.20 kg/m2  SpO2 98% .  BMI Body mass index is 32.2 kg/(m^2).  Wt Readings from Last 3 Encounters:  05/11/15 176 lb 1.9 oz (79.888 kg)  01/03/15 180 lb (81.647 kg)  10/28/14 182 lb 3.2 oz (82.645 kg)    General: Pleasant. Chronically ill but in no acute distress.  HEENT: Normal. Neck: Supple, no JVD, carotid bruits, or masses noted.  Cardiac: Regular rate and rhythm. No murmurs, rubs, or gallops. No edema.  Respiratory:  Lungs are clear to auscultation bilaterally with normal work of breathing.  GI: Soft and nontender.  MS: No deformity or atrophy. Gait and ROM intact. Skin: Warm and dry. Color is normal.  Neuro:  Strength and sensation are intact and no gross focal deficits noted.  Psych: Alert, appropriate and with normal affect.   LABORATORY DATA:  EKG:  EKG is not ordered today.   Lab Results  Component Value Date   WBC 7.1 06/18/2014   HGB 8.8* 05/03/2015   HCT 30.0* 12/26/2014   PLT 98* 06/18/2014   GLUCOSE 217* 12/26/2014   CHOL 107 02/09/2014   TRIG 120.0 02/09/2014   HDL 20.10* 02/09/2014   LDLCALC 63 02/09/2014   ALT 16 02/09/2014   AST 25 02/09/2014   NA 140 12/26/2014   K 4.4 12/26/2014   CL 112* 12/26/2014    CREATININE 1.70* 12/26/2014   BUN 28* 12/26/2014   CO2 20 06/18/2014   TSH 3.95 07/27/2014   INR 1.11 06/18/2014   HGBA1C 6.2* 07/07/2011    BNP (last 3 results) No results for input(s): BNP in the last 8760 hours.  ProBNP (last 3 results) No results for input(s): PROBNP in the last 8760 hours.   Other Studies Reviewed Today:  Echo Study Conclusions from September 2015  - Left  ventricle: The cavity size was normal. Wall thickness was normal. Systolic function was normal. The estimated ejection fraction was in the range of 60% to 65%. Left ventricular diastolic function parameters were normal. - Mitral valve: There was mild regurgitation. - Left atrium: The atrium was mildly dilated. - Right atrium: The atrium was mildly dilated.   Assessment / Plan: 1. History of VT - maintained on chronic low dose amiodarone therapy - no complaints of palpitations. She needs to get her labs updated today.   2. HTN - BP ok on current regimen.   3. Anemia - on chronic Procrit - last HGB noted to be 8.8  4. HLD - on statin therapy - rechecking lab today  5. Mild LV dysfunction - most recent echo showing normal EF. She does not seem to be symptomatic.  6. Hypothyoidism -  Rechecking TSH today.   7. History of abnormal PFTs - just followed clinically - fortunately, she has no shortness of breath at this time.   Current medicines are reviewed with the patient today.  The patient does not have concerns regarding medicines other than what has been noted above.  The following changes have been made:  See above.  Labs/ tests ordered today include:    Orders Placed This Encounter  Procedures  . Basic metabolic panel  . Hepatic function panel  . Lipid panel  . TSH     Disposition:   FU with me in 6 months.   Patient is agreeable to this plan and will call if any problems develop in the interim.   Signed: Burtis Junes, RN, ANP-C 05/11/2015 10:49 AM  [facility] [address] [facility] [place], [place]  [address] Phone: [phone] Fax: [phone]

## 2015-05-12 ENCOUNTER — Other Ambulatory Visit: Payer: Self-pay | Admitting: *Deleted

## 2015-05-12 ENCOUNTER — Other Ambulatory Visit: Payer: Self-pay | Admitting: Internal Medicine

## 2015-05-12 DIAGNOSIS — I472 Ventricular tachycardia, unspecified: Secondary | ICD-10-CM

## 2015-05-12 DIAGNOSIS — Z79899 Other long term (current) drug therapy: Secondary | ICD-10-CM

## 2015-05-27 ENCOUNTER — Encounter (HOSPITAL_COMMUNITY)
Admission: RE | Admit: 2015-05-27 | Discharge: 2015-05-27 | Disposition: A | Discharge status: Home / Self Care | Payer: Medicare Other | Source: Ambulatory Visit | Attending: Internal Medicine | Admitting: Internal Medicine

## 2015-05-27 DIAGNOSIS — D649 Anemia, unspecified: Secondary | ICD-10-CM | POA: Insufficient documentation

## 2015-05-27 LAB — POCT HEMOGLOBIN-HEMACUE: Hemoglobin: 8.9 g/dL — ABNORMAL LOW (ref 12.0–15.0)

## 2015-05-27 MED ORDER — EPOETIN ALFA 10000 UNIT/ML IJ SOLN
INTRAMUSCULAR | Status: AC
  Filled 2015-05-27: qty 1

## 2015-05-27 MED ORDER — EPOETIN ALFA 20000 UNIT/ML IJ SOLN
INTRAMUSCULAR | Status: AC
  Administered 2015-05-27: 20000 [IU]
  Filled 2015-05-27: qty 1

## 2015-05-27 MED ORDER — EPOETIN ALFA 10000 UNIT/ML IJ SOLN
30000.0000 [IU] | INTRAMUSCULAR | Status: DC
  Administered 2015-05-27: 10000 [IU] via SUBCUTANEOUS

## 2015-05-31 ENCOUNTER — Encounter (HOSPITAL_COMMUNITY): Payer: No Typology Code available for payment source

## 2015-06-15 ENCOUNTER — Other Ambulatory Visit (INDEPENDENT_AMBULATORY_CARE_PROVIDER_SITE_OTHER): Payer: Medicare Other | Admitting: *Deleted

## 2015-06-15 DIAGNOSIS — Z9289 Personal history of other medical treatment: Secondary | ICD-10-CM

## 2015-06-15 DIAGNOSIS — I472 Ventricular tachycardia, unspecified: Secondary | ICD-10-CM

## 2015-06-15 DIAGNOSIS — Z79899 Other long term (current) drug therapy: Secondary | ICD-10-CM | POA: Diagnosis not present

## 2015-06-15 LAB — TSH: TSH: 4.893 u[IU]/mL — ABNORMAL HIGH (ref 0.350–4.500)

## 2015-06-15 LAB — BASIC METABOLIC PANEL
BUN: 32 mg/dL — ABNORMAL HIGH (ref 7–25)
CO2: 17 mmol/L — ABNORMAL LOW (ref 20–31)
Calcium: 9.3 mg/dL (ref 8.6–10.4)
Chloride: 108 mmol/L (ref 98–110)
Creat: 1.87 mg/dL — ABNORMAL HIGH (ref 0.60–0.88)
Glucose, Bld: 131 mg/dL — ABNORMAL HIGH (ref 65–99)
Potassium: 4.6 mmol/L (ref 3.5–5.3)
Sodium: 139 mmol/L (ref 135–146)

## 2015-06-16 ENCOUNTER — Other Ambulatory Visit: Payer: Self-pay | Admitting: *Deleted

## 2015-06-16 DIAGNOSIS — E0590 Thyrotoxicosis, unspecified without thyrotoxic crisis or storm: Secondary | ICD-10-CM

## 2015-06-21 ENCOUNTER — Encounter (HOSPITAL_COMMUNITY)
Admission: RE | Admit: 2015-06-21 | Discharge: 2015-06-21 | Disposition: A | Discharge status: Home / Self Care | Payer: Medicare Other | Source: Ambulatory Visit | Attending: Internal Medicine | Admitting: Internal Medicine

## 2015-06-21 DIAGNOSIS — D649 Anemia, unspecified: Secondary | ICD-10-CM | POA: Diagnosis not present

## 2015-06-21 LAB — POCT HEMOGLOBIN-HEMACUE: HEMOGLOBIN: 8.9 g/dL — AB (ref 12.0–15.0)

## 2015-06-21 MED ORDER — EPOETIN ALFA 20000 UNIT/ML IJ SOLN
INTRAMUSCULAR | Status: AC
  Administered 2015-06-21: 20000 [IU] via SUBCUTANEOUS
  Filled 2015-06-21: qty 1

## 2015-06-21 MED ORDER — EPOETIN ALFA 10000 UNIT/ML IJ SOLN
INTRAMUSCULAR | Status: AC
  Administered 2015-06-21: 10000 [IU] via SUBCUTANEOUS
  Filled 2015-06-21: qty 1

## 2015-06-21 MED ORDER — EPOETIN ALFA 10000 UNIT/ML IJ SOLN: 30000.0000 [IU] | INTRAMUSCULAR | Status: DC

## 2015-06-30 DIAGNOSIS — E113293 Type 2 diabetes mellitus with mild nonproliferative diabetic retinopathy without macular edema, bilateral: Secondary | ICD-10-CM | POA: Diagnosis not present

## 2015-06-30 DIAGNOSIS — H353211 Exudative age-related macular degeneration, right eye, with active choroidal neovascularization: Secondary | ICD-10-CM | POA: Diagnosis not present

## 2015-06-30 DIAGNOSIS — H353122 Nonexudative age-related macular degeneration, left eye, intermediate dry stage: Secondary | ICD-10-CM | POA: Diagnosis not present

## 2015-06-30 DIAGNOSIS — H43813 Vitreous degeneration, bilateral: Secondary | ICD-10-CM | POA: Diagnosis not present

## 2015-07-19 ENCOUNTER — Encounter (HOSPITAL_COMMUNITY)
Admission: RE | Admit: 2015-07-19 | Discharge: 2015-07-19 | Disposition: A | Discharge status: Home / Self Care | Payer: Medicare Other | Source: Ambulatory Visit | Attending: Internal Medicine | Admitting: Internal Medicine

## 2015-07-19 DIAGNOSIS — D649 Anemia, unspecified: Secondary | ICD-10-CM | POA: Insufficient documentation

## 2015-07-19 LAB — POCT HEMOGLOBIN-HEMACUE: HEMOGLOBIN: 9.5 g/dL — AB (ref 12.0–15.0)

## 2015-07-19 MED ORDER — EPOETIN ALFA 20000 UNIT/ML IJ SOLN
INTRAMUSCULAR | Status: AC
  Administered 2015-07-19: 20000 [IU] via SUBCUTANEOUS
  Filled 2015-07-19: qty 1

## 2015-07-19 MED ORDER — EPOETIN ALFA 10000 UNIT/ML IJ SOLN
INTRAMUSCULAR | Status: AC
  Administered 2015-07-19: 10000 [IU] via SUBCUTANEOUS
  Filled 2015-07-19: qty 1

## 2015-07-19 MED ORDER — EPOETIN ALFA 10000 UNIT/ML IJ SOLN: 30000.0000 [IU] | INTRAMUSCULAR | Status: DC

## 2015-07-26 ENCOUNTER — Other Ambulatory Visit: Payer: Self-pay | Admitting: Nurse Practitioner

## 2015-07-26 ENCOUNTER — Other Ambulatory Visit: Payer: Self-pay | Admitting: Internal Medicine

## 2015-08-01 ENCOUNTER — Other Ambulatory Visit: Payer: Self-pay | Admitting: Internal Medicine

## 2015-08-02 DIAGNOSIS — E1121 Type 2 diabetes mellitus with diabetic nephropathy: Secondary | ICD-10-CM | POA: Diagnosis not present

## 2015-08-02 DIAGNOSIS — I10 Essential (primary) hypertension: Secondary | ICD-10-CM | POA: Diagnosis not present

## 2015-08-02 DIAGNOSIS — E118 Type 2 diabetes mellitus with unspecified complications: Secondary | ICD-10-CM | POA: Diagnosis not present

## 2015-08-02 DIAGNOSIS — E782 Mixed hyperlipidemia: Secondary | ICD-10-CM | POA: Diagnosis not present

## 2015-08-02 DIAGNOSIS — Z0000 Encounter for general adult medical examination without abnormal findings: Secondary | ICD-10-CM | POA: Diagnosis not present

## 2015-08-09 DIAGNOSIS — E118 Type 2 diabetes mellitus with unspecified complications: Secondary | ICD-10-CM | POA: Diagnosis not present

## 2015-08-09 DIAGNOSIS — N390 Urinary tract infection, site not specified: Secondary | ICD-10-CM | POA: Diagnosis not present

## 2015-08-09 DIAGNOSIS — E1121 Type 2 diabetes mellitus with diabetic nephropathy: Secondary | ICD-10-CM | POA: Diagnosis not present

## 2015-08-09 DIAGNOSIS — I10 Essential (primary) hypertension: Secondary | ICD-10-CM | POA: Diagnosis not present

## 2015-08-09 DIAGNOSIS — E782 Mixed hyperlipidemia: Secondary | ICD-10-CM | POA: Diagnosis not present

## 2015-08-10 ENCOUNTER — Other Ambulatory Visit: Payer: Self-pay | Admitting: Internal Medicine

## 2015-08-11 ENCOUNTER — Other Ambulatory Visit: Payer: Self-pay | Admitting: *Deleted

## 2015-08-11 MED ORDER — OMEGA-3-ACID ETHYL ESTERS 1 G PO CAPS: ORAL_CAPSULE | ORAL | Status: DC

## 2015-08-16 ENCOUNTER — Encounter (HOSPITAL_COMMUNITY)
Admission: RE | Admit: 2015-08-16 | Discharge: 2015-08-16 | Disposition: A | Discharge status: Home / Self Care | Payer: Medicare Other | Source: Ambulatory Visit | Attending: Internal Medicine | Admitting: Internal Medicine

## 2015-08-16 ENCOUNTER — Other Ambulatory Visit: Payer: Self-pay | Admitting: *Deleted

## 2015-08-16 ENCOUNTER — Other Ambulatory Visit (INDEPENDENT_AMBULATORY_CARE_PROVIDER_SITE_OTHER): Payer: Medicare Other | Admitting: *Deleted

## 2015-08-16 DIAGNOSIS — D649 Anemia, unspecified: Secondary | ICD-10-CM | POA: Insufficient documentation

## 2015-08-16 DIAGNOSIS — E0590 Thyrotoxicosis, unspecified without thyrotoxic crisis or storm: Secondary | ICD-10-CM

## 2015-08-16 DIAGNOSIS — I4729 Other ventricular tachycardia: Secondary | ICD-10-CM

## 2015-08-16 DIAGNOSIS — Z79899 Other long term (current) drug therapy: Secondary | ICD-10-CM

## 2015-08-16 DIAGNOSIS — I472 Ventricular tachycardia: Secondary | ICD-10-CM

## 2015-08-16 DIAGNOSIS — E038 Other specified hypothyroidism: Secondary | ICD-10-CM

## 2015-08-16 LAB — TSH: TSH: 5.9 mIU/L — ABNORMAL HIGH

## 2015-08-16 LAB — POCT HEMOGLOBIN-HEMACUE: Hemoglobin: 9.2 g/dL — ABNORMAL LOW (ref 12.0–15.0)

## 2015-08-16 MED ORDER — EPOETIN ALFA 20000 UNIT/ML IJ SOLN
INTRAMUSCULAR | Status: AC
  Administered 2015-08-16: 20000 [IU]
  Filled 2015-08-16: qty 1

## 2015-08-16 MED ORDER — EPOETIN ALFA 10000 UNIT/ML IJ SOLN
30000.0000 [IU] | INTRAMUSCULAR | Status: DC
  Administered 2015-08-16: 10000 [IU] via SUBCUTANEOUS

## 2015-08-16 MED ORDER — LEVOTHYROXINE SODIUM 88 MCG PO TABS: 88.0000 ug | ORAL_TABLET | Freq: Every day | ORAL | Status: DC

## 2015-08-16 MED ORDER — EPOETIN ALFA 10000 UNIT/ML IJ SOLN
INTRAMUSCULAR | Status: AC
  Filled 2015-08-16: qty 1

## 2015-08-22 ENCOUNTER — Other Ambulatory Visit: Payer: Self-pay | Admitting: Nurse Practitioner

## 2015-08-24 DIAGNOSIS — H353122 Nonexudative age-related macular degeneration, left eye, intermediate dry stage: Secondary | ICD-10-CM | POA: Diagnosis not present

## 2015-08-24 DIAGNOSIS — H43813 Vitreous degeneration, bilateral: Secondary | ICD-10-CM | POA: Diagnosis not present

## 2015-08-24 DIAGNOSIS — E113293 Type 2 diabetes mellitus with mild nonproliferative diabetic retinopathy without macular edema, bilateral: Secondary | ICD-10-CM | POA: Diagnosis not present

## 2015-08-24 DIAGNOSIS — H353211 Exudative age-related macular degeneration, right eye, with active choroidal neovascularization: Secondary | ICD-10-CM | POA: Diagnosis not present

## 2015-09-13 ENCOUNTER — Encounter (HOSPITAL_COMMUNITY)
Admission: RE | Admit: 2015-09-13 | Discharge: 2015-09-13 | Disposition: A | Discharge status: Home / Self Care | Payer: Medicare Other | Source: Ambulatory Visit | Attending: Internal Medicine | Admitting: Internal Medicine

## 2015-09-13 DIAGNOSIS — D649 Anemia, unspecified: Secondary | ICD-10-CM | POA: Diagnosis not present

## 2015-09-13 MED ORDER — EPOETIN ALFA 10000 UNIT/ML IJ SOLN
INTRAMUSCULAR | Status: AC
  Administered 2015-09-13: 10000 [IU] via SUBCUTANEOUS
  Filled 2015-09-13: qty 1

## 2015-09-13 MED ORDER — EPOETIN ALFA 20000 UNIT/ML IJ SOLN
INTRAMUSCULAR | Status: AC
  Administered 2015-09-13: 20000 [IU] via SUBCUTANEOUS
  Filled 2015-09-13: qty 1

## 2015-09-13 MED ORDER — EPOETIN ALFA 10000 UNIT/ML IJ SOLN: 30000.0000 [IU] | INTRAMUSCULAR | Status: DC

## 2015-09-14 LAB — POCT HEMOGLOBIN-HEMACUE: HEMOGLOBIN: 9 g/dL — AB (ref 12.0–15.0)

## 2015-09-29 ENCOUNTER — Encounter (HOSPITAL_COMMUNITY): Payer: Self-pay | Admitting: Emergency Medicine

## 2015-09-29 ENCOUNTER — Emergency Department (HOSPITAL_COMMUNITY)
Admission: EM | Admit: 2015-09-29 | Discharge: 2015-09-29 | Disposition: A | Discharge status: Home / Self Care | Payer: Medicare Other | Attending: Emergency Medicine | Admitting: Emergency Medicine

## 2015-09-29 ENCOUNTER — Emergency Department (HOSPITAL_COMMUNITY): Payer: Medicare Other

## 2015-09-29 DIAGNOSIS — R0602 Shortness of breath: Secondary | ICD-10-CM | POA: Diagnosis not present

## 2015-09-29 DIAGNOSIS — R42 Dizziness and giddiness: Secondary | ICD-10-CM | POA: Insufficient documentation

## 2015-09-29 DIAGNOSIS — E785 Hyperlipidemia, unspecified: Secondary | ICD-10-CM | POA: Insufficient documentation

## 2015-09-29 DIAGNOSIS — E119 Type 2 diabetes mellitus without complications: Secondary | ICD-10-CM | POA: Diagnosis not present

## 2015-09-29 DIAGNOSIS — Z79899 Other long term (current) drug therapy: Secondary | ICD-10-CM | POA: Insufficient documentation

## 2015-09-29 DIAGNOSIS — Z862 Personal history of diseases of the blood and blood-forming organs and certain disorders involving the immune mechanism: Secondary | ICD-10-CM | POA: Insufficient documentation

## 2015-09-29 DIAGNOSIS — Z7984 Long term (current) use of oral hypoglycemic drugs: Secondary | ICD-10-CM | POA: Diagnosis not present

## 2015-09-29 DIAGNOSIS — E669 Obesity, unspecified: Secondary | ICD-10-CM | POA: Insufficient documentation

## 2015-09-29 DIAGNOSIS — M109 Gout, unspecified: Secondary | ICD-10-CM | POA: Diagnosis not present

## 2015-09-29 DIAGNOSIS — N390 Urinary tract infection, site not specified: Secondary | ICD-10-CM | POA: Diagnosis not present

## 2015-09-29 DIAGNOSIS — I10 Essential (primary) hypertension: Secondary | ICD-10-CM | POA: Insufficient documentation

## 2015-09-29 DIAGNOSIS — E039 Hypothyroidism, unspecified: Secondary | ICD-10-CM | POA: Insufficient documentation

## 2015-09-29 DIAGNOSIS — R630 Anorexia: Secondary | ICD-10-CM | POA: Diagnosis not present

## 2015-09-29 LAB — COMPREHENSIVE METABOLIC PANEL
ALK PHOS: 109 U/L (ref 38–126)
ALT: 16 U/L (ref 14–54)
ANION GAP: 10 (ref 5–15)
AST: 27 U/L (ref 15–41)
Albumin: 2.9 g/dL — ABNORMAL LOW (ref 3.5–5.0)
BUN: 39 mg/dL — ABNORMAL HIGH (ref 6–20)
CO2: 16 mmol/L — ABNORMAL LOW (ref 22–32)
Calcium: 8.7 mg/dL — ABNORMAL LOW (ref 8.9–10.3)
Chloride: 114 mmol/L — ABNORMAL HIGH (ref 101–111)
Creatinine, Ser: 2.21 mg/dL — ABNORMAL HIGH (ref 0.44–1.00)
GFR, EST AFRICAN AMERICAN: 22 mL/min — AB (ref >60)
GFR, EST NON AFRICAN AMERICAN: 19 mL/min — AB (ref >60)
Glucose, Bld: 83 mg/dL (ref 65–99)
Potassium: 5.1 mmol/L (ref 3.5–5.1)
SODIUM: 140 mmol/L (ref 135–145)
Total Bilirubin: 0.7 mg/dL (ref 0.3–1.2)
Total Protein: 6.8 g/dL (ref 6.5–8.1)

## 2015-09-29 LAB — CBC WITH DIFFERENTIAL/PLATELET
BASOS PCT: 0 %
Basophils Absolute: 0 10*3/uL (ref 0.0–0.1)
EOS ABS: 0.2 10*3/uL (ref 0.0–0.7)
Eosinophils Relative: 1 %
HCT: 29.4 % — ABNORMAL LOW (ref 36.0–46.0)
HEMOGLOBIN: 9.8 g/dL — AB (ref 12.0–15.0)
LYMPHS PCT: 9 %
Lymphs Abs: 1.9 10*3/uL (ref 0.7–4.0)
MCH: 28.9 pg (ref 26.0–34.0)
MCHC: 33.3 g/dL (ref 30.0–36.0)
MCV: 86.7 fL (ref 78.0–100.0)
MONO ABS: 1.9 10*3/uL — AB (ref 0.1–1.0)
Monocytes Relative: 9 %
NEUTROS ABS: 16.6 10*3/uL — AB (ref 1.7–7.7)
Neutrophils Relative %: 81 %
Platelets: 106 10*3/uL — ABNORMAL LOW (ref 150–400)
RBC: 3.39 MIL/uL — ABNORMAL LOW (ref 3.87–5.11)
RDW: 18.7 % — ABNORMAL HIGH (ref 11.5–15.5)
WBC: 20.6 10*3/uL — ABNORMAL HIGH (ref 4.0–10.5)

## 2015-09-29 LAB — URINE MICROSCOPIC-ADD ON

## 2015-09-29 LAB — URINALYSIS, ROUTINE W REFLEX MICROSCOPIC
BILIRUBIN URINE: NEGATIVE
GLUCOSE, UA: NEGATIVE mg/dL
Hgb urine dipstick: NEGATIVE
KETONES UR: NEGATIVE mg/dL
NITRITE: POSITIVE — AB
PROTEIN: 100 mg/dL — AB
Specific Gravity, Urine: 1.017 (ref 1.005–1.030)
pH: 5.5 (ref 5.0–8.0)

## 2015-09-29 LAB — CBG MONITORING, ED: Glucose-Capillary: 82 mg/dL (ref 65–99)

## 2015-09-29 LAB — I-STAT TROPONIN, ED: TROPONIN I, POC: 0 ng/mL (ref 0.00–0.08)

## 2015-09-29 MED ORDER — CEFTRIAXONE SODIUM 1 G IJ SOLR
1.0000 g | Freq: Once | INTRAMUSCULAR | Status: AC
  Administered 2015-09-29: 1 g via INTRAVENOUS
  Filled 2015-09-29: qty 10

## 2015-09-29 MED ORDER — SODIUM CHLORIDE 0.9 % IV BOLUS (SEPSIS)
500.0000 mL | Freq: Once | INTRAVENOUS | Status: AC
  Administered 2015-09-29: 500 mL via INTRAVENOUS

## 2015-09-29 MED ORDER — CEPHALEXIN 500 MG PO CAPS: 500.0000 mg | ORAL_CAPSULE | Freq: Two times a day (BID) | ORAL | Status: DC

## 2015-09-29 NOTE — ED Notes (Signed)
Encouraged pt to attempt to obtain urine sample at this time via bedpan. Pt refused need to void. IV fluids initiated and will return to place pt on the bedpan within the next 40 minutes. Pt verbalizes understanding and NT made aware of plan.

## 2015-09-29 NOTE — ED Notes (Signed)
Writer made pt aware that urine is needed for a sample and if pt cannot go then In and Out cath will be done.

## 2015-09-29 NOTE — ED Notes (Signed)
Attempted PIV x2 unsuccessful. Charge RN made aware and will attempt to place line asap

## 2015-09-29 NOTE — ED Provider Notes (Signed)
CSN: [id number]     Arrival date & time 09/29/15  1554 History   First MD Initiated Contact with Patient 09/29/15 1807     Chief Complaint  Patient presents with  . Dizziness     (Consider location/radiation/quality/duration/timing/severity/associated sxs/prior Treatment) HPI Comments: Patient with a history of HTN, Hyperlipidemia, DM, Anemia, presents today with a chief complaint of dizziness.  She describes the dizziness as both feeling lightheaded and also feeling like the room is spinning.  She states that she had a similar episode three days ago, which lasted for a few minutes and then resolved.  She states that the dizziness again presented this morning when she woke up and has been constant since that time.  She denies syncope, vision changes, headache, chest pain, SOB, palpitations, fever, chills, focal weakness, confusion, numbness, tingling, facial asymmetry, or any other symptoms.  Family report that the patient has had a decreased appetite and has been eating less over the past couple of days.  She is currently on Atenolol, Amlodipine, Hydralazine, and Micardis for HTN.  She reports that she has been compliant with her medications and denies any recent changes in medications.    The history is provided by the patient.    Past Medical History  Diagnosis Date  . [facility])     remote VT, treated initially with procainamide, on amiodarone since 2008  . HTN (hypertension)     Last echo in 2004; EF normal  . Hyperlipidemia   . Diabetes mellitus   . Obesity   . Hypothyroidism     on replacement  . Anemia     treated with Procrit  . Gout   . Abnormal PFTs (pulmonary function tests) 2008    No obstruction noted. Has been followed clinically.  . High risk medication use     on amiodarone.   Past Surgical History  Procedure Laterality Date  . Cardioversion  1978  . Cesarean section      x 3  . Abdominal hysterectomy    . US echocardiography  12/29/2002    EF 60-65%  .  Cardiovascular stress test  07/07/2003    EF 70%. NO EVIDENCE OF ISCHEMIA   Family History  Problem Relation Age of Onset  . Heart disease Mother   . Heart disease Father    Social History  Substance Use Topics  . Smoking status: Never Smoker   . Smokeless tobacco: Never Used  . Alcohol Use: No   OB History    No data available     Review of Systems  All other systems reviewed and are negative.     Allergies  Review of patient's allergies indicates no known allergies.  Home Medications   Prior to Admission medications   Medication Sig Start Date End Date Taking? Authorizing Provider  acetaminophen (TYLENOL) 500 MG tablet Take 500 mg by mouth at bedtime.    Yes Historical Provider, MD  allopurinol (ZYLOPRIM) 300 MG tablet TAKE 1 TABLET BY MOUTH EVERY DAY 05/12/15  Yes Thompson Grayer, MD  amiodarone (PACERONE) 100 MG tablet TAKE 1 TABLET BY MOUTH EVERY MORNING 07/26/15  Yes Thompson Grayer, MD  amLODipine (NORVASC) 10 MG tablet Take 10 mg by mouth at bedtime.   Yes Historical Provider, MD  atenolol (TENORMIN) 100 MG tablet TAKE 1/2 TABLET BY MOUTH TWICE A DAY 05/06/15  Yes Thompson Grayer, MD  Epoetin Alfa (PROCRIT IJ) Inject 10,000 Units as directed every 28 (twenty-eight) days. As directed every 4 weeks.  Yes Historical Provider, MD  glipiZIDE (GLUCOTROL XL) 5 MG 24 hr tablet Take 5 mg by mouth daily. 07/12/15  Yes Historical Provider, MD  hydrALAZINE (APRESOLINE) 50 MG tablet TAKE 1 TABLET BY MOUTH TWICE A DAY 08/03/15  Yes Thompson Grayer, MD  levothyroxine (SYNTHROID, LEVOTHROID) 88 MCG tablet Take 1 tablet (88 mcg total) by mouth daily before breakfast. 08/16/15  Yes Burtis Junes, NP  omega-3 acid ethyl esters (LOVAZA) 1 g capsule TAKE 2 CAPSULES BY MOUTH 2 TIMES DAILY. 08/11/15  Yes Burtis Junes, NP  potassium chloride (K-DUR) 10 MEQ tablet Take 1 tablet (10 mEq total) by mouth every morning. 04/29/15  Yes Thompson Grayer, MD  pravastatin (PRAVACHOL) 20 MG tablet TAKE 1 TABLET BY  MOUTH AT BEDTIME 08/24/15  Yes Burtis Junes, NP  telmisartan-hydrochlorothiazide (MICARDIS HCT) 80-25 MG tablet Take 1 tablet by mouth daily. 07/31/15  Yes Historical Provider, MD  amiodarone (PACERONE) 100 MG tablet Take 1 tablet (100 mg total) by mouth every morning. Patient not taking: Reported on 09/29/2015 10/28/14   Thompson Grayer, MD  cephALEXin (KEFLEX) 500 MG capsule Take 1 capsule (500 mg total) by mouth 2 (two) times daily. 09/29/15   Kian Gamarra, PA-C  oxymetazoline (AFRIN NASAL SPRAY) 0.05 % nasal spray Place 1 spray into both nostrils 2 (two) times daily as needed (nosebleed, no more than 3 days). 12/26/14   Leo Grosser, MD   BP 145/56 mmHg  Pulse 42  Temp(Src) 98 F (36.7 C) (Oral)  Resp 16  SpO2 92% Physical Exam  Constitutional: She is oriented to person, place, and time. She appears well-developed and well-nourished.  HENT:  Head: Normocephalic and atraumatic.  Mouth/Throat: Oropharynx is clear and moist.  Eyes: EOM are normal. Pupils are equal, round, and reactive to light.  Neck: Normal range of motion. Neck supple.  Cardiovascular: Normal rate, regular rhythm and normal heart sounds.   Pulmonary/Chest: Effort normal and breath sounds normal.  Abdominal: Soft. Bowel sounds are normal. She exhibits no distension and no mass. There is no tenderness. There is no rebound and no guarding.  Musculoskeletal: Normal range of motion.  Neurological: She is alert and oriented to person, place, and time. She has normal strength. No cranial nerve deficit or sensory deficit. Coordination and gait normal.  Skin: Skin is warm and dry.  Psychiatric: She has a normal mood and affect.  Nursing note and vitals reviewed.   ED Course  Procedures (including critical care time) Labs Review Labs Reviewed - No data to display  Imaging Review Dg Chest 2 View  09/29/2015  CLINICAL DATA:  Dizziness, decreased appetite starting Sunday EXAM: CHEST  2 VIEW COMPARISON:  07/06/2011 FINDINGS:  Cardiomediastinal silhouette is stable. No acute infiltrate or pleural effusion. No pulmonary edema. Mild degenerative changes mid and lower thoracic spine. IMPRESSION: No active cardiopulmonary disease. Electronically Signed   By: Lahoma Crocker M.D.   On: 09/29/2015 19:56   Ct Head Wo Contrast  09/29/2015  CLINICAL DATA:  Decreased appetite starting Sunday, dizziness EXAM: CT HEAD WITHOUT CONTRAST TECHNIQUE: Contiguous axial images were obtained from the base of the skull through the vertex without intravenous contrast. COMPARISON:  07/06/2011 FINDINGS: Brain: No intracranial hemorrhage, mass effect or midline shift. Stable mild cerebral atrophy. Mild periventricular white matter decreased attenuation probable due to chronic small vessel ischemic changes. No acute cortical infarction. No mass lesion is noted on this unenhanced scan. The gray and white-matter differentiation is preserved. Vascular: Atherosclerotic calcifications of carotid siphon are noted.  Skull: No skull fracture. Sinuses/Orbits: No paranasal sinuses air-fluid levels. The mastoid air cells are unremarkable. Other: None IMPRESSION: No acute intracranial abnormality. Mild cerebral atrophy. Mild periventricular white matter decreased attenuation probable due to chronic small vessel ischemic changes. No definite acute cortical infarction. Electronically Signed   By: Lahoma Crocker M.D.   On: 09/29/2015 19:24   I have personally reviewed and evaluated these images and lab results as part of my medical decision-making.   EKG Interpretation None     11:19 PM Patient ambulated in the ED with steady gait. MDM   Final diagnoses:  None   Patient presents today with a chief complaint of dizziness.  She has a normal neurological exam.  Dizziness resolved after given IVF in the ED.  CT head unremarkable.  Labs unremarkable, aside from leukocytosis.  CXR is negative.  UA does show a UTI.  Urine cultured.  Patient given IV Rocephin in the ED and  discharged home on Keflex.  Patient is afebrile and non toxic appearing.  Able to ambulate in the ED with steady gait.  Patient stable for discharge.  Family in agreement with the plan.  Patient also evaluated by Dr. Roderic Palau who is in agreement with the plan.  Return precautions given.   Hyman Bible, PA-C 09/30/15 [id number]  Milton Ferguson, MD 09/30/15 [phone]

## 2015-09-29 NOTE — ED Notes (Signed)
Pt reports dizziness and decreased appetite since Sunday. No N/v. Denies any pain or sob.

## 2015-09-29 NOTE — ED Notes (Signed)
Pt was able to ambulate in the hallway with assistance, pt has a steady gait.

## 2015-10-01 LAB — URINE CULTURE

## 2015-10-11 ENCOUNTER — Encounter (HOSPITAL_COMMUNITY)
Admission: RE | Admit: 2015-10-11 | Discharge: 2015-10-11 | Disposition: A | Discharge status: Home / Self Care | Payer: Medicare Other | Source: Ambulatory Visit | Attending: Internal Medicine | Admitting: Internal Medicine

## 2015-10-11 DIAGNOSIS — D649 Anemia, unspecified: Secondary | ICD-10-CM | POA: Insufficient documentation

## 2015-10-11 LAB — POCT HEMOGLOBIN-HEMACUE: Hemoglobin: 9.5 g/dL — ABNORMAL LOW (ref 12.0–15.0)

## 2015-10-11 MED ORDER — EPOETIN ALFA 20000 UNIT/ML IJ SOLN
INTRAMUSCULAR | Status: AC
  Administered 2015-10-11: 20000 [IU] via SUBCUTANEOUS
  Filled 2015-10-11: qty 1

## 2015-10-11 MED ORDER — EPOETIN ALFA 10000 UNIT/ML IJ SOLN: 30000.0000 [IU] | INTRAMUSCULAR | Status: DC

## 2015-10-11 MED ORDER — EPOETIN ALFA 10000 UNIT/ML IJ SOLN
INTRAMUSCULAR | Status: AC
  Administered 2015-10-11: 10000 [IU] via SUBCUTANEOUS
  Filled 2015-10-11: qty 1

## 2015-10-12 ENCOUNTER — Other Ambulatory Visit: Payer: Medicare Other

## 2015-10-19 ENCOUNTER — Telehealth: Payer: Self-pay | Admitting: *Deleted

## 2015-10-19 DIAGNOSIS — H353231 Exudative age-related macular degeneration, bilateral, with active choroidal neovascularization: Secondary | ICD-10-CM | POA: Diagnosis not present

## 2015-10-19 DIAGNOSIS — H43813 Vitreous degeneration, bilateral: Secondary | ICD-10-CM | POA: Diagnosis not present

## 2015-10-19 DIAGNOSIS — E113293 Type 2 diabetes mellitus with mild nonproliferative diabetic retinopathy without macular edema, bilateral: Secondary | ICD-10-CM | POA: Diagnosis not present

## 2015-10-19 NOTE — Telephone Encounter (Signed)
Pt is aware of new appointment time and date per providers request.

## 2015-10-26 ENCOUNTER — Ambulatory Visit: Payer: No Typology Code available for payment source | Admitting: Nurse Practitioner

## 2015-10-28 ENCOUNTER — Other Ambulatory Visit: Payer: Self-pay | Admitting: Internal Medicine

## 2015-10-30 ENCOUNTER — Other Ambulatory Visit: Payer: Self-pay | Admitting: Internal Medicine

## 2015-11-01 ENCOUNTER — Ambulatory Visit (INDEPENDENT_AMBULATORY_CARE_PROVIDER_SITE_OTHER): Payer: Medicare Other | Admitting: Nurse Practitioner

## 2015-11-01 ENCOUNTER — Encounter: Payer: Self-pay | Admitting: Nurse Practitioner

## 2015-11-01 VITALS — BP 126/70 | HR 48 | Ht 62.0 in | Wt 175.6 lb

## 2015-11-01 DIAGNOSIS — I519 Heart disease, unspecified: Secondary | ICD-10-CM | POA: Diagnosis not present

## 2015-11-01 DIAGNOSIS — E785 Hyperlipidemia, unspecified: Secondary | ICD-10-CM | POA: Diagnosis not present

## 2015-11-01 DIAGNOSIS — I472 Ventricular tachycardia, unspecified: Secondary | ICD-10-CM

## 2015-11-01 DIAGNOSIS — Z79899 Other long term (current) drug therapy: Secondary | ICD-10-CM

## 2015-11-01 DIAGNOSIS — I10 Essential (primary) hypertension: Secondary | ICD-10-CM

## 2015-11-01 DIAGNOSIS — E032 Hypothyroidism due to medicaments and other exogenous substances: Secondary | ICD-10-CM

## 2015-11-01 DIAGNOSIS — E038 Other specified hypothyroidism: Secondary | ICD-10-CM | POA: Diagnosis not present

## 2015-11-01 LAB — BASIC METABOLIC PANEL
BUN: 35 mg/dL — ABNORMAL HIGH (ref 7–25)
CO2: 16 mmol/L — ABNORMAL LOW (ref 20–31)
Calcium: 9 mg/dL (ref 8.6–10.4)
Chloride: 111 mmol/L — ABNORMAL HIGH (ref 98–110)
Creat: 1.9 mg/dL — ABNORMAL HIGH (ref 0.60–0.88)
Glucose, Bld: 164 mg/dL — ABNORMAL HIGH (ref 65–99)
Potassium: 5.3 mmol/L (ref 3.5–5.3)
Sodium: 138 mmol/L (ref 135–146)

## 2015-11-01 LAB — LIPID PANEL
Cholesterol: 104 mg/dL — ABNORMAL LOW (ref 125–200)
HDL: 23 mg/dL — ABNORMAL LOW (ref >=46)
LDL Cholesterol: 47 mg/dL (ref <130)
Total CHOL/HDL Ratio: 4.5 Ratio (ref <=5.0)
Triglycerides: 171 mg/dL — ABNORMAL HIGH (ref <150)
VLDL: 34 mg/dL — ABNORMAL HIGH (ref <30)

## 2015-11-01 LAB — HEPATIC FUNCTION PANEL
ALT: 10 U/L (ref 6–29)
AST: 19 U/L (ref 10–35)
Albumin: 3.7 g/dL (ref 3.6–5.1)
Alkaline Phosphatase: 119 U/L (ref 33–130)
Bilirubin, Direct: 0.1 mg/dL (ref <=0.2)
Indirect Bilirubin: 0.2 mg/dL (ref 0.2–1.2)
Total Bilirubin: 0.3 mg/dL (ref 0.2–1.2)
Total Protein: 6.3 g/dL (ref 6.1–8.1)

## 2015-11-01 LAB — CBC
HCT: 30.5 % — ABNORMAL LOW (ref 35.0–45.0)
Hemoglobin: 9.7 g/dL — ABNORMAL LOW (ref 11.7–15.5)
MCH: 28.7 pg (ref 27.0–33.0)
MCHC: 31.8 g/dL — ABNORMAL LOW (ref 32.0–36.0)
MCV: 90.2 fL (ref 80.0–100.0)
Platelets: 110 10*3/uL — ABNORMAL LOW (ref 140–400)
RBC: 3.38 MIL/uL — ABNORMAL LOW (ref 3.80–5.10)
RDW: 18.7 % — ABNORMAL HIGH (ref 11.0–15.0)
WBC: 7 10*3/uL (ref 3.8–10.8)

## 2015-11-01 LAB — TSH: TSH: 8.12 mIU/L — ABNORMAL HIGH

## 2015-11-01 MED ORDER — AMIODARONE HCL 100 MG PO TABS: 100.0000 mg | ORAL_TABLET | Freq: Every morning | ORAL | Status: DC

## 2015-11-01 NOTE — Progress Notes (Signed)
CARDIOLOGY OFFICE NOTE  Date:  11/01/2015    Leslie Coleman Date of Birth: 15-Jun-1928 Medical Record [id number]  PCP:  [name], MD  Cardiologist:  Allred    Chief Complaint  Patient presents with  . Irregular Heart Beat  . Hypertension  . Hyperlipidemia    Follow up visit - seen for Dr. Rayann Heman    History of Present Illness: Leslie Coleman is a 82 y.o. female who presents today for a follow up visit. Seen for Dr. Rayann Heman - former patient of Dr. Susa Simmonds.   She has a remote history of VT - treated initially with procainamide but switched to amiodarone back in 2008 due to unavailability of the medicine. Does have chronically abnormal PFTs. Other issues include HTN, gout, obesity, DM and anemia. On chronic Procrit.   I saw her back in September & October of 2015 - seemed to be getting feeble. Had a cold. Chronic DOE noted. More swelling. Weight was up. I added Lasix for a few days and she ended up taking just prn. Cut her atenolol back due to bradycardia and updated her labs and echo. TSH was elevated and we increased her thyroid medicine. She saw Dr. Rayann Heman in June of 2016 and was felt to be stable.   I last saw her back in December - balance was poor. Seemed to be getting more feeble and more forgetful as well. Her cardiac status was felt to be stable.   She was in the ER last month - was dizzy. Looks like she was treated with IVF and antibiotics. Her HR was 42 by EKG - does not look like this was addressed upon my review.   Comes in today. Here with her daughter today. She usually comes alone. She notes that overall she is doing ok. Not dizzy or lightheaded - she was when she went to the ER. Had a WBC of 20K. No chest pain. Not short of breath. Using a walker/cane now. Family now coming with her to her visits.    Past Medical History  Diagnosis Date  . [facility])     remote VT, treated initially with procainamide, on amiodarone since 2008  . HTN (hypertension)      Last echo in 2004; EF normal  . Hyperlipidemia   . Diabetes mellitus   . Obesity   . Hypothyroidism     on replacement  . Anemia     treated with Procrit  . Gout   . Abnormal PFTs (pulmonary function tests) 2008    No obstruction noted. Has been followed clinically.  . High risk medication use     on amiodarone.    Past Surgical History  Procedure Laterality Date  . Cardioversion  1978  . Cesarean section      x 3  . Abdominal hysterectomy    . US echocardiography  12/29/2002    EF 60-65%  . Cardiovascular stress test  07/07/2003    EF 70%. NO EVIDENCE OF ISCHEMIA     Medications: Current Outpatient Prescriptions  Medication Sig Dispense Refill  . acetaminophen (TYLENOL) 500 MG tablet Take 500 mg by mouth at bedtime.     Marland Kitchen allopurinol (ZYLOPRIM) 300 MG tablet TAKE 1 TABLET BY MOUTH EVERY DAY 90 tablet 1  . amiodarone (PACERONE) 100 MG tablet TAKE 1 TABLET BY MOUTH EVERY MORNING 90 tablet 0  . amLODipine (NORVASC) 10 MG tablet Take 10 mg by mouth at bedtime.    Marland Kitchen atenolol (TENORMIN) 100  MG tablet TAKE 1/2 TABLET BY MOUTH TWICE A DAY 90 tablet 1  . Epoetin Alfa (PROCRIT IJ) Inject 10,000 Units as directed every 28 (twenty-eight) days. As directed every 4 weeks.    Marland Kitchen glipiZIDE (GLUCOTROL XL) 5 MG 24 hr tablet Take 5 mg by mouth daily.  4  . hydrALAZINE (APRESOLINE) 50 MG tablet TAKE 1 TABLET BY MOUTH TWICE A DAY 180 tablet 2  . levothyroxine (SYNTHROID, LEVOTHROID) 88 MCG tablet Take 1 tablet (88 mcg total) by mouth daily before breakfast. 30 tablet 11  . omega-3 acid ethyl esters (LOVAZA) 1 g capsule TAKE 2 CAPSULES BY MOUTH 2 TIMES DAILY. 360 capsule 3  . oxymetazoline (AFRIN NASAL SPRAY) 0.05 % nasal spray Place 1 spray into both nostrils 2 (two) times daily as needed (nosebleed, no more than 3 days). 30 mL 0  . potassium chloride (K-DUR) 10 MEQ tablet Take 1 tablet (10 mEq total) by mouth every morning. 30 tablet 4  . pravastatin (PRAVACHOL) 20 MG tablet TAKE 1 TABLET BY  MOUTH AT BEDTIME 30 tablet 9  . telmisartan-hydrochlorothiazide (MICARDIS HCT) 80-25 MG tablet Take 1 tablet by mouth daily.  3   No current facility-administered medications for this visit.    Allergies: No Known Allergies  Social History: The patient  reports that she has never smoked. She has never used smokeless tobacco. She reports that she does not drink alcohol or use illicit drugs.   Family History: The patient's family history includes Heart disease in her father and mother.   Review of Systems: Please see the history of present illness.   Otherwise, the review of systems is positive for none.   All other systems are reviewed and negative.   Physical Exam: VS:  BP 126/70 mmHg  Pulse 48  Ht 5\' 2"  (1.575 m)  Wt 175 lb 9.6 oz (79.652 kg)  BMI 32.11 kg/m2 .  BMI Body mass index is 32.11 kg/(m^2).  Wt Readings from Last 3 Encounters:  11/01/15 175 lb 9.6 oz (79.652 kg)  05/11/15 176 lb 1.9 oz (79.888 kg)  01/03/15 180 lb (81.647 kg)    General: Elderly female who is alert and in no acute distress. She looks chronically ill.  HEENT: Normal. Neck: Supple, no JVD, carotid bruits, or masses noted.  Cardiac: Regular rate and rhythm - but slow. No murmurs, rubs, or gallops. No edema.  Respiratory:  Lungs are clear to auscultation bilaterally with normal work of breathing.  GI: Soft and nontender.  MS: No deformity or atrophy. Gait and ROM intact. Skin: Warm and dry. Color is pale and sallow (chronic) Neuro:  Strength and sensation are intact and no gross focal deficits noted.  Psych: Alert, appropriate and with normal affect.   LABORATORY DATA:  EKG:  EKG is ordered today. This shows marked bradycardia, 1st degree AV block. HR of 48.   Lab Results  Component Value Date   WBC 20.6* 09/29/2015   HGB 9.5* 10/11/2015   HCT 29.4* 09/29/2015   PLT 106* 09/29/2015   GLUCOSE 83 09/29/2015   CHOL 117* 05/11/2015   TRIG 168* 05/11/2015   HDL 23* 05/11/2015   LDLCALC 60  05/11/2015   ALT 16 09/29/2015   AST 27 09/29/2015   NA 140 09/29/2015   K 5.1 09/29/2015   CL 114* 09/29/2015   CREATININE 2.21* 09/29/2015   BUN 39* 09/29/2015   CO2 16* 09/29/2015   TSH 5.90* 08/16/2015   INR 1.11 06/18/2014   HGBA1C 6.2* 07/07/2011  BNP (last 3 results) No results for input(s): BNP in the last 8760 hours.  ProBNP (last 3 results) No results for input(s): PROBNP in the last 8760 hours.   Other Studies Reviewed Today:  Echo Study Conclusions from September 2015  - Left ventricle: The cavity size was normal. Wall thickness was normal. Systolic function was normal. The estimated ejection fraction was in the range of 60% to 65%. Left ventricular diastolic function parameters were normal. - Mitral valve: There was mild regurgitation. - Left atrium: The atrium was mildly dilated. - Right atrium: The atrium was mildly dilated.   Assessment / Plan: 1. History of VT - maintained on chronic low dose amiodarone therapy - now with marked bradycardia - stopping her Atenolol today. Needs repeat surveillance labs today.   2. Recent noted bradycardia - HR just 48 today with 1st degree AV block - stopping her Atenolol today.   3. HTN - BP ok on current regimen.   4. Anemia - on chronic Procrit   5. HLD - on statin therapy - rechecking lab today  6.  Mild LV dysfunction - most recent echo showing normal EF. She does not seem to be symptomatic.  7. Hypothyoidism - Rechecking TSH today.   8. History of abnormal PFTs - just followed clinically - fortunately, she has no shortness of breath at this time.   Current medicines are reviewed with the patient today.  The patient does not have concerns regarding medicines other than what has been noted above.  The following changes have been made:  See above.  Labs/ tests ordered today include:    Orders Placed This Encounter  Procedures  . Basic metabolic panel  . CBC  . Hepatic function panel  . Lipid panel   . TSH  . EKG 12-Lead     Disposition:   FU with me in 4 weeks with EKG.   Patient is agreeable to this plan and will call if any problems develop in the interim.   Signed: Burtis Junes, RN, ANP-C 11/01/2015 8:22 AM  [employer] [address] [place], [place]  [address] Phone: [phone] Fax: [phone]

## 2015-11-01 NOTE — Patient Instructions (Addendum)
We will be checking the following labs today - BMET, CBC, TSH, Lipids and HPF   Medication Instructions:    Continue with your current medicines. BUT  I am stopping Atenolol  I have refilled the amiodarone today.     Testing/Procedures To Be Arranged:  N/A  Follow-Up:   See me in one month with repeat EKG.     Other Special Instructions:   N/A    If you need a refill on your cardiac medications before your next appointment, please call your pharmacy.   Call the [place] office at [phone] if you have any questions, problems or concerns.

## 2015-11-02 ENCOUNTER — Other Ambulatory Visit: Payer: Self-pay | Admitting: Internal Medicine

## 2015-11-02 ENCOUNTER — Other Ambulatory Visit: Payer: Self-pay | Admitting: *Deleted

## 2015-11-02 MED ORDER — LEVOTHYROXINE SODIUM 100 MCG PO TABS: 100.0000 ug | ORAL_TABLET | Freq: Every day | ORAL | Status: DC

## 2015-11-03 ENCOUNTER — Other Ambulatory Visit: Payer: Self-pay | Admitting: Internal Medicine

## 2015-11-03 NOTE — Telephone Encounter (Signed)
Would you like to refill this medication. Please advise

## 2015-11-05 ENCOUNTER — Other Ambulatory Visit: Payer: Self-pay | Admitting: *Deleted

## 2015-11-05 NOTE — Telephone Encounter (Signed)
No, please obtain from PCP.  We do not treat gout

## 2015-11-08 ENCOUNTER — Encounter (HOSPITAL_COMMUNITY)
Admission: RE | Admit: 2015-11-08 | Discharge: 2015-11-08 | Disposition: A | Discharge status: Home / Self Care | Payer: Medicare Other | Source: Ambulatory Visit | Attending: Internal Medicine | Admitting: Internal Medicine

## 2015-11-08 ENCOUNTER — Other Ambulatory Visit: Payer: Self-pay | Admitting: Internal Medicine

## 2015-11-08 DIAGNOSIS — D649 Anemia, unspecified: Secondary | ICD-10-CM | POA: Diagnosis not present

## 2015-11-08 MED ORDER — EPOETIN ALFA 10000 UNIT/ML IJ SOLN: 30000.0000 [IU] | INTRAMUSCULAR | Status: DC

## 2015-11-08 MED ORDER — EPOETIN ALFA 20000 UNIT/ML IJ SOLN
INTRAMUSCULAR | Status: AC
  Administered 2015-11-08: 20000 [IU] via SUBCUTANEOUS
  Filled 2015-11-08: qty 1

## 2015-11-08 MED ORDER — EPOETIN ALFA 10000 UNIT/ML IJ SOLN
INTRAMUSCULAR | Status: AC
  Administered 2015-11-08: 10000 [IU] via SUBCUTANEOUS
  Filled 2015-11-08: qty 1

## 2015-11-09 LAB — POCT HEMOGLOBIN-HEMACUE: Hemoglobin: 9.8 g/dL — ABNORMAL LOW (ref 12.0–15.0)

## 2015-11-26 DIAGNOSIS — H353231 Exudative age-related macular degeneration, bilateral, with active choroidal neovascularization: Secondary | ICD-10-CM | POA: Diagnosis not present

## 2015-12-06 ENCOUNTER — Encounter (HOSPITAL_COMMUNITY)
Admission: RE | Admit: 2015-12-06 | Discharge: 2015-12-06 | Disposition: A | Discharge status: Home / Self Care | Payer: Medicare Other | Source: Ambulatory Visit | Attending: Internal Medicine | Admitting: Internal Medicine

## 2015-12-06 DIAGNOSIS — D649 Anemia, unspecified: Secondary | ICD-10-CM | POA: Insufficient documentation

## 2015-12-06 LAB — POCT HEMOGLOBIN-HEMACUE: Hemoglobin: 9.2 g/dL — ABNORMAL LOW (ref 12.0–15.0)

## 2015-12-06 MED ORDER — EPOETIN ALFA 10000 UNIT/ML IJ SOLN
INTRAMUSCULAR | Status: AC
  Administered 2015-12-06: 10000 [IU] via SUBCUTANEOUS
  Filled 2015-12-06: qty 1

## 2015-12-06 MED ORDER — EPOETIN ALFA 20000 UNIT/ML IJ SOLN
INTRAMUSCULAR | Status: AC
  Administered 2015-12-06: 20000 [IU] via SUBCUTANEOUS
  Filled 2015-12-06: qty 1

## 2015-12-06 MED ORDER — EPOETIN ALFA 10000 UNIT/ML IJ SOLN: 30000.0000 [IU] | INTRAMUSCULAR | Status: DC

## 2015-12-08 ENCOUNTER — Encounter: Payer: Self-pay | Admitting: Nurse Practitioner

## 2015-12-08 ENCOUNTER — Ambulatory Visit (INDEPENDENT_AMBULATORY_CARE_PROVIDER_SITE_OTHER): Payer: Medicare Other | Admitting: Nurse Practitioner

## 2015-12-08 VITALS — BP 118/60 | HR 90 | Ht 62.0 in | Wt 170.0 lb

## 2015-12-08 DIAGNOSIS — E032 Hypothyroidism due to medicaments and other exogenous substances: Secondary | ICD-10-CM | POA: Diagnosis not present

## 2015-12-08 DIAGNOSIS — E785 Hyperlipidemia, unspecified: Secondary | ICD-10-CM

## 2015-12-08 DIAGNOSIS — Z9289 Personal history of other medical treatment: Secondary | ICD-10-CM

## 2015-12-08 DIAGNOSIS — I10 Essential (primary) hypertension: Secondary | ICD-10-CM

## 2015-12-08 DIAGNOSIS — I472 Ventricular tachycardia, unspecified: Secondary | ICD-10-CM

## 2015-12-08 DIAGNOSIS — Z79899 Other long term (current) drug therapy: Secondary | ICD-10-CM | POA: Diagnosis not present

## 2015-12-08 DIAGNOSIS — R001 Bradycardia, unspecified: Secondary | ICD-10-CM

## 2015-12-08 DIAGNOSIS — I4729 Other ventricular tachycardia: Secondary | ICD-10-CM

## 2015-12-08 DIAGNOSIS — E038 Other specified hypothyroidism: Secondary | ICD-10-CM

## 2015-12-08 NOTE — Patient Instructions (Addendum)
We will be checking the following labs today - TSH  Medication Instructions:    Continue with your current medicines.     Testing/Procedures To Be Arranged:  N/A  Follow-Up:   See me in 4 months    Other Special Instructions:   N/A    If you need a refill on your cardiac medications before your next appointment, please call your pharmacy.   Call the [place] office at [phone] if you have any questions, problems or concerns.

## 2015-12-08 NOTE — Progress Notes (Signed)
CARDIOLOGY OFFICE NOTE  Date:  12/08/2015    [name] Date of Birth: 09/01/1928 Medical Record [id number]  PCP:  [name], MD  Cardiologist:  Servando Snare [facility]    Chief Complaint  Patient presents with  . Irregular Heart Beat  . Hypertension  . Hyperlipidemia  . Congestive Heart Failure    1 month check after stopping beta blocker - seen for Dr. Rayann Heman    History of Present Illness: Leslie Coleman is a 82 y.o. female who presents today for a one month check. Seen for Dr. Rayann Heman - former patient of Dr. Susa Simmonds.   She has a remote history of VT - treated initially with procainamide but switched to amiodarone back in 2008 due to unavailability of the medicine. Does have chronically abnormal PFTs. Other issues include HTN, gout, obesity, DM and anemia. On chronic Procrit.   I saw her back in September & October of 2015 - seemed to be getting feeble. Had a cold. Chronic DOE noted. More swelling. Weight was up. I added Lasix for a few days and she ended up taking just prn. Cut her atenolol back due to bradycardia and updated her labs and echo. TSH was elevated and we increased her thyroid medicine. She saw Dr. Rayann Heman in June of 2016 and was felt to be stable.   I last saw her back last December - balance was poor. Seemed to be getting more feeble and more forgetful as well. Her cardiac status was felt to be stable.   She was in the ER back in May - was dizzy. Looks like she was treated with IVF and antibiotics. Her HR was 42 by EKG - does not look like this was addressed upon my review. I saw her last month for her regular check - she remained bradycardic - she was asymptomatic - I stopped her Atenolol.   Comes in today. Here with her daughter [name] today. Has had a good month. May feel a little stronger. No dizzy spells. Family is happy with how she is doing. No chest pain. Breathing is ok. No falls.   Past Medical History  Diagnosis Date  . V-tach [facility])    remote VT, treated initially with procainamide, on amiodarone since 2008  . HTN (hypertension)     Last echo in 2004; EF normal  . Hyperlipidemia   . Diabetes mellitus   . Obesity   . Hypothyroidism     on replacement  . Anemia     treated with Procrit  . Gout   . Abnormal PFTs (pulmonary function tests) 2008    No obstruction noted. Has been followed clinically.  . High risk medication use     on amiodarone.    Past Surgical History  Procedure Laterality Date  . Cardioversion  1978  . Cesarean section      x 3  . Abdominal hysterectomy    . US echocardiography  12/29/2002    EF 60-65%  . Cardiovascular stress test  07/07/2003    EF 70%. NO EVIDENCE OF ISCHEMIA     Medications: Current Outpatient Prescriptions  Medication Sig Dispense Refill  . acetaminophen (TYLENOL) 500 MG tablet Take 500 mg by mouth at bedtime.     Marland Kitchen allopurinol (ZYLOPRIM) 300 MG tablet TAKE 1 TABLET BY MOUTH EVERY DAY 90 tablet 1  . amiodarone (PACERONE) 100 MG tablet Take 1 tablet (100 mg total) by mouth every morning. 90 tablet 3  . amLODipine (NORVASC) 10 MG  tablet Take 10 mg by mouth at bedtime.    Marland Kitchen Epoetin Alfa (PROCRIT IJ) Inject 10,000 Units as directed every 28 (twenty-eight) days. As directed every 4 weeks.    Marland Kitchen glipiZIDE (GLUCOTROL XL) 5 MG 24 hr tablet Take 5 mg by mouth daily.  4  . hydrALAZINE (APRESOLINE) 50 MG tablet TAKE 1 TABLET BY MOUTH TWICE A DAY 180 tablet 2  . levothyroxine (SYNTHROID, LEVOTHROID) 100 MCG tablet Take 1 tablet (100 mcg total) by mouth daily before breakfast. 30 tablet 9  . omega-3 acid ethyl esters (LOVAZA) 1 g capsule TAKE 2 CAPSULES BY MOUTH 2 TIMES DAILY. 360 capsule 3  . oxymetazoline (AFRIN NASAL SPRAY) 0.05 % nasal spray Place 1 spray into both nostrils 2 (two) times daily as needed (nosebleed, no more than 3 days). 30 mL 0  . potassium chloride (K-DUR) 10 MEQ tablet Take 1 tablet (10 mEq total) by mouth every morning. 30 tablet 4  . pravastatin (PRAVACHOL) 20  MG tablet TAKE 1 TABLET BY MOUTH AT BEDTIME 30 tablet 9  . telmisartan-hydrochlorothiazide (MICARDIS HCT) 80-25 MG tablet Take 1 tablet by mouth daily.  3   No current facility-administered medications for this visit.    Allergies: No Known Allergies  Social History: The patient  reports that she has never smoked. She has never used smokeless tobacco. She reports that she does not drink alcohol or use illicit drugs.   Family History: The patient's family history includes Heart disease in her father and mother.   Review of Systems: Please see the history of present illness.   Otherwise, the review of systems is positive for none.   All other systems are reviewed and negative.   Physical Exam: VS:  BP 118/60 mmHg  Pulse 90  Ht 5\' 2"  (1.575 m)  Wt 170 lb (77.111 kg)  BMI 31.09 kg/m2 .  BMI Body mass index is 31.09 kg/(m^2).  Wt Readings from Last 3 Encounters:  12/08/15 170 lb (77.111 kg)  11/01/15 175 lb 9.6 oz (79.652 kg)  05/11/15 176 lb 1.9 oz (79.888 kg)    General: Pleasant. Chronically ill but alert and in no acute distress.  HEENT: Normal. Neck: Supple, no JVD, carotid bruits, or masses noted.  Cardiac: Regular rate and rhythm. No murmurs, rubs, or gallops. No edema.  Respiratory:  Lungs are clear to auscultation bilaterally with normal work of breathing.  GI: Soft and nontender.  MS: No deformity or atrophy. Gait and ROM intact. She is using her cane.  Skin: Warm and dry. Color is chronically sallow Neuro:  Strength and sensation are intact and no gross focal deficits noted.  Psych: Alert, appropriate and with normal affect.   LABORATORY DATA:  EKG:  EKG is ordered today. This demonstrates NSR with a rate of 92 with 1st degree AV block.   Lab Results  Component Value Date   WBC 7.0 11/01/2015   HGB 9.2* 12/06/2015   HCT 30.5* 11/01/2015   PLT 110* 11/01/2015   GLUCOSE 164* 11/01/2015   CHOL 104* 11/01/2015   TRIG 171* 11/01/2015   HDL 23* 11/01/2015    LDLCALC 47 11/01/2015   ALT 10 11/01/2015   AST 19 11/01/2015   NA 138 11/01/2015   K 5.3 11/01/2015   CL 111* 11/01/2015   CREATININE 1.90* 11/01/2015   BUN 35* 11/01/2015   CO2 16* 11/01/2015   TSH 8.12* 11/01/2015   INR 1.11 06/18/2014   HGBA1C 6.2* 07/07/2011    BNP (last 3 results)  No results for input(s): BNP in the last 8760 hours.  ProBNP (last 3 results) No results for input(s): PROBNP in the last 8760 hours.   Other Studies Reviewed Today:  Echo Study Conclusions from September 2015  - Left ventricle: The cavity size was normal. Wall thickness was normal. Systolic function was normal. The estimated ejection fraction was in the range of 60% to 65%. Left ventricular diastolic function parameters were normal. - Mitral valve: There was mild regurgitation. - Left atrium: The atrium was mildly dilated. - Right atrium: The atrium was mildly dilated.   Assessment / Plan: 1. History of VT - maintained on chronic low dose amiodarone therapy - was quite  Bradycardic at last visit - stopped her Atenolol - recheck EKG today better. She is asymptomatic.   2. Recent noted bradycardia - HR just 48 today with 1st degree AV block - no longer on her Atenolol today.   3. HTN - BP ok on current regimen.   4. Anemia - on chronic Procrit   5. HLD - on statin therapy with recent labs done  6. Mild LV dysfunction - most recent echo showing normal EF. She does not seem to be symptomatic.  7. Hypothyoidism - Rechecking TSH again today. Her dose was increased a month ago.  8. History of abnormal PFTs - just followed clinically - fortunately, she has no shortness of breath at this time.   Current medicines are reviewed with the patient today.  The patient does not have concerns regarding medicines other than what has been noted above.  The following changes have been made:  See above.  Labs/ tests ordered today include:   No orders of the defined types were placed in this  encounter.     Disposition:   FU with me in 4 months.   Patient is agreeable to this plan and will call if any problems develop in the interim.   Signed: Burtis Junes, RN, ANP-C 12/08/2015 9:17 AM  [facility] [address] [place],   [address] Phone: [phone] Fax: [phone]

## 2015-12-09 LAB — TSH: TSH: 0.68 mIU/L

## 2015-12-16 ENCOUNTER — Other Ambulatory Visit: Payer: Self-pay | Admitting: Internal Medicine

## 2015-12-16 DIAGNOSIS — N189 Chronic kidney disease, unspecified: Principal | ICD-10-CM

## 2015-12-16 DIAGNOSIS — D631 Anemia in chronic kidney disease: Secondary | ICD-10-CM

## 2015-12-23 ENCOUNTER — Other Ambulatory Visit: Payer: Self-pay | Admitting: Internal Medicine

## 2015-12-23 NOTE — Telephone Encounter (Signed)
Needs to obtain from MD that follows her for gout

## 2015-12-23 NOTE — Telephone Encounter (Signed)
Would you like to refill this medication? Please advise

## 2015-12-29 DIAGNOSIS — H353231 Exudative age-related macular degeneration, bilateral, with active choroidal neovascularization: Secondary | ICD-10-CM | POA: Diagnosis not present

## 2015-12-29 DIAGNOSIS — E113293 Type 2 diabetes mellitus with mild nonproliferative diabetic retinopathy without macular edema, bilateral: Secondary | ICD-10-CM | POA: Diagnosis not present

## 2015-12-29 DIAGNOSIS — H43813 Vitreous degeneration, bilateral: Secondary | ICD-10-CM | POA: Diagnosis not present

## 2016-01-03 ENCOUNTER — Encounter (HOSPITAL_COMMUNITY)
Admission: RE | Admit: 2016-01-03 | Discharge: 2016-01-03 | Disposition: A | Discharge status: Home / Self Care | Payer: Medicare Other | Source: Ambulatory Visit | Attending: Internal Medicine | Admitting: Internal Medicine

## 2016-01-03 DIAGNOSIS — D649 Anemia, unspecified: Secondary | ICD-10-CM | POA: Diagnosis not present

## 2016-01-03 DIAGNOSIS — N189 Chronic kidney disease, unspecified: Secondary | ICD-10-CM

## 2016-01-03 DIAGNOSIS — D631 Anemia in chronic kidney disease: Secondary | ICD-10-CM

## 2016-01-03 LAB — POCT HEMOGLOBIN-HEMACUE: Hemoglobin: 9 g/dL — ABNORMAL LOW (ref 12.0–15.0)

## 2016-01-03 MED ORDER — EPOETIN ALFA 20000 UNIT/ML IJ SOLN
INTRAMUSCULAR | Status: AC
  Administered 2016-01-03: 20000 [IU] via SUBCUTANEOUS
  Filled 2016-01-03: qty 1

## 2016-01-03 MED ORDER — EPOETIN ALFA 10000 UNIT/ML IJ SOLN: 30000.0000 [IU] | INTRAMUSCULAR | Status: DC

## 2016-01-03 MED ORDER — EPOETIN ALFA 10000 UNIT/ML IJ SOLN
INTRAMUSCULAR | Status: AC
  Administered 2016-01-03: 10000 [IU] via SUBCUTANEOUS
  Filled 2016-01-03: qty 1

## 2016-01-22 ENCOUNTER — Other Ambulatory Visit: Payer: Self-pay | Admitting: Internal Medicine

## 2016-01-22 DIAGNOSIS — Z8639 Personal history of other endocrine, nutritional and metabolic disease: Secondary | ICD-10-CM

## 2016-01-31 ENCOUNTER — Encounter (HOSPITAL_COMMUNITY)
Admission: RE | Admit: 2016-01-31 | Discharge: 2016-01-31 | Disposition: A | Discharge status: Home / Self Care | Payer: Medicare Other | Source: Ambulatory Visit | Attending: Internal Medicine | Admitting: Internal Medicine

## 2016-01-31 DIAGNOSIS — N189 Chronic kidney disease, unspecified: Secondary | ICD-10-CM

## 2016-01-31 DIAGNOSIS — D649 Anemia, unspecified: Secondary | ICD-10-CM | POA: Insufficient documentation

## 2016-01-31 DIAGNOSIS — D631 Anemia in chronic kidney disease: Secondary | ICD-10-CM

## 2016-01-31 LAB — POCT HEMOGLOBIN-HEMACUE: HEMOGLOBIN: 9.9 g/dL — AB (ref 12.0–15.0)

## 2016-01-31 MED ORDER — EPOETIN ALFA 20000 UNIT/ML IJ SOLN
INTRAMUSCULAR | Status: AC
  Administered 2016-01-31: 20000 [IU]
  Filled 2016-01-31: qty 1

## 2016-01-31 MED ORDER — EPOETIN ALFA 10000 UNIT/ML IJ SOLN
INTRAMUSCULAR | Status: AC
  Filled 2016-01-31: qty 1

## 2016-01-31 MED ORDER — EPOETIN ALFA 10000 UNIT/ML IJ SOLN
30000.0000 [IU] | INTRAMUSCULAR | Status: DC
  Administered 2016-01-31: 10000 [IU] via SUBCUTANEOUS

## 2016-02-08 DIAGNOSIS — Z23 Encounter for immunization: Secondary | ICD-10-CM | POA: Diagnosis not present

## 2016-02-09 DIAGNOSIS — E113293 Type 2 diabetes mellitus with mild nonproliferative diabetic retinopathy without macular edema, bilateral: Secondary | ICD-10-CM | POA: Diagnosis not present

## 2016-02-09 DIAGNOSIS — H43813 Vitreous degeneration, bilateral: Secondary | ICD-10-CM | POA: Diagnosis not present

## 2016-02-09 DIAGNOSIS — H353231 Exudative age-related macular degeneration, bilateral, with active choroidal neovascularization: Secondary | ICD-10-CM | POA: Diagnosis not present

## 2016-02-23 ENCOUNTER — Other Ambulatory Visit: Payer: Self-pay | Admitting: *Deleted

## 2016-02-23 DIAGNOSIS — Z8639 Personal history of other endocrine, nutritional and metabolic disease: Secondary | ICD-10-CM

## 2016-02-23 MED ORDER — POTASSIUM CHLORIDE ER 10 MEQ PO TBCR: 10.0000 meq | EXTENDED_RELEASE_TABLET | Freq: Every morning | ORAL | 3 refills | Status: DC

## 2016-02-25 ENCOUNTER — Other Ambulatory Visit: Payer: Self-pay | Admitting: Nurse Practitioner

## 2016-02-25 DIAGNOSIS — Z8639 Personal history of other endocrine, nutritional and metabolic disease: Secondary | ICD-10-CM

## 2016-02-25 MED ORDER — POTASSIUM CHLORIDE ER 10 MEQ PO TBCR: 10.0000 meq | EXTENDED_RELEASE_TABLET | Freq: Every morning | ORAL | 2 refills | Status: DC

## 2016-02-28 ENCOUNTER — Encounter (HOSPITAL_COMMUNITY): Payer: Self-pay

## 2016-02-28 ENCOUNTER — Encounter (HOSPITAL_COMMUNITY): Payer: Medicare Other

## 2016-02-28 DIAGNOSIS — D649 Anemia, unspecified: Secondary | ICD-10-CM | POA: Diagnosis not present

## 2016-02-28 DIAGNOSIS — I10 Essential (primary) hypertension: Secondary | ICD-10-CM | POA: Diagnosis not present

## 2016-02-28 DIAGNOSIS — E782 Mixed hyperlipidemia: Secondary | ICD-10-CM | POA: Diagnosis not present

## 2016-02-28 DIAGNOSIS — E118 Type 2 diabetes mellitus with unspecified complications: Secondary | ICD-10-CM | POA: Diagnosis not present

## 2016-03-06 DIAGNOSIS — N183 Chronic kidney disease, stage 3 (moderate): Secondary | ICD-10-CM | POA: Diagnosis not present

## 2016-03-06 DIAGNOSIS — I10 Essential (primary) hypertension: Secondary | ICD-10-CM | POA: Diagnosis not present

## 2016-03-06 DIAGNOSIS — E782 Mixed hyperlipidemia: Secondary | ICD-10-CM | POA: Diagnosis not present

## 2016-03-06 DIAGNOSIS — E118 Type 2 diabetes mellitus with unspecified complications: Secondary | ICD-10-CM | POA: Diagnosis not present

## 2016-03-07 ENCOUNTER — Encounter (HOSPITAL_COMMUNITY)
Admission: RE | Admit: 2016-03-07 | Discharge: 2016-03-07 | Disposition: A | Discharge status: Home / Self Care | Payer: Medicare Other | Source: Ambulatory Visit | Attending: Internal Medicine | Admitting: Internal Medicine

## 2016-03-07 DIAGNOSIS — D649 Anemia, unspecified: Secondary | ICD-10-CM | POA: Insufficient documentation

## 2016-03-07 DIAGNOSIS — N189 Chronic kidney disease, unspecified: Secondary | ICD-10-CM

## 2016-03-07 DIAGNOSIS — D631 Anemia in chronic kidney disease: Secondary | ICD-10-CM

## 2016-03-07 LAB — POCT HEMOGLOBIN-HEMACUE: HEMOGLOBIN: 9.9 g/dL — AB (ref 12.0–15.0)

## 2016-03-07 MED ORDER — EPOETIN ALFA 10000 UNIT/ML IJ SOLN
INTRAMUSCULAR | Status: AC
  Administered 2016-03-07: 10000 [IU] via SUBCUTANEOUS
  Filled 2016-03-07: qty 1

## 2016-03-07 MED ORDER — EPOETIN ALFA 20000 UNIT/ML IJ SOLN
INTRAMUSCULAR | Status: AC
  Administered 2016-03-07: 20000 [IU] via SUBCUTANEOUS
  Filled 2016-03-07: qty 1

## 2016-03-07 MED ORDER — EPOETIN ALFA 10000 UNIT/ML IJ SOLN: 30000.0000 [IU] | INTRAMUSCULAR | Status: DC

## 2016-03-21 ENCOUNTER — Encounter: Payer: Self-pay | Admitting: Nurse Practitioner

## 2016-03-22 DIAGNOSIS — E113293 Type 2 diabetes mellitus with mild nonproliferative diabetic retinopathy without macular edema, bilateral: Secondary | ICD-10-CM | POA: Diagnosis not present

## 2016-03-22 DIAGNOSIS — H353231 Exudative age-related macular degeneration, bilateral, with active choroidal neovascularization: Secondary | ICD-10-CM | POA: Diagnosis not present

## 2016-03-22 DIAGNOSIS — H43813 Vitreous degeneration, bilateral: Secondary | ICD-10-CM | POA: Diagnosis not present

## 2016-04-03 ENCOUNTER — Other Ambulatory Visit (HOSPITAL_COMMUNITY): Payer: Self-pay | Admitting: *Deleted

## 2016-04-04 ENCOUNTER — Encounter (HOSPITAL_COMMUNITY): Payer: No Typology Code available for payment source

## 2016-04-04 ENCOUNTER — Encounter: Payer: Self-pay | Admitting: Nurse Practitioner

## 2016-04-04 ENCOUNTER — Ambulatory Visit (INDEPENDENT_AMBULATORY_CARE_PROVIDER_SITE_OTHER): Payer: Medicare Other | Admitting: Nurse Practitioner

## 2016-04-04 ENCOUNTER — Encounter (HOSPITAL_COMMUNITY)
Admission: RE | Admit: 2016-04-04 | Discharge: 2016-04-04 | Disposition: A | Discharge status: Home / Self Care | Payer: Medicare Other | Source: Ambulatory Visit | Attending: Internal Medicine | Admitting: Internal Medicine

## 2016-04-04 VITALS — BP 130/68 | HR 89 | Ht 62.0 in | Wt 158.8 lb

## 2016-04-04 DIAGNOSIS — Z79899 Other long term (current) drug therapy: Secondary | ICD-10-CM

## 2016-04-04 DIAGNOSIS — I472 Ventricular tachycardia, unspecified: Secondary | ICD-10-CM

## 2016-04-04 DIAGNOSIS — D649 Anemia, unspecified: Secondary | ICD-10-CM | POA: Insufficient documentation

## 2016-04-04 DIAGNOSIS — I10 Essential (primary) hypertension: Secondary | ICD-10-CM | POA: Diagnosis not present

## 2016-04-04 DIAGNOSIS — N189 Chronic kidney disease, unspecified: Secondary | ICD-10-CM

## 2016-04-04 DIAGNOSIS — E7800 Pure hypercholesterolemia, unspecified: Secondary | ICD-10-CM | POA: Diagnosis not present

## 2016-04-04 DIAGNOSIS — R001 Bradycardia, unspecified: Secondary | ICD-10-CM

## 2016-04-04 DIAGNOSIS — D631 Anemia in chronic kidney disease: Secondary | ICD-10-CM

## 2016-04-04 LAB — BASIC METABOLIC PANEL
BUN: 28 mg/dL — ABNORMAL HIGH (ref 7–25)
CO2: 16 mmol/L — ABNORMAL LOW (ref 20–31)
Calcium: 9.2 mg/dL (ref 8.6–10.4)
Chloride: 111 mmol/L — ABNORMAL HIGH (ref 98–110)
Creat: 1.99 mg/dL — ABNORMAL HIGH (ref 0.60–0.88)
Glucose, Bld: 138 mg/dL — ABNORMAL HIGH (ref 65–99)
Potassium: 4.9 mmol/L (ref 3.5–5.3)
Sodium: 141 mmol/L (ref 135–146)

## 2016-04-04 LAB — HEPATIC FUNCTION PANEL
ALT: 10 U/L (ref 6–29)
AST: 20 U/L (ref 10–35)
Albumin: 3.6 g/dL (ref 3.6–5.1)
Alkaline Phosphatase: 94 U/L (ref 33–130)
Bilirubin, Direct: 0.1 mg/dL (ref <=0.2)
Indirect Bilirubin: 0.2 mg/dL (ref 0.2–1.2)
Total Bilirubin: 0.3 mg/dL (ref 0.2–1.2)
Total Protein: 6.7 g/dL (ref 6.1–8.1)

## 2016-04-04 LAB — LIPID PANEL
Cholesterol: 106 mg/dL (ref <200)
HDL: 21 mg/dL — ABNORMAL LOW (ref >50)
LDL Cholesterol: 43 mg/dL (ref <100)
Total CHOL/HDL Ratio: 5 Ratio — ABNORMAL HIGH (ref <5.0)
Triglycerides: 210 mg/dL — ABNORMAL HIGH (ref <150)
VLDL: 42 mg/dL — ABNORMAL HIGH (ref <30)

## 2016-04-04 LAB — CBC
HCT: 32.3 % — ABNORMAL LOW (ref 35.0–45.0)
Hemoglobin: 10.2 g/dL — ABNORMAL LOW (ref 11.7–15.5)
MCH: 28.7 pg (ref 27.0–33.0)
MCHC: 31.6 g/dL — ABNORMAL LOW (ref 32.0–36.0)
MCV: 90.7 fL (ref 80.0–100.0)
MPV: 9.4 fL (ref 7.5–12.5)
Platelets: 235 10*3/uL (ref 140–400)
RBC: 3.56 MIL/uL — ABNORMAL LOW (ref 3.80–5.10)
RDW: 15.8 % — ABNORMAL HIGH (ref 11.0–15.0)
WBC: 9 10*3/uL (ref 3.8–10.8)

## 2016-04-04 LAB — POCT HEMOGLOBIN-HEMACUE: Hemoglobin: 9.9 g/dL — ABNORMAL LOW (ref 12.0–15.0)

## 2016-04-04 LAB — TSH: TSH: 1.45 mIU/L

## 2016-04-04 MED ORDER — EPOETIN ALFA 20000 UNIT/ML IJ SOLN
INTRAMUSCULAR | Status: AC
  Administered 2016-04-05: 20000 [IU]
  Filled 2016-04-04: qty 1

## 2016-04-04 MED ORDER — EPOETIN ALFA 10000 UNIT/ML IJ SOLN
30000.0000 [IU] | INTRAMUSCULAR | Status: DC
Start: 2016-04-04 — End: 2016-04-05

## 2016-04-04 MED ORDER — EPOETIN ALFA 10000 UNIT/ML IJ SOLN
INTRAMUSCULAR | Status: AC
  Administered 2016-04-05: 10000 [IU]
  Filled 2016-04-04: qty 1

## 2016-04-04 NOTE — Progress Notes (Signed)
CARDIOLOGY OFFICE NOTE  Date:  04/04/2016    Leslie Coleman Date of Birth: 05-20-1929 Medical Record #[id number]  PCP:  Leslie Seashore, MD  Cardiologist:  Leslie Coleman & Leslie Coleman  Chief Complaint  Patient presents with  . Irregular Heart Beat    4 month check - seen for Dr. Rayann Coleman    History of Present Illness: Leslie Coleman is a 82 y.o. female who presents today for a 4 month check. Seen for Dr. Rayann Coleman - former patient of Dr. Susa Coleman.   She has a remote history of VT - treated initially with procainamide but switched to amiodarone back in 2008 due to unavailability of the medicine. Does have chronically abnormal PFTs. Other issues include HTN, gout, obesity, DM and anemia. On chronic Procrit.   I saw her back in September & October of 2015 - seemed to be getting feeble. Had a cold. Chronic DOE noted. More swelling. Weight was up. I added Lasix for a few days and she ended up taking just prn. Cut her atenolol back due to bradycardia and updated her labs and echo. TSH was elevated and we increased her thyroid medicine. She saw Dr. Rayann Coleman in June of 2016 and was felt to be stable.   She was in the ER back in May - was dizzy. Looks like she was treated with IVF and antibiotics. Her HR was 42 by EKG - this was not addressed upon my review. I saw her in June for her regular check - she remained bradycardic - she was asymptomatic - I stopped her Atenolol. Last visit with me was back in July and she was doing well. HR had improved.   Comes in today. Here with her daughter Leslie Coleman today. She says she is doing well. No chest pain. Not dizzy or lightheaded. Feels "good". She has lost a significant amount of weight in the past 5 months - says she is eating less. No fever or chills. No night sweats. Daughter says she feels like she is just eating less.   Past Medical History:  Diagnosis Date  . Abnormal PFTs (pulmonary function tests) 2008   No obstruction noted. Has been followed  clinically.  . Anemia    treated with Procrit  . Diabetes mellitus   . Gout   . High risk medication use    on amiodarone.  Marland Kitchen HTN (hypertension)    Last echo in 2004; EF normal  . Hyperlipidemia   . Hypothyroidism    on replacement  . Obesity   . V-tach [facility])    remote VT, treated initially with procainamide, on amiodarone since 2008    Past Surgical History:  Procedure Laterality Date  . ABDOMINAL HYSTERECTOMY    . CARDIOVASCULAR STRESS TEST  07/07/2003   EF 70%. NO EVIDENCE OF ISCHEMIA  . CARDIOVERSION  1978  . CESAREAN SECTION     x 3  . US ECHOCARDIOGRAPHY  12/29/2002   EF 60-65%     Medications: Current Outpatient Prescriptions  Medication Sig Dispense Refill  . acetaminophen (TYLENOL) 500 MG tablet Take 500 mg by mouth at bedtime.     Marland Kitchen allopurinol (ZYLOPRIM) 300 MG tablet TAKE 1 TABLET BY MOUTH EVERY DAY 90 tablet 1  . amiodarone (PACERONE) 100 MG tablet Take 1 tablet (100 mg total) by mouth every morning. 90 tablet 3  . amLODipine (NORVASC) 10 MG tablet Take 10 mg by mouth at bedtime.    Marland Kitchen Epoetin Alfa (PROCRIT IJ) Inject 10,000 Units as directed  every 28 (twenty-eight) days. As directed every 4 weeks.    Marland Kitchen glipiZIDE (GLUCOTROL XL) 5 MG 24 hr tablet Take 5 mg by mouth daily.  4  . hydrALAZINE (APRESOLINE) 50 MG tablet TAKE 1 TABLET BY MOUTH TWICE A DAY 180 tablet 2  . levothyroxine (SYNTHROID, LEVOTHROID) 100 MCG tablet Take 1 tablet (100 mcg total) by mouth daily before breakfast. 30 tablet 9  . omega-3 acid ethyl esters (LOVAZA) 1 g capsule TAKE 2 CAPSULES BY MOUTH 2 TIMES DAILY. 360 capsule 3  . oxymetazoline (AFRIN NASAL SPRAY) 0.05 % nasal spray Place 1 spray into both nostrils 2 (two) times daily as needed (nosebleed, no more than 3 days). 30 mL 0  . potassium chloride (KLOR-CON 10) 10 MEQ tablet Take 1 tablet (10 mEq total) by mouth every morning. 90 tablet 2  . pravastatin (PRAVACHOL) 20 MG tablet TAKE 1 TABLET BY MOUTH AT BEDTIME 30 tablet 9  .  telmisartan-hydrochlorothiazide (MICARDIS HCT) 80-25 MG tablet Take 1 tablet by mouth daily.  3   No current facility-administered medications for this visit.    Facility-Administered Medications Ordered in Other Visits  Medication Dose Route Frequency Provider Last Rate Last Dose  . epoetin alfa (EPOGEN,PROCRIT) 17510 UNIT/ML injection           . epoetin alfa (EPOGEN,PROCRIT) 25852 UNIT/ML injection           . epoetin alfa (EPOGEN,PROCRIT) injection 30,000 Units  30,000 Units Subcutaneous Q28 days Leslie Seashore, MD        Allergies: No Known Allergies  Social History: The patient  reports that she has never smoked. She has never used smokeless tobacco. She reports that she does not drink alcohol or use drugs.   Family History: The patient's family history includes Heart disease in her father and mother.   Review of Systems: Please see the history of present illness.   Otherwise, the review of systems is positive for none.   All other systems are reviewed and negative.   Physical Exam: VS:  BP 130/68   Pulse 89   Ht 5\' 2"  (1.575 m)   Wt 158 lb 12.8 oz (72 kg)   BMI 29.04 kg/m  .  BMI Body mass index is 29.04 kg/m.  Wt Readings from Last 3 Encounters:  04/04/16 158 lb 12.8 oz (72 kg)  12/08/15 170 lb (77.1 kg)  11/01/15 175 lb 9.6 oz (79.7 kg)    General: Pleasant. Elderly female. She is alert and in no acute distress. Her weight is down 12 pounds since her last visit here with me. She is down 17 pounds since June.  Color is sallow.  HEENT: Normal.  Neck: Supple, no JVD, carotid bruits, or masses noted.  Cardiac: Regular rate and rhythm. No murmurs, rubs, or gallops. No edema.  Respiratory:  Lungs are clear to auscultation bilaterally with normal work of breathing.  GI: Soft and nontender.  MS: No deformity or atrophy. Gait and ROM intact.  Skin: Warm and dry. Color is normal.  Neuro:  Strength and sensation are intact and no gross focal deficits noted.  Psych:  Alert, appropriate and with normal affect.   LABORATORY DATA:  EKG:  EKG is ordered today. This shows NSR.   Lab Results  Component Value Date   WBC 7.0 11/01/2015   HGB 9.9 (L) 03/07/2016   HCT 30.5 (L) 11/01/2015   PLT 110 (L) 11/01/2015   GLUCOSE 164 (H) 11/01/2015   CHOL 104 (L) 11/01/2015  TRIG 171 (H) 11/01/2015   HDL 23 (L) 11/01/2015   LDLCALC 47 11/01/2015   ALT 10 11/01/2015   AST 19 11/01/2015   NA 138 11/01/2015   K 5.3 11/01/2015   CL 111 (H) 11/01/2015   CREATININE 1.90 (H) 11/01/2015   BUN 35 (H) 11/01/2015   CO2 16 (L) 11/01/2015   TSH 0.68 12/08/2015   INR 1.11 06/18/2014   HGBA1C 6.2 (H) 07/07/2011    BNP (last 3 results) No results for input(s): BNP in the last 8760 hours.  ProBNP (last 3 results) No results for input(s): PROBNP in the last 8760 hours.   Other Studies Reviewed Today:  Echo Study Conclusions from September 2015  - Left ventricle: The cavity size was normal. Wall thickness was normal. Systolic function was normal. The estimated ejection fraction was in the range of 60% to 65%. Left ventricular diastolic function parameters were normal. - Mitral valve: There was mild regurgitation. - Left atrium: The atrium was mildly dilated. - Right atrium: The atrium was mildly dilated.   Assessment / Plan: 1. History of VT - maintained on chronic low dose amiodarone therapy - was quite  bradycardic at prior visit back in the summer - stopped her Atenolol - this has improved.   2. Asymptomatic bradycardia - resolved with cessation of beta blocker.   3. HTN - BP ok on current regimen.   4. Anemia - on chronic Procrit   5. HLD - on statin therapy - lab today  6. Mild LV dysfunction - most recent echo showing normal EF. She does not seem to be symptomatic.  7. Hypothyoidism - Rechecking TSH again today.   8. History of abnormal PFTs - just followed clinically - fortunately, she has no shortness of breath at this time.    9. Weight loss - worrisome to me - lab today. Will follow.   Current medicines are reviewed with the patient today.  The patient does not have concerns regarding medicines other than what has been noted above.  The following changes have been made:  See above.  Labs/ tests ordered today include:    Orders Placed This Encounter  Procedures  . Basic metabolic panel  . CBC  . Hepatic function panel  . TSH  . Lipid panel     Disposition:   FU with me in 4 months.   Patient is agreeable to this plan and will call if any problems develop in the interim.   Signed: Burtis Junes, RN, ANP-C 04/04/2016 9:15 AM  [facility] [address] [place], [place]  [address] Phone: [phone] Fax: [phone]

## 2016-04-04 NOTE — Patient Instructions (Addendum)
We will be checking the following labs today - BMET, CBC, HPF, Lipids, TSH   Medication Instructions:    Continue with your current medicines.     Testing/Procedures To Be Arranged:  N/A  Follow-Up:   See me in 4 months    Other Special Instructions:   N/A    If you need a refill on your cardiac medications before your next appointment, please call your pharmacy.   Call the [place] office at [phone] if you have any questions, problems or concerns.

## 2016-04-05 ENCOUNTER — Ambulatory Visit: Payer: Medicare Other | Admitting: Nurse Practitioner

## 2016-04-05 ENCOUNTER — Other Ambulatory Visit: Payer: Self-pay | Admitting: *Deleted

## 2016-04-05 DIAGNOSIS — I472 Ventricular tachycardia, unspecified: Secondary | ICD-10-CM

## 2016-04-23 ENCOUNTER — Other Ambulatory Visit: Payer: Self-pay | Admitting: Internal Medicine

## 2016-05-01 ENCOUNTER — Encounter (HOSPITAL_COMMUNITY)
Admission: RE | Admit: 2016-05-01 | Discharge: 2016-05-01 | Disposition: A | Discharge status: Home / Self Care | Payer: Medicare Other | Source: Ambulatory Visit | Attending: Internal Medicine | Admitting: Internal Medicine

## 2016-05-01 DIAGNOSIS — D649 Anemia, unspecified: Secondary | ICD-10-CM | POA: Insufficient documentation

## 2016-05-01 DIAGNOSIS — D631 Anemia in chronic kidney disease: Secondary | ICD-10-CM

## 2016-05-01 DIAGNOSIS — N189 Chronic kidney disease, unspecified: Secondary | ICD-10-CM

## 2016-05-01 LAB — POCT HEMOGLOBIN-HEMACUE: HEMOGLOBIN: 10.9 g/dL — AB (ref 12.0–15.0)

## 2016-05-01 MED ORDER — EPOETIN ALFA 20000 UNIT/ML IJ SOLN
INTRAMUSCULAR | Status: AC
  Administered 2016-05-01: 20000 [IU] via SUBCUTANEOUS
  Filled 2016-05-01: qty 1

## 2016-05-01 MED ORDER — EPOETIN ALFA 10000 UNIT/ML IJ SOLN
INTRAMUSCULAR | Status: AC
  Administered 2016-05-01: 10000 [IU] via SUBCUTANEOUS
  Filled 2016-05-01: qty 1

## 2016-05-01 MED ORDER — EPOETIN ALFA 10000 UNIT/ML IJ SOLN: 30000.0000 [IU] | INTRAMUSCULAR | Status: DC

## 2016-05-07 DIAGNOSIS — W010XXA Fall on same level from slipping, tripping and stumbling without subsequent striking against object, initial encounter: Secondary | ICD-10-CM

## 2016-05-07 HISTORY — DX: Fall on same level from slipping, tripping and stumbling without subsequent striking against object, initial encounter: W01.0XXA

## 2016-05-08 ENCOUNTER — Emergency Department (HOSPITAL_COMMUNITY): Payer: Medicare Other

## 2016-05-08 ENCOUNTER — Encounter (HOSPITAL_COMMUNITY): Payer: Self-pay | Admitting: Emergency Medicine

## 2016-05-08 ENCOUNTER — Inpatient Hospital Stay (HOSPITAL_COMMUNITY)
Admission: EM | Admit: 2016-05-08 | Discharge: 2016-05-12 | DRG: 493 | Disposition: A | Discharge status: Skilled Nursing Facility | Payer: Medicare Other | Attending: Internal Medicine | Admitting: Internal Medicine

## 2016-05-08 DIAGNOSIS — E872 Acidosis: Secondary | ICD-10-CM | POA: Diagnosis not present

## 2016-05-08 DIAGNOSIS — M6281 Muscle weakness (generalized): Secondary | ICD-10-CM | POA: Diagnosis not present

## 2016-05-08 DIAGNOSIS — E1122 Type 2 diabetes mellitus with diabetic chronic kidney disease: Secondary | ICD-10-CM

## 2016-05-08 DIAGNOSIS — Z79899 Other long term (current) drug therapy: Secondary | ICD-10-CM

## 2016-05-08 DIAGNOSIS — S79911A Unspecified injury of right hip, initial encounter: Secondary | ICD-10-CM | POA: Diagnosis not present

## 2016-05-08 DIAGNOSIS — E785 Hyperlipidemia, unspecified: Secondary | ICD-10-CM | POA: Diagnosis present

## 2016-05-08 DIAGNOSIS — D631 Anemia in chronic kidney disease: Secondary | ICD-10-CM | POA: Diagnosis present

## 2016-05-08 DIAGNOSIS — Z7984 Long term (current) use of oral hypoglycemic drugs: Secondary | ICD-10-CM

## 2016-05-08 DIAGNOSIS — S82143A Displaced bicondylar fracture of unspecified tibia, initial encounter for closed fracture: Secondary | ICD-10-CM

## 2016-05-08 DIAGNOSIS — Z419 Encounter for procedure for purposes other than remedying health state, unspecified: Secondary | ICD-10-CM

## 2016-05-08 DIAGNOSIS — M25561 Pain in right knee: Secondary | ICD-10-CM

## 2016-05-08 DIAGNOSIS — N189 Chronic kidney disease, unspecified: Secondary | ICD-10-CM | POA: Diagnosis not present

## 2016-05-08 DIAGNOSIS — Z4789 Encounter for other orthopedic aftercare: Secondary | ICD-10-CM | POA: Diagnosis not present

## 2016-05-08 DIAGNOSIS — N184 Chronic kidney disease, stage 4 (severe): Secondary | ICD-10-CM | POA: Diagnosis present

## 2016-05-08 DIAGNOSIS — I4891 Unspecified atrial fibrillation: Secondary | ICD-10-CM | POA: Diagnosis not present

## 2016-05-08 DIAGNOSIS — E039 Hypothyroidism, unspecified: Secondary | ICD-10-CM | POA: Diagnosis present

## 2016-05-08 DIAGNOSIS — S82112A Displaced fracture of left tibial spine, initial encounter for closed fracture: Secondary | ICD-10-CM | POA: Diagnosis not present

## 2016-05-08 DIAGNOSIS — D62 Acute posthemorrhagic anemia: Secondary | ICD-10-CM | POA: Diagnosis not present

## 2016-05-08 DIAGNOSIS — Z9181 History of falling: Secondary | ICD-10-CM | POA: Diagnosis not present

## 2016-05-08 DIAGNOSIS — S82301A Unspecified fracture of lower end of right tibia, initial encounter for closed fracture: Secondary | ICD-10-CM | POA: Diagnosis not present

## 2016-05-08 DIAGNOSIS — R031 Nonspecific low blood-pressure reading: Secondary | ICD-10-CM | POA: Diagnosis not present

## 2016-05-08 DIAGNOSIS — W1809XA Striking against other object with subsequent fall, initial encounter: Secondary | ICD-10-CM | POA: Diagnosis present

## 2016-05-08 DIAGNOSIS — S82141A Displaced bicondylar fracture of right tibia, initial encounter for closed fracture: Secondary | ICD-10-CM | POA: Diagnosis not present

## 2016-05-08 DIAGNOSIS — I471 Supraventricular tachycardia: Secondary | ICD-10-CM | POA: Diagnosis present

## 2016-05-08 DIAGNOSIS — E7800 Pure hypercholesterolemia, unspecified: Secondary | ICD-10-CM

## 2016-05-08 DIAGNOSIS — M1A9XX0 Chronic gout, unspecified, without tophus (tophi): Secondary | ICD-10-CM | POA: Diagnosis not present

## 2016-05-08 DIAGNOSIS — E876 Hypokalemia: Secondary | ICD-10-CM | POA: Diagnosis not present

## 2016-05-08 DIAGNOSIS — S82141D Displaced bicondylar fracture of right tibia, subsequent encounter for closed fracture with routine healing: Secondary | ICD-10-CM | POA: Diagnosis not present

## 2016-05-08 DIAGNOSIS — I10 Essential (primary) hypertension: Secondary | ICD-10-CM | POA: Diagnosis present

## 2016-05-08 DIAGNOSIS — R488 Other symbolic dysfunctions: Secondary | ICD-10-CM | POA: Diagnosis not present

## 2016-05-08 DIAGNOSIS — M109 Gout, unspecified: Secondary | ICD-10-CM | POA: Diagnosis not present

## 2016-05-08 DIAGNOSIS — R262 Difficulty in walking, not elsewhere classified: Secondary | ICD-10-CM

## 2016-05-08 DIAGNOSIS — E860 Dehydration: Secondary | ICD-10-CM | POA: Diagnosis not present

## 2016-05-08 DIAGNOSIS — R531 Weakness: Secondary | ICD-10-CM | POA: Diagnosis not present

## 2016-05-08 DIAGNOSIS — S82191A Other fracture of upper end of right tibia, initial encounter for closed fracture: Secondary | ICD-10-CM | POA: Diagnosis not present

## 2016-05-08 DIAGNOSIS — Y9222 Religious institution as the place of occurrence of the external cause: Secondary | ICD-10-CM

## 2016-05-08 DIAGNOSIS — E119 Type 2 diabetes mellitus without complications: Secondary | ICD-10-CM

## 2016-05-08 DIAGNOSIS — I129 Hypertensive chronic kidney disease with stage 1 through stage 4 chronic kidney disease, or unspecified chronic kidney disease: Secondary | ICD-10-CM | POA: Diagnosis not present

## 2016-05-08 DIAGNOSIS — N183 Chronic kidney disease, stage 3 (moderate): Secondary | ICD-10-CM | POA: Diagnosis not present

## 2016-05-08 DIAGNOSIS — R278 Other lack of coordination: Secondary | ICD-10-CM | POA: Diagnosis not present

## 2016-05-08 HISTORY — DX: Type 2 diabetes mellitus without complications: E11.9

## 2016-05-08 HISTORY — DX: Fall on same level from slipping, tripping and stumbling without subsequent striking against object, initial encounter: W01.0XXA

## 2016-05-08 LAB — BASIC METABOLIC PANEL
ANION GAP: 9 (ref 5–15)
BUN: 28 mg/dL — AB (ref 4–21)
BUN: 28 mg/dL — ABNORMAL HIGH (ref 6–20)
CHLORIDE: 113 mmol/L — AB (ref 101–111)
CO2: 20 mmol/L — AB (ref 22–32)
Calcium: 8.8 mg/dL — ABNORMAL LOW (ref 8.9–10.3)
Creatinine, Ser: 1.89 mg/dL — ABNORMAL HIGH (ref 0.44–1.00)
Creatinine: 1.9 mg/dL — AB (ref 0.5–1.1)
GFR calc non Af Amer: 23 mL/min — ABNORMAL LOW (ref >60)
GFR, EST AFRICAN AMERICAN: 27 mL/min — AB (ref >60)
GLUCOSE: 127 mg/dL
Glucose, Bld: 127 mg/dL — ABNORMAL HIGH (ref 65–99)
Potassium: 3.9 mmol/L (ref 3.4–5.3)
Potassium: 3.9 mmol/L (ref 3.5–5.1)
Sodium: 142 mmol/L (ref 137–147)
Sodium: 142 mmol/L (ref 135–145)

## 2016-05-08 LAB — CBC WITH DIFFERENTIAL/PLATELET
BASOS PCT: 0 %
Basophils Absolute: 0 10*3/uL (ref 0.0–0.1)
Eosinophils Absolute: 0.1 10*3/uL (ref 0.0–0.7)
Eosinophils Relative: 1 %
HEMATOCRIT: 28.4 % — AB (ref 36.0–46.0)
HEMOGLOBIN: 9.5 g/dL — AB (ref 12.0–15.0)
LYMPHS ABS: 1.7 10*3/uL (ref 0.7–4.0)
Lymphocytes Relative: 21 %
MCH: 29.2 pg (ref 26.0–34.0)
MCHC: 33.5 g/dL (ref 30.0–36.0)
MCV: 87.4 fL (ref 78.0–100.0)
MONOS PCT: 9 %
Monocytes Absolute: 0.8 10*3/uL (ref 0.1–1.0)
NEUTROS ABS: 5.6 10*3/uL (ref 1.7–7.7)
NEUTROS PCT: 69 %
Platelets: 151 10*3/uL (ref 150–400)
RBC: 3.25 MIL/uL — ABNORMAL LOW (ref 3.87–5.11)
RDW: 17.4 % — ABNORMAL HIGH (ref 11.5–15.5)
WBC: 8.2 10*3/uL (ref 4.0–10.5)

## 2016-05-08 LAB — CBC AND DIFFERENTIAL
HCT: 28 % — AB (ref 36–46)
Hemoglobin: 9.5 g/dL — AB (ref 12.0–16.0)
PLATELETS: 151 10*3/uL (ref 150–399)
WBC: 8.2 10*3/mL

## 2016-05-08 MED ORDER — LEVOTHYROXINE SODIUM 100 MCG PO TABS
100.0000 ug | ORAL_TABLET | Freq: Every day | ORAL | Status: DC
  Administered 2016-05-09 – 2016-05-12 (×4): 100 ug via ORAL
  Filled 2016-05-08 (×2): qty 1
  Filled 2016-05-08: qty 2
  Filled 2016-05-08: qty 1

## 2016-05-08 MED ORDER — AMLODIPINE BESYLATE 10 MG PO TABS
10.0000 mg | ORAL_TABLET | Freq: Every day | ORAL | Status: DC
  Administered 2016-05-08 – 2016-05-11 (×4): 10 mg via ORAL
  Filled 2016-05-08 (×4): qty 1

## 2016-05-08 MED ORDER — BISACODYL 5 MG PO TBEC: 5.0000 mg | DELAYED_RELEASE_TABLET | Freq: Every day | ORAL | Status: DC | PRN

## 2016-05-08 MED ORDER — ACETAMINOPHEN 650 MG RE SUPP: 650.0000 mg | Freq: Four times a day (QID) | RECTAL | Status: DC | PRN

## 2016-05-08 MED ORDER — HYDRALAZINE HCL 50 MG PO TABS
50.0000 mg | ORAL_TABLET | Freq: Two times a day (BID) | ORAL | Status: DC
  Administered 2016-05-09 – 2016-05-12 (×7): 50 mg via ORAL
  Filled 2016-05-08 (×7): qty 1

## 2016-05-08 MED ORDER — POTASSIUM CHLORIDE CRYS ER 10 MEQ PO TBCR
10.0000 meq | EXTENDED_RELEASE_TABLET | Freq: Every morning | ORAL | Status: DC
  Administered 2016-05-09 – 2016-05-12 (×4): 10 meq via ORAL
  Filled 2016-05-08 (×8): qty 1

## 2016-05-08 MED ORDER — AMIODARONE HCL 200 MG PO TABS
100.0000 mg | ORAL_TABLET | Freq: Every morning | ORAL | Status: DC
  Administered 2016-05-09 – 2016-05-12 (×4): 100 mg via ORAL
  Filled 2016-05-08 (×4): qty 1

## 2016-05-08 MED ORDER — IRBESARTAN 300 MG PO TABS
300.0000 mg | ORAL_TABLET | Freq: Every day | ORAL | Status: DC
  Administered 2016-05-09 – 2016-05-12 (×4): 300 mg via ORAL
  Filled 2016-05-08 (×4): qty 1

## 2016-05-08 MED ORDER — INSULIN ASPART 100 UNIT/ML ~~LOC~~ SOLN
0.0000 [IU] | Freq: Three times a day (TID) | SUBCUTANEOUS | Status: DC
  Administered 2016-05-09: 1 [IU] via SUBCUTANEOUS
  Administered 2016-05-11: 3 [IU] via SUBCUTANEOUS
  Administered 2016-05-12 (×2): 1 [IU] via SUBCUTANEOUS

## 2016-05-08 MED ORDER — SENNOSIDES-DOCUSATE SODIUM 8.6-50 MG PO TABS: 1.0000 | ORAL_TABLET | Freq: Every evening | ORAL | Status: DC | PRN

## 2016-05-08 MED ORDER — INSULIN ASPART 100 UNIT/ML ~~LOC~~ SOLN: 0.0000 [IU] | Freq: Every day | SUBCUTANEOUS | Status: DC

## 2016-05-08 MED ORDER — ADULT MULTIVITAMIN W/MINERALS CH
1.0000 | ORAL_TABLET | Freq: Every day | ORAL | Status: DC
  Administered 2016-05-09 – 2016-05-12 (×4): 1 via ORAL
  Filled 2016-05-08 (×4): qty 1

## 2016-05-08 MED ORDER — HYDROCHLOROTHIAZIDE 25 MG PO TABS
25.0000 mg | ORAL_TABLET | Freq: Every day | ORAL | Status: DC
  Administered 2016-05-09 – 2016-05-12 (×4): 25 mg via ORAL
  Filled 2016-05-08 (×4): qty 1

## 2016-05-08 MED ORDER — PRAVASTATIN SODIUM 10 MG PO TABS
20.0000 mg | ORAL_TABLET | Freq: Every day | ORAL | Status: DC
  Administered 2016-05-08 – 2016-05-11 (×4): 20 mg via ORAL
  Filled 2016-05-08 (×4): qty 2

## 2016-05-08 MED ORDER — TELMISARTAN-HCTZ 80-25 MG PO TABS: 1.0000 | ORAL_TABLET | Freq: Every day | ORAL | Status: DC

## 2016-05-08 MED ORDER — ACETAMINOPHEN 325 MG PO TABS: 650.0000 mg | ORAL_TABLET | Freq: Four times a day (QID) | ORAL | Status: DC | PRN

## 2016-05-08 MED ORDER — ACETAMINOPHEN 325 MG PO TABS
650.0000 mg | ORAL_TABLET | Freq: Once | ORAL | Status: AC
  Administered 2016-05-08: 650 mg via ORAL
  Filled 2016-05-08: qty 2

## 2016-05-08 NOTE — ED Notes (Signed)
MD at bedside. 

## 2016-05-08 NOTE — ED Provider Notes (Signed)
[place] DEPT Provider Note   CSN: [id number] Arrival date & time: 05/08/16  0714     History   Chief Complaint Chief Complaint  Patient presents with  . Fall  . Knee Pain    HPI Leslie Coleman is a 82 y.o. female.  82 year old Caucasian female past medical history is for hypertension, hypothyroidism, obesity, diabetes, gout presents to the ED today after sustaining a fall yesterday. Patient is coming in by EMS from home for witnessed fall at church yesterday. Patient states that she was walking out the door when her feet got tripped on the rug and she fell onto her right knee. Patient lives at home with family is able to get around with walker and cane. However this morning patient is not able to bear any weight on her right knee. She has been using ice. Has not taken any pain medicine prior to arrival. Patient has limited range of motion right knee. Patient denies hitting her head. She denies LOC. She denies any fever, chills, headache, vision changes, back pain, neck pain, hip pain, ankle pain, abdominal pain, nausea, emesis, urinary symptoms, change in bowel habits.    Fall  Pertinent negatives include no chest pain, no abdominal pain, no headaches and no shortness of breath.  Knee Pain   Pertinent negatives include no numbness.    Past Medical History:  Diagnosis Date  . Abnormal PFTs (pulmonary function tests) 2008   No obstruction noted. Has been followed clinically.  . Anemia    treated with Procrit  . Diabetes mellitus   . Gout   . High risk medication use    on amiodarone.  Marland Kitchen HTN (hypertension)    Last echo in 2004; EF normal  . Hyperlipidemia   . Hypothyroidism    on replacement  . Obesity   . V-tach [facility])    remote VT, treated initially with procainamide, on amiodarone since 2008    Patient Active Problem List   Diagnosis Date Noted  . Anemia in chronic kidney disease 12/16/2015  . Dizzy 07/07/2011  . Dehydration 07/07/2011  . Diabetes  mellitus 07/06/2011  . History of long-term treatment with high-risk medication 08/29/2010  . Abnormal PFTs (pulmonary function tests)   . V-tach ([place])   . HTN (hypertension)   . Hyperlipidemia   . Gout     Past Surgical History:  Procedure Laterality Date  . ABDOMINAL HYSTERECTOMY    . CARDIOVASCULAR STRESS TEST  07/07/2003   EF 70%. NO EVIDENCE OF ISCHEMIA  . CARDIOVERSION  1978  . CESAREAN SECTION     x 3  . US ECHOCARDIOGRAPHY  12/29/2002   EF 60-65%    OB History    No data available       Home Medications    Prior to Admission medications   Medication Sig Start Date End Date Taking? Authorizing Provider  acetaminophen (TYLENOL) 500 MG tablet Take 500 mg by mouth at bedtime.     Historical Provider, MD  allopurinol (ZYLOPRIM) 300 MG tablet TAKE 1 TABLET BY MOUTH EVERY DAY 05/12/15   Thompson Grayer, MD  amiodarone (PACERONE) 100 MG tablet Take 1 tablet (100 mg total) by mouth every morning. 11/01/15   Burtis Junes, NP  amLODipine (NORVASC) 10 MG tablet Take 10 mg by mouth at bedtime.    Historical Provider, MD  Epoetin Alfa (PROCRIT IJ) Inject 10,000 Units as directed every 28 (twenty-eight) days. As directed every 4 weeks.    Historical Provider, MD  glipiZIDE (  GLUCOTROL XL) 5 MG 24 hr tablet Take 5 mg by mouth daily. 07/12/15   Historical Provider, MD  hydrALAZINE (APRESOLINE) 50 MG tablet TAKE 1 TABLET BY MOUTH TWICE A DAY 04/24/16   Thompson Grayer, MD  levothyroxine (SYNTHROID, LEVOTHROID) 100 MCG tablet Take 1 tablet (100 mcg total) by mouth daily before breakfast. 11/02/15   Burtis Junes, NP  omega-3 acid ethyl esters (LOVAZA) 1 g capsule TAKE 2 CAPSULES BY MOUTH 2 TIMES DAILY. 08/11/15   Burtis Junes, NP  oxymetazoline (AFRIN NASAL SPRAY) 0.05 % nasal spray Place 1 spray into both nostrils 2 (two) times daily as needed (nosebleed, no more than 3 days). 12/26/14   Leo Grosser, MD  potassium chloride (KLOR-CON 10) 10 MEQ tablet Take 1 tablet (10 mEq total) by mouth every  morning. 02/25/16   Burtis Junes, NP  pravastatin (PRAVACHOL) 20 MG tablet TAKE 1 TABLET BY MOUTH AT BEDTIME 08/24/15   Burtis Junes, NP  telmisartan-hydrochlorothiazide (MICARDIS HCT) 80-25 MG tablet Take 1 tablet by mouth daily. 07/31/15   Historical Provider, MD    Family History Family History  Problem Relation Age of Onset  . Heart disease Mother   . Heart disease Father     Social History Social History  Substance Use Topics  . Smoking status: Never Smoker  . Smokeless tobacco: Never Used  . Alcohol use No     Allergies   Patient has no known allergies.   Review of Systems Review of Systems  Constitutional: Negative for chills and fever.  HENT: Negative for ear pain and sore throat.   Eyes: Negative for pain and visual disturbance.  Respiratory: Negative for cough and shortness of breath.   Cardiovascular: Negative for chest pain and palpitations.  Gastrointestinal: Negative for abdominal pain and vomiting.  Genitourinary: Negative for dysuria and hematuria.  Musculoskeletal: Positive for arthralgias, gait problem and joint swelling. Negative for back pain and neck pain.  Skin: Negative for color change and rash.  Neurological: Negative for dizziness, syncope, weakness, light-headedness, numbness and headaches.  All other systems reviewed and are negative.    Physical Exam Updated Vital Signs BP 143/74 (BP Location: Left Arm)   Pulse 90   Temp 97.5 F (36.4 C) (Oral)   Resp 18   SpO2 95%   Physical Exam  Constitutional: She is oriented to person, place, and time. She appears well-developed and well-nourished. No distress.  HENT:  Head: Normocephalic and atraumatic.  Eyes: Conjunctivae and EOM are normal. Pupils are equal, round, and reactive to light. Right eye exhibits no discharge. Left eye exhibits no discharge. No scleral icterus.  Neck: Normal range of motion. Neck supple.  Midline C-spine tenderness. No deformity step-offs or crepitus noted.    Pulmonary/Chest: No respiratory distress.  Musculoskeletal:       Right knee: She exhibits decreased range of motion, swelling, ecchymosis and bony tenderness. She exhibits no effusion, no deformity, no laceration, no erythema, no LCL laxity, normal patellar mobility and normal meniscus. Tenderness found. Medial joint line and lateral joint line tenderness noted.  DP pulses are 2+ bilaterally. Sensation intact. Cap refill normal. No midline T-spine or L-spine tenderness. No deformities or step-offs noted. Patient is able to move all other extremities without any difficulties. Pelvis is stable without any tenderness.  Neurological: She is alert and oriented to person, place, and time.  Skin: Skin is warm and dry. Capillary refill takes less than 2 seconds. No pallor.  Nursing note and vitals reviewed.  ED Treatments / Results  Labs (all labs ordered are listed, but only abnormal results are displayed) Labs Reviewed  BASIC METABOLIC PANEL - Abnormal; Notable for the following:       Result Value   Chloride 113 (*)    CO2 20 (*)    Glucose, Bld 127 (*)    BUN 28 (*)    Creatinine, Ser 1.89 (*)    Calcium 8.8 (*)    GFR calc non Af Amer 23 (*)    GFR calc Af Amer 27 (*)    All other components within normal limits  CBC WITH DIFFERENTIAL/PLATELET - Abnormal; Notable for the following:    RBC 3.25 (*)    Hemoglobin 9.5 (*)    HCT 28.4 (*)    RDW 17.4 (*)    All other components within normal limits    EKG  EKG Interpretation None       Radiology Ct Knee Right Wo Contrast  Result Date: 05/08/2016 CLINICAL DATA:  Right knee pain EXAM: CT OF THE RIGHT KNEE WITHOUT CONTRAST TECHNIQUE: Multidetector CT imaging of the RIGHT knee was performed according to the standard protocol. Multiplanar CT image reconstructions were also generated. COMPARISON:  None. FINDINGS: Bones/Joint/Cartilage Comminuted and depressed lateral tibial plateau fracture. 10 mm of depression of the articular  surface. Area of depression measures 2.5 x 2.3 cm. Severe osteopenia. No other fracture or dislocation. Mild osteoarthritis of the medial femorotibial compartment. Large joint effusion. Small Baker cyst. Ligaments Suboptimally assessed by CT. Muscles and Tendons Muscles are normal. No significant muscle atrophy. Intact quadriceps tendon and patellar tendon. Soft tissues No fluid collection or hematoma. No soft tissue mass. There is peripheral vascular atherosclerotic disease. IMPRESSION: 1. Comminuted and depressed lateral tibial plateau fracture most consistent with Schatzker II classification. Electronically Signed   By: Kathreen Devoid   On: 05/08/2016 10:00   Dg Knee Complete 4 Views Right  Result Date: 05/08/2016 CLINICAL DATA:  Post fall stepping through a door at church yesterday. EXAM: RIGHT KNEE - COMPLETE 4+ VIEW COMPARISON:  Right hip radiographs-earlier same day FINDINGS: Osteopenia. There is a potential minimally displaced fracture involving the weight-bearing surface of the lateral aspects of the tibial plateau with associated small knee joint effusion. No definitive evidence of lipohemarthrosis. Mild tricompartmental degenerative change of the knee with joint space loss, subchondral sclerosis osteophytosis. Query minimal amount of chondrocalcinosis within the medial and lateral joint spaces. Vascular calcifications.  No radiopaque foreign body. IMPRESSION: Osteopenia with potential minimally displaced fracture involving the lateral weight-bearing aspect of the tibial plateau. Correlation for tenderness at this location is recommended. Further evaluation with knee CT could be performed as indicated. Electronically Signed   By: Sandi Mariscal M.D.   On: 05/08/2016 08:33   Dg Hip Unilat W Or Wo Pelvis 2-3 Views Right  Result Date: 05/08/2016 CLINICAL DATA:  Post fall stepping through a door at church yesterday. EXAM: DG HIP (WITH OR WITHOUT PELVIS) 2-3V RIGHT COMPARISON:  None. FINDINGS: No fracture  or dislocation. Right hip joint spaces are preserved. No evidence avascular necrosis. Limited visualization of the adjacent pelvis is negative for displaced fracture. Moderate severe multilevel lumbar spine DDD is suspected though incompletely evaluated. Several phleboliths overlie the lower pelvis bilaterally. Suspected bone island overlies the cranial aspect of the left ilium, incompletely evaluated. Vascular calcifications. No radiopaque foreign body. IMPRESSION: 1. No displaced right-sided rib fracture. 2. Moderate severe multilevel lumbar spine DDD is suspected though incompletely evaluated. Electronically Signed   By: [name]  Watts M.D.   On: 05/08/2016 08:34    Procedures Procedures (including critical care time)  Medications Ordered in ED Medications  acetaminophen (TYLENOL) tablet 650 mg (650 mg Oral Given 05/08/16 0916)     Initial Impression / Assessment and Plan / ED Course  I have reviewed the triage vital signs and the nursing notes.  Pertinent labs & imaging results that were available during my care of the patient were reviewed by me and considered in my medical decision making (see chart for details).  Clinical Course   Patient presents to the ED after sustaining a mechanical fall yesterday. She complains of right knee pain. X-rays concerning for tibial plateau fracture. CT ordered that showed a comminuted and depressed lateral tibial plateau fracture. She is neurovascularly intact. Spoke with Dr. Tamera Punt with orthopedic surgery and he states he will talk to the trauma surgeon Dr Marcelino Scot but would like patient admitted to the hospital service. They will consult the patient once admitted over to [facility]. They will see her at [facility]. Talked with Dr. Osborne Casco triad hospitalist who agrees to see patient and place admission orders for patient to be admitted at [facility]. Basic labs ordered are unremarkable. Patient with history of anemia and hemoglobin is at baseline. Patient has been  nonambulatory in ED. She is hemodynamic stable in no acute distress. Family is at bedside and is agreeable with the above plan. Patient was seen and examined by Dr. Kathrynn Humble who is agreeable to the above plan. Final Clinical Impressions(s) / ED Diagnoses   Final diagnoses:  Right knee pain  Closed fracture of right tibial plateau, initial encounter    New Prescriptions New Prescriptions   No medications on file     Doristine Devoid, PA-C 05/08/16 1518    Varney Biles, MD 05/09/16 2329

## 2016-05-08 NOTE — Consult Note (Signed)
Reason for Consult:right knee injury Referring Physician: Jacquelin Hawking is an 82 y.o. female.  HPI: 82 yo female with hx of HTN, DM, anemia presents today to ED after fall at church yesterday. She tripped on a run, landing on right knee. Was able to be helped home by her son. Could not bear weight this morning and taken to ED. At baseline she lives independently at home with her husband. She walks with a walker or cane. No recent surgery. Denies prior knee pain or problems.   Past Medical History:  Diagnosis Date  . Abnormal PFTs (pulmonary function tests) 2008   No obstruction noted. Has been followed clinically.  . Anemia    treated with Procrit  . Diabetes mellitus   . Gout   . High risk medication use    on amiodarone.  Marland Kitchen HTN (hypertension)    Last echo in 2004; EF normal  . Hyperlipidemia   . Hypothyroidism    on replacement  . Obesity   . V-tach [facility])    remote VT, treated initially with procainamide, on amiodarone since 2008    Past Surgical History:  Procedure Laterality Date  . ABDOMINAL HYSTERECTOMY    . CARDIOVASCULAR STRESS TEST  07/07/2003   EF 70%. NO EVIDENCE OF ISCHEMIA  . CARDIOVERSION  1978  . CESAREAN SECTION     x 3  . US ECHOCARDIOGRAPHY  12/29/2002   EF 60-65%    Family History  Problem Relation Age of Onset  . Heart disease Mother   . Heart disease Father     Social History:  reports that she has never smoked. She has never used smokeless tobacco. She reports that she does not drink alcohol or use drugs.  Allergies: No Known Allergies  Medications: I have reviewed the patient's current medications.  Results for orders placed or performed during the hospital encounter of 05/08/16 (from the past 48 hour(s))  Basic metabolic panel     Status: Abnormal   Collection Time: 05/08/16 11:41 AM  Result Value Ref Range   Sodium 142 135 - 145 mmol/L   Potassium 3.9 3.5 - 5.1 mmol/L   Chloride 113 (H) 101 - 111 mmol/L   CO2 20 (L) 22 - 32  mmol/L   Glucose, Bld 127 (H) 65 - 99 mg/dL   BUN 28 (H) 6 - 20 mg/dL   Creatinine, Ser 1.89 (H) 0.44 - 1.00 mg/dL   Calcium 8.8 (L) 8.9 - 10.3 mg/dL   GFR calc non Af Amer 23 (L) >60 mL/min   GFR calc Af Amer 27 (L) >60 mL/min    Comment: (NOTE) The eGFR has been calculated using the CKD EPI equation. This calculation has not been validated in all clinical situations. eGFR's persistently <60 mL/min signify possible Chronic Kidney Disease.    Anion gap 9 5 - 15  CBC with Differential     Status: Abnormal   Collection Time: 05/08/16 11:41 AM  Result Value Ref Range   WBC 8.2 4.0 - 10.5 K/uL   RBC 3.25 (L) 3.87 - 5.11 MIL/uL   Hemoglobin 9.5 (L) 12.0 - 15.0 g/dL   HCT 28.4 (L) 36.0 - 46.0 %   MCV 87.4 78.0 - 100.0 fL   MCH 29.2 26.0 - 34.0 pg   MCHC 33.5 30.0 - 36.0 g/dL   RDW 17.4 (H) 11.5 - 15.5 %   Platelets 151 150 - 400 K/uL   Neutrophils Relative % 69 %   Neutro Abs  5.6 1.7 - 7.7 K/uL   Lymphocytes Relative 21 %   Lymphs Abs 1.7 0.7 - 4.0 K/uL   Monocytes Relative 9 %   Monocytes Absolute 0.8 0.1 - 1.0 K/uL   Eosinophils Relative 1 %   Eosinophils Absolute 0.1 0.0 - 0.7 K/uL   Basophils Relative 0 %   Basophils Absolute 0.0 0.0 - 0.1 K/uL    Ct Knee Right Wo Contrast  Result Date: 05/08/2016 CLINICAL DATA:  Right knee pain EXAM: CT OF THE RIGHT KNEE WITHOUT CONTRAST TECHNIQUE: Multidetector CT imaging of the RIGHT knee was performed according to the standard protocol. Multiplanar CT image reconstructions were also generated. COMPARISON:  None. FINDINGS: Bones/Joint/Cartilage Comminuted and depressed lateral tibial plateau fracture. 10 mm of depression of the articular surface. Area of depression measures 2.5 x 2.3 cm. Severe osteopenia. No other fracture or dislocation. Mild osteoarthritis of the medial femorotibial compartment. Large joint effusion. Small Baker cyst. Ligaments Suboptimally assessed by CT. Muscles and Tendons Muscles are normal. No significant muscle  atrophy. Intact quadriceps tendon and patellar tendon. Soft tissues No fluid collection or hematoma. No soft tissue mass. There is peripheral vascular atherosclerotic disease. IMPRESSION: 1. Comminuted and depressed lateral tibial plateau fracture most consistent with Schatzker II classification. Electronically Signed   By: Kathreen Devoid   On: 05/08/2016 10:00   Dg Knee Complete 4 Views Right  Result Date: 05/08/2016 CLINICAL DATA:  Post fall stepping through a door at church yesterday. EXAM: RIGHT KNEE - COMPLETE 4+ VIEW COMPARISON:  Right hip radiographs-earlier same day FINDINGS: Osteopenia. There is a potential minimally displaced fracture involving the weight-bearing surface of the lateral aspects of the tibial plateau with associated small knee joint effusion. No definitive evidence of lipohemarthrosis. Mild tricompartmental degenerative change of the knee with joint space loss, subchondral sclerosis osteophytosis. Query minimal amount of chondrocalcinosis within the medial and lateral joint spaces. Vascular calcifications.  No radiopaque foreign body. IMPRESSION: Osteopenia with potential minimally displaced fracture involving the lateral weight-bearing aspect of the tibial plateau. Correlation for tenderness at this location is recommended. Further evaluation with knee CT could be performed as indicated. Electronically Signed   By: Sandi Mariscal M.D.   On: 05/08/2016 08:33   Dg Hip Unilat W Or Wo Pelvis 2-3 Views Right  Result Date: 05/08/2016 CLINICAL DATA:  Post fall stepping through a door at church yesterday. EXAM: DG HIP (WITH OR WITHOUT PELVIS) 2-3V RIGHT COMPARISON:  None. FINDINGS: No fracture or dislocation. Right hip joint spaces are preserved. No evidence avascular necrosis. Limited visualization of the adjacent pelvis is negative for displaced fracture. Moderate severe multilevel lumbar spine DDD is suspected though incompletely evaluated. Several phleboliths overlie the lower pelvis  bilaterally. Suspected bone island overlies the cranial aspect of the left ilium, incompletely evaluated. Vascular calcifications. No radiopaque foreign body. IMPRESSION: 1. No displaced right-sided rib fracture. 2. Moderate severe multilevel lumbar spine DDD is suspected though incompletely evaluated. Electronically Signed   By: Sandi Mariscal M.D.   On: 05/08/2016 08:34    Review of Systems  Musculoskeletal:       Right knee pain and swelling  All other systems reviewed and are negative.  Blood pressure 166/76, pulse 88, temperature 97.8 F (36.6 C), temperature source Oral, resp. rate 16, SpO2 93 %. Physical Exam  Constitutional: She is oriented to person, place, and time. She appears well-developed and well-nourished.  HENT:  Head: Normocephalic and atraumatic.  Eyes: EOM are normal.  Neck: Normal range of motion.  Cardiovascular:  Intact distal pulses.   Respiratory: Effort normal.  GI: Soft.  Musculoskeletal:  R knee with mod swelling, small effusion Slight valgus laxity ROM limited by pain Distally NVI  Neurological: She is alert and oriented to person, place, and time.  Skin: Skin is dry.  Psychiatric: She has a normal mood and affect. Her behavior is normal. Judgment and thought content normal.    Assessment/Plan: Minimally displaced right lateral tibial plateau fracture Recommend transfer to [place]  NWB R LE rec knee immoblizer for stability Should get up with PT Will consult with trauma Dr. Marcelino Scot regarding op vs nonop treatment NPO after midnight, no plan for surgery today Will continue to follow with plan by Dr. Trilby Drummer, Nirvana Blanchett 05/08/2016, 3:02 PM

## 2016-05-08 NOTE — H&P (Signed)
History and Physical    Leslie Coleman UQJ:[id number] DOB: 1928/05/24 DOA: 05/08/2016  PCP: [name], MD   Patient coming from: Home  Chief Complaint: s/p fall at home  HPI: Leslie Coleman is a 82 y.o. female with medical history significant of HTN, hypothyroidism, diabetes mellitus, anemia presents after a fall yesterday at church. Patient states that she was walking out the door when her feet got tripped on the rug and she fell onto her right knee. Patient lives at home with family is able to get around with walker and cane. However this morning patient is not able to bear any weight on her right knee. She has been using ice. Has not taken any pain medicine prior to arrival. Patient has limited range of motion right knee. Patient denies hitting her head. She denies LOC. She denies any fever, chills, headache, vision changes, back pain, neck pain, chest pain, palpitations, hip pain, ankle pain, abdominal pain, nausea, emesis, urinary symptoms, change in bowel habits.  She says she took a tylenol prior to coming into the ED today.  She has not been able to bear weight on her leg since the fall. Stable on her chronic medications.      ED Course: Seen and evaluated.  Found to have Comminuted and depressed lateral tibial plateau fracture most consistent with Schatzker II classification. Dr. Tamera Punt from orthopedics consulted by EDP and will see patient at [facility] to make determination whether or not patient will require surgery today.   Review of Systems: As per HPI otherwise 10 point review of systems negative.    Past Medical History:  Diagnosis Date  . Abnormal PFTs (pulmonary function tests) 2008   No obstruction noted. Has been followed clinically.  . Anemia    treated with Procrit  . Diabetes mellitus   . Gout   . High risk medication use    on amiodarone.  Marland Kitchen HTN (hypertension)    Last echo in 2004; EF normal  . Hyperlipidemia   . Hypothyroidism    on replacement  .  Obesity   . V-tach [facility])    remote VT, treated initially with procainamide, on amiodarone since 2008    Past Surgical History:  Procedure Laterality Date  . ABDOMINAL HYSTERECTOMY    . CARDIOVASCULAR STRESS TEST  07/07/2003   EF 70%. NO EVIDENCE OF ISCHEMIA  . CARDIOVERSION  1978  . CESAREAN SECTION     x 3  . US ECHOCARDIOGRAPHY  12/29/2002   EF 60-65%     reports that she has never smoked. She has never used smokeless tobacco. She reports that she does not drink alcohol or use drugs.  No Known Allergies  Family History  Problem Relation Age of Onset  . Heart disease Mother   . Heart disease Father      Prior to Admission medications   Medication Sig Start Date End Date Taking? Authorizing Provider  acetaminophen (TYLENOL) 500 MG tablet Take 500 mg by mouth at bedtime.    Yes Historical Provider, MD  amiodarone (PACERONE) 100 MG tablet Take 1 tablet (100 mg total) by mouth every morning. 11/01/15  Yes Burtis Junes, NP  amLODipine (NORVASC) 10 MG tablet Take 10 mg by mouth at bedtime.   Yes Historical Provider, MD  Epoetin Alfa (PROCRIT IJ) Inject 10,000 Units as directed every 28 (twenty-eight) days. As directed every 4 weeks.   Yes Historical Provider, MD  glipiZIDE (GLUCOTROL XL) 5 MG 24 hr tablet Take 5 mg  by mouth daily. 04/03/16  Yes Historical Provider, MD  hydrALAZINE (APRESOLINE) 50 MG tablet TAKE 1 TABLET BY MOUTH TWICE A DAY 04/24/16  Yes Thompson Grayer, MD  levothyroxine (SYNTHROID, LEVOTHROID) 100 MCG tablet Take 1 tablet (100 mcg total) by mouth daily before breakfast. 11/02/15  Yes Burtis Junes, NP  Multiple Vitamin (MULTIVITAMIN WITH MINERALS) TABS tablet Take 1 tablet by mouth daily.   Yes Historical Provider, MD  naproxen sodium (ANAPROX) 220 MG tablet Take 220 mg by mouth every morning.   Yes Historical Provider, MD  omega-3 acid ethyl esters (LOVAZA) 1 g capsule TAKE 2 CAPSULES BY MOUTH 2 TIMES DAILY. 08/11/15  Yes Burtis Junes, NP  potassium chloride  (KLOR-CON 10) 10 MEQ tablet Take 1 tablet (10 mEq total) by mouth every morning. 02/25/16  Yes Burtis Junes, NP  pravastatin (PRAVACHOL) 20 MG tablet TAKE 1 TABLET BY MOUTH AT BEDTIME 08/24/15  Yes Burtis Junes, NP  telmisartan-hydrochlorothiazide (MICARDIS HCT) 80-25 MG tablet Take 1 tablet by mouth daily. 07/31/15  Yes Historical Provider, MD  allopurinol (ZYLOPRIM) 300 MG tablet TAKE 1 TABLET BY MOUTH EVERY DAY Patient not taking: Reported on 05/08/2016 05/12/15   Thompson Grayer, MD  oxymetazoline (AFRIN NASAL SPRAY) 0.05 % nasal spray Place 1 spray into both nostrils 2 (two) times daily as needed (nosebleed, no more than 3 days). Patient not taking: Reported on 05/08/2016 12/26/14   Leo Grosser, MD    Physical Exam: Vitals:   05/08/16 0718 05/08/16 0725 05/08/16 0917 05/08/16 1145  BP:  143/74 162/70 156/70  Pulse:  90 90 87  Resp:  18 16 16   Temp:  97.5 F (36.4 C)    TempSrc:  Oral    SpO2: 98% 95% 94% 92%      Constitutional: NAD, calm, comfortable Vitals:   05/08/16 0718 05/08/16 0725 05/08/16 0917 05/08/16 1145  BP:  143/74 162/70 156/70  Pulse:  90 90 87  Resp:  18 16 16   Temp:  97.5 F (36.4 C)    TempSrc:  Oral    SpO2: 98% 95% 94% 92%   Eyes: PERRL, lids and conjunctivae normal ENMT: Mucous membranes are moist. Posterior pharynx clear of any exudate or lesions.Normal dentition.  Neck: normal, supple, no masses, no thyromegaly Respiratory: clear to auscultation bilaterally, no wheezing, no crackles. Normal respiratory effort. No accessory muscle use.  Cardiovascular: Regular rate and rhythm, no murmurs / rubs / gallops. No extremity edema. 2+ pedal pulses. No carotid bruits.  Abdomen: no tenderness, no masses palpated. No hepatosplenomegaly. Bowel sounds positive.  Musculoskeletal: no clubbing / cyanosis. No joint deformity upper and lower extremities. Good ROM of upper extremities, no contractures. Normal muscle tone. Able to move feet and ankles Skin: no rashes,  lesions, ulcers. No induration. Ecchymosis of the right ventral foot Neurologic: CN 2-12 grossly intact. Sensation intact, DTR normal. Strength 5/5 in all upper extremities- did not test lower extremities.  Psychiatric: Normal judgment and insight. Alert and oriented x 3. Normal mood.     Labs on Admission: I have personally reviewed following labs and imaging studies  CBC:  Recent Labs Lab 05/08/16 1141  WBC 8.2  NEUTROABS 5.6  HGB 9.5*  HCT 28.4*  MCV 87.4  PLT 938   Basic Metabolic Panel:  Recent Labs Lab 05/08/16 1141  NA 142  K 3.9  CL 113*  CO2 20*  GLUCOSE 127*  BUN 28*  CREATININE 1.89*  CALCIUM 8.8*   GFR: CrCl cannot be calculated (  Unknown ideal weight.). Liver Function Tests: No results for input(s): AST, ALT, ALKPHOS, BILITOT, PROT, ALBUMIN in the last 168 hours. No results for input(s): LIPASE, AMYLASE in the last 168 hours. No results for input(s): AMMONIA in the last 168 hours. Coagulation Profile: No results for input(s): INR, PROTIME in the last 168 hours. Cardiac Enzymes: No results for input(s): CKTOTAL, CKMB, CKMBINDEX, TROPONINI in the last 168 hours. BNP (last 3 results) No results for input(s): PROBNP in the last 8760 hours. HbA1C: No results for input(s): HGBA1C in the last 72 hours. CBG: No results for input(s): GLUCAP in the last 168 hours. Lipid Profile: No results for input(s): CHOL, HDL, LDLCALC, TRIG, CHOLHDL, LDLDIRECT in the last 72 hours. Thyroid Function Tests: No results for input(s): TSH, T4TOTAL, FREET4, T3FREE, THYROIDAB in the last 72 hours. Anemia Panel: No results for input(s): VITAMINB12, FOLATE, FERRITIN, TIBC, IRON, RETICCTPCT in the last 72 hours. Urine analysis:    Component Value Date/Time   COLORURINE YELLOW 09/29/2015 2051   APPEARANCEUR CLOUDY (A) 09/29/2015 2051   LABSPEC 1.017 09/29/2015 2051   PHURINE 5.5 09/29/2015 2051   [place] 09/29/2015 2051   HGBUR NEGATIVE 09/29/2015 2051    [place] 09/29/2015 2051   [place] 09/29/2015 2051   PROTEINUR 100 (A) 09/29/2015 2051   UROBILINOGEN 0.2 07/06/2011 2116   NITRITE POSITIVE (A) 09/29/2015 2051   LEUKOCYTESUR MODERATE (A) 09/29/2015 2051   Sepsis Labs: !!!!!!!!!!!!!!!!!!!!!!!!!!!!!!!!!!!!!!!!!!!! @LABRCNTIP (procalcitonin:4,lacticidven:4) )No results found for this or any previous visit (from the past 240 hour(s)).   Radiological Exams on Admission: Ct Knee Right Wo Contrast  Result Date: 05/08/2016 CLINICAL DATA:  Right knee pain EXAM: CT OF THE RIGHT KNEE WITHOUT CONTRAST TECHNIQUE: Multidetector CT imaging of the RIGHT knee was performed according to the standard protocol. Multiplanar CT image reconstructions were also generated. COMPARISON:  None. FINDINGS: Bones/Joint/Cartilage Comminuted and depressed lateral tibial plateau fracture. 10 mm of depression of the articular surface. Area of depression measures 2.5 x 2.3 cm. Severe osteopenia. No other fracture or dislocation. Mild osteoarthritis of the medial femorotibial compartment. Large joint effusion. Small Baker cyst. Ligaments Suboptimally assessed by CT. Muscles and Tendons Muscles are normal. No significant muscle atrophy. Intact quadriceps tendon and patellar tendon. Soft tissues No fluid collection or hematoma. No soft tissue mass. There is peripheral vascular atherosclerotic disease. IMPRESSION: 1. Comminuted and depressed lateral tibial plateau fracture most consistent with Schatzker II classification. Electronically Signed   By: Kathreen Devoid   On: 05/08/2016 10:00   Dg Knee Complete 4 Views Right  Result Date: 05/08/2016 CLINICAL DATA:  Post fall stepping through a door at church yesterday. EXAM: RIGHT KNEE - COMPLETE 4+ VIEW COMPARISON:  Right hip radiographs-earlier same day FINDINGS: Osteopenia. There is a potential minimally displaced fracture involving the weight-bearing surface of the lateral aspects of the tibial plateau with  associated small knee joint effusion. No definitive evidence of lipohemarthrosis. Mild tricompartmental degenerative change of the knee with joint space loss, subchondral sclerosis osteophytosis. Query minimal amount of chondrocalcinosis within the medial and lateral joint spaces. Vascular calcifications.  No radiopaque foreign body. IMPRESSION: Osteopenia with potential minimally displaced fracture involving the lateral weight-bearing aspect of the tibial plateau. Correlation for tenderness at this location is recommended. Further evaluation with knee CT could be performed as indicated. Electronically Signed   By: Sandi Mariscal M.D.   On: 05/08/2016 08:33   Dg Hip Unilat W Or Wo Pelvis 2-3 Views Right  Result Date: 05/08/2016 CLINICAL DATA:  Post fall  stepping through a door at church yesterday. EXAM: DG HIP (WITH OR WITHOUT PELVIS) 2-3V RIGHT COMPARISON:  None. FINDINGS: No fracture or dislocation. Right hip joint spaces are preserved. No evidence avascular necrosis. Limited visualization of the adjacent pelvis is negative for displaced fracture. Moderate severe multilevel lumbar spine DDD is suspected though incompletely evaluated. Several phleboliths overlie the lower pelvis bilaterally. Suspected bone island overlies the cranial aspect of the left ilium, incompletely evaluated. Vascular calcifications. No radiopaque foreign body. IMPRESSION: 1. No displaced right-sided rib fracture. 2. Moderate severe multilevel lumbar spine DDD is suspected though incompletely evaluated. Electronically Signed   By: Sandi Mariscal M.D.   On: 05/08/2016 08:34    EKG: not done  Assessment/Plan Principal Problem:   Tibial plateau fracture Active Problems:   HTN (hypertension)   Hyperlipidemia   Diabetes mellitus (HCC)   Anemia in chronic kidney disease   Tibial plateau fracture - Orthopedics consulted - Dr. Tamera Punt to see at [facility] - Transferring to [facility] - NPO - holding anticoagulation until ortho  sees patient and decides on treatment course - PT/OT orders placed to see patient - may need SNF at time of discharge so consulted SW  HTN - will continue home medications tomorrow  Hyperlipidemia   H/o VTach - patient on amiodarone at home - will resume  Hypothyroidism - continue synthroid  Diabetes mellitus - holding oral glycemic agents - will start SSI sensitve  Anemia of chronic disease - H/H appears stable from previous values - repeat CBC in am  CKD - baseline creatinine appears to be between 1.9-2.1 - will repeat BMP in am   DVT prophylaxis: holding until ortho sees patient Code Status: Full code  Family Communication: Discussed with patient's daughter and her husband Disposition Plan: possibly SNF pending course of treatment and patient's recovery  Consults called: Dr. Tamera Punt with ortho called by EDP Admission status:  Inpatient to med surg   Loretha Stapler MD [facility] Pager [phone]  If 7PM-7AM, please contact night-coverage [email or web address] Password [email or web address]  05/08/2016, 1:55 PM

## 2016-05-08 NOTE — Progress Notes (Signed)
[facility] ED CM noted pt with fall and injury  Cm spoke with pt, husband and female visitor Pt confirms having a walker and cane at home  Provided pt a list of [place] home health agencies to review for her choice of agency to assist with home health services or DME as needed Pt reports she is going to "taken to [facility]" referring to [place] for procedure Discussed with pt she may be asked later for her choice of an agency to assist with home health services or DME as needed Pt voiced understanding and appreciation of information discussed and resources provided

## 2016-05-08 NOTE — ED Notes (Signed)
Pt does not want ice pack at present time.

## 2016-05-08 NOTE — ED Notes (Signed)
Patient transported to X-ray 

## 2016-05-08 NOTE — ED Triage Notes (Signed)
Pt brought in by EMS after she had a fall yesterday at church  Pt was assisted up and to home  Pt states this morning the pain is worse and she is unable to ambulate  EMS reports pt was able to ambulate with her walker  Pt has some swelling noted to her right knee  No bruising noted

## 2016-05-08 NOTE — ED Notes (Signed)
Set ice pack at bedside with family. Pt to apply after return from CT.

## 2016-05-08 NOTE — Progress Notes (Signed)
Dr. Tamera Punt consulted with Dr. Marcelino Scot who recommends nonoperative treatment. Therefore, NPO order discontinued. Will put in order for [employer] hinged knee brace allowing full ROM. Continue NWB, Continue PT to work on transfers and NWB ambulation in knee brace. Likely will need continued rehab in SNF. Will continue to follow and update family tomorrow.

## 2016-05-09 ENCOUNTER — Encounter (HOSPITAL_COMMUNITY): Payer: Self-pay | Admitting: General Practice

## 2016-05-09 DIAGNOSIS — S82141A Displaced bicondylar fracture of right tibia, initial encounter for closed fracture: Principal | ICD-10-CM

## 2016-05-09 LAB — BASIC METABOLIC PANEL
ANION GAP: 9 (ref 5–15)
BUN: 26 mg/dL — AB (ref 4–21)
BUN: 26 mg/dL — AB (ref 6–20)
CO2: 20 mmol/L — ABNORMAL LOW (ref 22–32)
Calcium: 8.8 mg/dL — ABNORMAL LOW (ref 8.9–10.3)
Chloride: 113 mmol/L — ABNORMAL HIGH (ref 101–111)
Creatinine, Ser: 2.03 mg/dL — ABNORMAL HIGH (ref 0.44–1.00)
Creatinine: 2 mg/dL — AB (ref 0.5–1.1)
GFR, EST AFRICAN AMERICAN: 25 mL/min — AB (ref >60)
GFR, EST NON AFRICAN AMERICAN: 21 mL/min — AB (ref >60)
GLUCOSE: 85 mg/dL
Glucose, Bld: 85 mg/dL (ref 65–99)
POTASSIUM: 4.3 mmol/L (ref 3.5–5.1)
Potassium: 4.3 mmol/L (ref 3.4–5.3)
SODIUM: 142 mmol/L (ref 137–147)
Sodium: 142 mmol/L (ref 135–145)

## 2016-05-09 LAB — CBC
HEMATOCRIT: 29.7 % — AB (ref 36.0–46.0)
HEMOGLOBIN: 9.5 g/dL — AB (ref 12.0–15.0)
MCH: 28.4 pg (ref 26.0–34.0)
MCHC: 32 g/dL (ref 30.0–36.0)
MCV: 88.7 fL (ref 78.0–100.0)
Platelets: 152 10*3/uL (ref 150–400)
RBC: 3.35 MIL/uL — AB (ref 3.87–5.11)
RDW: 17.7 % — AB (ref 11.5–15.5)
WBC: 6.2 10*3/uL (ref 4.0–10.5)

## 2016-05-09 LAB — CBC AND DIFFERENTIAL
HEMATOCRIT: 30 % — AB (ref 36–46)
Hemoglobin: 9.5 g/dL — AB (ref 12.0–16.0)
Platelets: 152 10*3/uL (ref 150–399)
WBC: 6.2 10^3/mL

## 2016-05-09 LAB — GLUCOSE, CAPILLARY
GLUCOSE-CAPILLARY: 90 mg/dL (ref 65–99)
GLUCOSE-CAPILLARY: 136 mg/dL — AB (ref 65–99)
GLUCOSE-CAPILLARY: 116 mg/dL — AB (ref 65–99)
Glucose-Capillary: 120 mg/dL — ABNORMAL HIGH (ref 65–99)
Glucose-Capillary: 93 mg/dL (ref 65–99)

## 2016-05-09 MED ORDER — CEFAZOLIN SODIUM-DEXTROSE 2-4 GM/100ML-% IV SOLN
2.0000 g | Freq: Once | INTRAVENOUS | Status: AC
  Administered 2016-05-09: 2 g via INTRAVENOUS
  Filled 2016-05-09: qty 100

## 2016-05-09 MED ORDER — OXYCODONE-ACETAMINOPHEN 5-325 MG PO TABS: 1.0000 | ORAL_TABLET | ORAL | Status: DC | PRN

## 2016-05-09 MED ORDER — MORPHINE SULFATE (PF) 2 MG/ML IV SOLN: 2.0000 mg | INTRAVENOUS | Status: DC | PRN

## 2016-05-09 NOTE — Care Management Note (Signed)
Case Management Note  Patient Details  Name: AZZURE GARABEDIAN MRN: [id number] Date of Birth: 03-01-1929  Subjective/Objective:                    Action/Plan:  Explained observation status to patient, her husband , her daughter and son , and other visitors at bedside.   Provided list of home health agencies , patient and family would like [facility] , referral made. They would like to speak directly to [place] . Jermain with [facility] will come see patient and family.  Ordered wheel chair and 3 in 1 .   Md working on pain management . Patient states if she moves her leg she has pain, has only been up one time . Probable discharge tomorrow per MD .   Family will go to patient's home and rearrange furniture for wheel chair  Offered ambulance transportation home at time of discharge . Family will decide at discharge.  Expected Discharge Date:                  Expected Discharge Plan:  [place]  In-House Referral:     Discharge planning Services  CM Consult  Post Acute Care Choice:    Choice offered to:  Patient  DME Arranged:  3-N-1, Wheelchair manual DME Agency:  [place]:  PT, OT, Nurse's Aide [place] Agency:  [place]  Status of Service:  In process, will continue to follow  If discussed at Long Length of Stay Meetings, dates discussed:    Additional Comments:  Marilu Favre, RN 05/09/2016, 12:11 PM

## 2016-05-09 NOTE — Progress Notes (Signed)
   PATIENT ID: Leslie Coleman       Subjective: Spoke with the patient and her family.  They are concerned about her ability to ambulate safely and feel she would like put weight down on the this leg using a walker.  Objective:  Vitals:   05/08/16 2330 05/09/16 0630  BP: 139/67 104/60  Pulse: 75 79  Resp:  16  Temp:  98.6 F (37 C)     RLE brace fitting well. Skin intact with mild swelling. NVID  Labs:   Recent Labs  05/08/16 1141 05/09/16 0750  HGB 9.5* 9.5*   Recent Labs  05/08/16 1141 05/09/16 0750  WBC 8.2 6.2  RBC 3.25* 3.35*  HCT 28.4* 29.7*  PLT 151 152   Recent Labs  05/08/16 1141 05/09/16 0750  NA 142 142  K 3.9 4.3  CL 113* 113*  CO2 20* 20*  BUN 28* 26*  CREATININE 1.89* 2.03*  GLUCOSE 127* 85  CALCIUM 8.8* 8.8*    Assessment and Plan: R depressed lateral plateau fracture Spoke with family at length.  After a long discussion about patients ability, goals, available help etc, I do feel that restoring the joint line and supporting with a plate and screws is going to be her best option. Likely will allow earlier mobilization and protection against weight bearing that may not be avoidable.  Pt and family in agreement. Spoke with Dr. [name] (Patient has prior relationship with [facility]) who will do surgery tomorrow. NPO p midnight

## 2016-05-09 NOTE — Progress Notes (Signed)
   PATIENT ID: Leslie Coleman       Subjective: Doing well, minimal pain at rest. Had not yet gotten up with PT when we saw her this morning. About to be fitted by for the hinged knee brace.  Objective:  Vitals:   05/08/16 2330 05/09/16 0630  BP: 139/67 104/60  Pulse: 75 79  Resp:  16  Temp:  98.6 F (37 C)     R knee with min swelling, no large effusion Wiggles toes, distally NVI  Labs:   Recent Labs  05/08/16 1141 05/09/16 0750  HGB 9.5* 9.5*   Recent Labs  05/08/16 1141 05/09/16 0750  WBC 8.2 6.2  RBC 3.25* 3.35*  HCT 28.4* 29.7*  PLT 151 152   Recent Labs  05/08/16 1141 05/09/16 0750  NA 142 142  K 3.9 4.3  CL 113* 113*  CO2 20* 20*  BUN 28* 26*  CREATININE 1.89* 2.03*  GLUCOSE 127* 85  CALCIUM 8.8* 8.8*    Assessment and Plan: 1 day s/p admission for minimally displaced right tibial plateau fracture sustained 05/07/16 treating nonoperatively NWB in a hinged knee brace with full ROM Up with PT today- recommend SNF- per medicare requirements does not need inpatient status that would qualify for a SNF and will need to be d/c home with home health, per social work Fu with Dr. Tamera Punt in 2 weeks at [facility]   VTE proph: per primary team

## 2016-05-09 NOTE — Progress Notes (Signed)
Orthopedic Tech Progress Note Patient Details:  Leslie Coleman September 03, 1928 [id number]  Patient ID: Leslie Coleman, female   DOB: September 08, 1928, 82 y.o.   MRN: [id number]   [name] 05/09/2016, 9:25 AM Called in [employer] brace order; spoke with Colletta [place]

## 2016-05-09 NOTE — Care Management CC44 (Signed)
Condition Code 44 Documentation Completed  Patient Details  Name: NEETA STOREY MRN: [id number] Date of Birth: [birth year]   Condition Code 44 given:   yes Patient signature on Condition Code 44 notice:   yes husband  Documentation of 2 MD's agreement:   yes Code 44 added to claim:   yes   Code 64 explained to patient, her husband, daughter and son. Patient asked her husband to sign. Marilu Favre, RN 05/09/2016, 12:22 PM

## 2016-05-09 NOTE — Progress Notes (Signed)
LCSW has met with family along with CM to discuss discharge planning. Patient course of treatment started at [facility] where she was in the ED for over 12 hours per family report. Patient and family report that they were seen by an ortho doc at [facility] but he reported he did not operate on knees, but rather shoulders and that patient would be seen by a MD at [facility] for recommendations and plan of care.  Family reports they have yet to see a doctor to explain plan of care and don't understand why patient does not meet inpatient criteria for insurance to pay for SNF.  LCSW explained Medicare laws and requirements for Medicare to pay for SNF/rehab and patient at this time is in observation status, thus if going to SNF, patient will have to pay privately.    LCSW has given quotes for facilities per day cost in which patient and husband report they are unable to pay out of pocket.  Patient has multiple family members in the room who report they are willing to help patient at home and husband will be at home.  Patient reports ultimately she wants to go home and open to home health as an option.  Will follow up on MD regarding course of plan and treatment and will continue to assist with disposition and needs.  Lane Hacker, MSW Clinical Social Work: [employer] Coverage for :  [phone]

## 2016-05-09 NOTE — Evaluation (Signed)
Physical Therapy Evaluation Patient Details Name: Leslie Coleman MRN: [id number] DOB: 30-Nov-1928 Today's Date: 05/09/2016   History of Present Illness  82 yo female admitted on 05/08/16 following a fall [place] [employer] on 05[place]07/16 resulting in a right tibial plateau fracture. PMH significant for HTN, SM, anemia, hypothyroid.   Clinical Impression  Pt presents following a fall resulting in right tibial plateau fracture that is non-surgical at this time. According to chart, pt is to be fitted with hinged brace to assist with knee mobility once discharged. Prior to admission, pt lived with her 104 yo husband in a single level home with a ramp leading into home. Husband is mobile, but is not able to assist pt if she does return home. Pt requires below assistance (Mod-Max) to perform mobility which would make it very difficult for her to return home without 24hr assistance at this time. Pt has a difficult time maintaining NWB on RLE with transfers and gait which will make it unsafe for her to return home without 24hr assistance.  Pt's home is cluttered, per family, and is not WC accessible which would make it difficult for client to access bathroom or her bedroom once returning home. Pt will benefit from SNF placement after discharge in order to maximize her functional outcomes. Pt will benefit from continuing to be seen acutely in order to address below deficits in order to assist with smooth transition to next venue of care.     Follow Up Recommendations SNF    Equipment Recommendations  Wheelchair (measurements PT);Wheelchair cushion (measurements PT);3in1 (PT)    Recommendations for Other Services       Precautions / Restrictions Precautions Precautions: Fall Precaution Comments: pt had a fall at her church on 05/07/16 Required Braces or Orthoses: Knee Immobilizer - Right Knee Immobilizer - Right: On at all times Restrictions Weight Bearing Restrictions: Yes RLE Weight Bearing: Non weight bearing       Mobility  Bed Mobility Overal bed mobility: Needs Assistance Bed Mobility: Supine to Sit     Supine to sit: Mod assist     General bed mobility comments: Mod A to bring RLE EOB and to assist with bringing trunk upright   Transfers Overall transfer level: Needs assistance Equipment used: Rolling walker (2 wheeled) Transfers: Sit to/from [facility] Sit to Stand: Mod assist Stand pivot transfers: Mod assist       General transfer comment: Mod a to stand from EOB to RW and max cues to maintain NWB on RLE once standing. pt has diffculty keeping RLE off the floor once standing and with stand pivot transfer. PT attempted to hold RLE up for pt, but she requires Max A for this movement.   Ambulation/Gait                Stairs            Wheelchair Mobility    Modified Rankin (Stroke Patients Only)       Balance Overall balance assessment: Needs assistance Sitting-balance support: Feet supported;No upper extremity supported Sitting balance-Leahy Scale: Fair Sitting balance - Comments: sitting EOB with no back support   Standing balance support: Bilateral upper extremity supported Standing balance-Leahy Scale: Zero Standing balance comment: Relies on Rw and therapist assistance to maintain standing balance                             Pertinent Vitals/Pain Pain Assessment: No/denies pain    Home  Living Family/patient expects to be discharged to:: Private residence Living Arrangements: Spouse/significant other Available Help at Discharge: Family;Available PRN/intermittently Type of Home: House Home Access: Ramped entrance     Home Layout: One level Home Equipment: Walker - 2 wheels;Cane - single point      Prior Function Level of Independence: Independent with assistive device(s)         Comments: was independent getting around with walker in the house and was cooking and cleaning     Hand Dominance   Dominant  Hand: Right    Extremity/Trunk Assessment   Upper Extremity Assessment Upper Extremity Assessment: Defer to OT evaluation    Lower Extremity Assessment Lower Extremity Assessment: RLE deficits/detail RLE Deficits / Details: pt with at least 3/5 ankle and unable to fully assess knee and hip.       Communication   Communication: No difficulties  Cognition Arousal/Alertness: Awake/alert Behavior During Therapy: WFL for tasks assessed/performed Overall Cognitive Status: Within Functional Limits for tasks assessed                      General Comments      Exercises     Assessment/Plan    PT Assessment Patient needs continued PT services  PT Problem List Decreased strength;Decreased range of motion;Decreased activity tolerance;Decreased balance;Decreased mobility;Decreased knowledge of use of DME;Decreased knowledge of precautions          PT Treatment Interventions DME instruction;Gait training;Stair training;Functional mobility training;Therapeutic activities;Therapeutic exercise;Balance training;Patient/family education    PT Goals (Current goals can be found in the Care Plan section)  Acute Rehab PT Goals Patient Stated Goal: to go home and not to rehab PT Goal Formulation: With patient/family Time For Goal Achievement: 05/16/16 Potential to Achieve Goals: Good    Frequency Min 3X/week   Barriers to discharge Decreased caregiver support Pt will require Mod-Max a for transfers and gait onces returning home    Co-evaluation               End of Session Equipment Utilized During Treatment: Gait belt;Right knee immobilizer Activity Tolerance: Patient tolerated treatment well;Patient limited by fatigue Patient left: in chair;with call bell/phone within reach;with family/visitor present Nurse Communication: Mobility status    Functional Assessment Tool Used: clinical judgement Functional Limitation: Mobility: Walking and moving around Mobility:  Walking and Moving Around Current Status [phone]): At least 40 percent but less than 60 percent impaired, limited or restricted Mobility: Walking and Moving Around Goal Status [phone]): At least 20 percent but less than 40 percent impaired, limited or restricted    Time: 3086-5784 PT Time Calculation (min) (ACUTE ONLY): 44 min   Charges:   PT Evaluation $PT Eval Moderate Complexity: 1 Procedure PT Treatments $Therapeutic Activity: 23-37 mins   PT G Codes:   PT G-Codes **NOT FOR INPATIENT CLASS** Functional Assessment Tool Used: clinical judgement Functional Limitation: Mobility: Walking and moving around Mobility: Walking and Moving Around Current Status (O9629): At least 40 percent but less than 60 percent impaired, limited or restricted Mobility: Walking and Moving Around Goal Status [phone]): At least 20 percent but less than 40 percent impaired, limited or restricted    Scheryl Marten PT, DPT  [phone]  05/09/2016, 10:55 AM

## 2016-05-09 NOTE — Progress Notes (Signed)
PROGRESS NOTE    Leslie Coleman  IPJ:[id number] DOB: [birth year] DOA: 05/08/2016 PCP: [name], MD    Brief Narrative:   53 ? htn hypothroid Dm ty ii Anemia  Admit  With Lat tibial # c schatzker II classif Ortho consulted   Assessment & Plan:   Principal Problem:   Tibial plateau fracture Active Problems:   HTN (hypertension)   Hyperlipidemia   Diabetes mellitus (HCC)   Anemia in chronic kidney disease   R Tibial plateau #-long discussion with patient/family and ortho-looks like might benefit form surgery and is scheudled for surgery 12/20 under Dr. [name].  Use percocet, and IV morphine for uncontrolled pain Htn-cont amlodipine 10 daily, Avapro/HCTZ SVT? On amiodarone-has never been told any irreg heart beats.  Clear from my perspective for elective surgery if prn DM ty II-cont SSI, sugars ranging 90-130 CKD stg 3-4 with mild met acidosis-monitor while on ARB/Thiazide-labs am Anemia chronic disease-normocytic-iron levels as OP hld-pravastatin 20 qhs Hypothyroidism-cont synthroid 100 mcg daily-tsh 4-6 wk    DVT prophylaxis: foot pump Code Status: full Family Communication: long 20 min discussion with patient and family members Disposition Plan: unclear   Consultants:   ortho  Procedures:   ct knee  Antimicrobials:   none    Subjective:  Awake alert in some pain Immobilizer on  Objective: Vitals:   05/08/16 1837 05/08/16 2330 05/09/16 0630 05/09/16 1406  BP: 160/79 139/67 104/60 (!) 136/59  Pulse: 79 75 79 84  Resp: 16  16 18   Temp: 98 F (36.7 C)  98.6 F (37 C) 98.2 F (36.8 C)  TempSrc: Oral  Oral Oral  SpO2: 90% 93% 96% 99%    Intake/Output Summary (Last 24 hours) at 05/09/16 1423 Last data filed at 05/09/16 0900  Gross per 24 hour  Intake              120 ml  Output              300 ml  Net             -180 ml   There were no vitals filed for this visit.  Examination:  General exam:  calm and comfortable  Respiratory  system: Clear Respiratory effort normal. Cardiovascular system: S1 & S2 heard, RRR. No JVDNo pedal edema. Gastrointestinal system: Abdomen is nondistended, soft and nontender. No organomegaly-obese Central nervous system: Alert and oriented. No focal neurological deficits. Extremities: Symmetric 5 x 5 power. Skin: No rashes, lesions or ulcers-RLE immobilized in brace Psychiatry: Judgement and insight appear normal. Mood & affect appropriate.     Data Reviewed: I have personally reviewed following labs and imaging studies  CBC:  Recent Labs Lab 05/08/16 1141 05/09/16 0750  WBC 8.2 6.2  NEUTROABS 5.6  --   HGB 9.5* 9.5*  HCT 28.4* 29.7*  MCV 87.4 88.7  PLT 151 734   Basic Metabolic Panel:  Recent Labs Lab 05/08/16 1141 05/09/16 0750  NA 142 142  K 3.9 4.3  CL 113* 113*  CO2 20* 20*  GLUCOSE 127* 85  BUN 28* 26*  CREATININE 1.89* 2.03*  CALCIUM 8.8* 8.8*   GFR: CrCl cannot be calculated (Unknown ideal weight.). Liver Function Tests: No results for input(s): AST, ALT, ALKPHOS, BILITOT, PROT, ALBUMIN in the last 168 hours. No results for input(s): LIPASE, AMYLASE in the last 168 hours. No results for input(s): AMMONIA in the last 168 hours. Coagulation Profile: No results for input(s): INR, PROTIME in the last 168 hours. Cardiac Enzymes:  No results for input(s): CKTOTAL, CKMB, CKMBINDEX, TROPONINI in the last 168 hours. BNP (last 3 results) No results for input(s): PROBNP in the last 8760 hours. HbA1C: No results for input(s): HGBA1C in the last 72 hours. CBG:  Recent Labs Lab 05/09/16 0009 05/09/16 0757 05/09/16 1139  GLUCAP 93 90 136*   Lipid Profile: No results for input(s): CHOL, HDL, LDLCALC, TRIG, CHOLHDL, LDLDIRECT in the last 72 hours. Thyroid Function Tests: No results for input(s): TSH, T4TOTAL, FREET4, T3FREE, THYROIDAB in the last 72 hours. Anemia Panel: No results for input(s): VITAMINB12, FOLATE, FERRITIN, TIBC, IRON, RETICCTPCT in the last  72 hours. Sepsis Labs: No results for input(s): PROCALCITON, LATICACIDVEN in the last 168 hours.  No results found for this or any previous visit (from the past 240 hour(s)).       Radiology Studies: Ct Knee Right Wo Contrast  Result Date: 05/08/2016 CLINICAL DATA:  Right knee pain EXAM: CT OF THE RIGHT KNEE WITHOUT CONTRAST TECHNIQUE: Multidetector CT imaging of the RIGHT knee was performed according to the standard protocol. Multiplanar CT image reconstructions were also generated. COMPARISON:  None. FINDINGS: Bones/Joint/Cartilage Comminuted and depressed lateral tibial plateau fracture. 10 mm of depression of the articular surface. Area of depression measures 2.5 x 2.3 cm. Severe osteopenia. No other fracture or dislocation. Mild osteoarthritis of the medial femorotibial compartment. Large joint effusion. Small Baker cyst. Ligaments Suboptimally assessed by CT. Muscles and Tendons Muscles are normal. No significant muscle atrophy. Intact quadriceps tendon and patellar tendon. Soft tissues No fluid collection or hematoma. No soft tissue mass. There is peripheral vascular atherosclerotic disease. IMPRESSION: 1. Comminuted and depressed lateral tibial plateau fracture most consistent with Schatzker II classification. Electronically Signed   By: Kathreen Devoid   On: 05/08/2016 10:00   Dg Knee Complete 4 Views Right  Result Date: 05/08/2016 CLINICAL DATA:  Post fall stepping through a door at church yesterday. EXAM: RIGHT KNEE - COMPLETE 4+ VIEW COMPARISON:  Right hip radiographs-earlier same day FINDINGS: Osteopenia. There is a potential minimally displaced fracture involving the weight-bearing surface of the lateral aspects of the tibial plateau with associated small knee joint effusion. No definitive evidence of lipohemarthrosis. Mild tricompartmental degenerative change of the knee with joint space loss, subchondral sclerosis osteophytosis. Query minimal amount of chondrocalcinosis within the  medial and lateral joint spaces. Vascular calcifications.  No radiopaque foreign body. IMPRESSION: Osteopenia with potential minimally displaced fracture involving the lateral weight-bearing aspect of the tibial plateau. Correlation for tenderness at this location is recommended. Further evaluation with knee CT could be performed as indicated. Electronically Signed   By: Sandi Mariscal M.D.   On: 05/08/2016 08:33   Dg Hip Unilat W Or Wo Pelvis 2-3 Views Right  Result Date: 05/08/2016 CLINICAL DATA:  Post fall stepping through a door at church yesterday. EXAM: DG HIP (WITH OR WITHOUT PELVIS) 2-3V RIGHT COMPARISON:  None. FINDINGS: No fracture or dislocation. Right hip joint spaces are preserved. No evidence avascular necrosis. Limited visualization of the adjacent pelvis is negative for displaced fracture. Moderate severe multilevel lumbar spine DDD is suspected though incompletely evaluated. Several phleboliths overlie the lower pelvis bilaterally. Suspected bone island overlies the cranial aspect of the left ilium, incompletely evaluated. Vascular calcifications. No radiopaque foreign body. IMPRESSION: 1. No displaced right-sided rib fracture. 2. Moderate severe multilevel lumbar spine DDD is suspected though incompletely evaluated. Electronically Signed   By: Sandi Mariscal M.D.   On: 05/08/2016 08:34        Scheduled Meds: .  amiodarone  100 mg Oral q morning - 10a  . amLODipine  10 mg Oral QHS  . hydrALAZINE  50 mg Oral BID  . irbesartan  300 mg Oral Daily   And  . hydrochlorothiazide  25 mg Oral Daily  . insulin aspart  0-5 Units Subcutaneous QHS  . insulin aspart  0-9 Units Subcutaneous TID WC  . levothyroxine  100 mcg Oral QAC breakfast  . multivitamin with minerals  1 tablet Oral Daily  . potassium chloride  10 mEq Oral q morning - 10a  . pravastatin  20 mg Oral QHS   Continuous Infusions:   LOS: 1 day    Time spent: 45  20 min face to face time    Nita Sells,  MD [facility] Pager [phone]  If 7PM-7AM, please contact night-coverage [email or web address] Password [email or web address] 05/09/2016, 2:23 PM

## 2016-05-09 NOTE — Consult Note (Signed)
ORTHOPAEDIC CONSULTATION  REQUESTING PHYSICIAN: Nita Sells, MD  Chief Complaint: Right tibial plateau fx  HPI: Leslie Coleman is a 82 y.o. female who presents with right tib plateau fx s/p fall at church 2 days ago landing on right knee.  Had immediate pain and inability to bear weight.  She lives at home with husband at baseline and walks with walker or cane.  Pain is severe with weight bearing, doesn't radiate.  She has h/o HTN and DM  Past Medical History:  Diagnosis Date  . Abnormal PFTs (pulmonary function tests) 2008   No obstruction noted. Has been followed clinically.  . Anemia    treated with Procrit  . Fall from slip, trip, or stumble 05/07/2016   fell at church resulting in right tibial plateau fracture  . Gout   . High risk medication use    on amiodarone.  Mar[place] Kitchen HTN (hypertension)    Last echo in 2004; EF normal  . Hyperlipidemia   . Hypothyroidism    on replacement  . Obesity   . Type II diabetes mellitus ([place])   . V-tach [facility])    remote VT, treated initially with procainamide, on amiodarone since 2008   Past Surgical History:  Procedure Laterality Date  . CARDIOVASCULAR STRESS TEST  07/07/2003   EF 70%. NO EVIDENCE OF ISCHEMIA  . CARDIOVERSION  1978  . CATARACT EXTRACTION, BILATERAL Bilateral   . CESAREAN SECTION     x 3  . US ECHOCARDIOGRAPHY  12/29/2002   EF 60-65%  . VAGINAL HYSTERECTOMY     Social History   Social History  . Marital status: Married    Spouse name: N/A  . Number of children: N/A  . Years of education: N/A   Social History Main Topics  . Smoking status: Never Smoker  . Smokeless tobacco: Never Used  . Alcohol use No  . Drug use: No  . Sexual activity: Not Currently   Other Topics Concern  . None   Social History Narrative   Lives with spouse in [place]   Family History  Problem Relation Age of Onset  . Heart disease Mother   . Heart disease Father    - negative except otherwise stated in the family history  section No Known Allergies Prior to Admission medications   Medication Sig Start Date End Date Taking? Authorizing Provider  acetaminophen (TYLENOL) 500 MG tablet Take 500 mg by mouth at bedtime.    Yes Historical Provider, MD  amiodarone (PACERONE) 100 MG tablet Take 1 tablet (100 mg total) by mouth every morning. 11/01/15  Yes Burtis Junes, NP  amLODipine (NORVASC) 10 MG tablet Take 10 mg by mouth at bedtime.   Yes Historical Provider, MD  Epoetin Alfa (PROCRIT IJ) Inject 10,000 Units as directed every 28 (twenty-eight) days. As directed every 4 weeks.   Yes Historical Provider, MD  glipiZIDE (GLUCOTROL XL) 5 MG 24 hr tablet Take 5 mg by mouth daily. 04/03/16  Yes Historical Provider, MD  hydrALAZINE (APRESOLINE) 50 MG tablet TAKE 1 TABLET BY MOUTH TWICE A DAY 04/24/16  Yes Thompson Grayer, MD  levothyroxine (SYNTHROID, LEVOTHROID) 100 MCG tablet Take 1 tablet (100 mcg total) by mouth daily before breakfast. 11/02/15  Yes Burtis Junes, NP  Multiple Vitamin (MULTIVITAMIN WITH MINERALS) TABS tablet Take 1 tablet by mouth daily.   Yes Historical Provider, MD  naproxen sodium (ANAPROX) 220 MG tablet Take 220 mg by mouth every morning.   Yes Historical Provider, MD  omega-3 acid ethyl esters (LOVAZA) 1 g capsule TAKE 2 CAPSULES BY MOUTH 2 TIMES DAILY. 08/11/15  Yes Burtis Junes, NP  potassium chloride (KLOR-CON 10) 10 MEQ tablet Take 1 tablet (10 mEq total) by mouth every morning. 02/25/16  Yes Burtis Junes, NP  pravastatin (PRAVACHOL) 20 MG tablet TAKE 1 TABLET BY MOUTH AT BEDTIME 08/24/15  Yes Burtis Junes, NP  telmisartan-hydrochlorothiazide (MICARDIS HCT) 80-25 MG tablet Take 1 tablet by mouth daily. 07/31/15  Yes Historical Provider, MD  allopurinol (ZYLOPRIM) 300 MG tablet TAKE 1 TABLET BY MOUTH EVERY DAY Patient not taking: Reported on 05/08/2016 05/12/15   Thompson Grayer, MD  oxymetazoline (AFRIN NASAL SPRAY) 0.05 % nasal spray Place 1 spray into both nostrils 2 (two) times daily as needed  (nosebleed, no more than 3 days). Patient not taking: Reported on 05/08/2016 12/26/14   Leo Grosser, MD   Ct Knee Right Wo Contrast  Result Date: 05/08/2016 CLINICAL DATA:  Right knee pain EXAM: CT OF THE RIGHT KNEE WITHOUT CONTRAST TECHNIQUE: Multidetector CT imaging of the RIGHT knee was performed according to the standard protocol. Multiplanar CT image reconstructions were also generated. COMPARISON:  None. FINDINGS: Bones/Joint/Cartilage Comminuted and depressed lateral tibial plateau fracture. 10 mm of depression of the articular surface. Area of depression measures 2.5 x 2.3 cm. Severe osteopenia. No other fracture or dislocation. Mild osteoarthritis of the medial femorotibial compartment. Large joint effusion. Small Baker cyst. Ligaments Suboptimally assessed by CT. Muscles and Tendons Muscles are normal. No significant muscle atrophy. Intact quadriceps tendon and patellar tendon. Soft tissues No fluid collection or hematoma. No soft tissue mass. There is peripheral vascular atherosclerotic disease. IMPRESSION: 1. Comminuted and depressed lateral tibial plateau fracture most consistent with Schatzker II classification. Electronically Signed   By: Kathreen Devoid   On: 05/08/2016 10:00   Dg Knee Complete 4 Views Right  Result Date: 05/08/2016 CLINICAL DATA:  Post fall stepping through a door at church yesterday. EXAM: RIGHT KNEE - COMPLETE 4+ VIEW COMPARISON:  Right hip radiographs-earlier same day FINDINGS: Osteopenia. There is a potential minimally displaced fracture involving the weight-bearing surface of the lateral aspects of the tibial plateau with associated small knee joint effusion. No definitive evidence of lipohemarthrosis. Mild tricompartmental degenerative change of the knee with joint space loss, subchondral sclerosis osteophytosis. Query minimal amount of chondrocalcinosis within the medial and lateral joint spaces. Vascular calcifications.  No radiopaque foreign body. IMPRESSION:  Osteopenia with potential minimally displaced fracture involving the lateral weight-bearing aspect of the tibial plateau. Correlation for tenderness at this location is recommended. Further evaluation with knee CT could be performed as indicated. Electronically Signed   By: Sandi Mariscal M.D.   On: 05/08/2016 08:33   Dg Hip Unilat W Or Wo Pelvis 2-3 Views Right  Result Date: 05/08/2016 CLINICAL DATA:  Post fall stepping through a door at church yesterday. EXAM: DG HIP (WITH OR WITHOUT PELVIS) 2-3V RIGHT COMPARISON:  None. FINDINGS: No fracture or dislocation. Right hip joint spaces are preserved. No evidence avascular necrosis. Limited visualization of the adjacent pelvis is negative for displaced fracture. Moderate severe multilevel lumbar spine DDD is suspected though incompletely evaluated. Several phleboliths overlie the lower pelvis bilaterally. Suspected bone island overlies the cranial aspect of the left ilium, incompletely evaluated. Vascular calcifications. No radiopaque foreign body. IMPRESSION: 1. No displaced right-sided rib fracture. 2. Moderate severe multilevel lumbar spine DDD is suspected though incompletely evaluated. Electronically Signed   By: Sandi Mariscal M.D.   On: 05/08/2016  08:34   - pertinent xrays, CT, MRI studies were reviewed and independently interpreted  Positive ROS: All other systems have been reviewed and were otherwise negative with the exception of those mentioned in the HPI and as above.  Physical Exam: General: Alert, no acute distress Cardiovascular: No pedal edema Respiratory: No cyanosis, no use of accessory musculature GI: No organomegaly, abdomen is soft and non-tender Skin: No lesions in the area of chief complaint Neurologic: Sensation intact distally Psychiatric: Patient is competent for consent with normal mood and affect Lymphatic: No axillary or cervical lymphadenopathy  MUSCULOSKELETAL:  - compartment soft - foot wwp, NVI   Assessment: Right  tibial plateau fx  Plan: - will plan for ORIF tomorrow, anticipated NWB for 6-8 weeks - will need SNF placement postop - NPo after midnight, consent obtained  Thank you for the consult and the opportunity to see Leslie Coleman Eduard Roux, MD [place] [phone] 5:50 PM

## 2016-05-10 ENCOUNTER — Inpatient Hospital Stay (HOSPITAL_COMMUNITY): Payer: Medicare Other

## 2016-05-10 ENCOUNTER — Inpatient Hospital Stay (HOSPITAL_COMMUNITY): Payer: Medicare Other | Admitting: Certified Registered Nurse Anesthetist

## 2016-05-10 ENCOUNTER — Encounter (HOSPITAL_COMMUNITY)
Admission: EM | Disposition: A | Discharge status: Skilled Nursing Facility | Payer: Self-pay | Source: Home / Self Care | Attending: Family Medicine

## 2016-05-10 HISTORY — PX: ORIF TIBIA PLATEAU: SHX2132

## 2016-05-10 LAB — COMPREHENSIVE METABOLIC PANEL
ALK PHOS: 62 U/L (ref 38–126)
ALT: 10 U/L — AB (ref 14–54)
AST: 19 U/L (ref 15–41)
Albumin: 2.3 g/dL — ABNORMAL LOW (ref 3.5–5.0)
Anion gap: 8 (ref 5–15)
BUN: 25 mg/dL — AB (ref 6–20)
CALCIUM: 8.4 mg/dL — AB (ref 8.9–10.3)
CHLORIDE: 110 mmol/L (ref 101–111)
CO2: 19 mmol/L — AB (ref 22–32)
CREATININE: 1.98 mg/dL — AB (ref 0.44–1.00)
GFR, EST AFRICAN AMERICAN: 25 mL/min — AB (ref >60)
GFR, EST NON AFRICAN AMERICAN: 22 mL/min — AB (ref >60)
Glucose, Bld: 113 mg/dL — ABNORMAL HIGH (ref 65–99)
Potassium: 4.2 mmol/L (ref 3.5–5.1)
SODIUM: 137 mmol/L (ref 135–145)
Total Bilirubin: 0.8 mg/dL (ref 0.3–1.2)
Total Protein: 5.3 g/dL — ABNORMAL LOW (ref 6.5–8.1)

## 2016-05-10 LAB — BASIC METABOLIC PANEL
BUN: 25 mg/dL — AB (ref 4–21)
CREATININE: 2 mg/dL — AB (ref 0.5–1.1)
GLUCOSE: 113 mg/dL
POTASSIUM: 4.2 mmol/L (ref 3.4–5.3)
Sodium: 137 mmol/L (ref 137–147)

## 2016-05-10 LAB — CBC
HCT: 27.2 % — ABNORMAL LOW (ref 36.0–46.0)
HEMOGLOBIN: 8.8 g/dL — AB (ref 12.0–15.0)
MCH: 28.6 pg (ref 26.0–34.0)
MCHC: 32.4 g/dL (ref 30.0–36.0)
MCV: 88.3 fL (ref 78.0–100.0)
PLATELETS: 139 10*3/uL — AB (ref 150–400)
RBC: 3.08 MIL/uL — AB (ref 3.87–5.11)
RDW: 17.5 % — ABNORMAL HIGH (ref 11.5–15.5)
WBC: 5.6 10*3/uL (ref 4.0–10.5)

## 2016-05-10 LAB — GLUCOSE, CAPILLARY
Glucose-Capillary: 116 mg/dL — ABNORMAL HIGH (ref 65–99)
Glucose-Capillary: 104 mg/dL — ABNORMAL HIGH (ref 65–99)
Glucose-Capillary: 92 mg/dL (ref 65–99)
Glucose-Capillary: 99 mg/dL (ref 65–99)

## 2016-05-10 LAB — CBC AND DIFFERENTIAL
HCT: 27 % — AB (ref 36–46)
Hemoglobin: 8.8 g/dL — AB (ref 12.0–16.0)
Platelets: 139 10*3/uL — AB (ref 150–399)
WBC: 5.6 10*3/mL

## 2016-05-10 LAB — SURGICAL PCR SCREEN
MRSA, PCR: NEGATIVE
STAPHYLOCOCCUS AUREUS: NEGATIVE

## 2016-05-10 SURGERY — OPEN REDUCTION INTERNAL FIXATION (ORIF) TIBIAL PLATEAU
Anesthesia: General | Site: Knee | Laterality: Right

## 2016-05-10 MED ORDER — ALBUMIN HUMAN 5 % IV SOLN
INTRAVENOUS | Status: DC | PRN
  Administered 2016-05-10: 17:00:00 via INTRAVENOUS

## 2016-05-10 MED ORDER — ENOXAPARIN SODIUM 40 MG/0.4ML ~~LOC~~ SOLN: 40.0000 mg | SUBCUTANEOUS | Status: DC

## 2016-05-10 MED ORDER — LACTATED RINGERS IV SOLN
INTRAVENOUS | Status: DC
  Administered 2016-05-10: 16:00:00 via INTRAVENOUS

## 2016-05-10 MED ORDER — METOCLOPRAMIDE HCL 5 MG/ML IJ SOLN: 5.0000 mg | Freq: Three times a day (TID) | INTRAMUSCULAR | Status: DC | PRN

## 2016-05-10 MED ORDER — FENTANYL CITRATE (PF) 100 MCG/2ML IJ SOLN: 25.0000 ug | INTRAMUSCULAR | Status: DC | PRN

## 2016-05-10 MED ORDER — ONDANSETRON HCL 4 MG/2ML IJ SOLN: 4.0000 mg | Freq: Four times a day (QID) | INTRAMUSCULAR | Status: DC | PRN

## 2016-05-10 MED ORDER — ONDANSETRON HCL 4 MG PO TABS
4.0000 mg | ORAL_TABLET | Freq: Four times a day (QID) | ORAL | Status: DC | PRN
  Administered 2016-05-11: 4 mg via ORAL
  Filled 2016-05-10: qty 1

## 2016-05-10 MED ORDER — OXYCODONE HCL 5 MG PO TABS
5.0000 mg | ORAL_TABLET | ORAL | Status: DC | PRN
  Administered 2016-05-10: 10 mg via ORAL

## 2016-05-10 MED ORDER — FENTANYL CITRATE (PF) 100 MCG/2ML IJ SOLN
INTRAMUSCULAR | Status: DC | PRN
  Administered 2016-05-10 (×2): 25 ug via INTRAVENOUS
  Administered 2016-05-10: 50 ug via INTRAVENOUS
  Administered 2016-05-10: 25 ug via INTRAVENOUS
  Administered 2016-05-10: 50 ug via INTRAVENOUS
  Administered 2016-05-10: 25 ug via INTRAVENOUS

## 2016-05-10 MED ORDER — ACETAMINOPHEN 650 MG RE SUPP: 650.0000 mg | Freq: Four times a day (QID) | RECTAL | Status: DC | PRN

## 2016-05-10 MED ORDER — HYDROCODONE-ACETAMINOPHEN 5-325 MG PO TABS
1.0000 | ORAL_TABLET | Freq: Four times a day (QID) | ORAL | Status: DC | PRN
  Administered 2016-05-11: 1 via ORAL
  Filled 2016-05-10: qty 1

## 2016-05-10 MED ORDER — MENTHOL 3 MG MT LOZG: 1.0000 | LOZENGE | OROMUCOSAL | Status: DC | PRN

## 2016-05-10 MED ORDER — FENTANYL CITRATE (PF) 100 MCG/2ML IJ SOLN
INTRAMUSCULAR | Status: AC
  Filled 2016-05-10: qty 2

## 2016-05-10 MED ORDER — ONDANSETRON HCL 4 MG/2ML IJ SOLN
INTRAMUSCULAR | Status: DC | PRN
  Administered 2016-05-10: 4 mg via INTRAVENOUS

## 2016-05-10 MED ORDER — MORPHINE SULFATE (PF) 2 MG/ML IV SOLN: 0.5000 mg | INTRAVENOUS | Status: DC | PRN

## 2016-05-10 MED ORDER — SUGAMMADEX SODIUM 200 MG/2ML IV SOLN
INTRAVENOUS | Status: DC | PRN
  Administered 2016-05-10 (×3): 50 mg via INTRAVENOUS

## 2016-05-10 MED ORDER — PROPOFOL 10 MG/ML IV BOLUS
INTRAVENOUS | Status: AC
Start: 2016-05-10 — End: 2016-05-10
  Filled 2016-05-10: qty 20

## 2016-05-10 MED ORDER — BUPIVACAINE HCL (PF) 0.25 % IJ SOLN
INTRAMUSCULAR | Status: AC
  Filled 2016-05-10: qty 30

## 2016-05-10 MED ORDER — SODIUM CHLORIDE 0.9 % IV SOLN
INTRAVENOUS | Status: DC
  Administered 2016-05-10: 23:00:00 via INTRAVENOUS

## 2016-05-10 MED ORDER — 0.9 % SODIUM CHLORIDE (POUR BTL) OPTIME
TOPICAL | Status: DC | PRN
  Administered 2016-05-10: 3000 mL

## 2016-05-10 MED ORDER — METOCLOPRAMIDE HCL 5 MG PO TABS: 5.0000 mg | ORAL_TABLET | Freq: Three times a day (TID) | ORAL | Status: DC | PRN

## 2016-05-10 MED ORDER — LACTATED RINGERS IV SOLN
INTRAVENOUS | Status: DC | PRN
  Administered 2016-05-10 (×2): via INTRAVENOUS

## 2016-05-10 MED ORDER — ACETAMINOPHEN 325 MG PO TABS
650.0000 mg | ORAL_TABLET | Freq: Four times a day (QID) | ORAL | Status: DC | PRN
Start: 2016-05-10 — End: 2016-05-10

## 2016-05-10 MED ORDER — LIDOCAINE 2% (20 MG/ML) 5 ML SYRINGE
INTRAMUSCULAR | Status: DC | PRN
  Administered 2016-05-10: 80 mg via INTRAVENOUS

## 2016-05-10 MED ORDER — METHOCARBAMOL 1000 MG/10ML IJ SOLN
500.0000 mg | Freq: Four times a day (QID) | INTRAMUSCULAR | Status: DC | PRN
  Filled 2016-05-10: qty 5

## 2016-05-10 MED ORDER — METHOCARBAMOL 500 MG PO TABS: 500.0000 mg | ORAL_TABLET | Freq: Four times a day (QID) | ORAL | Status: DC | PRN

## 2016-05-10 MED ORDER — ALUM & MAG HYDROXIDE-SIMETH 200-200-20 MG/5ML PO SUSP: 30.0000 mL | ORAL | Status: DC | PRN

## 2016-05-10 MED ORDER — OXYCODONE HCL 5 MG PO TABS
ORAL_TABLET | ORAL | Status: AC
  Filled 2016-05-10: qty 2

## 2016-05-10 MED ORDER — CEFAZOLIN SODIUM 1 G IJ SOLR
INTRAMUSCULAR | Status: AC
  Filled 2016-05-10: qty 20

## 2016-05-10 MED ORDER — PHENYLEPHRINE HCL 10 MG/ML IJ SOLN
INTRAVENOUS | Status: DC | PRN
  Administered 2016-05-10: 20 ug/min via INTRAVENOUS

## 2016-05-10 MED ORDER — ENOXAPARIN SODIUM 30 MG/0.3ML ~~LOC~~ SOLN
30.0000 mg | SUBCUTANEOUS | Status: DC
Start: 2016-05-11 — End: 2016-05-12
  Administered 2016-05-11 – 2016-05-12 (×2): 30 mg via SUBCUTANEOUS
  Filled 2016-05-10 (×2): qty 0.3

## 2016-05-10 MED ORDER — CEFAZOLIN SODIUM 1 G IJ SOLR
INTRAMUSCULAR | Status: DC | PRN
  Administered 2016-05-10: 2 g via INTRAMUSCULAR

## 2016-05-10 MED ORDER — CEFAZOLIN SODIUM-DEXTROSE 2-4 GM/100ML-% IV SOLN
2.0000 g | Freq: Two times a day (BID) | INTRAVENOUS | Status: AC
  Administered 2016-05-11 (×2): 2 g via INTRAVENOUS
  Filled 2016-05-10 (×2): qty 100

## 2016-05-10 MED ORDER — PHENOL 1.4 % MT LIQD: 1.0000 | OROMUCOSAL | Status: DC | PRN

## 2016-05-10 MED ORDER — FENTANYL CITRATE (PF) 100 MCG/2ML IJ SOLN
INTRAMUSCULAR | Status: AC
Start: 2016-05-10 — End: 2016-05-10
  Filled 2016-05-10: qty 2

## 2016-05-10 MED ORDER — PROPOFOL 10 MG/ML IV BOLUS
INTRAVENOUS | Status: DC | PRN
  Administered 2016-05-10: 80 mg via INTRAVENOUS

## 2016-05-10 MED ORDER — PHENYLEPHRINE 40 MCG/ML (10ML) SYRINGE FOR IV PUSH (FOR BLOOD PRESSURE SUPPORT)
PREFILLED_SYRINGE | INTRAVENOUS | Status: DC | PRN
Start: 2016-05-10 — End: 2016-05-10
  Administered 2016-05-10: 80 ug via INTRAVENOUS

## 2016-05-10 MED ORDER — ROCURONIUM BROMIDE 10 MG/ML (PF) SYRINGE
PREFILLED_SYRINGE | INTRAVENOUS | Status: DC | PRN
  Administered 2016-05-10: 30 mg via INTRAVENOUS

## 2016-05-10 SURGICAL SUPPLY — 71 items
BANDAGE ACE 6X5 VEL STRL LF (GAUZE/BANDAGES/DRESSINGS) ×3 IMPLANT
BANDAGE ESMARK 6X9 LF (GAUZE/BANDAGES/DRESSINGS) ×1 IMPLANT
BIT DRILL 2.7 QC 7.9IN LONG (BIT) ×3 IMPLANT
BIT DRILL QC 2.7 6.3IN  SHORT (BIT) ×2
BIT DRILL QC 2.7 6.3IN SHORT (BIT) ×1 IMPLANT
BLADE SAW SGTL 81X20 HD (BLADE) ×3 IMPLANT
BLADE SURG ROTATE 9660 (MISCELLANEOUS) IMPLANT
BNDG COHESIVE 6X5 TAN STRL LF (GAUZE/BANDAGES/DRESSINGS) ×3 IMPLANT
BNDG ELASTIC 6X10 VLCR STRL LF (GAUZE/BANDAGES/DRESSINGS) ×3 IMPLANT
BNDG ESMARK 6X9 LF (GAUZE/BANDAGES/DRESSINGS) ×3
BNDG GAUZE ELAST 4 BULKY (GAUZE/BANDAGES/DRESSINGS) ×3 IMPLANT
BRUSH SCRUB EZ PLAIN DRY (MISCELLANEOUS) ×6 IMPLANT
COVER SURGICAL LIGHT HANDLE (MISCELLANEOUS) ×3 IMPLANT
DRAPE C-ARM 42X72 X-RAY (DRAPES) ×3 IMPLANT
DRAPE C-ARMOR (DRAPES) ×3 IMPLANT
DRAPE IMP U-DRAPE 54X76 (DRAPES) ×6 IMPLANT
DRAPE INCISE IOBAN 66X45 STRL (DRAPES) ×3 IMPLANT
DRAPE ORTHO SPLIT 77X108 STRL (DRAPES) ×4
DRAPE PROXIMA HALF (DRAPES) ×3 IMPLANT
DRAPE SURG ORHT 6 SPLT 77X108 (DRAPES) ×2 IMPLANT
DRAPE U-SHAPE 47X51 STRL (DRAPES) ×3 IMPLANT
DURAPREP 26ML APPLICATOR (WOUND CARE) ×3 IMPLANT
ELECT CAUTERY BLADE 6.4 (BLADE) ×3 IMPLANT
ELECT REM PT RETURN 9FT ADLT (ELECTROSURGICAL) ×3
ELECTRODE REM PT RTRN 9FT ADLT (ELECTROSURGICAL) ×1 IMPLANT
FACESHIELD STD STERILE (MASK) ×3 IMPLANT
GAUZE XEROFORM 1X8 LF (GAUZE/BANDAGES/DRESSINGS) ×3 IMPLANT
GLOVE SKINSENSE NS SZ7.5 (GLOVE) ×4
GLOVE SKINSENSE STRL SZ7.5 (GLOVE) ×2 IMPLANT
GLOVE SURG SYN 7.5  E (GLOVE) ×2
GLOVE SURG SYN 7.5 E (GLOVE) ×1 IMPLANT
GOWN STRL REIN XL XLG (GOWN DISPOSABLE) ×9 IMPLANT
GRAFT FIBULA 2.5CM (Bone Implant) ×3 IMPLANT
K-WIRE 1.6 (WIRE) ×8
K-WIRE FX150X1.6XTROC PNT (WIRE) ×4
KIT BASIN OR (CUSTOM PROCEDURE TRAY) ×3 IMPLANT
KIT ROOM TURNOVER OR (KITS) ×3 IMPLANT
KWIRE FX150X1.6XTROC PNT (WIRE) ×4 IMPLANT
MANIFOLD NEPTUNE II (INSTRUMENTS) ×3 IMPLANT
NDL SUT 6 .5 CRC .975X.05 MAYO (NEEDLE) ×1 IMPLANT
NEEDLE MAYO TAPER (NEEDLE) ×2
NS IRRIG 1000ML POUR BTL (IV SOLUTION) ×3 IMPLANT
PACK GENERAL/GYN (CUSTOM PROCEDURE TRAY) ×3 IMPLANT
PAD ABD 8X10 STRL (GAUZE/BANDAGES/DRESSINGS) ×3 IMPLANT
PAD ARMBOARD 7.5X6 YLW CONV (MISCELLANEOUS) ×6 IMPLANT
PLATE 6H PROXIMAL TIBIA (Plate) ×3 IMPLANT
SCREW 3.5X38 (Screw) ×3 IMPLANT
SCREW LOCK 3.5X34 (Screw) ×2 IMPLANT
SCREW LOCK 3.5X55 (Screw) ×3 IMPLANT
SCREW LOCK 3.5X60 (Screw) ×3 IMPLANT
SCREW LOCK 3.5X70MM (Screw) ×3 IMPLANT
SCREW LOCK T20 34X3.5XST CORT (Screw) ×1 IMPLANT
SCREW NONLOCK 3.5X36 (Screw) ×3 IMPLANT
SCREW PARTIAL THREAD 4.0X70 (Screw) ×3 IMPLANT
SPONGE GAUZE 4X4 12PLY STER LF (GAUZE/BANDAGES/DRESSINGS) ×3 IMPLANT
SPONGE LAP 18X18 X RAY DECT (DISPOSABLE) ×3 IMPLANT
STAPLER VISISTAT 35W (STAPLE) ×3 IMPLANT
STOCKINETTE IMPERVIOUS 9X36 MD (GAUZE/BANDAGES/DRESSINGS) ×3 IMPLANT
SUCTION FRAZIER HANDLE 10FR (MISCELLANEOUS) ×2
SUCTION TUBE FRAZIER 10FR DISP (MISCELLANEOUS) ×1 IMPLANT
SUT ETHILON 2 0 FS 18 (SUTURE) IMPLANT
SUT ETHILON 3 0 PS 1 (SUTURE) IMPLANT
SUT PDS AB 0 CT 36 (SUTURE) IMPLANT
SUT VIC AB 0 CT1 27 (SUTURE) ×2
SUT VIC AB 0 CT1 27XBRD ANBCTR (SUTURE) ×1 IMPLANT
SUT VIC AB 1 CT1 27 (SUTURE) ×2
SUT VIC AB 1 CT1 27XBRD ANBCTR (SUTURE) ×1 IMPLANT
SUT VIC AB 2-0 CT1 27 (SUTURE) ×4
SUT VIC AB 2-0 CT1 TAPERPNT 27 (SUTURE) ×2 IMPLANT
TOWEL OR 17X24 6PK STRL BLUE (TOWEL DISPOSABLE) ×3 IMPLANT
TOWEL OR 17X26 10 PK STRL BLUE (TOWEL DISPOSABLE) ×6 IMPLANT

## 2016-05-10 NOTE — Op Note (Signed)
Date of Surgery: 05/10/2016  INDICATIONS: Leslie Coleman is a 82 y.o.-year-old female who sustained a right tibial plateau fracture; she was indicated for open reduction and internal fixation due to the displaced nature of the articular fracture and came to the operating room today for this procedure. The patient did consent to the procedure after discussion of the risks and benefits.  PREOPERATIVE DIAGNOSIS: right tibial plateau fracture (lateral condyle).  POSTOPERATIVE DIAGNOSIS: Same.  PROCEDURE: right tibial plateau open reduction and internal fixation.  CPT [phone]  SURGEON: N. Eduard Roux, M.D.  ASSIST: April Green,RNFA .  ANESTHESIA:  general  IV FLUIDS AND URINE: See anesthesia.  ESTIMATED BLOOD LOSS: minimal mL.  IMPLANTS: Smith and [employer]hew proximal tibial locking plate with 3.5 mm locking and nonlocking screws. Fibular strut graft.  DRAINS: none  COMPLICATIONS: None.  DESCRIPTION OF PROCEDURE: The patient was brought to the operating room and placed supine on the operating table.  The patient had been signed prior to the procedure and this was documented. The patient had the anesthesia placed by the anesthesiologist.  The prep verification and incision time-outs were performed to confirm that this was the correct patient, site, side and location. The patient had an SCD on the opposite lower extremity. The patient did receive antibiotics prior to the incision and was re-dosed during the procedure as needed at indicated intervals.  The patient had the lower extremity prepped and draped in the standard surgical fashion.  The bony landmarks were palpated and the incision was drawn with a marker.  The incision was taken down through the skin and subcutaneous tissue with the knife down to the fascia. The fascia was then incised in line with the skin incision, taking care to extend through Gerdy's tubercle. The fascia was then taken anteriorly and posteriorly off of Gerdy's tubercle, and  this exposed the joint capsule. The anterior compartment was also gently elevated from distal to proximal off of the tibia to expose the fracture, taking care to leave the fascia available for later repair.  The submeniscal arthrotomy was then performed, taking care to avoid any damage to the meniscus.  The meniscus itself was intact.  This arthrotomy was performed to better visualize the joint and the edge of the joint capsule itself was tagged with PDS tagging sutures for later closure. After reduction of articular fragments with a dental pick, the large periarticular clamp was placed at the proximal tibia and a small incision was made on the medial side to place this in the medial metaphysis area under fluoroscopy. This was done to reduce the widened condyles together. A cortical window was also made distally with a 2.5 mm drill and an osteotome. The cortical window was removed and then an osteotome was used to elevate the articular surface. This was followed both under direct visualization of the joint as well as on AP and lateral fluoroscopic imaging to confirm that the joint was well reduced. After the joint was elevated, the fibula allograft was placed in the hole to fill the void.  The lateral plate was then placed on the apex of the fracture and this was also clamped down with the peri-articular clamp.  All screws were then placed in the standard fashion, first drilling, then measuring with a depth gauge, and then placing the screws on power and tightening by hand. The cortical screw was first placed just distal to the apex to bring the plate to the bone and form an axilla for the fracture.  The  most proximal locking screws were then all placed. The remaining distal cortical screws were then placed.  All screws were confirmed on both AP and lateral views including both the length and location. The wound was copiously irrigated with 3 liters of saline via cysto tubing. The meniscus was repaired with PDS  suture.  The capsule was then closed using #1 PDS suture and then the deep fascia was closed with 0 Vicryl figure-of-eight interrupted suture. This was closed with no difficulty or tension in the anterior compartment. The subcutaneous layer was closed with 2-0 vicryl. The skin was then reapproximated with 3-0 nylon horizontal mattress sutures. The wounds were cleaned and dried a final time. A sterile dressing was placed. The patient was then wrapped in an Ace and placed in the unlocked hinged knee brace. The patient was then transferred back to the bed and left the operating room in stable condition.  All sponge and instrument counts were correct.  POSTOPERATIVE PLAN: Leslie Coleman will remain nonweightbearing on this leg for approximately 6 weeks; she will return for suture removal in 2 weeks.  The hinged knee brace will remain on at this time, but will remain completely unlocked.  Leslie Coleman will receive DVT prophylaxis based on other medications, activity level, and risk ratio of bleeding to thrombosis.  Leslie Cecil, MD [place] [phone] 9:30 PM

## 2016-05-10 NOTE — Transfer of Care (Signed)
Immediate Anesthesia Transfer of Care Note  Patient: Leslie Coleman  Procedure(s) Performed: Procedure(s): OPEN REDUCTION INTERNAL FIXATION (ORIF) RIGHT TIBIAL PLATEAU  WITH BONE GRAFT (Right)  Patient Location: PACU  Anesthesia Type:General  Level of Consciousness: awake and alert   Airway & Oxygen Therapy: Patient Spontanous Breathing and Patient connected to nasal cannula oxygen  Post-op Assessment: Report given to RN, Post -op Vital signs reviewed and stable, Patient moving all extremities and Patient moving all extremities X 4  Post vital signs: Reviewed and stable  Last Vitals:  Vitals:   05/09/16 2216 05/10/16 0617  BP: (!) 144/70 (!) 135/58  Pulse: 88 87  Resp: 18 18  Temp: 36.9 C 36.9 C    Last Pain:  Vitals:   05/10/16 0800  TempSrc:   PainSc: 0-No pain         Complications: No apparent anesthesia complications

## 2016-05-10 NOTE — Clinical Social Work Placement (Signed)
   CLINICAL SOCIAL WORK PLACEMENT  NOTE  Date:  05/10/2016  Patient Details  Name: Leslie Coleman MRN: [id number] Date of Birth: Apr 20, 1929  Clinical Social Work is seeking post-discharge placement for this patient at the [place] level of care (*CSW will initial, date and re-position this form in  chart as items are completed):  Yes   Patient/family provided with [place] Work Department's list of facilities offering this level of care within the geographic area requested by the patient (or if unable, by the patient's family).  Yes   Patient/family informed of their freedom to choose among providers that offer the needed level of care, that participate in Medicare, Medicaid or managed care program needed by the patient, have an available bed and are willing to accept the patient.  Yes   Patient/family informed of [facility]'s ownership interest in [facility] and [facility], as well as of the fact that they are under no obligation to receive care at these facilities.  PASRR submitted to EDS on 05/10/16     PASRR number received on 05/10/16     Existing PASRR number confirmed on       FL2 transmitted to all facilities in geographic area requested by pt/family on 05/10/16     FL2 transmitted to all facilities within larger geographic area on       Patient informed that his/her managed care company has contracts with or will negotiate with certain facilities, including the following:            Patient/family informed of bed offers received.  Patient chooses bed at       Physician recommends and patient chooses bed at      Patient to be transferred to   on  .  Patient to be transferred to facility by       Patient family notified on   of transfer.  Name of family member notified:        PHYSICIAN Please sign FL2     Additional Comment:    _______________________________________________ Lilly Cove, LCSW 05/10/2016,  11:44 AM

## 2016-05-10 NOTE — Progress Notes (Signed)
PHARMACY NOTE:  ANTIMICROBIAL RENAL DOSAGE ADJUSTMENT  Current antimicrobial regimen includes a mismatch between antimicrobial dosage and estimated renal function.  As per policy approved by the Pharmacy & Therapeutics and Medical Executive Committees, the antimicrobial dosage will be adjusted accordingly.  Current antimicrobial dosage:  Ancef 2gm IV Q6H x 3 doses  Indication: surgical prophylaxis  Renal Function:   Estimated Creatinine Clearance: 19.3 mL/min (by C-G formula based on SCr of 1.98 mg/dL (H)).   []      On intermittent HD, scheduled: []      On CRRT    Antimicrobial dosage has been changed to:  Ancef 2gm IV Q12H x 2 doses  Additional comments: N/A   Thank you for allowing pharmacy to be a part of this patient's care.   Atlee Villers D. Mina Marble, PharmD, BCPS Pager:  [phone] 05/10/2016, 7:51 PM

## 2016-05-10 NOTE — Anesthesia Postprocedure Evaluation (Signed)
Anesthesia Post Note  Patient: Leslie Coleman  Procedure(s) Performed: Procedure(s) (LRB): OPEN REDUCTION INTERNAL FIXATION (ORIF) RIGHT TIBIAL PLATEAU  WITH BONE GRAFT (Right)  Patient location during evaluation: PACU Anesthesia Type: General Level of consciousness: awake and alert and patient cooperative Pain management: pain level controlled Vital Signs Assessment: post-procedure vital signs reviewed and stable Respiratory status: spontaneous breathing and respiratory function stable Cardiovascular status: stable Anesthetic complications: no       Last Vitals:  Vitals:   05/10/16 1900 05/10/16 1915  BP: (!) 118/56 (!) 113/54  Pulse: 75 73  Resp: 10 13  Temp:  36.1 C    Last Pain:  Vitals:   05/10/16 1830  TempSrc:   PainSc: [place]

## 2016-05-10 NOTE — NC FL2 (Signed)
[place] MEDICAID FL2 LEVEL OF CARE SCREENING TOOL     IDENTIFICATION  Patient Name: Leslie Coleman Birthdate: 1928-08-18 Sex: female Admission Date (Current Location): 05/08/2016  [facility] and [place] Number:  Herbalist and Address:  [facility] [place]. [facility], [place] [address] [place], [place] [address]      Provider Number: [id number]  Attending Physician Name and Address:  Nita Sells, MD  Relative Name and Phone Number:       Current Level of Care: Hospital Recommended Level of Care: [place] Prior Approval Number:    Date Approved/Denied:   PASRR Number: [id number]  Discharge Plan: SNF    Current Diagnoses: Patient Active Problem List   Diagnosis Date Noted  . Tibial plateau fracture 05/08/2016  . Anemia in chronic kidney disease 12/16/2015  . Dizzy 07/07/2011  . Dehydration 07/07/2011  . Diabetes mellitus ([place]) 07/06/2011  . History of long-term treatment with high-risk medication 08/29/2010  . Abnormal PFTs (pulmonary function tests)   . V-tach ([place])   . HTN (hypertension)   . Hyperlipidemia   . Gout     Orientation RESPIRATION BLADDER Height & Weight     Self, Time, Situation, Place  Normal Continent Weight:   Height:     BEHAVIORAL SYMPTOMS/MOOD NEUROLOGICAL BOWEL NUTRITION STATUS      Continent Diet (regular/see DC summary)  AMBULATORY STATUS COMMUNICATION OF NEEDS Skin   Extensive Assist Verbally Surgical wounds                       Personal Care Assistance Level of Assistance  Bathing, Feeding, Dressing Bathing Assistance: Limited assistance Feeding assistance: Independent Dressing Assistance: Limited assistance     Functional Limitations Info  Sight, Hearing, Speech Sight Info: Adequate Hearing Info: Adequate Speech Info: Adequate    SPECIAL CARE FACTORS FREQUENCY  PT (By licensed PT), OT (By licensed OT)     PT Frequency: 5x OT Frequency: 5x            Contractures  Contractures Info: Not present    Additional Factors Info  Code Status, Allergies Code Status Info: Full Code Allergies Info: NKA           Current Medications (05/10/2016):  This is the current hospital active medication list Current Facility-Administered Medications  Medication Dose Route Frequency Provider Last Rate Last Dose  . acetaminophen (TYLENOL) tablet 650 mg  650 mg Oral Q6H PRN Eber Jones, MD       Or  . acetaminophen (TYLENOL) suppository 650 mg  650 mg Rectal Q6H PRN Eber Jones, MD      . amiodarone (PACERONE) tablet 100 mg  100 mg Oral q morning - 10a Eber Jones, MD   100 mg at 05/10/16 0957  . amLODipine (NORVASC) tablet 10 mg  10 mg Oral QHS Eber Jones, MD   10 mg at 05/09/16 2229  . bisacodyl (DULCOLAX) EC tablet 5 mg  5 mg Oral Daily PRN Eber Jones, MD      . hydrALAZINE (APRESOLINE) tablet 50 mg  50 mg Oral BID Eber Jones, MD   50 mg at 05/10/16 0956  . irbesartan (AVAPRO) tablet 300 mg  300 mg Oral Daily Eber Jones, MD   300 mg at 05/10/16 7371   And  . hydrochlorothiazide (HYDRODIURIL) tablet 25 mg  25 mg Oral Daily Eber Jones, MD   25 mg at 05/10/16 0957  . insulin  aspart (novoLOG) injection 0-5 Units  0-5 Units Subcutaneous QHS Eber Jones, MD      . insulin aspart (novoLOG) injection 0-9 Units  0-9 Units Subcutaneous TID WC Eber Jones, MD   1 Units at 05/09/16 1210  . levothyroxine (SYNTHROID, LEVOTHROID) tablet 100 mcg  100 mcg Oral QAC breakfast Eber Jones, MD   100 mcg at 05/10/16 0957  . morphine 2 MG/ML injection 2 mg  2 mg Intravenous Q2H PRN Nita Sells, MD      . multivitamin with minerals tablet 1 tablet  1 tablet Oral Daily Eber Jones, MD   1 tablet at 05/10/16 [id number]  . oxyCODONE-acetaminophen (PERCOCET/ROXICET) 5-325 MG per tablet 1-2 tablet  1-2 tablet Oral Q4H PRN Nita Sells, MD      . potassium chloride (K-DUR) CR  tablet 10 mEq  10 mEq Oral q morning - 10a Eber Jones, MD   10 mEq at 05/10/16 0956  . pravastatin (PRAVACHOL) tablet 20 mg  20 mg Oral QHS Eber Jones, MD   20 mg at 05/09/16 2229  . senna-docusate (Senokot-S) tablet 1 tablet  1 tablet Oral QHS PRN Eber Jones, MD         Discharge Medications: Please see discharge summary for a list of discharge medications.  Relevant Imaging Results:  Relevant Lab Results:   Additional Information SSN:  [id number]  Lilly Cove, [place]

## 2016-05-10 NOTE — H&P (Signed)

## 2016-05-10 NOTE — Progress Notes (Signed)
PROGRESS NOTE    Leslie Coleman  ELT:[id number] DOB: [birth year] DOA: 05/08/2016 PCP: [name], MD    Brief Narrative:   82 ? htn hypothroid Dm ty ii Anemia  Admit  With Lat tibial # c schatzker II classif Ortho consulted   Assessment & Plan:   Principal Problem:   Tibial plateau fracture Active Problems:   HTN (hypertension)   Hyperlipidemia   Diabetes mellitus (HCC)   Anemia in chronic kidney disease   R Tibial plateau #-long discussion with patient/family and ortho- for surgery 12/20 under Dr. [name].  Use percocet, and IV morphine for uncontrolled pain Htn-cont amlodipine 10 daily, Avapro/HCTZ SVT? On amiodarone-has never been told any irreg heart beats.  Clear from my perspective for elective surgery if prn DM ty II-cont SSI, sugars ranging 104-113 CKD stg 3-4 with mild met acidosis-monitor while on ARB/Thiazide-labs am are stable Anemia chronic disease-normocytic-iron levels as OP hld-pravastatin 20 qhs Hypothyroidism-cont synthroid 100 mcg daily-tsh 4-6 wk    DVT prophylaxis: foot pump Code Status: full Family Communication:discussion with patient and family members in room Disposition Plan: unclear   Consultants:   ortho  Procedures:   ct knee  Antimicrobials:   none    Subjective:  Awake alert in some pain Immobilizer on scheduled for surgery 16:00  Objective: Vitals:   05/09/16 0630 05/09/16 1406 05/09/16 2216 05/10/16 0617  BP: 104/60 (!) 136/59 (!) 144/70 (!) 135/58  Pulse: 79 84 88 87  Resp: 16 18 18 18   Temp: 98.6 F (37 C) 98.2 F (36.8 C) 98.4 F (36.9 C) 98.4 F (36.9 C)  TempSrc: Oral Oral Oral Oral  SpO2: 96% 99% 92% 90%    Intake/Output Summary (Last 24 hours) at 05/10/16 1352 Last data filed at 05/10/16 1000  Gross per 24 hour  Intake              360 ml  Output              800 ml  Net             -440 ml   There were no vitals filed for this visit.  Examination:  General exam:  calm and comfortable   Respiratory system: Clear Respiratory effort normal. Cardiovascular system: S1 & S2 heard, RRR. No JVDNo pedal edema. Gastrointestinal system: Abdomen is nondistended, soft and nontender. No organomegaly-obese Central nervous system: Alert and oriented. No focal neurological deficits. Extremities: Symmetric 5 x 5 power. Skin: No rashes, lesions or ulcers-RLE immobilized in brace Psychiatry: Judgement and insight appear normal. Mood & affect appropriate.     Data Reviewed: I have personally reviewed following labs and imaging studies  CBC:  Recent Labs Lab 05/08/16 1141 05/09/16 0750 05/10/16 0336  WBC 8.2 6.2 5.6  NEUTROABS 5.6  --   --   HGB 9.5* 9.5* 8.8*  HCT 28.4* 29.7* 27.2*  MCV 87.4 88.7 88.3  PLT 151 152 568*   Basic Metabolic Panel:  Recent Labs Lab 05/08/16 1141 05/09/16 0750 05/10/16 0336  NA 142 142 137  K 3.9 4.3 4.2  CL 113* 113* 110  CO2 20* 20* 19*  GLUCOSE 127* 85 113*  BUN 28* 26* 25*  CREATININE 1.89* 2.03* 1.98*  CALCIUM 8.8* 8.8* 8.4*   GFR: CrCl cannot be calculated (Unknown ideal weight.). Liver Function Tests:  Recent Labs Lab 05/10/16 0336  AST 19  ALT 10*  ALKPHOS 62  BILITOT 0.8  PROT 5.3*  ALBUMIN 2.3*   No results for input(s):  LIPASE, AMYLASE in the last 168 hours. No results for input(s): AMMONIA in the last 168 hours. Coagulation Profile: No results for input(s): INR, PROTIME in the last 168 hours. Cardiac Enzymes: No results for input(s): CKTOTAL, CKMB, CKMBINDEX, TROPONINI in the last 168 hours. BNP (last 3 results) No results for input(s): PROBNP in the last 8760 hours. HbA1C: No results for input(s): HGBA1C in the last 72 hours. CBG:  Recent Labs Lab 05/09/16 1139 05/09/16 1640 05/09/16 2217 05/10/16 0753 05/10/16 1259  GLUCAP 136* 120* 116* 116* 104*   Lipid Profile: No results for input(s): CHOL, HDL, LDLCALC, TRIG, CHOLHDL, LDLDIRECT in the last 72 hours. Thyroid Function Tests: No results for  input(s): TSH, T4TOTAL, FREET4, T3FREE, THYROIDAB in the last 72 hours. Anemia Panel: No results for input(s): VITAMINB12, FOLATE, FERRITIN, TIBC, IRON, RETICCTPCT in the last 72 hours. Sepsis Labs: No results for input(s): PROCALCITON, LATICACIDVEN in the last 168 hours.  Recent Results (from the past 240 hour(s))  Surgical pcr screen     Status: None   Collection Time: 05/10/16  5:03 AM  Result Value Ref Range Status   MRSA, PCR NEGATIVE NEGATIVE Final   Staphylococcus aureus NEGATIVE NEGATIVE Final    Comment:        The Xpert SA Assay (FDA approved for NASAL specimens in patients over 39 years of age), is one component of a comprehensive surveillance program.  Test performance has been validated by [facility] for patients greater than or equal to 67 year old. It is not intended to diagnose infection nor to guide or monitor treatment.          Radiology Studies: No results found.      Scheduled Meds: . amiodarone  100 mg Oral q morning - 10a  . amLODipine  10 mg Oral QHS  . hydrALAZINE  50 mg Oral BID  . irbesartan  300 mg Oral Daily   And  . hydrochlorothiazide  25 mg Oral Daily  . insulin aspart  0-5 Units Subcutaneous QHS  . insulin aspart  0-9 Units Subcutaneous TID WC  . levothyroxine  100 mcg Oral QAC breakfast  . multivitamin with minerals  1 tablet Oral Daily  . potassium chloride  10 mEq Oral q morning - 10a  . pravastatin  20 mg Oral QHS   Continuous Infusions:   LOS: 2 days    Time spent: 15  20 min face to face time    Nita Sells, MD [facility] Pager [phone]  If 7PM-7AM, please contact night-coverage [email or web address] Password [email or web address] 05/10/2016, 1:52 PM

## 2016-05-10 NOTE — Clinical Social Work Note (Signed)
Clinical Social Work Assessment  Patient Details  Name: Leslie Coleman MRN: [id number] Date of Birth: [birth year]  Date of referral:  05/10/16               Reason for consult:  Facility Placement                Permission sought to share information with:  Case Manager, Customer service manager, Family Supports Permission granted to share information::  Yes, Verbal Permission Granted  Name::        Agency::  SNF HUB  Relationship::  husband and daughter in room  Contact Information:     Housing/Transportation Living arrangements for the past 2 months:  [place] of Information:  Patient, Adult Children, Case Manager Patient Interpreter Needed:  None Criminal Activity/Legal Involvement Pertinent to Current Situation/Hospitalization:  No - Comment as needed Significant Relationships:  Adult Children, Other Family Members, Spouse Lives with:  Spouse Do you feel safe going back to the place where you live?  No Need for family participation in patient care:  Yes (Comment)  Care giving concerns:  Family voices concerns with patient discharging home due to clutter of the home and immobility.  Family would prefer SNF after surgery for ST rehab.  Spouse is in the home, but he is unable to assist or lift patient if needs arise. Patient has a strong family support and assistance.   Social Worker assessment / plan:  LCSW received consult for SNF on 05/09/16. At this time, patient was in observation and was not planning to have surgery.  Family discussed with surgeon and attending and it was agreed to do surgery on 12.20 at 4pm.  Patient is in agreement with plan for SNF after DC. Family reports they have another family member at [facility] and would like this to be their first choice.  LCSW completed SNF work up. Awaiting bed offers and then will follow up with patient and family regarding bed status.  Plan: DC to SNF when medically stable.  Employment status:   Retired Forensic scientist:  [employer] PT Recommendations:  [place] / Referral to community resources:  [place]  Patient/Family's Response to care:  Agreeable to plan.    Patient/Family's Understanding of and Emotional Response to Diagnosis, Current Treatment, and Prognosis:  Family very persistent and engaged in patient's treatment.  Understanding of need for SNF at DC to increase mobility and strengthen patient.   Emotional Assessment Appearance:  Appears stated age Attitude/Demeanor/Rapport:  Other (cooperative, engaged) Affect (typically observed):  Accepting, Adaptable, Pleasant Orientation:  Oriented to Self, Oriented to Place, Oriented to  Time, Oriented to Situation Alcohol / Substance use:  Not Applicable Psych involvement (Current and /or in the community):  No (Comment)  Discharge Needs  Concerns to be addressed:  No discharge needs identified Readmission within the last 30 days:  No Current discharge risk:  None Barriers to Discharge:  No Barriers Identified, Continued Medical Work up   Lilly Cove, LCSW 05/10/2016, 12:01 PM

## 2016-05-10 NOTE — Anesthesia Procedure Notes (Signed)
Procedure Name: Intubation Date/Time: 05/10/2016 4:32 PM Performed by: Everlean Cherry A Pre-anesthesia Checklist: Patient identified, Emergency Drugs available, Suction available and Patient being monitored Patient Re-evaluated:Patient Re-evaluated prior to inductionOxygen Delivery Method: Circle system utilized Preoxygenation: Pre-oxygenation with 100% oxygen Intubation Type: IV induction Ventilation: Mask ventilation without difficulty and Oral airway inserted - appropriate to patient size Laryngoscope Size: Sabra Heck and 2 Grade View: Grade I Tube type: Oral Tube size: 7.0 mm Number of attempts: 1 Airway Equipment and Method: Stylet Placement Confirmation: ETT inserted through vocal cords under direct vision,  positive ETCO2 and breath sounds checked- equal and bilateral Secured at: 21 cm Tube secured with: Tape Dental Injury: Teeth and Oropharynx as per pre-operative assessment

## 2016-05-10 NOTE — Anesthesia Preprocedure Evaluation (Signed)
Anesthesia Evaluation  Patient identified by MRN, date of birth, ID band  Reviewed: Allergy & Precautions, NPO status , Patient's Chart, lab work & pertinent test results  Airway Mallampati: III  TM Distance: <3 FB     Dental  (+) Edentulous Upper, Edentulous Lower   Pulmonary neg pulmonary ROS,    breath sounds clear to auscultation       Cardiovascular hypertension,  Rhythm:Regular Rate:Normal     Neuro/Psych negative neurological ROS     GI/Hepatic   Endo/Other  diabetesHypothyroidism   Renal/GU Renal InsufficiencyRenal disease     Musculoskeletal   Abdominal   Peds  Hematology  (+) anemia , hct 27.0+   Anesthesia Other Findings   Reproductive/Obstetrics                             Anesthesia Physical Anesthesia Plan  ASA: III  Anesthesia Plan: General   Post-op Pain Management:    Induction:   Airway Management Planned: Oral ETT  Additional Equipment:   Intra-op Plan:   Post-operative Plan:   Informed Consent:   Dental advisory given  Plan Discussed with: CRNA  Anesthesia Plan Comments:         Anesthesia Quick Evaluation

## 2016-05-10 NOTE — Evaluation (Signed)
Occupational Therapy Evaluation Patient Details Name: Leslie Coleman MRN: [id number] DOB: 10-Aug-1928 Today's Date: 05/10/2016    History of Present Illness 82 yo female admitted on 05/08/16 following a fall at [employer] on 05/07/16 resulting in a right tibial plateau fracture. PMH significant for HTN, SM, anemia, hypothyroid.    Clinical Impression   Pt with decline in function and safety with ADLs and ADL mobility with decreased strength, balance and endurance. Pt scheduled for OR this afternoon. Pt would benefit form acute OT services to address impairments to increase level of function and safety    Follow Up Recommendations  SNF    Equipment Recommendations  None recommended by OT;Other (comment) (TBD at next venue of care)    Recommendations for Other Services       Precautions / Restrictions Precautions Precautions: Fall Precaution Comments: pt had a fall at her church on 05/07/16 Required Braces or Orthoses: Other Brace/Splint (hinged knee brace, R LE) Knee Immobilizer - Right: On at all times Restrictions Weight Bearing Restrictions: Yes RLE Weight Bearing: Non weight bearing      Mobility Bed Mobility Overal bed mobility: Needs Assistance Bed Mobility: Supine to Sit;Sit to Supine     Supine to sit: Mod assist Sit to supine: Mod assist   General bed mobility comments: Mod A to bring RLE EOB and to assist with bringing trunk upright and R LE back onto bed. Assist to scoot to [facility]  Transfers Overall transfer level: Needs assistance               General transfer comment: NT. Mod A per PT note    Balance   Sitting-balance support: Feet supported;No upper extremity supported Sitting balance-Leahy Scale: Fair                                      ADL Overall ADL's : Needs assistance/impaired     Grooming: Wash/dry hands;Wash/dry face;Min guard;Sitting   Upper Body Bathing: Sitting;Minimal assistance   Lower Body Bathing: Maximal  assistance   Upper Body Dressing : Minimal assistance;Sitting   Lower Body Dressing: Total assistance     Toilet Transfer Details (indicate cue type and reason): NT. Mod A for transfers per PT note Toileting- Clothing Manipulation and Hygiene: Total assistance;Bed level       Functional mobility during ADLs:  (NT. Mod A per PT note)       Vision Vision Assessment?: No apparent visual deficits              Pertinent Vitals/Pain Pain Assessment: 0-10 Pain Score: 6  Pain Location: R LE Pain Descriptors / Indicators: Grimacing;Guarding;Sore;Aching Pain Intervention(s): Limited activity within patient's tolerance;Monitored during session;Repositioned     Hand Dominance Right   Extremity/Trunk Assessment Upper Extremity Assessment Upper Extremity Assessment: Generalized weakness   Lower Extremity Assessment Lower Extremity Assessment: Defer to PT evaluation       Communication Communication Communication: No difficulties   Cognition Arousal/Alertness: Awake/alert Behavior During Therapy: WFL for tasks assessed/performed Overall Cognitive Status: Within Functional Limits for tasks assessed                     General Comments   pt very pleasant and cooperative, family very supportive                 Home Living Family/patient expects to be discharged to:: Skilled nursing facility Living Arrangements: Spouse/significant  other Available Help at Discharge: Family;Available PRN/intermittently Type of Home: House Home Access: Ramped entrance     Home Layout: One level     Bathroom Shower/Tub: Occupational psychologist: Handicapped height Bathroom Accessibility: Yes   Home Equipment: Environmental consultant - 2 wheels;Cane - single point          Prior Functioning/Environment Level of Independence: Independent with assistive device(s)        Comments: was independent getting around with walker in the house and was cooking and cleaning         OT Problem List: Decreased strength;Impaired balance (sitting and/or standing);Pain;Decreased activity tolerance;Decreased knowledge of use of DME or AE   OT Treatment/Interventions: Self-care/ADL training;DME and/or AE instruction;Therapeutic activities;Patient/family education    OT Goals(Current goals can be found in the care plan section) Acute Rehab OT Goals Patient Stated Goal: to go home and not to rehab OT Goal Formulation: With patient/family Time For Goal Achievement: 05/17/16 Potential to Achieve Goals: Good ADL Goals Pt Will Perform Grooming: with supervision;with set-up;sitting Pt Will Perform Upper Body Bathing: with min guard assist;sitting Pt Will Perform Lower Body Bathing: with max assist;with mod assist;sitting/lateral leans Pt Will Perform Upper Body Dressing: with min guard assist;sitting Pt Will Transfer to Toilet: with min assist;bedside commode;stand pivot transfer  OT Frequency: Min 2X/week   Barriers to D/C: Decreased caregiver support                        End of Session Equipment Utilized During Treatment: Other (comment) (R hinged knee brace)  Activity Tolerance: Patient limited by pain Patient left: in bed;with call bell/phone within reach;with family/visitor present   Time: 9326-7124 OT Time Calculation (min): 23 min Charges:  OT General Charges $OT Visit: 1 Procedure OT Evaluation $OT Eval Moderate Complexity: 1 Procedure OT Treatments $Therapeutic Activity: 8-22 mins G-Codes:    Britt Bottom 05/10/2016, 11:46 AM

## 2016-05-10 NOTE — Consult Note (Signed)
            [facility] CM Primary Care Navigator  05/10/2016  Leslie Coleman 04/30/1929 [id number]    Went to see patientat the bedside to identify possible discharge needsbut staff reports that patient is currently in surgery.   Will attempt to meet with patient at another time when available.  For additional questions please contact:  Edwena Felty A. Derec Mozingo, BSN, RN-BC [facility] PRIMARY CARE Navigator Cell: [phone]

## 2016-05-11 ENCOUNTER — Encounter (HOSPITAL_COMMUNITY): Payer: Self-pay | Admitting: Orthopaedic Surgery

## 2016-05-11 LAB — COMPREHENSIVE METABOLIC PANEL
ALT: 8 U/L — ABNORMAL LOW (ref 14–54)
AST: 19 U/L (ref 15–41)
Albumin: 2.3 g/dL — ABNORMAL LOW (ref 3.5–5.0)
Alkaline Phosphatase: 59 U/L (ref 38–126)
Anion gap: 8 (ref 5–15)
BUN: 21 mg/dL — ABNORMAL HIGH (ref 6–20)
CO2: 20 mmol/L — ABNORMAL LOW (ref 22–32)
Calcium: 8.4 mg/dL — ABNORMAL LOW (ref 8.9–10.3)
Chloride: 111 mmol/L (ref 101–111)
Creatinine, Ser: 1.8 mg/dL — ABNORMAL HIGH (ref 0.44–1.00)
GFR calc Af Amer: 28 mL/min — ABNORMAL LOW (ref >60)
GFR calc non Af Amer: 25 mL/min — ABNORMAL LOW (ref >60)
Glucose, Bld: 82 mg/dL (ref 65–99)
Potassium: 4 mmol/L (ref 3.5–5.1)
Sodium: 139 mmol/L (ref 135–145)
Total Bilirubin: 0.5 mg/dL (ref 0.3–1.2)
Total Protein: 5.2 g/dL — ABNORMAL LOW (ref 6.5–8.1)

## 2016-05-11 LAB — CBC
HCT: 26.1 % — ABNORMAL LOW (ref 36.0–46.0)
HEMOGLOBIN: 8.3 g/dL — AB (ref 12.0–15.0)
MCH: 28.2 pg (ref 26.0–34.0)
MCHC: 31.8 g/dL (ref 30.0–36.0)
MCV: 88.8 fL (ref 78.0–100.0)
Platelets: 122 10*3/uL — ABNORMAL LOW (ref 150–400)
RBC: 2.94 MIL/uL — AB (ref 3.87–5.11)
RDW: 17.2 % — ABNORMAL HIGH (ref 11.5–15.5)
WBC: 5.1 10*3/uL (ref 4.0–10.5)

## 2016-05-11 LAB — BASIC METABOLIC PANEL
BUN: 21 mg/dL (ref 4–21)
CREATININE: 1.8 mg/dL — AB (ref 0.5–1.1)
Glucose: 82 mg/dL
Potassium: 4 mmol/L (ref 3.4–5.3)
SODIUM: 139 mmol/L (ref 137–147)

## 2016-05-11 LAB — CBC AND DIFFERENTIAL
HEMATOCRIT: 26 % — AB (ref 36–46)
Hemoglobin: 8.3 g/dL — AB (ref 12.0–16.0)
Platelets: 122 10*3/uL — AB (ref 150–399)
WBC: 5.1 10^3/mL

## 2016-05-11 LAB — GLUCOSE, CAPILLARY
GLUCOSE-CAPILLARY: 73 mg/dL (ref 65–99)
GLUCOSE-CAPILLARY: 201 mg/dL — AB (ref 65–99)
GLUCOSE-CAPILLARY: 184 mg/dL — AB (ref 65–99)
Glucose-Capillary: 115 mg/dL — ABNORMAL HIGH (ref 65–99)

## 2016-05-11 MED ORDER — METHOCARBAMOL 500 MG PO TABS: 500.0000 mg | ORAL_TABLET | Freq: Four times a day (QID) | ORAL | 0 refills | Status: DC | PRN

## 2016-05-11 MED ORDER — POLYETHYLENE GLYCOL 3350 17 G PO PACK
17.0000 g | PACK | Freq: Every day | ORAL | Status: DC
  Administered 2016-05-11: 17 g via ORAL
  Filled 2016-05-11: qty 1

## 2016-05-11 MED ORDER — ENOXAPARIN SODIUM 30 MG/0.3ML ~~LOC~~ SOLN: 30.0000 mg | SUBCUTANEOUS | 0 refills | Status: DC

## 2016-05-11 MED ORDER — OXYCODONE HCL 5 MG PO TABS
5.0000 mg | ORAL_TABLET | ORAL | Status: DC
  Administered 2016-05-11 – 2016-05-12 (×4): 10 mg via ORAL
  Filled 2016-05-11 (×5): qty 2

## 2016-05-11 MED ORDER — SENNOSIDES-DOCUSATE SODIUM 8.6-50 MG PO TABS: 1.0000 | ORAL_TABLET | Freq: Every evening | ORAL | Status: DC | PRN

## 2016-05-11 MED ORDER — OXYCODONE HCL 5 MG PO TABS: 5.0000 mg | ORAL_TABLET | ORAL | 0 refills | Status: DC

## 2016-05-11 MED ORDER — POLYETHYLENE GLYCOL 3350 17 G PO PACK: 17.0000 g | PACK | Freq: Every day | ORAL | 0 refills | Status: DC

## 2016-05-11 NOTE — Progress Notes (Signed)
   Subjective:  Patient reports pain as mild.    Objective:   VITALS:   Vitals:   05/10/16 1915 05/10/16 1930 05/10/16 2208 05/11/16 0705  BP: (!) 113/54  126/65 131/64  Pulse: 73 76 77 79  Resp: 13 12 19 18   Temp: 97 F (36.1 C)  98 F (36.7 C) 97.9 F (36.6 C)  TempSrc:   Oral Oral  SpO2: 97% 97% 98% 93%  Weight: 158 lb 11.7 oz (72 kg)       Neurovascular intact Sensation intact distally Intact pulses distally Dorsiflexion/Plantar flexion intact Incision: dressing C/D/I and no drainage No cellulitis present Compartment soft   Lab Results  Component Value Date   WBC 5.1 05/11/2016   HGB 8.3 (L) 05/11/2016   HCT 26.1 (L) 05/11/2016   MCV 88.8 05/11/2016   PLT 122 (L) 05/11/2016     Assessment/Plan:  1 Day Post-Op   - Expected postop acute blood loss anemia - will monitor for symptoms - Up with PT/OT - DVT ppx - SCDs, ambulation, loveno - NWB operative extremity, ROM as tolerated within bledsoe brace - f/u 2 weeks in office  Eduard Roux 05/11/2016, 7:58 AM [phone]

## 2016-05-11 NOTE — Discharge Summary (Signed)
Physician Discharge Summary  Leslie Coleman UGQ:[id number] DOB: 1928-08-28 DOA: 05/08/2016  PCP: [name], MD  Admit date: 05/08/2016 Discharge date: 05/11/2016  Time spent: 45 minutes  Recommendations for Outpatient Follow-up:  1. Nonweightbearing operative extremity and range of motion as tolerated within the brace  2. Will need CBC in about one week post discharge 3. Benefit from TSH has not a within a month 4. Suggest addition of iron supplementation as an outpatient -1 she is discharged from the skilled facility would resume Procrit as she has been taking before 5. Discharge to skilled facility 12/21 am   Discharge Diagnoses:  Principal Problem:   Tibial plateau fracture Active Problems:   HTN (hypertension)   Hyperlipidemia   Diabetes mellitus ([place])   Anemia in chronic kidney disease   Discharge Condition: Good  Diet recommendation: Diabetic heart healthy  Filed Weights   05/10/16 1915 05/11/16 0813  Weight: 72 kg (158 lb 11.7 oz) 72 kg (158 lb 11.7 oz)    History of present illness:  82 ? htn hypothroid Dm ty ii Anemia ? htn hypothroid Dm ty ii Anemia  Admit  With Lat tibial # c schatzker II classif Ortho consulted underwent surgery as below   Hospital Course:  R Tibial plateau #-patient underwent surgery 12/20 under Dr. [name].  Use percocet, and IV morphine for uncontrolled pain-will need as an out agent Lovenox at least for 4 weeks and also would benefit from ambulation.  In hospital SCDs Htn-cont amlodipine 10 daily, Avapro/HCTZ, resumed hydralazine 50 3 times a day SVT? On amiodarone-has never been told any irreg heart beats.  Clear from my perspective for elective surgery if prn DM ty II-cont SSI, sugars been controlled in the 70-80 range on discharge--resume Glucotrol XL 5 mg discharge CKD stg 3-4 with mild met acidosis-monitor while on ARB/Thiazide-labs am are stable-the kidney function was improved at time of discharge  to a GFR of 28 Hypothyroidism resume Synthroid 1 100  g a.m. and needs TSH in 1 Anemia chronic disease-normocytic-iron levels as OP--on this admission she has been started on iron--he does get Procrit as an outpatient and will resume this every 4 weeks as per prior schedule hld-pravastatin 20 qhs Gout-she has not been taking her allopurinol may resume if needed as OP Hypothyroidism-cont synthroid 100 mcg daily-tsh 4-6 wk  Procedures:  Right tibial plateau open reduction and internal fixation 05/10/16  Consultations:  Orthopedics  Discharge Exam: Vitals:   05/10/16 2208 05/11/16 0705  BP: 126/65 131/64  Pulse: 77 79  Resp: 19 18  Temp: 98 F (36.7 C) 97.9 F (36.6 C)    General: EOMI NCAT Cardiovascular: s1 S2 no murmur rub or gallop Respiratory: Clinically clear added sound  Discharge Instructions    Current Discharge Medication List    CONTINUE these medications which have NOT CHANGED   Details  acetaminophen (TYLENOL) 500 MG tablet Take 500 mg by mouth at bedtime.     amiodarone (PACERONE) 100 MG tablet Take 1 tablet (100 mg total) by mouth every morning. Qty: 90 tablet, Refills: 3    amLODipine (NORVASC) 10 MG tablet Take 10 mg by mouth at bedtime.    Epoetin Alfa (PROCRIT IJ) Inject 10,000 Units as directed every 28 (twenty-eight) days. As directed every 4 weeks.    glipiZIDE (GLUCOTROL XL) 5 MG 24 hr tablet Take 5 mg by mouth daily.    hydrALAZINE (APRESOLINE) 50 MG tablet TAKE 1 TABLET BY MOUTH TWICE A DAY Qty: 180 tablet, Refills: 3    levothyroxine (SYNTHROID, LEVOTHROID) 100 MCG tablet  Take 1 tablet (100 mcg total) by mouth daily before breakfast. Qty: 30 tablet, Refills: 9    Multiple Vitamin (MULTIVITAMIN WITH MINERALS) TABS tablet Take 1 tablet by mouth daily.    naproxen sodium (ANAPROX) 220 MG tablet Take 220 mg by mouth every morning.    omega-3 acid ethyl esters (LOVAZA) 1 g capsule TAKE 2 CAPSULES BY MOUTH 2 TIMES DAILY. Qty: 360 capsule, Refills: 3    potassium chloride (KLOR-CON 10) 10 MEQ  tablet Take 1 tablet (10 mEq total) by mouth every morning. Qty: 90 tablet, Refills: 2   Associated Diagnoses: History of underactive thyroid    pravastatin (PRAVACHOL) 20 MG tablet TAKE 1 TABLET BY MOUTH AT BEDTIME Qty: 30 tablet, Refills: 9    telmisartan-hydrochlorothiazide (MICARDIS HCT) 80-25 MG tablet Take 1 tablet by mouth daily. Refills: 3    allopurinol (ZYLOPRIM) 300 MG tablet TAKE 1 TABLET BY MOUTH EVERY DAY Qty: 90 tablet, Refills: 1    oxymetazoline (AFRIN NASAL SPRAY) 0.05 % nasal spray Place 1 spray into both nostrils 2 (two) times daily as needed (nosebleed, no more than 3 days). Qty: 30 mL, Refills: 0       No Known Allergies Follow-up Information    Eduard Roux, MD Follow up in 2 week(s).   Specialty:  Orthopedic Surgery Why:  For suture removal, For wound re-check Contact information: [address] [place] [address] [phone]            The results of significant diagnostics from this hospitalization (including imaging, microbiology, ancillary and laboratory) are listed below for reference.    Significant Diagnostic Studies: Ct Knee Right Wo Contrast  Result Date: 05/08/2016 CLINICAL DATA:  Right knee pain EXAM: CT OF THE RIGHT KNEE WITHOUT CONTRAST TECHNIQUE: Multidetector CT imaging of the RIGHT knee was performed according to the standard protocol. Multiplanar CT image reconstructions were also generated. COMPARISON:  None. FINDINGS: Bones/Joint/Cartilage Comminuted and depressed lateral tibial plateau fracture. 10 mm of depression of the articular surface. Area of depression measures 2.5 x 2.3 cm. Severe osteopenia. No other fracture or dislocation. Mild osteoarthritis of the medial femorotibial compartment. Large joint effusion. Small Baker cyst. Ligaments Suboptimally assessed by CT. Muscles and Tendons Muscles are normal. No significant muscle atrophy. Intact quadriceps tendon and patellar tendon. Soft tissues No fluid collection  or hematoma. No soft tissue mass. There is peripheral vascular atherosclerotic disease. IMPRESSION: 1. Comminuted and depressed lateral tibial plateau fracture most consistent with Schatzker II classification. Electronically Signed   By: Kathreen Devoid   On: 05/08/2016 10:00   Dg Knee Complete 4 Views Right  Result Date: 05/10/2016 CLINICAL DATA:  Right tibia ORIF EXAM: DG C-ARM 61-120 MIN; RIGHT KNEE - COMPLETE 4+ VIEW COMPARISON:  None. FINDINGS: Four fluoroscopic images obtained during ORIF of the right tibia are provided with the reported fluoroscopy time of 1 minutes 46 seconds. There is a screw plate fixation along the lateral surface of the proximal right tibia without hardware adverse features. IMPRESSION: Intraoperative images during right tibia ORIF. Electronically Signed   By: Ulyses Jarred M.D.   On: 05/10/2016 19:15   Dg Knee Complete 4 Views Right  Result Date: 05/08/2016 CLINICAL DATA:  Post fall stepping through a door at church yesterday. EXAM: RIGHT KNEE - COMPLETE 4+ VIEW COMPARISON:  Right hip radiographs-earlier same day FINDINGS: Osteopenia. There is a potential minimally displaced fracture involving the weight-bearing surface of the lateral aspects of the tibial plateau with associated small knee joint effusion. No  definitive evidence of lipohemarthrosis. Mild tricompartmental degenerative change of the knee with joint space loss, subchondral sclerosis osteophytosis. Query minimal amount of chondrocalcinosis within the medial and lateral joint spaces. Vascular calcifications.  No radiopaque foreign body. IMPRESSION: Osteopenia with potential minimally displaced fracture involving the lateral weight-bearing aspect of the tibial plateau. Correlation for tenderness at this location is recommended. Further evaluation with knee CT could be performed as indicated. Electronically Signed   By: Sandi Mariscal M.D.   On: 05/08/2016 08:33   Dg C-arm 1-60 Min  Result Date: 05/10/2016 CLINICAL  DATA:  Right tibia ORIF EXAM: DG C-ARM 61-120 MIN; RIGHT KNEE - COMPLETE 4+ VIEW COMPARISON:  None. FINDINGS: Four fluoroscopic images obtained during ORIF of the right tibia are provided with the reported fluoroscopy time of 1 minutes 46 seconds. There is a screw plate fixation along the lateral surface of the proximal right tibia without hardware adverse features. IMPRESSION: Intraoperative images during right tibia ORIF. Electronically Signed   By: Ulyses Jarred M.D.   On: 05/10/2016 19:15   Dg Hip Unilat W Or Wo Pelvis 2-3 Views Right  Result Date: 05/08/2016 CLINICAL DATA:  Post fall stepping through a door at church yesterday. EXAM: DG HIP (WITH OR WITHOUT PELVIS) 2-3V RIGHT COMPARISON:  None. FINDINGS: No fracture or dislocation. Right hip joint spaces are preserved. No evidence avascular necrosis. Limited visualization of the adjacent pelvis is negative for displaced fracture. Moderate severe multilevel lumbar spine DDD is suspected though incompletely evaluated. Several phleboliths overlie the lower pelvis bilaterally. Suspected bone island overlies the cranial aspect of the left ilium, incompletely evaluated. Vascular calcifications. No radiopaque foreign body. IMPRESSION: 1. No displaced right-sided rib fracture. 2. Moderate severe multilevel lumbar spine DDD is suspected though incompletely evaluated. Electronically Signed   By: Sandi Mariscal M.D.   On: 05/08/2016 08:34    Microbiology: Recent Results (from the past 240 hour(s))  Surgical pcr screen     Status: None   Collection Time: 05/10/16  5:03 AM  Result Value Ref Range Status   MRSA, PCR NEGATIVE NEGATIVE Final   Staphylococcus aureus NEGATIVE NEGATIVE Final    Comment:        The Xpert SA Assay (FDA approved for NASAL specimens in patients over 15 years of age), is one component of a comprehensive surveillance program.  Test performance has been validated by [facility] for patients greater than or equal to 29 year old. It  is not intended to diagnose infection nor to guide or monitor treatment.      Labs: Basic Metabolic Panel:  Recent Labs Lab 05/08/16 1141 05/09/16 0750 05/10/16 0336 05/11/16 0433  NA 142 142 137 139  K 3.9 4.3 4.2 4.0  CL 113* 113* 110 111  CO2 20* 20* 19* 20*  GLUCOSE 127* 85 113* 82  BUN 28* 26* 25* 21*  CREATININE 1.89* 2.03* 1.98* 1.80*  CALCIUM 8.8* 8.8* 8.4* 8.4*   Liver Function Tests:  Recent Labs Lab 05/10/16 0336 05/11/16 0433  AST 19 19  ALT 10* 8*  ALKPHOS 62 59  BILITOT 0.8 0.5  PROT 5.3* 5.2*  ALBUMIN 2.3* 2.3*   No results for input(s): LIPASE, AMYLASE in the last 168 hours. No results for input(s): AMMONIA in the last 168 hours. CBC:  Recent Labs Lab 05/08/16 1141 05/09/16 0750 05/10/16 0336 05/11/16 0433  WBC 8.2 6.2 5.6 5.1  NEUTROABS 5.6  --   --   --   HGB 9.5* 9.5* 8.8* 8.3*  HCT  28.4* 29.7* 27.2* 26.1*  MCV 87.4 88.7 88.3 88.8  PLT 151 152 139* 122*   Cardiac Enzymes: No results for input(s): CKTOTAL, CKMB, CKMBINDEX, TROPONINI in the last 168 hours. BNP: BNP (last 3 results) No results for input(s): BNP in the last 8760 hours.  ProBNP (last 3 results) No results for input(s): PROBNP in the last 8760 hours.  CBG:  Recent Labs Lab 05/10/16 0753 05/10/16 1259 05/10/16 1833 05/10/16 2207 05/11/16 0758  GLUCAP 116* 104* 92 99 73       Signed:  Nita Sells MD.  [facility] 05/11/2016, 9:30 AM

## 2016-05-11 NOTE — Progress Notes (Addendum)
-  Patient has been given bed offers with first choice: [facility]. [facility] made a bed offer and plan will be to admit patient on Friday 12/22.  Call placed to SNF: message left of bed acceptance and accepted on the HUB.  Will continue to follow and assist with disposition.  Lane Hacker, MSW Clinical Social Work: Printmaker Coverage for :  [phone]

## 2016-05-11 NOTE — Progress Notes (Signed)
PT Cancellation Note  Patient Details Name: Leslie Coleman MRN: [id number] DOB: August 21, 1928   Cancelled Treatment:    Reason Eval/Treat Not Completed: Pain limiting ability to participate.  Pt indicates too painful for PT or mobility.  Will check back another time.     [place], [place]  [phone] 05/11/2016, 9:16 AM

## 2016-05-11 NOTE — Discharge Summary (Addendum)
Patient seen and examined this morning and in pain We will dose her oxycodone around-the-clock Her Foley should be removed He will check a hemoglobin tomorrow morning 12/22 but if it remains within the 8 range and hemodynamically patient remained stable, anticipate a slow recovery at nursing facility with physical therapy over site My partner will reassess tomorrow morning and if vitals are stable can be discharged Social worker is aware I had a long discussion with family both in person and on the phone  Verneita Griffes, MD [facility] [phone]

## 2016-05-12 DIAGNOSIS — E1122 Type 2 diabetes mellitus with diabetic chronic kidney disease: Secondary | ICD-10-CM | POA: Diagnosis not present

## 2016-05-12 DIAGNOSIS — S82141A Displaced bicondylar fracture of right tibia, initial encounter for closed fracture: Secondary | ICD-10-CM | POA: Diagnosis present

## 2016-05-12 DIAGNOSIS — E039 Hypothyroidism, unspecified: Secondary | ICD-10-CM | POA: Diagnosis not present

## 2016-05-12 DIAGNOSIS — N183 Chronic kidney disease, stage 3 (moderate): Secondary | ICD-10-CM

## 2016-05-12 DIAGNOSIS — A498 Other bacterial infections of unspecified site: Secondary | ICD-10-CM | POA: Diagnosis not present

## 2016-05-12 DIAGNOSIS — R531 Weakness: Secondary | ICD-10-CM | POA: Diagnosis not present

## 2016-05-12 DIAGNOSIS — I10 Essential (primary) hypertension: Secondary | ICD-10-CM | POA: Diagnosis not present

## 2016-05-12 DIAGNOSIS — I4891 Unspecified atrial fibrillation: Secondary | ICD-10-CM | POA: Diagnosis not present

## 2016-05-12 DIAGNOSIS — R278 Other lack of coordination: Secondary | ICD-10-CM | POA: Diagnosis not present

## 2016-05-12 DIAGNOSIS — Z9181 History of falling: Secondary | ICD-10-CM | POA: Diagnosis not present

## 2016-05-12 DIAGNOSIS — I472 Ventricular tachycardia: Secondary | ICD-10-CM | POA: Diagnosis not present

## 2016-05-12 DIAGNOSIS — S82141D Displaced bicondylar fracture of right tibia, subsequent encounter for closed fracture with routine healing: Secondary | ICD-10-CM | POA: Diagnosis not present

## 2016-05-12 DIAGNOSIS — Z8679 Personal history of other diseases of the circulatory system: Secondary | ICD-10-CM | POA: Diagnosis not present

## 2016-05-12 DIAGNOSIS — N390 Urinary tract infection, site not specified: Secondary | ICD-10-CM | POA: Diagnosis not present

## 2016-05-12 DIAGNOSIS — N184 Chronic kidney disease, stage 4 (severe): Secondary | ICD-10-CM | POA: Diagnosis not present

## 2016-05-12 DIAGNOSIS — R488 Other symbolic dysfunctions: Secondary | ICD-10-CM | POA: Diagnosis not present

## 2016-05-12 DIAGNOSIS — S82112A Displaced fracture of left tibial spine, initial encounter for closed fracture: Secondary | ICD-10-CM | POA: Diagnosis not present

## 2016-05-12 DIAGNOSIS — D631 Anemia in chronic kidney disease: Secondary | ICD-10-CM | POA: Diagnosis not present

## 2016-05-12 DIAGNOSIS — B952 Enterococcus as the cause of diseases classified elsewhere: Secondary | ICD-10-CM | POA: Diagnosis not present

## 2016-05-12 DIAGNOSIS — M6281 Muscle weakness (generalized): Secondary | ICD-10-CM | POA: Diagnosis not present

## 2016-05-12 DIAGNOSIS — E034 Atrophy of thyroid (acquired): Secondary | ICD-10-CM | POA: Diagnosis not present

## 2016-05-12 DIAGNOSIS — I129 Hypertensive chronic kidney disease with stage 1 through stage 4 chronic kidney disease, or unspecified chronic kidney disease: Secondary | ICD-10-CM | POA: Diagnosis not present

## 2016-05-12 DIAGNOSIS — Z20828 Contact with and (suspected) exposure to other viral communicable diseases: Secondary | ICD-10-CM | POA: Diagnosis not present

## 2016-05-12 DIAGNOSIS — E7800 Pure hypercholesterolemia, unspecified: Secondary | ICD-10-CM | POA: Diagnosis not present

## 2016-05-12 DIAGNOSIS — E876 Hypokalemia: Secondary | ICD-10-CM | POA: Diagnosis not present

## 2016-05-12 LAB — GLUCOSE, CAPILLARY
GLUCOSE-CAPILLARY: 137 mg/dL — AB (ref 65–99)
Glucose-Capillary: 135 mg/dL — ABNORMAL HIGH (ref 65–99)

## 2016-05-12 LAB — CBC
HEMATOCRIT: 25.9 % — AB (ref 36.0–46.0)
HEMOGLOBIN: 8.2 g/dL — AB (ref 12.0–15.0)
MCH: 28.6 pg (ref 26.0–34.0)
MCHC: 31.7 g/dL (ref 30.0–36.0)
MCV: 90.2 fL (ref 78.0–100.0)
Platelets: 121 10*3/uL — ABNORMAL LOW (ref 150–400)
RBC: 2.87 MIL/uL — ABNORMAL LOW (ref 3.87–5.11)
RDW: 17.2 % — ABNORMAL HIGH (ref 11.5–15.5)
WBC: 6.5 10*3/uL (ref 4.0–10.5)

## 2016-05-12 LAB — BASIC METABOLIC PANEL
Anion gap: 7 (ref 5–15)
BUN: 20 mg/dL (ref 6–20)
CO2: 22 mmol/L (ref 22–32)
CREATININE: 1.66 mg/dL — AB (ref 0.44–1.00)
Calcium: 8.3 mg/dL — ABNORMAL LOW (ref 8.9–10.3)
Chloride: 108 mmol/L (ref 101–111)
GFR calc Af Amer: 31 mL/min — ABNORMAL LOW (ref >60)
GFR calc non Af Amer: 27 mL/min — ABNORMAL LOW (ref >60)
GLUCOSE: 127 mg/dL — AB (ref 65–99)
Potassium: 4.4 mmol/L (ref 3.5–5.1)
Sodium: 137 mmol/L (ref 135–145)

## 2016-05-12 NOTE — Clinical Social Work Placement (Signed)
   CLINICAL SOCIAL WORK PLACEMENT  NOTE  Date:  05/12/2016  Patient Details  Name: Leslie Coleman MRN: [id number] Date of Birth: 1928-11-27  Clinical Social Work is seeking post-discharge placement for this patient at the [place] level of care (*CSW will initial, date and re-position this form in  chart as items are completed):  Yes   Patient/family provided with [place] Work Department's list of facilities offering this level of care within the geographic area requested by the patient (or if unable, by the patient's family).  Yes   Patient/family informed of their freedom to choose among providers that offer the needed level of care, that participate in Medicare, Medicaid or managed care program needed by the patient, have an available bed and are willing to accept the patient.  Yes   Patient/family informed of [place]'s ownership interest in [facility] and [facility], as well as of the fact that they are under no obligation to receive care at these facilities.  PASRR submitted to EDS on 05/10/16     PASRR number received on 05/10/16     Existing PASRR number confirmed on       FL2 transmitted to all facilities in geographic area requested by pt/family on 05/10/16     FL2 transmitted to all facilities within larger geographic area on       Patient informed that his/her managed care company has contracts with or will negotiate with certain facilities, including the following:            Patient/family informed of bed offers received. 05/12/2016   Patient chooses bed at      [facility] recommends and patient chooses bed at      SNF Patient to be transferred to   on  .  05/12/2016   Patient to be transferred to facility by     EMS  Patient family notified on   of transfer.  Daughter/husband  Name of family member notified:        PHYSICIAN Please sign FL2     Additional Comment:     _______________________________________________ Lilly Cove, LCSW 05/12/2016, 9:12 AM

## 2016-05-12 NOTE — Care Management Important Message (Signed)
Important Message  Patient Details  Name: Leslie Coleman MRN: [id number] Date of Birth: May 07, 1929   Medicare Important Message Given:  Yes    Leslie Coleman 05/12/2016, 11:04 AM

## 2016-05-12 NOTE — Progress Notes (Signed)
Patient urinated 100cc this evening. Bladder scan showed 300cc. Notified hospitalist. Awaiting further orders

## 2016-05-12 NOTE — Progress Notes (Addendum)
2:06 PM Orders received for discharge. Family notified in room. Transport called with arrival around 3:00pm. DC to SNF.  New DC summary sent to facility, through the [place].  Patient has accepted a bed at [facility] and SNF is willing to accept today. Awaiting MD to review and medically clear patient.  Will facilitate DC once orders received.  SNF packet completed. Documents sent to SNF for review. Patient will transport by EMS.  Lane Hacker, MSW Clinical Social Work: Printmaker Coverage for :  [phone]

## 2016-05-12 NOTE — Progress Notes (Signed)
Report given to ULU, Therapist, sports at [employer] .

## 2016-05-12 NOTE — Progress Notes (Signed)
Discharge patient to [facility], transported by [facility] [facility].

## 2016-05-12 NOTE — Progress Notes (Signed)
PT Cancellation Note  Patient Details Name: Leslie Coleman MRN: [id number] DOB: 1928/06/27   Cancelled Treatment:     Attempted to see. Pt planning to d/c to SNF this afternoon for continued rehabilitation services. All further PT notes can be met at the next venue of care. Will defer PT to SNF. PT will sign off acutely.    Tonia Brooms 05/12/2016, 3:00 PM  Tonia Brooms, SPT [phone]

## 2016-05-12 NOTE — Progress Notes (Signed)
OT Cancellation Note  Patient Details Name: Leslie Coleman MRN: [id number] DOB: 1929/02/06   Cancelled Treatment:    Reason Eval/Treat Not Completed: Other (comment): OT screened. Pt planning D/C to SNF this afternoon for continued rehabilitation services. All further OT needs can be met at next venue of care. Will defer OT to SNF. OT will sign off acutely.  [address], OTR/L [phone] 05/12/2016, 2:22 PM

## 2016-05-12 NOTE — Discharge Summary (Addendum)
Physician Discharge Summary  Leslie Coleman QJF:[id number] DOB: [birth year] DOA: 05/08/2016  PCP: [name], MD  Admit date: 05/08/2016 Discharge date: 05/12/2016  Admitted From: Home Disposition:  Skilled nursing facility  Recommendations for Outpatient Follow-up:  1. Follow-up with M.D. at SNF in 1 week. 2. Please monitor H&H within one week. 3. Follow-up with Dr [name] in 2 weeks 4. Recommend non-rib-bearing over operative extremity, range of motion as tolerated within the brace. Patient being discharged on Lovenox for 4 weeks ( for postoperative DVT prophylaxis).   Equipment/Devices: As per therapy at the facility  Discharge Condition: Fair CODE STATUS:Full code Diet recommendation: Diabetic   Brief narrative/history of present illness Please refer to admission H&P for details, in brief, 82 year old female with history of hypertension, hypothyroidism, diabetes mellitus and anemia presented to the ED [place] She tripped on the rug and fell onto her right knee. At baseline see ambulates with the walker and cane. After the fall she was unable to beer weight on her right leg. She denied any loss of consciousness or sustaining any other injury. In the ED workup done showed comminuted and depressed lateral tibial PO2 fracture consistent with Schatzker II classification.  Orthopedics consulted and patient taken to the OR for open reduction and internal fixation of the right tibial fracture.  Discharge Diagnoses:  Principal Problem:   Closed fracture of right tibial plateau Underwent ORIF of the right tibial fracture on 12/20. Tolerated well. Currently on oral pain medications (oxycodone as needed) and tolerating well. Prescribed Robaxin as needed for muscle spasms. Added bowel regimen. Followed by mouth. Postop and recommends nonweightbearing on the operative leg. She is being discharged on Lovenox for 4 weeks of DVT prophylaxis. Seen by physical  therapy and recommended SNF.  Type 2 diabetes mellitus, controlled On oral hypoglycemic. Continue as outpatient.   Chronic kidney disease stage 3-4 Stable. On Micardis which can be continued.   Hypothyroidism Continue Synthroid.    Anemia of chronic kidney disease Received Procrit as outpatient. Has mild postoperative blood was anemia but stable. Monitor hemoglobin within one week.  Hyperlipidemia Continue statin.  Chronic Gout Stable. Continue allopurinol.  SVT On amiodarone. Stable.   Essential hypertension  stable. Continue Micardis, amlodipine and hydralazine.    Family communication: Husband at bedside, discussed with daughter on the phone  Consults: Orthopedics Dr. [name]  Procedure ORIF of right tibial fracture  Disposition: SNF   Discharge Instructions  Discharge Instructions    Diet - low sodium, carb modified   Complete by:  As directed    Increase activity slowly    Complete by:  As directed      Allergies as of 05/12/2016   No Known Allergies     Medication List    STOP taking these medications   allopurinol 300 MG tablet Commonly known as:  ZYLOPRIM   naproxen sodium 220 MG tablet Commonly known as:  ANAPROX   potassium chloride 10 MEQ tablet Commonly known as:  KLOR-CON 10     TAKE these medications   acetaminophen 500 MG tablet Commonly known as:  TYLENOL Take 500 mg by mouth at bedtime.   amiodarone 100 MG tablet Commonly known as:  PACERONE Take 1 tablet (100 mg total) by mouth every morning.   amLODipine 10 MG tablet Commonly known as:  NORVASC Take 10 mg by mouth at bedtime.   enoxaparin 30 MG/0.3ML injection Commonly known as:  LOVENOX Inject 0.3 mLs (30 mg total) into the skin  daily.   glipiZIDE 5 MG 24 hr tablet Commonly known as:  GLUCOTROL XL Take 5 mg by mouth daily.   hydrALAZINE 50 MG tablet Commonly known as:  APRESOLINE TAKE 1 TABLET BY MOUTH TWICE A DAY   levothyroxine 100 MCG tablet Commonly known  as:  SYNTHROID, LEVOTHROID Take 1 tablet (100 mcg total) by mouth daily before breakfast.   methocarbamol 500 MG tablet Commonly known as:  ROBAXIN Take 1 tablet (500 mg total) by mouth every 6 (six) hours as needed for muscle spasms.   multivitamin with minerals Tabs tablet Take 1 tablet by mouth daily.   omega-3 acid ethyl esters 1 g capsule Commonly known as:  LOVAZA TAKE 2 CAPSULES BY MOUTH 2 TIMES DAILY.   oxyCODONE 5 MG immediate release tablet Commonly known as:  Oxy IR/ROXICODONE Take 1-2 tablets (5-10 mg total) by mouth every 4 (four) hours.   oxymetazoline 0.05 % nasal spray Commonly known as:  AFRIN NASAL SPRAY Place 1 spray into both nostrils 2 (two) times daily as needed (nosebleed, no more than 3 days).   polyethylene glycol packet Commonly known as:  MIRALAX / GLYCOLAX Take 17 g by mouth at bedtime.   pravastatin 20 MG tablet Commonly known as:  PRAVACHOL TAKE 1 TABLET BY MOUTH AT BEDTIME   PROCRIT IJ Inject 10,000 Units as directed every 28 (twenty-eight) days. As directed every 4 weeks.   senna-docusate 8.6-50 MG tablet Commonly known as:  Senokot-S Take 1 tablet by mouth at bedtime as needed for mild constipation.   telmisartan-hydrochlorothiazide 80-25 MG tablet Commonly known as:  MICARDIS HCT Take 1 tablet by mouth daily.            Durable Medical Equipment        Start     Ordered   05/09/16 1126  For home use only DME 3 n 1  Once     05/09/16 1126   05/09/16 1125  For home use only DME standard manual wheelchair with seat cushion  Once    Comments:  Patient suffers from Tibial plateau fracture which impairs their ability to perform daily activities like ambulating  in the home.  A cane will not resolve  issue with performing activities of daily living. A wheelchair will allow patient to safely perform daily activities. Patient can safely propel the wheelchair in the home or has a caregiver who can provide assistance.  Accessories:  elevating leg rests (ELRs), wheel locks, extensions and anti-tippers.   05/09/16 1125      Contact information for follow-up providers    Eduard Roux, MD Follow up in 2 week(s).   Specialty:  Orthopedic Surgery Why:  For suture removal, For wound re-check Contact information: [place] [id number] [phone]            Contact information for after-discharge care    Destination    [facility] Follow up.   Specialty:  Skilled Nursing Facility Contact information: [address] [place] [phone]                 No Known Allergies    Procedures/Studies: Ct Knee Right Wo Contrast  Result Date: 05/08/2016 CLINICAL DATA:  Right knee pain EXAM: CT OF THE RIGHT KNEE WITHOUT CONTRAST TECHNIQUE: Multidetector CT imaging of the RIGHT knee was performed according to the standard protocol. Multiplanar CT image reconstructions were also generated. COMPARISON:  None. FINDINGS: Bones/Joint/Cartilage Comminuted and depressed lateral tibial  plateau fracture. 10 mm of depression of the articular surface. Area of depression measures 2.5 x 2.3 cm. Severe osteopenia. No other fracture or dislocation. Mild osteoarthritis of the medial femorotibial compartment. Large joint effusion. Small Baker cyst. Ligaments Suboptimally assessed by CT. Muscles and Tendons Muscles are normal. No significant muscle atrophy. Intact quadriceps tendon and patellar tendon. Soft tissues No fluid collection or hematoma. No soft tissue mass. There is peripheral vascular atherosclerotic disease. IMPRESSION: 1. Comminuted and depressed lateral tibial plateau fracture most consistent with Schatzker II classification. Electronically Signed   By: Kathreen Devoid   On: 05/08/2016 10:00   Dg Knee Complete 4 Views Right  Result Date: 05/10/2016 CLINICAL DATA:  Right tibia ORIF EXAM: DG C-ARM 61-120 MIN; RIGHT KNEE - COMPLETE 4+ VIEW COMPARISON:   None. FINDINGS: Four fluoroscopic images obtained during ORIF of the right tibia are provided with the reported fluoroscopy time of 1 minutes 46 seconds. There is a screw plate fixation along the lateral surface of the proximal right tibia without hardware adverse features. IMPRESSION: Intraoperative images during right tibia ORIF. Electronically Signed   By: Ulyses Jarred M.D.   On: 05/10/2016 19:15   Dg Knee Complete 4 Views Right  Result Date: 05/08/2016 CLINICAL DATA:  Post fall stepping [place] a door at church yesterday. EXAM: RIGHT KNEE - COMPLETE 4+ VIEW COMPARISON:  Right hip radiographs-earlier same day FINDINGS: Osteopenia. There is a potential minimally displaced fracture involving the weight-bearing surface of the lateral aspects of the tibial plateau with associated small knee joint effusion. No definitive evidence of lipohemarthrosis. Mild tricompartmental degenerative change of the knee with joint space loss, subchondral sclerosis osteophytosis. Query minimal amount of chondrocalcinosis within the medial and lateral joint spaces. Vascular calcifications.  No radiopaque foreign body. IMPRESSION: Osteopenia with potential minimally displaced fracture involving the lateral weight-bearing aspect of the tibial plateau. Correlation for tenderness at this location is recommended. Further evaluation with knee CT could be performed as indicated. Electronically Signed   By: Sandi Mariscal M.D.   On: 05/08/2016 08:33   Dg C-arm 1-60 Min  Result Date: 05/10/2016 CLINICAL DATA:  Right tibia ORIF EXAM: DG C-ARM 61-120 MIN; RIGHT KNEE - COMPLETE 4+ VIEW COMPARISON:  None. FINDINGS: Four fluoroscopic images obtained during ORIF of the right tibia are provided with the reported fluoroscopy time of 1 minutes 46 seconds. There is a screw plate fixation along the lateral surface of the proximal right tibia without hardware adverse features. IMPRESSION: Intraoperative images during right tibia ORIF.  Electronically Signed   By: Ulyses Jarred M.D.   On: 05/10/2016 19:15   Dg Hip Unilat W Or Wo Pelvis 2-3 Views Right  Result Date: 05/08/2016 CLINICAL DATA:  Post fall stepping through a door at church yesterday. EXAM: DG HIP (WITH OR WITHOUT PELVIS) 2-3V RIGHT COMPARISON:  None. FINDINGS: No fracture or dislocation. Right hip joint spaces are preserved. No evidence avascular necrosis. Limited visualization of the adjacent pelvis is negative for displaced fracture. Moderate severe multilevel lumbar spine DDD is suspected though incompletely evaluated. Several phleboliths overlie the lower pelvis bilaterally. Suspected bone island overlies the cranial aspect of the left ilium, incompletely evaluated. Vascular calcifications. No radiopaque foreign body. IMPRESSION: 1. No displaced right-sided rib fracture. 2. Moderate severe multilevel lumbar spine DDD is suspected though incompletely evaluated. Electronically Signed   By: Sandi Mariscal M.D.   On: 05/08/2016 08:34    (Echo, Carotid, EGD, Colonoscopy, ERCP)    Subjective: Complains of occasional pain in her right  leg controlled with pain medication.  Discharge Exam: Vitals:   05/11/16 2055 05/12/16 0642  BP: (!) 118/59 (!) 128/45  Pulse: 67 76  Resp: 20 18  Temp: 99.7 F (37.6 C) 98.4 F (36.9 C)   Vitals:   05/11/16 0813 05/11/16 1505 05/11/16 2055 05/12/16 0642  BP:  (!) 140/51 (!) 118/59 (!) 128/45  Pulse:  81 67 76  Resp:  18 20 18   Temp:  98.1 F (36.7 C) 99.7 F (37.6 C) 98.4 F (36.9 C)  TempSrc:  Oral Oral Oral  SpO2:  95% 94% 90%  Weight: 72 kg (158 lb 11.7 oz)     Height: 5\' 5"  (1.651 m)       General: Elderly female not in distress HEENT: Pallor present, moist mucosa, supple neck Chest: Clear to auscultation bilaterally CVS: Normal S1 and S2, no murmurs rub or gallop GI: Soft, nondistended, nontender Musculoskeletal: Warm, no edema, right leg brace with Ace wrap CNS: Alert and oriented     The results of  significant diagnostics from this hospitalization (including imaging, microbiology, ancillary and laboratory) are listed below for reference.     Microbiology: Recent Results (from the past 240 hour(s))  Surgical pcr screen     Status: None   Collection Time: 05/10/16  5:03 AM  Result Value Ref Range Status   MRSA, PCR NEGATIVE NEGATIVE Final   Staphylococcus aureus NEGATIVE NEGATIVE Final    Comment:        The Xpert SA Assay (FDA approved for NASAL specimens in patients over 59 years of age), is one component of a comprehensive surveillance program.  Test performance has been validated by [facility] for patients greater than or equal to 6 year old. It is not intended to diagnose infection nor to guide or monitor treatment.      Labs: BNP (last 3 results) No results for input(s): BNP in the last 8760 hours. Basic Metabolic Panel:  Recent Labs Lab 05/08/16 1141 05/09/16 0750 05/10/16 0336 05/11/16 0433 05/12/16 0455  NA 142 142 137 139 137  K 3.9 4.3 4.2 4.0 4.4  CL 113* 113* 110 111 108  CO2 20* 20* 19* 20* 22  GLUCOSE 127* 85 113* 82 127*  BUN 28* 26* 25* 21* 20  CREATININE 1.89* 2.03* 1.98* 1.80* 1.66*  CALCIUM 8.8* 8.8* 8.4* 8.4* 8.3*   Liver Function Tests:  Recent Labs Lab 05/10/16 0336 05/11/16 0433  AST 19 19  ALT 10* 8*  ALKPHOS 62 59  BILITOT 0.8 0.5  PROT 5.3* 5.2*  ALBUMIN 2.3* 2.3*   No results for input(s): LIPASE, AMYLASE in the last 168 hours. No results for input(s): AMMONIA in the last 168 hours. CBC:  Recent Labs Lab 05/08/16 1141 05/09/16 0750 05/10/16 0336 05/11/16 0433 05/12/16 0455  WBC 8.2 6.2 5.6 5.1 6.5  NEUTROABS 5.6  --   --   --   --   HGB 9.5* 9.5* 8.8* 8.3* 8.2*  HCT 28.4* 29.7* 27.2* 26.1* 25.9*  MCV 87.4 88.7 88.3 88.8 90.2  PLT 151 152 139* 122* 121*   Cardiac Enzymes: No results for input(s): CKTOTAL, CKMB, CKMBINDEX, TROPONINI in the last 168 hours. BNP: Invalid input(s): POCBNP CBG:  Recent  Labs Lab 05/11/16 1206 05/11/16 1705 05/11/16 2101 05/12/16 0731 05/12/16 1125  GLUCAP 115* 201* 184* 135* 137*   D-Dimer No results for input(s): DDIMER in the last 72 hours. Hgb A1c No results for input(s): HGBA1C in the last 72 hours. Lipid Profile No  results for input(s): CHOL, HDL, LDLCALC, TRIG, CHOLHDL, LDLDIRECT in the last 72 hours. Thyroid function studies No results for input(s): TSH, T4TOTAL, T3FREE, THYROIDAB in the last 72 hours.  Invalid input(s): FREET3 Anemia work up No results for input(s): VITAMINB12, FOLATE, FERRITIN, TIBC, IRON, RETICCTPCT in the last 72 hours. Urinalysis    Component Value Date/Time   COLORURINE YELLOW 09/29/2015 2051   APPEARANCEUR CLOUDY (A) 09/29/2015 2051   LABSPEC 1.017 09/29/2015 2051   PHURINE 5.5 09/29/2015 2051   GLUCOSEU NEGATIVE 09/29/2015 2051   HGBUR NEGATIVE 09/29/2015 2051   [place] 09/29/2015 2051   [place] 09/29/2015 2051   PROTEINUR 100 (A) 09/29/2015 2051   UROBILINOGEN 0.2 07/06/2011 2116   NITRITE POSITIVE (A) 09/29/2015 2051   LEUKOCYTESUR MODERATE (A) 09/29/2015 2051   Sepsis Labs Invalid input(s): PROCALCITONIN,  WBC,  LACTICIDVEN Microbiology Recent Results (from the past 240 hour(s))  Surgical pcr screen     Status: None   Collection Time: 05/10/16  5:03 AM  Result Value Ref Range Status   MRSA, PCR NEGATIVE NEGATIVE Final   Staphylococcus aureus NEGATIVE NEGATIVE Final    Comment:        The Xpert SA Assay (FDA approved for NASAL specimens in patients over 56 years of age), is one component of a comprehensive surveillance program.  Test performance has been validated by [facility] for patients greater than or equal to 64 year old. It is not intended to diagnose infection nor to guide or monitor treatment.      Time coordinating discharge: <30 minutes  SIGNED:   Louellen Molder, MD  [facility] 05/12/2016, 12:24 PM Pager   If 7PM-7AM, please contact  night-coverage [email or web address] Password [email or web address]

## 2016-05-17 ENCOUNTER — Encounter: Payer: Self-pay | Admitting: Internal Medicine

## 2016-05-17 ENCOUNTER — Non-Acute Institutional Stay (SKILLED_NURSING_FACILITY): Payer: Medicare Other | Admitting: Internal Medicine

## 2016-05-17 ENCOUNTER — Telehealth (INDEPENDENT_AMBULATORY_CARE_PROVIDER_SITE_OTHER): Payer: Self-pay | Admitting: *Deleted

## 2016-05-17 DIAGNOSIS — I472 Ventricular tachycardia, unspecified: Secondary | ICD-10-CM

## 2016-05-17 DIAGNOSIS — E1122 Type 2 diabetes mellitus with diabetic chronic kidney disease: Secondary | ICD-10-CM | POA: Diagnosis not present

## 2016-05-17 DIAGNOSIS — E034 Atrophy of thyroid (acquired): Secondary | ICD-10-CM | POA: Diagnosis not present

## 2016-05-17 DIAGNOSIS — E7800 Pure hypercholesterolemia, unspecified: Secondary | ICD-10-CM | POA: Diagnosis not present

## 2016-05-17 DIAGNOSIS — I10 Essential (primary) hypertension: Secondary | ICD-10-CM | POA: Diagnosis not present

## 2016-05-17 DIAGNOSIS — D631 Anemia in chronic kidney disease: Secondary | ICD-10-CM

## 2016-05-17 DIAGNOSIS — S82141D Displaced bicondylar fracture of right tibia, subsequent encounter for closed fracture with routine healing: Secondary | ICD-10-CM | POA: Diagnosis not present

## 2016-05-17 DIAGNOSIS — N183 Chronic kidney disease, stage 3 unspecified: Secondary | ICD-10-CM

## 2016-05-17 LAB — CBC AND DIFFERENTIAL
HCT: 28 % — AB (ref 36–46)
Hemoglobin: 8.6 g/dL — AB (ref 12.0–16.0)
Platelets: 204 10*3/uL (ref 150–399)
WBC: 5.5 10^3/mL

## 2016-05-17 LAB — BASIC METABOLIC PANEL
BUN: 41 mg/dL — AB (ref 4–21)
Creatinine: 1.9 mg/dL — AB (ref 0.5–1.1)
GLUCOSE: 56 mg/dL
POTASSIUM: 5.3 mmol/L (ref 3.4–5.3)
SODIUM: 136 mmol/L — AB (ref 137–147)

## 2016-05-17 NOTE — Telephone Encounter (Signed)
Wait to take dressing off until post op visit or ? See message below.

## 2016-05-17 NOTE — Progress Notes (Signed)
: Provider:  Noah Delaine. Sheppard Coil, MD Location:  [facility] Room Number: 160F Place of Service:  SNF (31)  PCP: [name], MD Patient Care Team: [name], [name], MD as Consulting Physician (Orthopedic Surgery)  Extended Emergency Contact Information Primary Emergency Contact: Dawkins,Ervin Address: [place], [place] Phone: [phone] Relation: Spouse Secondary Emergency Contact: Manning,Cindy  [place] Mobile Phone: [phone] Relation: Daughter     Allergies: Patient has no known allergies.  Chief Complaint  Patient presents with  . New Admit To SNF    Admit to SNF    HPI: Patient is 82 y.o. female with hypertension, hypothyroidism, diabetes mellitus and anemia presented to the ED [place] She tripped on the rug and fell onto her right knee. At baseline see ambulates with the walker and cane. After the fall she was unable to beer weight on her right leg. She denied any loss of consciousness or sustaining any other injury. In the ED workup done showed comminuted and depressed lateral tibial PO2 fracture consistent with Schatzker II classification. Pt as admitted to [facility] from [facility]22 where pt underwent surgery on her knee with post-op blood loss not requiring a transfusion. Pt is admitted to SNF for OT/PT. Pt will be followed for HTN, tx with norvasc,avapro/HCTZ and hydralazine, Hx VT, tx with amiodarone and hypothyroidism, tx with synthroid.  Past Medical History:  Diagnosis Date  . Abnormal PFTs (pulmonary function tests) 2008   No obstruction noted. Has been followed clinically.  . Anemia    treated with Procrit  . Fall from slip, trip, or stumble 05/07/2016   fell at church resulting in right tibial plateau fracture  . Gout   . High risk medication use    on amiodarone.    Marland Kitchen HTN (hypertension)    Last echo in 2004; EF normal  . Hyperlipidemia   . Hypothyroidism    on replacement  . Obesity   . Type II diabetes mellitus ([place])   . V-tach [facility])    remote VT, treated initially with procainamide, on amiodarone since 2008    Past Surgical History:  Procedure Laterality Date  . CARDIOVASCULAR STRESS TEST  07/07/2003   EF 70%. NO EVIDENCE OF ISCHEMIA  . CARDIOVERSION  1978  . CATARACT EXTRACTION, BILATERAL Bilateral   . CESAREAN SECTION     x 3  . ORIF TIBIA PLATEAU Right 05/10/2016   Procedure: OPEN REDUCTION INTERNAL FIXATION (ORIF) RIGHT TIBIAL PLATEAU  WITH BONE GRAFT;  Surgeon: Leandrew Koyanagi, MD;  Location: [place] [facility];  Service: Orthopedics;  Laterality: Right;  . US ECHOCARDIOGRAPHY  12/29/2002   EF 60-65%  . VAGINAL HYSTERECTOMY      Allergies as of 05/17/2016   No Known Allergies     Medication List       Accurate as of 05/17/16 12:14 PM. Always use your most recent med list.          acetaminophen 500 MG tablet Commonly known as:  TYLENOL Take 500 mg by mouth at bedtime.   allopurinol 300 MG tablet Commonly known as:  ZYLOPRIM Take 300 mg by mouth daily.   amiodarone 100 MG tablet Commonly known as:  PACERONE Take 1 tablet (100 mg total) by mouth every morning.   amLODipine 10 MG tablet Commonly known as:  NORVASC Take 10 mg by mouth at bedtime.   enoxaparin 30 MG/0.3ML injection Commonly known as:  LOVENOX Inject 30 mg into the skin every 12 (twelve) hours.   feeding supplement (PRO-STAT SUGAR FREE 64) Liqd Take 30 mLs by mouth 2 (two) times daily. Wound healing   ferrous sulfate 325 (65 FE) MG tablet Take 325 mg by mouth daily with breakfast.   glipiZIDE 5 MG 24 hr tablet Commonly known as:  GLUCOTROL XL Take 5 mg by mouth daily.   GLUCERNA Liqd Take 237 mLs by mouth daily.   hydrALAZINE 50 MG tablet Commonly known as:  APRESOLINE TAKE 1 TABLET BY MOUTH TWICE A DAY   levothyroxine 100 MCG tablet Commonly known as:   SYNTHROID, LEVOTHROID Take 1 tablet (100 mcg total) by mouth daily before breakfast.   methocarbamol 500 MG tablet Commonly known as:  ROBAXIN Take 1 tablet (500 mg total) by mouth every 6 (six) hours as needed for muscle spasms.   multivitamin with minerals Tabs tablet Take 1 tablet by mouth daily.   naproxen sodium 220 MG tablet Commonly known as:  ANAPROX Take 220 mg by mouth every morning.   omega-3 acid ethyl esters 1 g capsule Commonly known as:  LOVAZA TAKE 2 CAPSULES BY MOUTH 2 TIMES DAILY.   oxyCODONE-acetaminophen 5-325 MG tablet Commonly known as:  PERCOCET/ROXICET Take 1 tablet by mouth every 6 (six) hours as needed for severe pain.   oxymetazoline 0.05 % nasal spray Commonly known as:  AFRIN NASAL SPRAY Place 1 spray into both nostrils 2 (two) times daily as needed (nosebleed, no more than 3 days).   pravastatin 20 MG tablet Commonly known as:  PRAVACHOL TAKE 1 TABLET BY MOUTH AT BEDTIME   PROCRIT IJ Inject 10,000 Units as directed every 28 (twenty-eight) days. As directed every 4 weeks.   telmisartan-hydrochlorothiazide 80-25 MG tablet Commonly known as:  MICARDIS HCT Take 1 tablet by mouth daily.       Meds ordered this encounter  Medications  . Amino Acids-Protein Hydrolys (FEEDING SUPPLEMENT, PRO-STAT SUGAR FREE 64,) LIQD    Sig: Take 30 mLs by mouth 2 (two) times daily. Wound healing  . GLUCERNA (GLUCERNA) LIQD    Sig: Take 237 mLs by mouth daily.  . ferrous sulfate 325 (65 FE) MG tablet    Sig: Take 325 mg by mouth daily with breakfast.  . oxyCODONE-acetaminophen (PERCOCET/ROXICET) 5-325 MG tablet    Sig: Take 1 tablet by mouth every 6 (six) hours as needed for severe pain.  Marland Kitchen enoxaparin (LOVENOX) 30 MG/0.3ML injection    Sig: Inject 30 mg into the skin every 12 (twelve) hours.  . naproxen sodium (ANAPROX) 220 MG tablet    Sig: Take 220 mg by mouth every morning.  Marland Kitchen allopurinol (ZYLOPRIM) 300 MG tablet    Sig: Take 300 mg by mouth daily.     Immunization History  Administered Date(s) Administered  . PPD Test 05/12/2016    Social History  Substance Use Topics  . Smoking status: Never Smoker  . Smokeless tobacco: Never Used  . Alcohol use No    Family history is   Family History  Problem Relation Age of Onset  . Heart disease Mother   . Heart disease Father       Review of Systems  DATA OBTAINED: from patient, nurse GENERAL:  no fevers, fatigue, appetite changes SKIN: No itching, or rash EYES: No eye pain, redness, discharge EARS: No earache, tinnitus, change in hearing NOSE: No congestion,  drainage or bleeding  MOUTH/THROAT: No mouth or tooth pain, No sore throat RESPIRATORY: No cough, wheezing, SOB CARDIAC: No chest pain, palpitations, lower extremity edema  GI: No abdominal pain, No N/V/D or constipation, No heartburn or reflux  GU: No dysuria, frequency or urgency, or incontinence  MUSCULOSKELETAL: No unrelieved bone/joint pain NEUROLOGIC: No headache, dizziness or focal weakness PSYCHIATRIC: No c/o anxiety or sadness   Vitals:   05/17/16 1141  BP: (!) 149/71  Pulse: 88  Resp: (!) 22  Temp: 98.1 F (36.7 C)    SpO2 Readings from Last 1 Encounters:  05/17/16 92%   Body mass index is 26.29 kg/m.     Physical Exam  GENERAL APPEARANCE: Alert, conversant,  No acute distress.  SKIN: No diaphoresis rash HEAD: Normocephalic, atraumatic  EYES: Conjunctiva/lids clear. Pupils round, reactive. EOMs intact.  EARS: External exam WNL, canals clear. Hearing grossly normal.  NOSE: No deformity or discharge.  MOUTH/THROAT: Lips w/o lesions  RESPIRATORY: Breathing is even, unlabored. Lung sounds are clear   CARDIOVASCULAR: Heart RRR 3/6 murmur, no rubs or gallops. No peripheral edema.   GASTROINTESTINAL: Abdomen is soft, non-tender, not distended w/ normal bowel sounds. GENITOURINARY: Bladder non tender, not distended  MUSCULOSKELETAL:leg in knee immobolizer NEUROLOGIC:  Cranial nerves 2-12  grossly intact. Moves all extremities  PSYCHIATRIC: Mood and affect appropriate to situation, no behavioral issues  Patient Active Problem List   Diagnosis Date Noted  . Closed fracture of right tibial plateau 05/12/2016  . Anemia in chronic kidney disease 12/16/2015  . Dizzy 07/07/2011  . Dehydration 07/07/2011  . Diabetes mellitus ([place]) 07/06/2011  . History of long-term treatment with high-risk medication 08/29/2010  . Abnormal PFTs (pulmonary function tests)   . V-tach ([place])   . HTN (hypertension)   . Hyperlipidemia   . Gout       Labs reviewed: Basic Metabolic Panel:    Component Value Date/Time   NA 137 05/12/2016 0455   NA 139 05/11/2016 0433   K 4.4 05/12/2016 0455   CL 108 05/12/2016 0455   CO2 22 05/12/2016 0455   GLUCOSE 127 (H) 05/12/2016 0455   BUN 20 05/12/2016 0455   BUN 21 05/11/2016 0433   CREATININE 1.66 (H) 05/12/2016 0455   CREATININE 1.99 (H) 04/04/2016 0910   CALCIUM 8.3 (L) 05/12/2016 0455   PROT 5.2 (L) 05/11/2016 0433   ALBUMIN 2.3 (L) 05/11/2016 0433   AST 19 05/11/2016 0433   ALT 8 (L) 05/11/2016 0433   ALKPHOS 59 05/11/2016 0433   BILITOT 0.5 05/11/2016 0433   GFRNONAA 27 (L) 05/12/2016 0455   GFRAA 31 (L) 05/12/2016 0455     Recent Labs  05/10/16 0336 05/11/16 0433 05/12/16 0455  NA 137  137 139  139 137  K 4.2  4.2 4.0  4.0 4.4  CL 110 111 108  CO2 19* 20* 22  GLUCOSE 113* 82 127*  BUN 25*  25* 21  21* 20  CREATININE 1.98*  2.0* 1.80*  1.8* 1.66*  CALCIUM 8.4* 8.4* 8.3*   Liver Function Tests:  Recent Labs  04/04/16 0910 05/10/16 0336 05/11/16 0433  AST 20 19 19   ALT 10 10* 8*  ALKPHOS 94 62 59  BILITOT 0.3 0.8 0.5  PROT 6.7 5.3* 5.2*  ALBUMIN 3.6 2.3* 2.3*   No results for input(s): LIPASE, AMYLASE in the last 8760 hours. No results for input(s): AMMONIA in the last 8760 hours. CBC:  Recent Labs  09/29/15 1841  05/08/16 1141  05/10/16 0336  05/11/16 0433 05/12/16 0455  WBC 20.6*  < > 8.2  8.2   < > 5.6  5.6 5.1  5.1 6.5  NEUTROABS 16.6*  --  5.6  --   --   --   --   HGB 9.8*  < > 9.5*  9.5*  < > 8.8*  8.8* 8.3*  8.3* 8.2*  HCT 29.4*  < > 28.4*  28*  < > 27.2*  27* 26.1*  26* 25.9*  MCV 86.7  < > 87.4  < > 88.3 88.8 90.2  PLT 106*  < > 151  151  < > 139*  139* 122*  122* 121*  < > = values in this interval not displayed. Lipid  Recent Labs  11/01/15 0846 04/04/16 0910  CHOL 104* 106  HDL 23* 21*  LDLCALC 47 43  TRIG 171* 210*    Cardiac Enzymes: No results for input(s): CKTOTAL, CKMB, CKMBINDEX, TROPONINI in the last 8760 hours. BNP: No results for input(s): BNP in the last 8760 hours. No results found for: [facility] Lab Results  Component Value Date   HGBA1C 6.2 (H) 07/07/2011   Lab Results  Component Value Date   TSH 1.45 04/04/2016   No results found for: VITAMINB12 No results found for: FOLATE No results found for: IRON, TIBC, FERRITIN  Imaging and Procedures obtained prior to SNF admission: Ct Knee Right Wo Contrast  Result Date: 05/08/2016 CLINICAL DATA:  Right knee pain EXAM: CT OF THE RIGHT KNEE WITHOUT CONTRAST TECHNIQUE: Multidetector CT imaging of the RIGHT knee was performed according to the standard protocol. Multiplanar CT image reconstructions were also generated. COMPARISON:  None. FINDINGS: Bones/Joint/Cartilage Comminuted and depressed lateral tibial plateau fracture. 10 mm of depression of the articular surface. Area of depression measures 2.5 x 2.3 cm. Severe osteopenia. No other fracture or dislocation. Mild osteoarthritis of the medial femorotibial compartment. Large joint effusion. Small Baker cyst. Ligaments Suboptimally assessed by CT. Muscles and Tendons Muscles are normal. No significant muscle atrophy. Intact quadriceps tendon and patellar tendon. Soft tissues No fluid collection or hematoma. No soft tissue mass. There is peripheral vascular atherosclerotic disease. IMPRESSION: 1. Comminuted and depressed lateral tibial plateau  fracture most consistent with Schatzker II classification. Electronically Signed   By: Kathreen Devoid   On: 05/08/2016 10:00   Dg Knee Complete 4 Views Right  Result Date: 05/08/2016 CLINICAL DATA:  Post fall [place] a door at church yesterday. EXAM: RIGHT KNEE - COMPLETE 4+ VIEW COMPARISON:  Right hip radiographs-earlier same day FINDINGS: Osteopenia. There is a potential minimally displaced fracture involving the weight-bearing surface of the lateral aspects of the tibial plateau with associated small knee joint effusion. No definitive evidence of lipohemarthrosis. Mild tricompartmental degenerative change of the knee with joint space loss, subchondral sclerosis osteophytosis. Query minimal amount of chondrocalcinosis within the medial and lateral joint spaces. Vascular calcifications.  No radiopaque foreign body. IMPRESSION: Osteopenia with potential minimally displaced fracture involving the lateral weight-bearing aspect of the tibial plateau. Correlation for tenderness at this location is recommended. Further evaluation with knee CT could be performed as indicated. Electronically Signed   By: Sandi Mariscal M.D.   On: 05/08/2016 08:33   Dg Hip Unilat W Or Wo Pelvis 2-3 Views Right  Result Date: 05/08/2016 CLINICAL DATA:  Post fall stepping [place] a door at church yesterday. EXAM: DG HIP (WITH OR WITHOUT PELVIS) 2-3V RIGHT COMPARISON:  None. FINDINGS: No fracture or dislocation. Right hip joint spaces are preserved. No evidence avascular  necrosis. Limited visualization of the adjacent pelvis is negative for displaced fracture. Moderate severe multilevel lumbar spine DDD is suspected though incompletely evaluated. Several phleboliths overlie the lower pelvis bilaterally. Suspected bone island overlies the cranial aspect of the left ilium, incompletely evaluated. Vascular calcifications. No radiopaque foreign body. IMPRESSION: 1. No displaced right-sided rib fracture. 2. Moderate severe multilevel  lumbar spine DDD is suspected though incompletely evaluated. Electronically Signed   By: Sandi Mariscal M.D.   On: 05/08/2016 08:34     Not all labs, radiology exams or other studies done during hospitalization come through on my EPIC note; however they are reviewed by me.    Assessment and Plan  R TIBIAL PLATEAU FRACTURE-Underwent ORIF of the right tibial fracture on 12/20. Tolerated well; 4 WEEKS OF POST -OP LOVENOX SNF - admitted for OT/PT; cont lovenox for 4 weeks fpr prophylaxis  DM2 SNF -  A1c 6.2 ;cont glipizide 5 mg daily  CKD3 - baseline Cr 1.8-2.0 SNF - will f/u BMP  HTN SNF - stable; cont norvasc 10 mg daily, hydralazine 50 mg BID, micardis-HCTZ  HYPOTHYROIDISM SNF - cont synthroid 100 mcg daily ;TSH in 4-6 weeks  H/O [facility] SNF -cont amiodarone 100 mg daily  HLD SNF - not stated as unconrolled; cont lovaza 1 gm daily  ANEMIA OF CKD SNF - D/C hB 8.2; WILL F/U bmp; cont procrit q 4 weeks    Time spent > 45 min;> 50% of time with patient was spent reviewing records, labs, tests and studies, counseling and developing plan of care  Webb Silversmith D. Sheppard Coil, MD

## 2016-05-17 NOTE — Telephone Encounter (Signed)
Myriam Jacobson from [facility] is calling for wound care orders for pts rt knee.

## 2016-05-18 NOTE — Telephone Encounter (Signed)
Dry dressing to incision daily

## 2016-05-18 NOTE — Telephone Encounter (Signed)
Leslie Coleman is callig again about wound care instructions, she stares pt has been there for a couple days and has not had any orders.

## 2016-05-19 NOTE — Telephone Encounter (Signed)
Front desk receptionist took message since she couldn't get nurse on the phone

## 2016-05-20 ENCOUNTER — Encounter: Payer: Self-pay | Admitting: Internal Medicine

## 2016-05-20 DIAGNOSIS — N183 Chronic kidney disease, stage 3 unspecified: Secondary | ICD-10-CM | POA: Insufficient documentation

## 2016-05-20 DIAGNOSIS — E039 Hypothyroidism, unspecified: Secondary | ICD-10-CM | POA: Insufficient documentation

## 2016-05-23 ENCOUNTER — Ambulatory Visit (INDEPENDENT_AMBULATORY_CARE_PROVIDER_SITE_OTHER): Payer: No Typology Code available for payment source

## 2016-05-23 ENCOUNTER — Encounter (INDEPENDENT_AMBULATORY_CARE_PROVIDER_SITE_OTHER): Payer: Self-pay | Admitting: Orthopaedic Surgery

## 2016-05-23 ENCOUNTER — Ambulatory Visit (INDEPENDENT_AMBULATORY_CARE_PROVIDER_SITE_OTHER): Payer: Medicare Other | Admitting: Orthopaedic Surgery

## 2016-05-23 DIAGNOSIS — S82141D Displaced bicondylar fracture of right tibia, subsequent encounter for closed fracture with routine healing: Secondary | ICD-10-CM

## 2016-05-23 NOTE — Progress Notes (Signed)
2 weeks s/p ORIF right tibial plateau.  Living at SNF.  Doing well.  Incision c/d/i.  No signs of infection.  xrays show stable fixation and joint line.  Staples removed today.  Continue NWB for at least 4 more weeks.  bledsoe with OOB and up with PT. F/u 4 weeks repeat xrays of right knee

## 2016-05-24 ENCOUNTER — Other Ambulatory Visit: Payer: Self-pay | Admitting: *Deleted

## 2016-05-24 NOTE — Patient Outreach (Signed)
Met with patient and spouse at facility.  RNCM discussed [facility] care management program. Patient states she is doing well, has had f/u with her specialist, she is working with Therapy. She states she has supportive family, who transports. She denies any issues with obtaining medications.  Patient reports she does not feel need for [facility] at this time.  Patient given brochure and magnet.  Plan RNCM will sign off, will let SW at facility know that this RNCM can be re consulted if discharge needs arise for [facility] care management  Royetta Crochet. Laymond Purser, RN, BSN, [facility] Care Coordination [phone]

## 2016-05-29 ENCOUNTER — Encounter (HOSPITAL_COMMUNITY): Payer: Medicare Other

## 2016-06-06 ENCOUNTER — Encounter: Payer: Self-pay | Admitting: Internal Medicine

## 2016-06-06 ENCOUNTER — Non-Acute Institutional Stay (SKILLED_NURSING_FACILITY): Payer: Medicare Other | Admitting: Internal Medicine

## 2016-06-06 DIAGNOSIS — B952 Enterococcus as the cause of diseases classified elsewhere: Secondary | ICD-10-CM

## 2016-06-06 DIAGNOSIS — A498 Other bacterial infections of unspecified site: Secondary | ICD-10-CM

## 2016-06-06 DIAGNOSIS — N390 Urinary tract infection, site not specified: Secondary | ICD-10-CM | POA: Diagnosis not present

## 2016-06-06 NOTE — Progress Notes (Signed)
Location:  [place] Room Number: 103P Place of Service:  SNF (31)  Leslie Coleman. Leslie Coil, MD  Patient Care Team: [name], [name], MD as Consulting Physician (Orthopedic Surgery)  Extended Emergency Contact Information Primary Emergency Contact: Sayer,Ervin Address: [place], [place] Phone: [phone] Relation: Spouse Secondary Emergency Contact: Manning,Cindy  [place] Mobile Phone: [phone] Relation: Daughter    Allergies: Patient has no known allergies.  Chief Complaint  Patient presents with  . Acute Visit    Acute    HPI: Patient is 83 y.o. female who is being followed up for an enterococcus and citrobacter UTI. U/A was done when pt presented with MS changes. Pt feels OK today , although she is in bed.  Past Medical History:  Diagnosis Date  . Abnormal PFTs (pulmonary function tests) 2008   No obstruction noted. Has been followed clinically.  . Anemia    treated with Procrit  . Fall from slip, trip, or stumble 05/07/2016   [place] at [place] resulting in right tibial plateau fracture  . Gout   . High risk medication use    on amiodarone.  Marland Kitchen HTN (hypertension)    Last echo in 2004; EF normal  . Hyperlipidemia   . Hypothyroidism    on replacement  . Obesity   . Type II diabetes mellitus ([place])   . V-tach [facility])    remote VT, treated initially with procainamide, on amiodarone since 2008    Past Surgical History:  Procedure Laterality Date  . CARDIOVASCULAR STRESS TEST  07/07/2003   EF 70%. NO EVIDENCE OF ISCHEMIA  . CARDIOVERSION  1978  . CATARACT EXTRACTION, BILATERAL Bilateral   . CESAREAN SECTION     x 3  . ORIF TIBIA PLATEAU Right 05/10/2016   Procedure: OPEN REDUCTION INTERNAL FIXATION (ORIF) RIGHT TIBIAL PLATEAU  WITH BONE GRAFT;  Surgeon: Leandrew Koyanagi, MD;  Location: [place];  Service:  Orthopedics;  Laterality: Right;  . US ECHOCARDIOGRAPHY  12/29/2002   EF 60-65%  . VAGINAL HYSTERECTOMY      Allergies as of 06/06/2016   No Known Allergies     Medication List       Accurate as of 06/06/16  2:41 PM. Always use your most recent med list.          acetaminophen 500 MG tablet Commonly known as:  TYLENOL Take 500 mg by mouth 3 (three) times daily. Also take 1 (500 mg) tablet by mouth at bedtime.   allopurinol 300 MG tablet Commonly known as:  ZYLOPRIM Take 300 mg by mouth daily.   amiodarone 100 MG tablet Commonly known as:  PACERONE Take 1 tablet (100 mg total) by mouth every morning.   amLODipine 10 MG tablet Commonly known as:  NORVASC Take 10 mg by mouth at bedtime.   feeding supplement (PRO-STAT SUGAR FREE 64) Liqd Take 30 mLs by mouth 2 (two) times daily. Wound healing   ferrous sulfate 325 (65 FE) MG tablet Take 325 mg by mouth daily with breakfast.   glipiZIDE 5 MG 24 hr tablet Commonly known as:  GLUCOTROL XL Take 5 mg by mouth daily.   GLUCERNA Liqd Take 237 mLs by mouth daily.   hydrALAZINE 50 MG tablet Commonly known as:  APRESOLINE TAKE 1 TABLET BY MOUTH TWICE A DAY   levothyroxine 100  MCG tablet Commonly known as:  SYNTHROID, LEVOTHROID Take 1 tablet (100 mcg total) by mouth daily before breakfast.   methocarbamol 500 MG tablet Commonly known as:  ROBAXIN Take 500 mg by mouth daily as needed for muscle spasms.   multivitamin with minerals Tabs tablet Take 1 tablet by mouth daily.   naproxen sodium 220 MG tablet Commonly known as:  ANAPROX Take 220 mg by mouth every morning.   nitrofurantoin 100 MG capsule Commonly known as:  MACRODANTIN Take 100 mg by mouth every 12 (twelve) hours.   omega-3 acid ethyl esters 1 g capsule Commonly known as:  LOVAZA TAKE 2 CAPSULES BY MOUTH 2 TIMES DAILY.   potassium chloride 10 MEQ tablet Commonly known as:  K-DUR Take 10 mEq by mouth every morning.   pravastatin 20 MG  tablet Commonly known as:  PRAVACHOL TAKE 1 TABLET BY MOUTH AT BEDTIME   PROCRIT IJ Inject 10,000 Units as directed every 28 (twenty-eight) days. As directed every 4 weeks.   sennosides-docusate sodium 8.6-50 MG tablet Commonly known as:  SENOKOT-S Take 1 tablet by mouth 2 (two) times daily.   telmisartan-hydrochlorothiazide 80-25 MG tablet Commonly known as:  MICARDIS HCT Take 1 tablet by mouth daily.       Meds ordered this encounter  Medications  . nitrofurantoin (MACRODANTIN) 100 MG capsule    Sig: Take 100 mg by mouth every 12 (twelve) hours.    Immunization History  Administered Date(s) Administered  . PPD Test 05/12/2016    Social History  Substance Use Topics  . Smoking status: Never Smoker  . Smokeless tobacco: Never Used  . Alcohol use No    Review of Systems  DATA OBTAINED: from patient, nurse GENERAL:  no fevers,+ fatigue, appetite changes SKIN: No itching, rash HEENT: No complaint RESPIRATORY: No cough, wheezing, SOB CARDIAC: No chest pain, palpitations, lower extremity edema  GI: No abdominal pain, No N/V/D or constipation, No heartburn or reflux  GU: No dysuria, frequency or urgency, or incontinence  MUSCULOSKELETAL: No unrelieved joint or bone pain NEUROLOGIC: No headache, dizziness  PSYCHIATRIC: No overt anxiety or sadness  Vitals:   06/06/16 1429  BP: (!) 143/70  Pulse: 75  Resp: 18  Temp: 98.7 F (37.1 C)   Body mass index is 26.66 kg/m. Physical Exam  GENERAL APPEARANCE: sleepy No acute distress  SKIN: No diaphoresis rash HEENT: Unremarkable RESPIRATORY: Breathing is even, unlabored. Lung sounds are clear   CARDIOVASCULAR: Heart RRR no murmurs, rubs or gallops. No peripheral edema  GASTROINTESTINAL: Abdomen is soft, non-tender, not distended w/ normal bowel sounds.  GENITOURINARY: Bladder non tender, not distended  MUSCULOSKELETAL: splint R leg NEUROLOGIC: Cranial nerves 2-12 grossly intact. Moves all extremities PSYCHIATRIC:  Mood and affect appropriate to situation, no behavioral issues  Patient Active Problem List   Diagnosis Date Noted  . CKD (chronic kidney disease), stage III 05/20/2016  . Hypothyroidism 05/20/2016  . Closed fracture of right tibial plateau 05/12/2016  . Anemia in chronic kidney disease 12/16/2015  . Dizzy 07/07/2011  . Dehydration 07/07/2011  . Type II diabetes mellitus ([place]) 07/06/2011  . History of long-term treatment with high-risk medication 08/29/2010  . Abnormal PFTs (pulmonary function tests)   . V-tach ([place])   . HTN (hypertension)   . Hyperlipidemia   . Gout     CMP     Component Value Date/Time   NA 137 05/12/2016 0455   NA 139 05/11/2016 0433   K 4.4 05/12/2016 0455   CL 108 05/12/2016  0455   CO2 22 05/12/2016 0455   GLUCOSE 127 (H) 05/12/2016 0455   BUN 20 05/12/2016 0455   BUN 21 05/11/2016 0433   CREATININE 1.66 (H) 05/12/2016 0455   CREATININE 1.99 (H) 04/04/2016 0910   CALCIUM 8.3 (L) 05/12/2016 0455   PROT 5.2 (L) 05/11/2016 0433   ALBUMIN 2.3 (L) 05/11/2016 0433   AST 19 05/11/2016 0433   ALT 8 (L) 05/11/2016 0433   ALKPHOS 59 05/11/2016 0433   BILITOT 0.5 05/11/2016 0433   GFRNONAA 27 (L) 05/12/2016 0455   GFRAA 31 (L) 05/12/2016 0455    Recent Labs  05/10/16 0336 05/11/16 0433 05/12/16 0455  NA 137  137 139  139 137  K 4.2  4.2 4.0  4.0 4.4  CL 110 111 108  CO2 19* 20* 22  GLUCOSE 113* 82 127*  BUN 25*  25* 21  21* 20  CREATININE 1.98*  2.0* 1.80*  1.8* 1.66*  CALCIUM 8.4* 8.4* 8.3*    Recent Labs  04/04/16 0910 05/10/16 0336 05/11/16 0433  AST 20 19 19   ALT 10 10* 8*  ALKPHOS 94 62 59  BILITOT 0.3 0.8 0.5  PROT 6.7 5.3* 5.2*  ALBUMIN 3.6 2.3* 2.3*    Recent Labs  09/29/15 1841  05/08/16 1141  05/10/16 0336 05/11/16 0433 05/12/16 0455  WBC 20.6*  < > 8.2  8.2  < > 5.6  5.6 5.1  5.1 6.5  NEUTROABS 16.6*  --  5.6  --   --   --   --   HGB 9.8*  < > 9.5*  9.5*  < > 8.8*  8.8* 8.3*  8.3* 8.2*  HCT 29.4*  < >  28.4*  28*  < > 27.2*  27* 26.1*  26* 25.9*  MCV 86.7  < > 87.4  < > 88.3 88.8 90.2  PLT 106*  < > 151  151  < > 139*  139* 122*  122* 121*  < > = values in this interval not displayed.  Recent Labs  11/01/15 0846 04/04/16 0910  CHOL 104* 106  LDLCALC 47 43  TRIG 171* 210*   No results found for: [facility] Lab Results  Component Value Date   TSH 1.45 04/04/2016   Lab Results  Component Value Date   HGBA1C 6.2 (H) 07/07/2011   Lab Results  Component Value Date   CHOL 106 04/04/2016   HDL 21 (L) 04/04/2016   LDLCALC 43 04/04/2016   TRIG 210 (H) 04/04/2016   CHOLHDL 5.0 (H) 04/04/2016    Significant Diagnostic Results in last 30 days:  Ct Knee Right Wo Contrast  Result Date: 05/08/2016 CLINICAL DATA:  Right knee pain EXAM: CT OF THE RIGHT KNEE WITHOUT CONTRAST TECHNIQUE: Multidetector CT imaging of the RIGHT knee was performed according to the standard protocol. Multiplanar CT image reconstructions were also generated. COMPARISON:  None. FINDINGS: Bones/Joint/Cartilage Comminuted and depressed lateral tibial plateau fracture. 10 mm of depression of the articular surface. Area of depression measures 2.5 x 2.3 cm. Severe osteopenia. No other fracture or dislocation. Mild osteoarthritis of the medial femorotibial compartment. Large joint effusion. Small Baker cyst. Ligaments Suboptimally assessed by CT. Muscles and Tendons Muscles are normal. No significant muscle atrophy. Intact quadriceps tendon and patellar tendon. Soft tissues No fluid collection or hematoma. No soft tissue mass. There is peripheral vascular atherosclerotic disease. IMPRESSION: 1. Comminuted and depressed lateral tibial plateau fracture most consistent with Schatzker II classification. Electronically Signed   By: Kathreen Devoid  On: 05/08/2016 10:00   Dg Knee Complete 4 Views Right  Result Date: 05/10/2016 CLINICAL DATA:  Right tibia ORIF EXAM: DG C-ARM 61-120 MIN; RIGHT KNEE - COMPLETE 4+ VIEW COMPARISON:   None. FINDINGS: Four fluoroscopic images obtained during ORIF of the right tibia are provided with the reported fluoroscopy time of 1 minutes 46 seconds. There is a screw plate fixation along the lateral surface of the proximal right tibia without hardware adverse features. IMPRESSION: Intraoperative images during right tibia ORIF. Electronically Signed   By: Ulyses Jarred M.D.   On: 05/10/2016 19:15   Dg Knee Complete 4 Views Right  Result Date: 05/08/2016 CLINICAL DATA:  Post fall stepping through a door at church yesterday. EXAM: RIGHT KNEE - COMPLETE 4+ VIEW COMPARISON:  Right hip radiographs-earlier same day FINDINGS: Osteopenia. There is a potential minimally displaced fracture involving the weight-bearing surface of the lateral aspects of the tibial plateau with associated small knee joint effusion. No definitive evidence of lipohemarthrosis. Mild tricompartmental degenerative change of the knee with joint space loss, subchondral sclerosis osteophytosis. Query minimal amount of chondrocalcinosis within the medial and lateral joint spaces. Vascular calcifications.  No radiopaque foreign body. IMPRESSION: Osteopenia with potential minimally displaced fracture involving the lateral weight-bearing aspect of the tibial plateau. Correlation for tenderness at this location is recommended. Further evaluation with knee CT could be performed as indicated. Electronically Signed   By: Sandi Mariscal M.D.   On: 05/08/2016 08:33   Dg C-arm 1-60 Min  Result Date: 05/10/2016 CLINICAL DATA:  Right tibia ORIF EXAM: DG C-ARM 61-120 MIN; RIGHT KNEE - COMPLETE 4+ VIEW COMPARISON:  None. FINDINGS: Four fluoroscopic images obtained during ORIF of the right tibia are provided with the reported fluoroscopy time of 1 minutes 46 seconds. There is a screw plate fixation along the lateral surface of the proximal right tibia without hardware adverse features. IMPRESSION: Intraoperative images during right tibia ORIF.  Electronically Signed   By: Ulyses Jarred M.D.   On: 05/10/2016 19:15   Dg Hip Unilat W Or Wo Pelvis 2-3 Views Right  Result Date: 05/08/2016 CLINICAL DATA:  Post fall stepping through a door at church yesterday. EXAM: DG HIP (WITH OR WITHOUT PELVIS) 2-3V RIGHT COMPARISON:  None. FINDINGS: No fracture or dislocation. Right hip joint spaces are preserved. No evidence avascular necrosis. Limited visualization of the adjacent pelvis is negative for displaced fracture. Moderate severe multilevel lumbar spine DDD is suspected though incompletely evaluated. Several phleboliths overlie the lower pelvis bilaterally. Suspected bone island overlies the cranial aspect of the left ilium, incompletely evaluated. Vascular calcifications. No radiopaque foreign body. IMPRESSION: 1. No displaced right-sided rib fracture. 2. Moderate severe multilevel lumbar spine DDD is suspected though incompletely evaluated. Electronically Signed   By: Sandi Mariscal M.D.   On: 05/08/2016 08:34   Xr Knee 3 View Right  Result Date: 05/23/2016 Stable fixation of tibial plateau fx   Assessment and Plan  ENTEROCOCCUS UTI/CITROBACER UTI - pt being treated with macrobid 100 mg BID for 7 days which covers both > 100,000 enterococcus gallinarium and > 100,000 citrobacter youngae    Time spent . 25 min;> 50% of time with patient was spent reviewing records, labs, tests and studies, counseling and developing plan of care  Leslie Coleman. Leslie Coil, MD

## 2016-06-07 ENCOUNTER — Encounter: Payer: Self-pay | Admitting: Internal Medicine

## 2016-06-11 ENCOUNTER — Other Ambulatory Visit: Payer: Self-pay | Admitting: Nurse Practitioner

## 2016-06-19 ENCOUNTER — Ambulatory Visit (INDEPENDENT_AMBULATORY_CARE_PROVIDER_SITE_OTHER): Payer: Medicare Other

## 2016-06-19 ENCOUNTER — Encounter (INDEPENDENT_AMBULATORY_CARE_PROVIDER_SITE_OTHER): Payer: Self-pay | Admitting: Orthopaedic Surgery

## 2016-06-19 ENCOUNTER — Ambulatory Visit (INDEPENDENT_AMBULATORY_CARE_PROVIDER_SITE_OTHER): Payer: Medicare Other | Admitting: Orthopaedic Surgery

## 2016-06-19 DIAGNOSIS — S82141D Displaced bicondylar fracture of right tibia, subsequent encounter for closed fracture with routine healing: Secondary | ICD-10-CM | POA: Diagnosis not present

## 2016-06-19 NOTE — Progress Notes (Signed)
Leslie Coleman is 6 weeks status post operative fixation of right tibial plateau fracture. She is doing well. She denies any pain. She has been nonweightbearing.: Exam is benign. No signs of infection. X-ray show stable fixation of depressed lateral tibial plateau fracture with fibular allograft. No complications. Begin weightbearing as tolerated and wean Bledsoe brace with physical therapy. I feel that the patient is not safe to go home at this point given her deconditioning. I think she needs at least 2-3 weeks of physical therapy for gait training, balance, strengthening, mobilization order to safely go home. I'll see her back in 6 weeks with repeat 2 view x-rays of the right knee

## 2016-06-20 ENCOUNTER — Ambulatory Visit (INDEPENDENT_AMBULATORY_CARE_PROVIDER_SITE_OTHER): Payer: Medicare Other | Admitting: Orthopaedic Surgery

## 2016-06-20 ENCOUNTER — Encounter: Payer: Self-pay | Admitting: Internal Medicine

## 2016-06-20 NOTE — Progress Notes (Signed)
Opened in error; Disregard.

## 2016-06-21 ENCOUNTER — Non-Acute Institutional Stay (SKILLED_NURSING_FACILITY): Payer: Medicare Other | Admitting: Internal Medicine

## 2016-06-21 DIAGNOSIS — Z20828 Contact with and (suspected) exposure to other viral communicable diseases: Secondary | ICD-10-CM

## 2016-06-22 ENCOUNTER — Encounter: Payer: Self-pay | Admitting: Internal Medicine

## 2016-06-22 ENCOUNTER — Non-Acute Institutional Stay (SKILLED_NURSING_FACILITY): Payer: Medicare Other | Admitting: Internal Medicine

## 2016-06-22 DIAGNOSIS — E1122 Type 2 diabetes mellitus with diabetic chronic kidney disease: Secondary | ICD-10-CM

## 2016-06-22 DIAGNOSIS — N183 Chronic kidney disease, stage 3 unspecified: Secondary | ICD-10-CM

## 2016-06-22 DIAGNOSIS — D631 Anemia in chronic kidney disease: Secondary | ICD-10-CM | POA: Diagnosis not present

## 2016-06-22 DIAGNOSIS — Z8679 Personal history of other diseases of the circulatory system: Secondary | ICD-10-CM

## 2016-06-22 DIAGNOSIS — E7800 Pure hypercholesterolemia, unspecified: Secondary | ICD-10-CM | POA: Diagnosis not present

## 2016-06-22 DIAGNOSIS — S82141D Displaced bicondylar fracture of right tibia, subsequent encounter for closed fracture with routine healing: Secondary | ICD-10-CM | POA: Diagnosis not present

## 2016-06-22 DIAGNOSIS — I10 Essential (primary) hypertension: Secondary | ICD-10-CM

## 2016-06-22 DIAGNOSIS — E034 Atrophy of thyroid (acquired): Secondary | ICD-10-CM

## 2016-06-22 NOTE — Progress Notes (Signed)
Location:  [place] Room Number: 103P Place of Service:  SNF (31) Leslie Coleman. Sheppard Coil, MD  PCP: [name], MD Patient Care Team: [name], [name], MD as Consulting Physician (Orthopedic Surgery)  Extended Emergency Contact Information Primary Emergency Contact: Derise,Ervin Address: [place], [place] Phone: [phone] Relation: Spouse Secondary Emergency Contact: Manning,Cindy  [place] Mobile Phone: [phone] Relation: Daughter  No Known Allergies  Chief Complaint  Patient presents with  . Discharge Note    Discharged from SNF    HPI:  83 y.o. female with hypertension, hypothyroidism, diabetes mellitus and anemia presented to the ED [place] She tripped on the rug and fell onto her right knee. At baseline see ambulates with the walker and cane. After the fall she was unable to bear weight on her right leg. She denied any loss of consciousness or sustaining any other injury. In the ED workup done showed comminuted and depressed lateral tibial plateau fracture consistent withSchatzker II classification. Pt as [facility] to [facility] from [facility]22 where pt underwent surgery on her knee with post-op blood loss not requiring a transfusion. Pt was admitted to SNF for OT/PT and is now ready to be d/c to home.    Past Medical History:  Diagnosis Date  . Abnormal PFTs (pulmonary function tests) 2008   No obstruction noted. Has been followed clinically.  . Anemia    treated with Procrit  . Fall from slip, trip, or stumble 05/07/2016   fell at [place] resulting in right tibial plateau fracture  . Gout   . High risk medication use    on amiodarone.  Marland Kitchen HTN (hypertension)    Last echo in 2004; EF normal  . Hyperlipidemia   . Hypothyroidism    on replacement  . Obesity   . Type II  diabetes mellitus ([place])   . V-tach [facility])    remote VT, treated initially with procainamide, on amiodarone since 2008    Past Surgical History:  Procedure Laterality Date  . CARDIOVASCULAR STRESS TEST  07/07/2003   EF 70%. NO EVIDENCE OF ISCHEMIA  . CARDIOVERSION  1978  . CATARACT EXTRACTION, BILATERAL Bilateral   . CESAREAN SECTION     x 3  . ORIF TIBIA PLATEAU Right 05/10/2016   Procedure: OPEN REDUCTION INTERNAL FIXATION (ORIF) RIGHT TIBIAL PLATEAU  WITH BONE GRAFT;  Surgeon: Leandrew Koyanagi, MD;  Location: [place];  Service: Orthopedics;  Laterality: Right;  . US ECHOCARDIOGRAPHY  12/29/2002   EF 60-65%  . VAGINAL HYSTERECTOMY       reports that she has never smoked. She has never used smokeless tobacco. She reports that she does not drink alcohol or use drugs. Social History   Social History  . Marital status: Married    Spouse name: N/A  . Number of children: N/A  . Years of education: N/A   Occupational History  . Not on file.   Social History Main Topics  . Smoking status: Never Smoker  . Smokeless tobacco: Never Used  . Alcohol use No  . Drug use: No  . Sexual activity: Not Currently   Other Topics Concern  . Not on file   Social History Narrative   Lives with spouse in [place] Maintenance Due  Topic Date Due  .  FOOT EXAM  05/26/1940  . OPHTHALMOLOGY EXAM  05/26/1940  . DEXA SCAN  05/27/1995  . PNA vac Low Risk Adult (1 of 2 - PCV13) 05/27/1995  . HEMOGLOBIN A1C  01/04/2012  . INFLUENZA VACCINE  12/21/2015    Medications: Allergies as of 06/22/2016   No Known Allergies     Medication List       Accurate as of 06/22/16 11:59 PM. Always use your most recent med list.          acetaminophen 500 MG tablet Commonly known as:  TYLENOL Take 500 mg by mouth 3 (three) times daily. Also take 1 (500 mg) tablet by mouth at bedtime.   allopurinol 300 MG tablet Commonly known as:  ZYLOPRIM Take 300 mg by mouth daily.   amiodarone 100  MG tablet Commonly known as:  PACERONE Take 1 tablet (100 mg total) by mouth every morning.   amLODipine 10 MG tablet Commonly known as:  NORVASC Take 10 mg by mouth at bedtime.   feeding supplement (PRO-STAT SUGAR FREE 64) Liqd Take 30 mLs by mouth 2 (two) times daily. Wound healing   ferrous sulfate 325 (65 FE) MG tablet Take 325 mg by mouth daily with breakfast.   glipiZIDE 5 MG 24 hr tablet Commonly known as:  GLUCOTROL XL Take 5 mg by mouth daily.   GLUCERNA Liqd Take 237 mLs by mouth daily.   hydrALAZINE 50 MG tablet Commonly known as:  APRESOLINE TAKE 1 TABLET BY MOUTH TWICE A DAY   levothyroxine 100 MCG tablet Commonly known as:  SYNTHROID, LEVOTHROID Take 1 tablet (100 mcg total) by mouth daily before breakfast.   methocarbamol 500 MG tablet Commonly known as:  ROBAXIN Take 500 mg by mouth daily as needed for muscle spasms.   multivitamin with minerals Tabs tablet Take 1 tablet by mouth daily.   naproxen sodium 220 MG tablet Commonly known as:  ANAPROX Take 220 mg by mouth every morning.   omega-3 acid ethyl esters 1 g capsule Commonly known as:  LOVAZA TAKE 2 CAPSULES BY MOUTH 2 TIMES DAILY.   potassium chloride 10 MEQ tablet Commonly known as:  K-DUR Take 10 mEq by mouth every morning.   pravastatin 20 MG tablet Commonly known as:  PRAVACHOL TAKE 1 TABLET BY MOUTH AT BEDTIME   PROCRIT IJ Inject 10,000 Units as directed every 28 (twenty-eight) days. As directed every 4 weeks.   sennosides-docusate sodium 8.6-50 MG tablet Commonly known as:  SENOKOT-S Take 1 tablet by mouth 2 (two) times daily.   telmisartan-hydrochlorothiazide 80-25 MG tablet Commonly known as:  MICARDIS HCT Take 1 tablet by mouth daily.        Vitals:   06/22/16 1034  BP: 113/62  Pulse: 72  Resp: 18  Temp: 97.8 F (36.6 C)  Weight: 158 lb 3.2 oz (71.8 kg)  Height: 5\' 5"  (1.651 m)   Body mass index is 26.33 kg/m.  Physical Exam  GENERAL APPEARANCE: Alert,  conversant. No acute distress.  HEENT: Unremarkable. RESPIRATORY: Breathing is even, unlabored. Lung sounds are clear   CARDIOVASCULAR: Heart RRR no murmurs, rubs or gallops. No peripheral edema.  GASTROINTESTINAL: Abdomen is soft, non-tender, not distended w/ normal bowel sounds.  NEUROLOGIC: Cranial nerves 2-12 grossly intact. Moves all extremities   Labs reviewed: Basic Metabolic Panel:  Recent Labs  05/10/16 0336 05/11/16 0433 05/12/16 0455 05/17/16  NA 137  137 139  139 137 136*  K 4.2  4.2 4.0  4.0 4.4 5.3  CL  110 111 108  --   CO2 19* 20* 22  --   GLUCOSE 113* 82 127*  --   BUN 25*  25* 21  21* 20 41*  CREATININE 1.98*  2.0* 1.80*  1.8* 1.66* 1.9*  CALCIUM 8.4* 8.4* 8.3*  --    No results found for: [facility] Liver Function Tests:  Recent Labs  04/04/16 0910 05/10/16 0336 05/11/16 0433  AST 20 19 19   ALT 10 10* 8*  ALKPHOS 94 62 59  BILITOT 0.3 0.8 0.5  PROT 6.7 5.3* 5.2*  ALBUMIN 3.6 2.3* 2.3*   No results for input(s): LIPASE, AMYLASE in the last 8760 hours. No results for input(s): AMMONIA in the last 8760 hours. CBC:  Recent Labs  09/29/15 1841  05/08/16 1141  05/10/16 0336 05/11/16 0433 05/12/16 0455 05/17/16  WBC 20.6*  < > 8.2  8.2  < > 5.6  5.6 5.1  5.1 6.5 5.5  NEUTROABS 16.6*  --  5.6  --   --   --   --   --   HGB 9.8*  < > 9.5*  9.5*  < > 8.8*  8.8* 8.3*  8.3* 8.2* 8.6*  HCT 29.4*  < > 28.4*  28*  < > 27.2*  27* 26.1*  26* 25.9* 28*  MCV 86.7  < > 87.4  < > 88.3 88.8 90.2  --   PLT 106*  < > 151  151  < > 139*  139* 122*  122* 121* 204  < > = values in this interval not displayed. Lipid  Recent Labs  11/01/15 0846 04/04/16 0910  CHOL 104* 106  HDL 23* 21*  LDLCALC 47 43  TRIG 171* 210*   Cardiac Enzymes: No results for input(s): CKTOTAL, CKMB, CKMBINDEX, TROPONINI in the last 8760 hours. BNP: No results for input(s): BNP in the last 8760 hours. CBG:  Recent Labs  05/11/16 2101 05/12/16 0731  05/12/16 1125  GLUCAP 184* 135* 137*    Procedures and Imaging Studies During Stay: Xr Knee 3 View Right  Result Date: 06/19/2016 Stable tibial plateau fracture. No complications.   Assessment/Plan:   Closed fracture of right tibial plateau with routine healing, subsequent encounter  Type 2 diabetes mellitus with chronic kidney disease, without long-term current use of insulin, unspecified CKD stage (HCC)  CKD (chronic kidney disease), stage III  Essential hypertension  Hypothyroidism due to acquired atrophy of thyroid  History of ventricular tachycardia  Pure hypercholesterolemia  Anemia in stage 3 chronic kidney disease   Patient is being discharged with the following home health services:  OT/PT/Nursing  Patient is being discharged with the following durable medical equipment:  Standard WC  Patient has been advised to f/u with their PCP in 1-2 weeks to bring them up to date on their rehab stay.  Social services at facility was responsible for arranging this appointment.  Pt was provided with a 30 day supply of prescriptions for medications and refills must be obtained from their PCP.  For controlled substances, a more limited supply may be provided adequate until PCP appointment only.    Time spent > 30 min Leslie Madonna D. Sheppard Coil, MD

## 2016-06-23 ENCOUNTER — Encounter: Payer: Self-pay | Admitting: Internal Medicine

## 2016-06-29 ENCOUNTER — Other Ambulatory Visit: Payer: Self-pay | Admitting: Nurse Practitioner

## 2016-06-29 ENCOUNTER — Telehealth (INDEPENDENT_AMBULATORY_CARE_PROVIDER_SITE_OTHER): Payer: Self-pay | Admitting: Orthopaedic Surgery

## 2016-06-29 ENCOUNTER — Telehealth (INDEPENDENT_AMBULATORY_CARE_PROVIDER_SITE_OTHER): Payer: Self-pay | Admitting: *Deleted

## 2016-06-29 NOTE — Telephone Encounter (Signed)
Need verbal order for physical therapy   2x per wk for 4wks for the following:   strengthening Safe transfer Gait balance training   Please include any specific protocol

## 2016-06-29 NOTE — Telephone Encounter (Signed)
See message below. If so, which brace?

## 2016-06-29 NOTE — Telephone Encounter (Signed)
yes

## 2016-06-29 NOTE — Telephone Encounter (Signed)
Hinged knee brace?

## 2016-06-29 NOTE — Telephone Encounter (Signed)
Patient's daughter called in this afternoon in regards to wanting to know if her mother could get a brace for her right knee? She would appreciate a call back regarding this please at [phone]. Thank you

## 2016-06-29 NOTE — Telephone Encounter (Signed)
See message below °

## 2016-06-30 ENCOUNTER — Telehealth (INDEPENDENT_AMBULATORY_CARE_PROVIDER_SITE_OTHER): Payer: Self-pay | Admitting: Orthopaedic Surgery

## 2016-06-30 DIAGNOSIS — S82141D Displaced bicondylar fracture of right tibia, subsequent encounter for closed fracture with routine healing: Secondary | ICD-10-CM | POA: Diagnosis not present

## 2016-06-30 DIAGNOSIS — M109 Gout, unspecified: Secondary | ICD-10-CM | POA: Diagnosis not present

## 2016-06-30 DIAGNOSIS — E1122 Type 2 diabetes mellitus with diabetic chronic kidney disease: Secondary | ICD-10-CM | POA: Diagnosis not present

## 2016-06-30 DIAGNOSIS — D631 Anemia in chronic kidney disease: Secondary | ICD-10-CM | POA: Diagnosis not present

## 2016-06-30 DIAGNOSIS — I129 Hypertensive chronic kidney disease with stage 1 through stage 4 chronic kidney disease, or unspecified chronic kidney disease: Secondary | ICD-10-CM | POA: Diagnosis not present

## 2016-06-30 DIAGNOSIS — Z993 Dependence on wheelchair: Secondary | ICD-10-CM | POA: Diagnosis not present

## 2016-06-30 DIAGNOSIS — I4891 Unspecified atrial fibrillation: Secondary | ICD-10-CM | POA: Diagnosis not present

## 2016-06-30 DIAGNOSIS — W010XXD Fall on same level from slipping, tripping and stumbling without subsequent striking against object, subsequent encounter: Secondary | ICD-10-CM | POA: Diagnosis not present

## 2016-06-30 DIAGNOSIS — I471 Supraventricular tachycardia: Secondary | ICD-10-CM | POA: Diagnosis not present

## 2016-06-30 DIAGNOSIS — Z7984 Long term (current) use of oral hypoglycemic drugs: Secondary | ICD-10-CM | POA: Diagnosis not present

## 2016-06-30 DIAGNOSIS — N184 Chronic kidney disease, stage 4 (severe): Secondary | ICD-10-CM | POA: Diagnosis not present

## 2016-06-30 NOTE — Telephone Encounter (Signed)
Leslie Coleman will be picking this beace up at 12:30

## 2016-06-30 NOTE — Telephone Encounter (Signed)
Patient's son (tommy) called advised his mother is in a lot of pain and need a Rx for pain medicine. The number to contact Patient is [phone]

## 2016-06-30 NOTE — Telephone Encounter (Signed)
LMOM Okay per Dr [name]

## 2016-06-30 NOTE — Telephone Encounter (Signed)
Called Leslie Coleman to let her know she can come by to pick up knee brace. I advised her I am at the [facility] location this morning and she said she will call her brother to see what time exactly he will be coming to pick up brace. She will call us back

## 2016-07-03 ENCOUNTER — Telehealth (INDEPENDENT_AMBULATORY_CARE_PROVIDER_SITE_OTHER): Payer: Self-pay | Admitting: Orthopaedic Surgery

## 2016-07-03 NOTE — Telephone Encounter (Signed)
Leslie Coleman with [facility] called advised she saw patient on Friday and need verbal orders hor home health (PT) for 5 weeks and (OT eval)  The number to contact her is [phone]

## 2016-07-04 ENCOUNTER — Telehealth (INDEPENDENT_AMBULATORY_CARE_PROVIDER_SITE_OTHER): Payer: Self-pay | Admitting: Orthopaedic Surgery

## 2016-07-04 MED ORDER — HYDROCODONE-ACETAMINOPHEN 5-325 MG PO TABS: 1.0000 | ORAL_TABLET | Freq: Every day | ORAL | 0 refills | Status: DC

## 2016-07-04 NOTE — Telephone Encounter (Signed)
Please advise 

## 2016-07-04 NOTE — Telephone Encounter (Signed)
Called Pt to let her know Rx is ready for pick up at the front desk.

## 2016-07-04 NOTE — Telephone Encounter (Signed)
[place] #[id number]

## 2016-07-04 NOTE — Telephone Encounter (Signed)
yes

## 2016-07-04 NOTE — Telephone Encounter (Signed)
Leslie Coleman (PT) with [facility] called needing verbal orders for (PT) Home Health for 5 weeks and (OT eval) The number to contact her is [phone]

## 2016-07-04 NOTE — Addendum Note (Signed)
Addended by: Precious Bard on: 07/04/2016 02:59 PM   Modules accepted: Orders

## 2016-07-04 NOTE — Telephone Encounter (Signed)
Called Ann to advise no answer [facility]

## 2016-07-06 DIAGNOSIS — N184 Chronic kidney disease, stage 4 (severe): Secondary | ICD-10-CM | POA: Diagnosis not present

## 2016-07-06 DIAGNOSIS — E1122 Type 2 diabetes mellitus with diabetic chronic kidney disease: Secondary | ICD-10-CM | POA: Diagnosis not present

## 2016-07-06 DIAGNOSIS — M109 Gout, unspecified: Secondary | ICD-10-CM | POA: Diagnosis not present

## 2016-07-06 DIAGNOSIS — I129 Hypertensive chronic kidney disease with stage 1 through stage 4 chronic kidney disease, or unspecified chronic kidney disease: Secondary | ICD-10-CM | POA: Diagnosis not present

## 2016-07-06 DIAGNOSIS — D631 Anemia in chronic kidney disease: Secondary | ICD-10-CM | POA: Diagnosis not present

## 2016-07-06 DIAGNOSIS — S82141D Displaced bicondylar fracture of right tibia, subsequent encounter for closed fracture with routine healing: Secondary | ICD-10-CM | POA: Diagnosis not present

## 2016-07-07 DIAGNOSIS — M109 Gout, unspecified: Secondary | ICD-10-CM | POA: Diagnosis not present

## 2016-07-07 DIAGNOSIS — D631 Anemia in chronic kidney disease: Secondary | ICD-10-CM | POA: Diagnosis not present

## 2016-07-07 DIAGNOSIS — S82141D Displaced bicondylar fracture of right tibia, subsequent encounter for closed fracture with routine healing: Secondary | ICD-10-CM | POA: Diagnosis not present

## 2016-07-07 DIAGNOSIS — I129 Hypertensive chronic kidney disease with stage 1 through stage 4 chronic kidney disease, or unspecified chronic kidney disease: Secondary | ICD-10-CM | POA: Diagnosis not present

## 2016-07-07 DIAGNOSIS — N184 Chronic kidney disease, stage 4 (severe): Secondary | ICD-10-CM | POA: Diagnosis not present

## 2016-07-07 DIAGNOSIS — E1122 Type 2 diabetes mellitus with diabetic chronic kidney disease: Secondary | ICD-10-CM | POA: Diagnosis not present

## 2016-07-10 DIAGNOSIS — S82141D Displaced bicondylar fracture of right tibia, subsequent encounter for closed fracture with routine healing: Secondary | ICD-10-CM | POA: Diagnosis not present

## 2016-07-10 DIAGNOSIS — D631 Anemia in chronic kidney disease: Secondary | ICD-10-CM | POA: Diagnosis not present

## 2016-07-10 DIAGNOSIS — E1122 Type 2 diabetes mellitus with diabetic chronic kidney disease: Secondary | ICD-10-CM | POA: Diagnosis not present

## 2016-07-10 DIAGNOSIS — N184 Chronic kidney disease, stage 4 (severe): Secondary | ICD-10-CM | POA: Diagnosis not present

## 2016-07-10 DIAGNOSIS — I129 Hypertensive chronic kidney disease with stage 1 through stage 4 chronic kidney disease, or unspecified chronic kidney disease: Secondary | ICD-10-CM | POA: Diagnosis not present

## 2016-07-10 DIAGNOSIS — M109 Gout, unspecified: Secondary | ICD-10-CM | POA: Diagnosis not present

## 2016-07-12 DIAGNOSIS — D631 Anemia in chronic kidney disease: Secondary | ICD-10-CM | POA: Diagnosis not present

## 2016-07-12 DIAGNOSIS — I129 Hypertensive chronic kidney disease with stage 1 through stage 4 chronic kidney disease, or unspecified chronic kidney disease: Secondary | ICD-10-CM | POA: Diagnosis not present

## 2016-07-12 DIAGNOSIS — S82141D Displaced bicondylar fracture of right tibia, subsequent encounter for closed fracture with routine healing: Secondary | ICD-10-CM | POA: Diagnosis not present

## 2016-07-12 DIAGNOSIS — E1122 Type 2 diabetes mellitus with diabetic chronic kidney disease: Secondary | ICD-10-CM | POA: Diagnosis not present

## 2016-07-12 DIAGNOSIS — N184 Chronic kidney disease, stage 4 (severe): Secondary | ICD-10-CM | POA: Diagnosis not present

## 2016-07-12 DIAGNOSIS — M109 Gout, unspecified: Secondary | ICD-10-CM | POA: Diagnosis not present

## 2016-07-13 DIAGNOSIS — N184 Chronic kidney disease, stage 4 (severe): Secondary | ICD-10-CM | POA: Diagnosis not present

## 2016-07-13 DIAGNOSIS — I129 Hypertensive chronic kidney disease with stage 1 through stage 4 chronic kidney disease, or unspecified chronic kidney disease: Secondary | ICD-10-CM | POA: Diagnosis not present

## 2016-07-13 DIAGNOSIS — E1122 Type 2 diabetes mellitus with diabetic chronic kidney disease: Secondary | ICD-10-CM | POA: Diagnosis not present

## 2016-07-13 DIAGNOSIS — D631 Anemia in chronic kidney disease: Secondary | ICD-10-CM | POA: Diagnosis not present

## 2016-07-13 DIAGNOSIS — M109 Gout, unspecified: Secondary | ICD-10-CM | POA: Diagnosis not present

## 2016-07-13 DIAGNOSIS — S82141D Displaced bicondylar fracture of right tibia, subsequent encounter for closed fracture with routine healing: Secondary | ICD-10-CM | POA: Diagnosis not present

## 2016-07-14 ENCOUNTER — Telehealth: Payer: Self-pay | Admitting: *Deleted

## 2016-07-14 DIAGNOSIS — I129 Hypertensive chronic kidney disease with stage 1 through stage 4 chronic kidney disease, or unspecified chronic kidney disease: Secondary | ICD-10-CM | POA: Diagnosis not present

## 2016-07-14 DIAGNOSIS — E1122 Type 2 diabetes mellitus with diabetic chronic kidney disease: Secondary | ICD-10-CM | POA: Diagnosis not present

## 2016-07-14 DIAGNOSIS — S82141D Displaced bicondylar fracture of right tibia, subsequent encounter for closed fracture with routine healing: Secondary | ICD-10-CM | POA: Diagnosis not present

## 2016-07-14 DIAGNOSIS — N184 Chronic kidney disease, stage 4 (severe): Secondary | ICD-10-CM | POA: Diagnosis not present

## 2016-07-14 DIAGNOSIS — M109 Gout, unspecified: Secondary | ICD-10-CM | POA: Diagnosis not present

## 2016-07-14 DIAGNOSIS — D631 Anemia in chronic kidney disease: Secondary | ICD-10-CM | POA: Diagnosis not present

## 2016-07-14 NOTE — Telephone Encounter (Signed)
Pt's daughter walked in office today to check pt's medication.  Pt has busted knee, pt fell at church, was not using walker instead using cane. Has been in rehab.  Is now at home and pt's daughter wanted to verify medication's before giving to pt.  Tired to verify according to pharmacy list.  Not correct.  Gave pt's daughter office list what was on file and will go home and compare list.  Will call if any changes. Will send to [place] to [place].

## 2016-07-15 NOTE — Telephone Encounter (Signed)
Have asked for med list from the facility = needs clarification of her medicines.

## 2016-07-17 ENCOUNTER — Other Ambulatory Visit: Payer: Self-pay | Admitting: *Deleted

## 2016-07-17 ENCOUNTER — Telehealth: Payer: Self-pay | Admitting: *Deleted

## 2016-07-17 DIAGNOSIS — D631 Anemia in chronic kidney disease: Secondary | ICD-10-CM | POA: Diagnosis not present

## 2016-07-17 DIAGNOSIS — I129 Hypertensive chronic kidney disease with stage 1 through stage 4 chronic kidney disease, or unspecified chronic kidney disease: Secondary | ICD-10-CM | POA: Diagnosis not present

## 2016-07-17 DIAGNOSIS — N184 Chronic kidney disease, stage 4 (severe): Secondary | ICD-10-CM | POA: Diagnosis not present

## 2016-07-17 DIAGNOSIS — S82141D Displaced bicondylar fracture of right tibia, subsequent encounter for closed fracture with routine healing: Secondary | ICD-10-CM | POA: Diagnosis not present

## 2016-07-17 DIAGNOSIS — E1122 Type 2 diabetes mellitus with diabetic chronic kidney disease: Secondary | ICD-10-CM | POA: Diagnosis not present

## 2016-07-17 DIAGNOSIS — M109 Gout, unspecified: Secondary | ICD-10-CM | POA: Diagnosis not present

## 2016-07-17 NOTE — Telephone Encounter (Signed)
Pt's daughter per DPR is stating pt's pharmacy changed to pill packet. Need to call pt's daughter to find where pt was in nursing home.

## 2016-07-18 DIAGNOSIS — D631 Anemia in chronic kidney disease: Secondary | ICD-10-CM | POA: Diagnosis not present

## 2016-07-18 DIAGNOSIS — I129 Hypertensive chronic kidney disease with stage 1 through stage 4 chronic kidney disease, or unspecified chronic kidney disease: Secondary | ICD-10-CM | POA: Diagnosis not present

## 2016-07-18 DIAGNOSIS — S82141D Displaced bicondylar fracture of right tibia, subsequent encounter for closed fracture with routine healing: Secondary | ICD-10-CM | POA: Diagnosis not present

## 2016-07-18 DIAGNOSIS — N184 Chronic kidney disease, stage 4 (severe): Secondary | ICD-10-CM | POA: Diagnosis not present

## 2016-07-18 DIAGNOSIS — M109 Gout, unspecified: Secondary | ICD-10-CM | POA: Diagnosis not present

## 2016-07-18 DIAGNOSIS — E1122 Type 2 diabetes mellitus with diabetic chronic kidney disease: Secondary | ICD-10-CM | POA: Diagnosis not present

## 2016-07-18 NOTE — Telephone Encounter (Signed)
lvm that pt needs to come in next week for ov with Kathrene Alu and bring all medications from nursing facility and from home. Left number to call office to schedule appointment.

## 2016-07-18 NOTE — Telephone Encounter (Signed)
Talked with patient's daughter who confirmed patient was at [facility], [facility] and [facility]. She went over our medication list last night with her mother. Her mother is unable to find the original D'c list but says our list was correct to her knowledge.  The daughter will be calling the facility tonight to ask what they had as her discharge medications and will be calling Danielle back in the morning with this list.  Message fowarded to [employer]

## 2016-07-19 DIAGNOSIS — I129 Hypertensive chronic kidney disease with stage 1 through stage 4 chronic kidney disease, or unspecified chronic kidney disease: Secondary | ICD-10-CM | POA: Diagnosis not present

## 2016-07-19 DIAGNOSIS — S82141D Displaced bicondylar fracture of right tibia, subsequent encounter for closed fracture with routine healing: Secondary | ICD-10-CM | POA: Diagnosis not present

## 2016-07-19 DIAGNOSIS — N184 Chronic kidney disease, stage 4 (severe): Secondary | ICD-10-CM | POA: Diagnosis not present

## 2016-07-19 DIAGNOSIS — D631 Anemia in chronic kidney disease: Secondary | ICD-10-CM | POA: Diagnosis not present

## 2016-07-19 DIAGNOSIS — M109 Gout, unspecified: Secondary | ICD-10-CM | POA: Diagnosis not present

## 2016-07-19 DIAGNOSIS — E1122 Type 2 diabetes mellitus with diabetic chronic kidney disease: Secondary | ICD-10-CM | POA: Diagnosis not present

## 2016-07-20 ENCOUNTER — Other Ambulatory Visit: Payer: Self-pay | Admitting: Nurse Practitioner

## 2016-07-20 DIAGNOSIS — E1122 Type 2 diabetes mellitus with diabetic chronic kidney disease: Secondary | ICD-10-CM | POA: Diagnosis not present

## 2016-07-20 DIAGNOSIS — N184 Chronic kidney disease, stage 4 (severe): Secondary | ICD-10-CM | POA: Diagnosis not present

## 2016-07-20 DIAGNOSIS — M109 Gout, unspecified: Secondary | ICD-10-CM | POA: Diagnosis not present

## 2016-07-20 DIAGNOSIS — D631 Anemia in chronic kidney disease: Secondary | ICD-10-CM | POA: Diagnosis not present

## 2016-07-20 DIAGNOSIS — S82141D Displaced bicondylar fracture of right tibia, subsequent encounter for closed fracture with routine healing: Secondary | ICD-10-CM | POA: Diagnosis not present

## 2016-07-20 DIAGNOSIS — I129 Hypertensive chronic kidney disease with stage 1 through stage 4 chronic kidney disease, or unspecified chronic kidney disease: Secondary | ICD-10-CM | POA: Diagnosis not present

## 2016-07-20 MED ORDER — AMIODARONE HCL 100 MG PO TABS: 100.0000 mg | ORAL_TABLET | Freq: Every morning | ORAL | 2 refills | Status: DC

## 2016-07-24 DIAGNOSIS — N184 Chronic kidney disease, stage 4 (severe): Secondary | ICD-10-CM | POA: Diagnosis not present

## 2016-07-24 DIAGNOSIS — S82141D Displaced bicondylar fracture of right tibia, subsequent encounter for closed fracture with routine healing: Secondary | ICD-10-CM | POA: Diagnosis not present

## 2016-07-24 DIAGNOSIS — E1122 Type 2 diabetes mellitus with diabetic chronic kidney disease: Secondary | ICD-10-CM | POA: Diagnosis not present

## 2016-07-24 DIAGNOSIS — I129 Hypertensive chronic kidney disease with stage 1 through stage 4 chronic kidney disease, or unspecified chronic kidney disease: Secondary | ICD-10-CM | POA: Diagnosis not present

## 2016-07-24 DIAGNOSIS — D631 Anemia in chronic kidney disease: Secondary | ICD-10-CM | POA: Diagnosis not present

## 2016-07-24 DIAGNOSIS — M109 Gout, unspecified: Secondary | ICD-10-CM | POA: Diagnosis not present

## 2016-07-24 NOTE — Progress Notes (Addendum)
CARDIOLOGY OFFICE NOTE  Date:  07/25/2016    Leslie Coleman Date of Birth: 08-Jan-1929 Medical Record #[id number]  PCP:  [name], MD  Cardiologist:  Servando [facility]   Chief Complaint  Patient presents with  . Medication Problem    Work in visit - seen for Dr. Rayann Heman    History of Present Illness: Leslie Coleman is a 83 y.o. female who presents today for a work in visit. Seen for Dr. Rayann Heman - former patient of Dr. Susa Simmonds.   She has a remote history of VT - treated initially with procainamide but switched to amiodarone back in 2008 due to unavailability of the medicine. Does have chronically abnormal PFTs. Other issues include HTN, gout, obesity, DM and anemia. On chronic Procrit.   I saw her back in September &October of 2015 - seemed to be getting feeble. Had a cold. Chronic DOE noted. More swelling. Weight was up. I added Lasix for a few days and she ended up taking just prn. Cut her atenolol back due to bradycardia and updated her labs and echo. TSH was elevated and we increased her thyroid medicine. She saw Dr. Rayann Heman in June of 2016 and was felt to be stable.   She was in the ER back in May of 2017 - was dizzy. Looks like she was treated with IVF and antibiotics. Her HR was 42 by EKG - this was not addressed upon my review. I then saw her in June for her regular check - she remained bradycardic - she was asymptomatic - I stopped her Atenolol. Last visit with me was back in November and she was doing well. Cardiac status seemed ok. She had lost a significant amount of weight - supposedly intentional.   Several phone calls last month to discuss medicines. Had been at some rehab facility - medicines very difficult to clarify - she was asked to come in and bring all her bottles and instructions with her so we could straighten out.   Comes in today. Here with her daughter [name] today. She has not brought her prior instructions from the rehab facility. Has her own  bottles. Bottles from the facility and packages from "pill pack" as well. VERY CONFUSED about her medicines.   She fell in December at church and broke her right leg - was at [facility] and had open reduction and internal fixation of the right tibial fracture. She was then sent to [facility] - for a month - home for one month. She is short of breath wit activity - just now getting back to regular activities but slow. Now using a walker. No more falls. She does not know any of her medicines. No chest pain. Not dizzy. Her prescriptions now go thru [place] [phone]). Husband was called by daughter during this visit to say what she is taking because they did not bring any of their bottles as instructed. She is on Lasix, back on potassium. Not on MicardisHCT but daughter feels like this just needed refilling.   Multiple papers with medicines noted - none of which match or correlate.    Past Medical History:  Diagnosis Date  . Abnormal PFTs (pulmonary function tests) 2008   No obstruction noted. Has been followed clinically.  . Anemia    treated with Procrit  . Fall from slip, trip, or stumble 05/07/2016   fell at church resulting in right tibial plateau fracture  . Gout   . High risk medication use  on amiodarone.  Marland Kitchen HTN (hypertension)    Last echo in 2004; EF normal  . Hyperlipidemia   . Hypothyroidism    on replacement  . Obesity   . Type II diabetes mellitus ([place])   . V-tach [facility])    remote VT, treated initially with procainamide, on amiodarone since 2008    Past Surgical History:  Procedure Laterality Date  . CARDIOVASCULAR STRESS TEST  07/07/2003   EF 70%. NO EVIDENCE OF ISCHEMIA  . CARDIOVERSION  1978  . CATARACT EXTRACTION, BILATERAL Bilateral   . CESAREAN SECTION     x 3  . ORIF TIBIA PLATEAU Right 05/10/2016   Procedure: OPEN REDUCTION INTERNAL FIXATION (ORIF) RIGHT TIBIAL PLATEAU  WITH BONE GRAFT;  Surgeon: Leandrew Koyanagi, MD;  Location: [place];  Service: Orthopedics;   Laterality: Right;  . US ECHOCARDIOGRAPHY  12/29/2002   EF 60-65%  . VAGINAL HYSTERECTOMY       Medications: Current Outpatient Prescriptions  Medication Sig Dispense Refill  . amiodarone (PACERONE) 100 MG tablet Take 1 tablet (100 mg total) by mouth every morning. 90 tablet 2  . aspirin 81 MG tablet Take 81 mg by mouth daily.    Marland Kitchen Epoetin Alfa (PROCRIT IJ) Inject 10,000 Units as directed every 28 (twenty-eight) days. As directed every 4 weeks.    . furosemide (LASIX) 40 MG tablet Take 40 mg by mouth daily.    Marland Kitchen glipiZIDE (GLUCOTROL XL) 5 MG 24 hr tablet Take 5 mg by mouth daily.    . hydrALAZINE (APRESOLINE) 50 MG tablet TAKE 1 TABLET BY MOUTH TWICE A DAY 180 tablet 3  . levothyroxine (SYNTHROID, LEVOTHROID) 100 MCG tablet Take 1 tablet (100 mcg total) by mouth daily before breakfast. 30 tablet 9  . Melatonin 5 MG CAPS Take 5 mg by mouth daily.    . methocarbamol (ROBAXIN) 500 MG tablet Take 500 mg by mouth daily as needed for muscle spasms.    . Multiple Vitamin (MULTIVITAMIN WITH MINERALS) TABS tablet Take 1 tablet by mouth daily.    Marland Kitchen omega-3 acid ethyl esters (LOVAZA) 1 g capsule TAKE 2 CAPSULES BY MOUTH 2 TIMES DAILY. 360 capsule 3  . potassium chloride (K-DUR) 10 MEQ tablet Take 10 mEq by mouth every morning.     . pravastatin (PRAVACHOL) 20 MG tablet TAKE 1 TABLET BY MOUTH AT BEDTIME 30 tablet 9  . traMADol (ULTRAM) 50 MG tablet Take 50 mg by mouth every 6 (six) hours as needed for moderate pain.    Marland Kitchen amLODipine (NORVASC) 5 MG tablet Take 1 tablet (5 mg total) by mouth daily. 30 tablet 3  . telmisartan (MICARDIS) 80 MG tablet Take 1 tablet (80 mg total) by mouth daily. 90 tablet 3   No current facility-administered medications for this visit.     Allergies: No Known Allergies  Social History: The patient  reports that she has never smoked. She has never used smokeless tobacco. She reports that she does not drink alcohol or use drugs.   Family History: The patient's family  history includes Heart disease in her father and mother.   Review of Systems: Please see the history of present illness.   Otherwise, the review of systems is positive for none.   All other systems are reviewed and negative.   Physical Exam: VS:  BP 120/68   Pulse 90   Ht 5\' 2"  (1.575 m)   Wt 150 lb (68 kg)   BMI 27.44 kg/m  .  BMI Body mass  index is 27.44 kg/m.  Wt Readings from Last 3 Encounters:  07/25/16 150 lb (68 kg)  06/22/16 158 lb 3.2 oz (71.8 kg)  06/06/16 160 lb 3.2 oz (72.7 kg)    General: Chronically ill. Weight down 8 pounds more since I last saw. Color is sallow.   HEENT: Normal.  Neck: Supple, no JVD, carotid bruits, or masses noted.  Cardiac: Regular rate and rhythm. Heart tones are distant. +1 edema - more so in the right leg.   Respiratory:  Lungs are clear to auscultation bilaterally with normal work of breathing.  GI: Soft and nontender.  MS: No deformity or atrophy. Gait not tested. She is in a wheelchair today.  Skin: Warm and dry. Color is normal.  Neuro:  Strength and sensation are intact and no gross focal deficits noted.  Psych: Alert, appropriate and with normal affect.   LABORATORY DATA:  EKG:  EKG is ordered today. She is in NSR today. Low voltage noted.   Lab Results  Component Value Date   WBC 5.5 05/17/2016   HGB 8.6 (A) 05/17/2016   HCT 28 (A) 05/17/2016   PLT 204 05/17/2016   GLUCOSE 127 (H) 05/12/2016   CHOL 106 04/04/2016   TRIG 210 (H) 04/04/2016   HDL 21 (L) 04/04/2016   LDLCALC 43 04/04/2016   ALT 8 (L) 05/11/2016   AST 19 05/11/2016   NA 136 (A) 05/17/2016   K 5.3 05/17/2016   CL 108 05/12/2016   CREATININE 1.9 (A) 05/17/2016   BUN 41 (A) 05/17/2016   CO2 22 05/12/2016   TSH 1.45 04/04/2016   INR 1.11 06/18/2014   HGBA1C 6.2 (H) 07/07/2011    BNP (last 3 results) No results for input(s): BNP in the last 8760 hours.  ProBNP (last 3 results) No results for input(s): PROBNP in the last 8760 hours.   Other Studies  Reviewed Today:  Echo Study Conclusions from September 2015  - Left ventricle: The cavity size was normal. Wall thickness was normal. Systolic function was normal. The estimated ejection fraction was in the range of 60% to 65%. Left ventricular diastolic function parameters were normal. - Mitral valve: There was mild regurgitation. - Left atrium: The atrium was mildly dilated. - Right atrium: The atrium was mildly dilated.  Assessment / Plan: 1. Need for medication clarification - not clear to me why she is on both Lasix and HCTZ. May not be getting MicardisHCT - very unclear. Weight down further. I suspect she does not need all this antihypertensives any more. Cutting Norvasc back to 5 mg a day. Placing on just plain Micardis. New med list given today and we will call [facility] to verify and update as well. We have given 2 copies of the AVS today. Lab today. Unfortunately, I do not see this situation going well.   2. History of VT - maintained on chronic low dose amiodarone therapy - was quite bradycardic at prior visit back in the summer - stopped her Atenolol - this has improved.   3. Asymptomatic bradycardia - resolved with cessation of beta blocker.   4. HTN - see #1.   5. HLD - on statin therapy  6. Mild LV dysfunction - most recent echo showing normal EF. She does not seem to be symptomatic.  7. Hypothyoidism - Rechecking TSH again today.   8. History of abnormal PFTs - just followed clinically - fortunately, she her shortness of breath seems stable.   9. Weight loss - still worrisome to  me - lab today. Will follow. Probable needs further reduction in her medicines going forward.    Current medicines are reviewed with the patient today.  The patient does not have concerns regarding medicines other than what has been noted above.  The following changes have been made:  See above.  Labs/ tests ordered today include:    Orders Placed This Encounter  Procedures   . Basic metabolic panel  . CBC  . TSH  . Hepatic function panel  . EKG 12-Lead     Disposition:   FU with me in 2 weeks  Patient is agreeable to this plan and will call if any problems develop in the interim.   SignedTruitt Merle, NP  07/25/2016 9:43 AM  [facility] [address] [place], [place]  [address] Phone: [phone] Fax: [phone]

## 2016-07-25 ENCOUNTER — Ambulatory Visit (INDEPENDENT_AMBULATORY_CARE_PROVIDER_SITE_OTHER): Payer: Medicare Other | Admitting: Nurse Practitioner

## 2016-07-25 ENCOUNTER — Other Ambulatory Visit: Payer: Self-pay | Admitting: *Deleted

## 2016-07-25 ENCOUNTER — Encounter: Payer: Self-pay | Admitting: Nurse Practitioner

## 2016-07-25 VITALS — BP 120/68 | HR 90 | Ht 62.0 in | Wt 150.0 lb

## 2016-07-25 DIAGNOSIS — Z79899 Other long term (current) drug therapy: Secondary | ICD-10-CM

## 2016-07-25 MED ORDER — TELMISARTAN 80 MG PO TABS: 80.0000 mg | ORAL_TABLET | Freq: Every day | ORAL | 3 refills | Status: DC

## 2016-07-25 MED ORDER — ASPIRIN 81 MG PO TABS: 81.0000 mg | ORAL_TABLET | Freq: Every day | ORAL | 3 refills | Status: DC

## 2016-07-25 MED ORDER — FUROSEMIDE 40 MG PO TABS: 40.0000 mg | ORAL_TABLET | Freq: Every day | ORAL | 3 refills | Status: DC

## 2016-07-25 MED ORDER — AMLODIPINE BESYLATE 5 MG PO TABS: 5.0000 mg | ORAL_TABLET | Freq: Every day | ORAL | 3 refills | Status: DC

## 2016-07-25 MED ORDER — LEVOTHYROXINE SODIUM 100 MCG PO TABS: 100.0000 ug | ORAL_TABLET | Freq: Every day | ORAL | 9 refills | Status: DC

## 2016-07-25 NOTE — Patient Instructions (Addendum)
We will be checking the following labs today - BMET, CBC, HPF and TSH   Medication Instructions:    Continue with your current medicines. BUT  GO by this list you are given today - I have made changes  Cutting Norvasc back to 5 mg a day - [facility]  Stopping Micardis HCT and placing just on plain Micardis 80 mg a day - [facility]  We will call [facility] today to give this list of medicines.     Testing/Procedures To Be Arranged:  N/A  Follow-Up:   See me in 2 weeks.     Other Special Instructions:   N/A    If you need a refill on your cardiac medications before your next appointment, please call your pharmacy.   Call the [place] office at [phone] if you have any questions, problems or concerns.

## 2016-07-25 NOTE — Telephone Encounter (Signed)
S/w Lennette Bihari at [facility] to give update on pts' medication list @ [phone].  Sent all pts medications in including OTC meds.  Could not send in Robaxin and Tramdaol.  S/w pt's daughter per DPR is aware everything was sent to [facility].

## 2016-07-26 ENCOUNTER — Ambulatory Visit: Payer: Medicare Other | Admitting: Nurse Practitioner

## 2016-07-26 DIAGNOSIS — D631 Anemia in chronic kidney disease: Secondary | ICD-10-CM | POA: Diagnosis not present

## 2016-07-26 DIAGNOSIS — N184 Chronic kidney disease, stage 4 (severe): Secondary | ICD-10-CM | POA: Diagnosis not present

## 2016-07-26 DIAGNOSIS — E1122 Type 2 diabetes mellitus with diabetic chronic kidney disease: Secondary | ICD-10-CM | POA: Diagnosis not present

## 2016-07-26 DIAGNOSIS — S82141D Displaced bicondylar fracture of right tibia, subsequent encounter for closed fracture with routine healing: Secondary | ICD-10-CM | POA: Diagnosis not present

## 2016-07-26 DIAGNOSIS — M109 Gout, unspecified: Secondary | ICD-10-CM | POA: Diagnosis not present

## 2016-07-26 DIAGNOSIS — I129 Hypertensive chronic kidney disease with stage 1 through stage 4 chronic kidney disease, or unspecified chronic kidney disease: Secondary | ICD-10-CM | POA: Diagnosis not present

## 2016-07-26 LAB — TSH: TSH: 11.11 u[IU]/mL — ABNORMAL HIGH (ref 0.450–4.500)

## 2016-07-26 LAB — BASIC METABOLIC PANEL
BUN/Creatinine Ratio: 18 (ref 12–28)
BUN: 31 mg/dL — ABNORMAL HIGH (ref 8–27)
CO2: 14 mmol/L — ABNORMAL LOW (ref 18–29)
Calcium: 9.2 mg/dL (ref 8.7–10.3)
Chloride: 105 mmol/L (ref 96–106)
Creatinine, Ser: 1.72 mg/dL — ABNORMAL HIGH (ref 0.57–1.00)
GFR calc Af Amer: 31 mL/min/{1.73_m2} — ABNORMAL LOW (ref >59)
GFR calc non Af Amer: 27 mL/min/{1.73_m2} — ABNORMAL LOW (ref >59)
Glucose: 151 mg/dL — ABNORMAL HIGH (ref 65–99)
Potassium: 4.5 mmol/L (ref 3.5–5.2)
Sodium: 139 mmol/L (ref 134–144)

## 2016-07-26 LAB — CBC
Hematocrit: 29.9 % — ABNORMAL LOW (ref 34.0–46.6)
Hemoglobin: 9.7 g/dL — ABNORMAL LOW (ref 11.1–15.9)
MCH: 29.1 pg (ref 26.6–33.0)
MCHC: 32.4 g/dL (ref 31.5–35.7)
MCV: 90 fL (ref 79–97)
Platelets: 148 10*3/uL — ABNORMAL LOW (ref 150–379)
RBC: 3.33 x10E6/uL — ABNORMAL LOW (ref 3.77–5.28)
RDW: 18.6 % — ABNORMAL HIGH (ref 12.3–15.4)
WBC: 6.8 10*3/uL (ref 3.4–10.8)

## 2016-07-26 LAB — HEPATIC FUNCTION PANEL
ALT: 11 IU/L (ref 0–32)
AST: 23 IU/L (ref 0–40)
Albumin: 4 g/dL (ref 3.5–4.7)
Alkaline Phosphatase: 104 IU/L (ref 39–117)
Bilirubin Total: 0.2 mg/dL (ref 0.0–1.2)
Bilirubin, Direct: 0.09 mg/dL (ref 0.00–0.40)
Total Protein: 6.7 g/dL (ref 6.0–8.5)

## 2016-07-28 DIAGNOSIS — E1122 Type 2 diabetes mellitus with diabetic chronic kidney disease: Secondary | ICD-10-CM | POA: Diagnosis not present

## 2016-07-28 DIAGNOSIS — M109 Gout, unspecified: Secondary | ICD-10-CM | POA: Diagnosis not present

## 2016-07-28 DIAGNOSIS — D631 Anemia in chronic kidney disease: Secondary | ICD-10-CM | POA: Diagnosis not present

## 2016-07-28 DIAGNOSIS — I129 Hypertensive chronic kidney disease with stage 1 through stage 4 chronic kidney disease, or unspecified chronic kidney disease: Secondary | ICD-10-CM | POA: Diagnosis not present

## 2016-07-28 DIAGNOSIS — N184 Chronic kidney disease, stage 4 (severe): Secondary | ICD-10-CM | POA: Diagnosis not present

## 2016-07-28 DIAGNOSIS — S82141D Displaced bicondylar fracture of right tibia, subsequent encounter for closed fracture with routine healing: Secondary | ICD-10-CM | POA: Diagnosis not present

## 2016-07-31 ENCOUNTER — Ambulatory Visit (INDEPENDENT_AMBULATORY_CARE_PROVIDER_SITE_OTHER): Payer: Medicare Other | Admitting: Orthopaedic Surgery

## 2016-08-01 ENCOUNTER — Ambulatory Visit (INDEPENDENT_AMBULATORY_CARE_PROVIDER_SITE_OTHER): Payer: Medicare Other | Admitting: Orthopaedic Surgery

## 2016-08-01 ENCOUNTER — Encounter: Payer: Self-pay | Admitting: Internal Medicine

## 2016-08-01 NOTE — Progress Notes (Signed)
Location:  [place] Room Number: 103P Place of Service:  SNF (31)  Leslie Coleman. Leslie Coil, MD  Patient Care Team: [name], [name], MD as Consulting Physician (Orthopedic Surgery)  Extended Emergency Contact Information Primary Emergency Contact: Mcleish,Ervin Address: [place], [place] Phone: [phone] Relation: Spouse Secondary Emergency Contact: Manning,Cindy  [place] Mobile Phone: [phone] Relation: Daughter    Allergies: Patient has no known allergies.  Chief Complaint  Patient presents with  . Acute Visit    HPI: Patient is 83 y.o. female who is being seen acutely because an outbreak of Influenza A per CDC guidelines was recognized on 06/21/2016. Pt has no c/o flu like symptoms;therefore pt will need to be prophylaxed with Tamiflu for a minimum of 14 days per CDC protocol.  Past Medical History:  Diagnosis Date  . Abnormal PFTs (pulmonary function tests) 2008   No obstruction noted. Has been followed clinically.  . Anemia    treated with Procrit  . Fall from slip, trip, or stumble 05/07/2016   fell at church resulting in right tibial plateau fracture  . Gout   . High risk medication use    on amiodarone.  Marland Kitchen HTN (hypertension)    Last echo in 2004; EF normal  . Hyperlipidemia   . Hypothyroidism    on replacement  . Obesity   . Type II diabetes mellitus ([place])   . V-tach [facility])    remote VT, treated initially with procainamide, on amiodarone since 2008    Past Surgical History:  Procedure Laterality Date  . CARDIOVASCULAR STRESS TEST  07/07/2003   EF 70%. NO EVIDENCE OF ISCHEMIA  . CARDIOVERSION  1978  . CATARACT EXTRACTION, BILATERAL Bilateral   . CESAREAN SECTION     x 3  . ORIF TIBIA PLATEAU Right 05/10/2016   Procedure: OPEN REDUCTION INTERNAL FIXATION (ORIF) RIGHT TIBIAL PLATEAU  WITH  BONE GRAFT;  Surgeon: Leandrew Koyanagi, MD;  Location: [place];  Service: Orthopedics;  Laterality: Right;  . US ECHOCARDIOGRAPHY  12/29/2002   EF 60-65%  . VAGINAL HYSTERECTOMY      Allergies as of 06/21/2016   No Known Allergies     Medication List       Accurate as of 06/21/16 11:59 PM. Always use your most recent med list.          amiodarone 100 MG tablet Commonly known as:  PACERONE Take 1 tablet (100 mg total) by mouth every morning.   amLODipine 5 MG tablet Commonly known as:  NORVASC Take 1 tablet (5 mg total) by mouth daily.   aspirin 81 MG tablet Take 1 tablet (81 mg total) by mouth daily.   furosemide 40 MG tablet Commonly known as:  LASIX Take 1 tablet (40 mg total) by mouth daily.   glipiZIDE 5 MG 24 hr tablet Commonly known as:  GLUCOTROL XL Take 5 mg by mouth daily.   hydrALAZINE 50 MG tablet Commonly known as:  APRESOLINE TAKE 1 TABLET BY MOUTH TWICE A DAY   levothyroxine 100 MCG tablet Commonly known as:  SYNTHROID, LEVOTHROID Take 1 tablet (100 mcg total) by mouth daily before breakfast.   Melatonin 5 MG Caps Take 5 mg by mouth daily.   methocarbamol 500 MG tablet Commonly known as:  ROBAXIN Take 500 mg by mouth daily as needed  for muscle spasms.   multivitamin with minerals Tabs tablet Take 1 tablet by mouth daily.   omega-3 acid ethyl esters 1 g capsule Commonly known as:  LOVAZA TAKE 2 CAPSULES BY MOUTH 2 TIMES DAILY.   potassium chloride 10 MEQ tablet Commonly known as:  K-DUR Take 10 mEq by mouth every morning.   pravastatin 20 MG tablet Commonly known as:  PRAVACHOL TAKE 1 TABLET BY MOUTH AT BEDTIME   PROCRIT IJ Inject 10,000 Units as directed every 28 (twenty-eight) days. As directed every 4 weeks.   telmisartan 80 MG tablet Commonly known as:  MICARDIS Take 1 tablet (80 mg total) by mouth daily.   traMADol 50 MG tablet Commonly known as:  ULTRAM Take 50 mg by mouth every 6 (six) hours as needed for moderate pain.        No orders of the defined types were placed in this encounter.   Immunization History  Administered Date(s) Administered  . PPD Test 05/12/2016    Social History  Substance Use Topics  . Smoking status: Never Smoker  . Smokeless tobacco: Never Used  . Alcohol use No    Review of Systems  DATA OBTAINED: from patient, nurse GENERAL:  no fevers SKIN: No itching, rash HEENT: no rhinorrhea, congestion, ST or ear pain RESPIRATORY: No cough, wheezing, SOB CARDIAC: No chest pain, palpitations, lower extremity edema  GI: No abdominal pain, No N/V/D or constipation, No heartburn or reflux  MUSCULOSKELETAL: No muscle aches NEUROLOGIC: No headache, dizziness   Vitals:   06/21/16 1445  BP: (!) 143/70  Pulse: 75  Resp: 18  Temp: 98.7 F (37.1 C)   Body mass index is 26.63 kg/m. Physical Exam  GENERAL APPEARANCE: Alert,  No acute distress  SKIN: No diaphoresis rash HEENT: Unremarkable RESPIRATORY: Breathing is even, unlabored. Lung sounds are clear   CARDIOVASCULAR: Heart RRR no murmurs, rubs or gallops. No peripheral edema  GASTROINTESTINAL: Abdomen is soft, non-tender, not distended w/ normal bowel sounds.   NEUROLOGIC: Cranial nerves 2-12 grossly intact PSYCHIATRIC: baseline, no mental status changes  Patient Active Problem List   Diagnosis Date Noted  . CKD (chronic kidney disease), stage III 05/20/2016  . Hypothyroidism 05/20/2016  . Closed fracture of right tibial plateau 05/12/2016  . Anemia in chronic kidney disease 12/16/2015  . Dizzy 07/07/2011  . Dehydration 07/07/2011  . Type II diabetes mellitus ([place]) 07/06/2011  . History of long-term treatment with high-risk medication 08/29/2010  . Abnormal PFTs (pulmonary function tests)   . History of ventricular tachycardia   . HTN (hypertension)   . Hyperlipidemia   . Gout     CMP     Component Value Date/Time   NA 139 07/25/2016 1002   K 4.5 07/25/2016 1002   CL 105 07/25/2016 1002   CO2 14 (L)  07/25/2016 1002   GLUCOSE 151 (H) 07/25/2016 1002   GLUCOSE 127 (H) 05/12/2016 0455   BUN 31 (H) 07/25/2016 1002   CREATININE 1.72 (H) 07/25/2016 1002   CREATININE 1.99 (H) 04/04/2016 0910   CALCIUM 9.2 07/25/2016 1002   PROT 6.7 07/25/2016 1002   ALBUMIN 4.0 07/25/2016 1002   AST 23 07/25/2016 1002   ALT 11 07/25/2016 1002   ALKPHOS 104 07/25/2016 1002   BILITOT 0.2 07/25/2016 1002   GFRNONAA 27 (L) 07/25/2016 1002   GFRAA 31 (L) 07/25/2016 1002    Recent Labs  05/11/16 0433 05/12/16 0455 05/17/16 07/25/16 1002  NA 139  139 137 136* 139  K 4.0  4.0 4.4 5.3 4.5  CL 111 108  --  105  CO2 20* 22  --  14*  GLUCOSE 82 127*  --  151*  BUN 21  21* 20 41* 31*  CREATININE 1.80*  1.8* 1.66* 1.9* 1.72*  CALCIUM 8.4* 8.3*  --  9.2    Recent Labs  05/10/16 0336 05/11/16 0433 07/25/16 1002  AST 19 19 23   ALT 10* 8* 11  ALKPHOS 62 59 104  BILITOT 0.8 0.5 0.2  PROT 5.3* 5.2* 6.7  ALBUMIN 2.3* 2.3* 4.0    Recent Labs  09/29/15 1841  05/08/16 1141  05/11/16 0433 05/12/16 0455 05/17/16 07/25/16 1002  WBC 20.6*  < > 8.2  8.2  < > 5.1  5.1 6.5 5.5 6.8  NEUTROABS 16.6*  --  5.6  --   --   --   --   --   HGB 9.8*  < > 9.5*  9.5*  < > 8.3*  8.3* 8.2* 8.6*  --   HCT 29.4*  < > 28.4*  28*  < > 26.1*  26* 25.9* 28* 29.9*  MCV 86.7  < > 87.4  < > 88.8 90.2  --  90  PLT 106*  < > 151  151  < > 122*  122* 121* 204 148*  < > = values in this interval not displayed.  Recent Labs  11/01/15 0846 04/04/16 0910  CHOL 104* 106  LDLCALC 47 43  TRIG 171* 210*   No results found for: [facility] Lab Results  Component Value Date   TSH 11.110 (H) 07/25/2016   Lab Results  Component Value Date   HGBA1C 6.2 (H) 07/07/2011   Lab Results  Component Value Date   CHOL 106 04/04/2016   HDL 21 (L) 04/04/2016   LDLCALC 43 04/04/2016   TRIG 210 (H) 04/04/2016   CHOLHDL 5.0 (H) 04/04/2016    Significant Diagnostic Results in last 30 days:  No results  found.  Assessment and Plan  EXPOSURE TO FLU/ INFLUENZA OUTBREAK AT SNF-   CrCl calculated by me-  23.9       Dose for 14 days-  30 mg daily Pt will be monitored daily for flu like symptoms                                                                                 Webb Silversmith D. Leslie Coil, MD

## 2016-08-02 DIAGNOSIS — N184 Chronic kidney disease, stage 4 (severe): Secondary | ICD-10-CM | POA: Diagnosis not present

## 2016-08-02 DIAGNOSIS — I129 Hypertensive chronic kidney disease with stage 1 through stage 4 chronic kidney disease, or unspecified chronic kidney disease: Secondary | ICD-10-CM | POA: Diagnosis not present

## 2016-08-02 DIAGNOSIS — E1122 Type 2 diabetes mellitus with diabetic chronic kidney disease: Secondary | ICD-10-CM | POA: Diagnosis not present

## 2016-08-02 DIAGNOSIS — M109 Gout, unspecified: Secondary | ICD-10-CM | POA: Diagnosis not present

## 2016-08-02 DIAGNOSIS — D631 Anemia in chronic kidney disease: Secondary | ICD-10-CM | POA: Diagnosis not present

## 2016-08-02 DIAGNOSIS — S82141D Displaced bicondylar fracture of right tibia, subsequent encounter for closed fracture with routine healing: Secondary | ICD-10-CM | POA: Diagnosis not present

## 2016-08-04 ENCOUNTER — Other Ambulatory Visit: Payer: Self-pay | Admitting: *Deleted

## 2016-08-04 ENCOUNTER — Ambulatory Visit (INDEPENDENT_AMBULATORY_CARE_PROVIDER_SITE_OTHER): Payer: Medicare Other

## 2016-08-04 ENCOUNTER — Ambulatory Visit (INDEPENDENT_AMBULATORY_CARE_PROVIDER_SITE_OTHER): Payer: Medicare Other | Admitting: Orthopaedic Surgery

## 2016-08-04 ENCOUNTER — Telehealth: Payer: Self-pay | Admitting: Nurse Practitioner

## 2016-08-04 DIAGNOSIS — S82141D Displaced bicondylar fracture of right tibia, subsequent encounter for closed fracture with routine healing: Secondary | ICD-10-CM

## 2016-08-04 MED ORDER — FUROSEMIDE 40 MG PO TABS
40.0000 mg | ORAL_TABLET | Freq: Every day | ORAL | 0 refills | Status: DC
Start: 2016-08-04 — End: 2016-10-20

## 2016-08-04 MED ORDER — AMIODARONE HCL 100 MG PO TABS: 100.0000 mg | ORAL_TABLET | Freq: Every morning | ORAL | 0 refills | Status: DC

## 2016-08-04 NOTE — Progress Notes (Signed)
Leslie Coleman is 3 months status post ORIF right tibial plateau fracture. She is doing well. She is ambulating with a walker. She denies any pain or swelling. The surgical scar has healed. No signs of infection. She has excellent range of motion of the lunate. X-ray show healed tibial plateau fracture without complications. At this point we will have her follow up with me as needed.

## 2016-08-04 NOTE — Telephone Encounter (Signed)
New Message     *STAT* If patient is at the pharmacy, call can be transferred to refill team.   1. Which medications need to be refilled? (please list name of each medication and dose if known) amiodarone (PACERONE) 100 MG tablet furosemide (LASIX) 40 MG tablet  2. Which pharmacy/location (including street and city if local pharmacy) is medication to be sent to?[id number] [place], [place] [address]  3. Do they need a 30 day or 90 day supply? Per daughter needing 7 day supply for both since pt has recently switched to [facility], and has not had medication in two days.

## 2016-08-07 NOTE — Progress Notes (Signed)
CARDIOLOGY OFFICE NOTE  Date:  08/08/2016    Leslie Coleman Date of Birth: 1929-05-20 Medical Record #[id number]  PCP:  Leslie Seashore, MD  Cardiologist:  Leslie Coleman & Leslie Coleman   Chief Complaint  Patient presents with  . Follow-up    Recheck for med clarification - seen for Dr. Rayann Coleman    History of Present Illness: Leslie Coleman is a 83 y.o. female who presents today for a follow up visit. Seen for Dr. Rayann Coleman - former patient of Dr. Susa Coleman.   She has a remote history of VT - treated initially with procainamide but switched to amiodarone back in 2008 due to unavailability of the medicine. Does have chronically abnormal PFTs. Other issues include HTN, gout, obesity, DM and anemia. On chronic Procrit.   I saw her back in September &October of 2015 - seemed to be getting feeble. Had a cold. Chronic DOE noted. More swelling. Weight was up. I added Lasix for a few days and she ended up taking just prn. Cut her atenolol back due to bradycardia and updated her labs and echo. TSH was elevated and we increased her thyroid medicine. She saw Dr. Rayann Coleman in June of 2016 and was felt to be stable.   She was in the ER back in May of 2017 - was dizzy. Looks like she was treated with IVF and antibiotics. Her HR was 42 by EKG - this was notaddressed upon my review. I then saw her in [place] her regular check - she remained bradycardic - she was asymptomatic - I stopped her Atenolol. Last visit with me was back in November and she was doing well. Cardiac status seemed ok. She had lost a significant amount of weight - supposedly intentional.   Several phone calls in February to discuss medicines. Had been at some rehab facility after falling and breaking her leg - medicines very difficult to clarify - she was asked to come in and bring all her bottles and instructions with her so we could straighten out. I then saw her - she had multiple lists, very confused overall about what she was taking.  Daughter did not seem to know what she was taking as well. I attempted to straighten this out.   Comes in today. Here with Leslie Coleman's husband today. She remains in a wheelchair. Looks a little better today. Seems stronger and more alert. Has been out of her amiodarone for a few days - cost "too much for a few days worth, so waiting on mail order". BP higher today. She is walking now - using her walker - no more falls. She and her family are happy with how she is doing.   Past Medical History:  Diagnosis Date  . Abnormal PFTs (pulmonary function tests) 2008   No obstruction noted. Has been followed clinically.  . Anemia    treated with Procrit  . Fall from slip, trip, or stumble 05/07/2016   [place] resulting in right tibial plateau fracture  . Gout   . High risk medication use    on amiodarone.  Marland Kitchen HTN (hypertension)    Last echo in 2004; EF normal  . Hyperlipidemia   . Hypothyroidism    on replacement  . Obesity   . Type II diabetes mellitus ([place])   . V-tach [facility])    remote VT, treated initially with procainamide, on amiodarone since 2008    Past Surgical History:  Procedure Laterality Date  . CARDIOVASCULAR STRESS TEST  07/07/2003  EF 70%. NO EVIDENCE OF ISCHEMIA  . CARDIOVERSION  1978  . CATARACT EXTRACTION, BILATERAL Bilateral   . CESAREAN SECTION     x 3  . ORIF TIBIA PLATEAU Right 05/10/2016   Procedure: OPEN REDUCTION INTERNAL FIXATION (ORIF) RIGHT TIBIAL PLATEAU  WITH BONE GRAFT;  Surgeon: Leslie Koyanagi, MD;  Location: [facility];  Service: Orthopedics;  Laterality: Right;  . US ECHOCARDIOGRAPHY  12/29/2002   EF 60-65%  . VAGINAL HYSTERECTOMY       Medications: Current Outpatient Prescriptions  Medication Sig Dispense Refill  . amiodarone (PACERONE) 100 MG tablet Take 1 tablet (100 mg total) by mouth every morning. 7 tablet 0  . amLODipine (NORVASC) 5 MG tablet Take 1 tablet (5 mg total) by mouth daily. 30 tablet 3  . aspirin 81 MG tablet Take 1 tablet (81 mg  total) by mouth daily. 30 tablet 3  . Epoetin Alfa (PROCRIT IJ) Inject 10,000 Units as directed every 28 (twenty-eight) days. As directed every 4 weeks.    . furosemide (LASIX) 40 MG tablet Take 1 tablet (40 mg total) by mouth daily. 7 tablet 0  . glipiZIDE (GLUCOTROL XL) 5 MG 24 hr tablet Take 5 mg by mouth daily.    . hydrALAZINE (APRESOLINE) 50 MG tablet TAKE 1 TABLET BY MOUTH TWICE A DAY 180 tablet 3  . levothyroxine (SYNTHROID, LEVOTHROID) 100 MCG tablet Take 1 tablet (100 mcg total) by mouth daily before breakfast. 30 tablet 9  . Melatonin 5 MG CAPS Take 5 mg by mouth daily.    . methocarbamol (ROBAXIN) 500 MG tablet Take 500 mg by mouth daily as needed for muscle spasms.    . Multiple Vitamin (MULTIVITAMIN WITH MINERALS) TABS tablet Take 1 tablet by mouth daily.    Marland Kitchen omega-3 acid ethyl esters (LOVAZA) 1 g capsule TAKE 2 CAPSULES BY MOUTH 2 TIMES DAILY. 360 capsule 3  . potassium chloride (K-DUR) 10 MEQ tablet Take 10 mEq by mouth every morning.     . pravastatin (PRAVACHOL) 20 MG tablet TAKE 1 TABLET BY MOUTH AT BEDTIME 30 tablet 9  . telmisartan (MICARDIS) 80 MG tablet Take 1 tablet (80 mg total) by mouth daily. 30 tablet 3  . traMADol (ULTRAM) 50 MG tablet Take 50 mg by mouth every 6 (six) hours as needed for moderate pain.     No current facility-administered medications for this visit.     Allergies: No Known Allergies  Social History: The patient  reports that she has never smoked. She has never used smokeless tobacco. She reports that she does not drink alcohol or use drugs.   Family History: The patient's family history includes Heart disease in her father and mother.   Review of Systems: Please see the history of present illness.   Otherwise, the review of systems is positive for none.   All other systems are reviewed and negative.   Physical Exam: VS:  BP (!) 160/80   Pulse 68   Ht 5\' 2"  (1.575 m)   Wt 149 lb 6.4 oz (67.8 kg)   SpO2 100% Comment: at rest  BMI 27.33  kg/m  .  BMI Body mass index is 27.33 kg/m.  Wt Readings from Last 3 Encounters:  08/08/16 149 lb 6.4 oz (67.8 kg)  07/25/16 150 lb (68 kg)  06/22/16 158 lb 3.2 oz (71.8 kg)   BP is 120/80 by my check.   General: Pleasant. Chronically ill but alert and in no acute distress. She is in a  wheelchair. She has lost another pound.  Down 21 pounds since last July. She looks more alert and stronger to me today.  HEENT: Normal.  Neck: Supple, no JVD, carotid bruits, or masses noted.  Cardiac: Regular rate and rhythm. No murmurs, rubs, or gallops. Trace edema.  Respiratory:  Lungs are clear to auscultation bilaterally with normal work of breathing.  GI: Soft and nontender.  MS: No deformity or atrophy. Gait not tested.  Skin: Warm and dry. Color is normal.  Neuro:  Strength and sensation are intact and no gross focal deficits noted.  Psych: Alert, appropriate and with normal affect.   LABORATORY DATA:  EKG:  EKG is not ordered today.  Lab Results  Component Value Date   WBC 6.8 07/25/2016   HGB 8.6 (A) 05/17/2016   HCT 29.9 (L) 07/25/2016   PLT 148 (L) 07/25/2016   GLUCOSE 151 (H) 07/25/2016   CHOL 106 04/04/2016   TRIG 210 (H) 04/04/2016   HDL 21 (L) 04/04/2016   LDLCALC 43 04/04/2016   ALT 11 07/25/2016   AST 23 07/25/2016   NA 139 07/25/2016   K 4.5 07/25/2016   CL 105 07/25/2016   CREATININE 1.72 (H) 07/25/2016   BUN 31 (H) 07/25/2016   CO2 14 (L) 07/25/2016   TSH 11.110 (H) 07/25/2016   INR 1.11 06/18/2014   HGBA1C 6.2 (H) 07/07/2011    BNP (last 3 results) No results for input(s): BNP in the last 8760 hours.  ProBNP (last 3 results) No results for input(s): PROBNP in the last 8760 hours.   Other Studies Reviewed Today:  Echo Study Conclusions from September 2015  - Left ventricle: The cavity size was normal. Wall thickness was normal. Systolic function was normal. The estimated ejection fraction was in the range of 60% to 65%. Left ventricular diastolic  function parameters were normal. - Mitral valve: There was mild regurgitation. - Left atrium: The atrium was mildly dilated. - Right atrium: The atrium was mildly dilated.  Assessment / Plan:  1. Need for medication clarification - was not clear to me why she was on both Lasix and HCTZ. She had lost a significant amount of weight - I cut the Norvasc back to 5 mg a day. Placed on just plain Micardis. She looks better today. BP by me is fine. I have left her on her current regimen. See back in 3 months.   2. History of VT - maintained on chronic low dose amiodarone therapy - was quite bradycardic at prior visit back in the summer- stopped her Atenolol - this has improved. Needs repeat labs today.   3. Asymptomatic bradycardia - resolved with cessation of beta blocker.   4. HTN - see #1. Recheck by me is fine.   5. HLD - on statin therapy  6. Mild LV dysfunction - most recent echo showing normal EF. She does not seem to be symptomatic.  7. Hypothyoidism - rechecking TSH today. I'm hoping that she is back on her medicines more consistently now.    8. History of abnormal PFTs - just followed clinically - fortunately, she her shortness of breath seems stable. Not really noted today.   9. Weight loss - still worrisome to me - lab repeated today. Will follow. I have left her on her current regimen for now.   Current medicines are reviewed with the patient today.  The patient does not have concerns regarding medicines other than what has been noted above.  The following changes have been  made:  See above.  Labs/ tests ordered today include:    Orders Placed This Encounter  Procedures  . Basic metabolic panel  . CBC  . Hepatic function panel  . TSH     Disposition:   FU with me in 3 months.   Patient is agreeable to this plan and will call if any problems develop in the interim.   SignedTruitt Merle, NP  08/08/2016 10:06 AM  [facility] [address] [place], [place]  [address] Phone: [phone] Fax: [phone]

## 2016-08-08 ENCOUNTER — Encounter: Payer: Self-pay | Admitting: Nurse Practitioner

## 2016-08-08 ENCOUNTER — Ambulatory Visit: Payer: Medicare Other | Admitting: Nurse Practitioner

## 2016-08-08 ENCOUNTER — Ambulatory Visit (INDEPENDENT_AMBULATORY_CARE_PROVIDER_SITE_OTHER): Payer: Medicare Other | Admitting: Nurse Practitioner

## 2016-08-08 VITALS — BP 160/80 | HR 68 | Ht 62.0 in | Wt 149.4 lb

## 2016-08-08 DIAGNOSIS — I10 Essential (primary) hypertension: Secondary | ICD-10-CM | POA: Diagnosis not present

## 2016-08-08 DIAGNOSIS — I472 Ventricular tachycardia, unspecified: Secondary | ICD-10-CM

## 2016-08-08 DIAGNOSIS — E7800 Pure hypercholesterolemia, unspecified: Secondary | ICD-10-CM | POA: Diagnosis not present

## 2016-08-08 DIAGNOSIS — Z79899 Other long term (current) drug therapy: Secondary | ICD-10-CM | POA: Diagnosis not present

## 2016-08-08 NOTE — Patient Instructions (Addendum)
We will be checking the following labs today - BMET, CBC, HPF and TSH   Medication Instructions:    Continue with your current medicines.     Testing/Procedures To Be Arranged:  N/A  Follow-Up:   See me in 3 months    Other Special Instructions:   N/A    If you need a refill on your cardiac medications before your next appointment, please call your pharmacy.   Call the [place] office at [phone] if you have any questions, problems or concerns.

## 2016-08-09 LAB — HEPATIC FUNCTION PANEL
ALT: 12 IU/L (ref 0–32)
AST: 22 IU/L (ref 0–40)
Albumin: 3.9 g/dL (ref 3.5–4.7)
Alkaline Phosphatase: 111 IU/L (ref 39–117)
Bilirubin Total: 0.3 mg/dL (ref 0.0–1.2)
Bilirubin, Direct: 0.11 mg/dL (ref 0.00–0.40)
Total Protein: 6.5 g/dL (ref 6.0–8.5)

## 2016-08-09 LAB — BASIC METABOLIC PANEL
BUN/Creatinine Ratio: 16 (ref 12–28)
BUN: 28 mg/dL — ABNORMAL HIGH (ref 8–27)
CO2: 19 mmol/L (ref 18–29)
Calcium: 9.2 mg/dL (ref 8.7–10.3)
Chloride: 103 mmol/L (ref 96–106)
Creatinine, Ser: 1.73 mg/dL — ABNORMAL HIGH (ref 0.57–1.00)
GFR calc Af Amer: 30 mL/min/{1.73_m2} — ABNORMAL LOW (ref >59)
GFR calc non Af Amer: 26 mL/min/{1.73_m2} — ABNORMAL LOW (ref >59)
Glucose: 144 mg/dL — ABNORMAL HIGH (ref 65–99)
Potassium: 4.3 mmol/L (ref 3.5–5.2)
Sodium: 141 mmol/L (ref 134–144)

## 2016-08-09 LAB — CBC
Hematocrit: 28.8 % — ABNORMAL LOW (ref 34.0–46.6)
Hemoglobin: 9.7 g/dL — ABNORMAL LOW (ref 11.1–15.9)
MCH: 30.5 pg (ref 26.6–33.0)
MCHC: 33.7 g/dL (ref 31.5–35.7)
MCV: 91 fL (ref 79–97)
Platelets: 163 10*3/uL (ref 150–379)
RBC: 3.18 x10E6/uL — ABNORMAL LOW (ref 3.77–5.28)
RDW: 17.9 % — ABNORMAL HIGH (ref 12.3–15.4)
WBC: 6.7 10*3/uL (ref 3.4–10.8)

## 2016-08-09 LAB — TSH: TSH: 5.39 u[IU]/mL — ABNORMAL HIGH (ref 0.450–4.500)

## 2016-08-12 ENCOUNTER — Encounter: Payer: Self-pay | Admitting: Internal Medicine

## 2016-08-21 ENCOUNTER — Encounter (HOSPITAL_COMMUNITY)
Admission: RE | Admit: 2016-08-21 | Discharge: 2016-08-21 | Disposition: A | Discharge status: Home / Self Care | Payer: Medicare Other | Source: Ambulatory Visit | Attending: Internal Medicine | Admitting: Internal Medicine

## 2016-08-21 DIAGNOSIS — D649 Anemia, unspecified: Secondary | ICD-10-CM | POA: Diagnosis not present

## 2016-08-21 DIAGNOSIS — D631 Anemia in chronic kidney disease: Secondary | ICD-10-CM

## 2016-08-21 DIAGNOSIS — N189 Chronic kidney disease, unspecified: Secondary | ICD-10-CM

## 2016-08-21 LAB — POCT HEMOGLOBIN-HEMACUE: HEMOGLOBIN: 9.4 g/dL — AB (ref 12.0–15.0)

## 2016-08-21 MED ORDER — EPOETIN ALFA 10000 UNIT/ML IJ SOLN
INTRAMUSCULAR | Status: AC
  Administered 2016-08-21: 09:00:00 10000 [IU] via SUBCUTANEOUS
  Filled 2016-08-21: qty 1

## 2016-08-21 MED ORDER — EPOETIN ALFA 10000 UNIT/ML IJ SOLN: 30000.0000 [IU] | INTRAMUSCULAR | Status: DC

## 2016-08-21 MED ORDER — EPOETIN ALFA 20000 UNIT/ML IJ SOLN
INTRAMUSCULAR | Status: AC
  Administered 2016-08-21: 20000 [IU] via SUBCUTANEOUS
  Filled 2016-08-21: qty 1

## 2016-09-11 ENCOUNTER — Ambulatory Visit: Payer: Medicare Other | Admitting: Nurse Practitioner

## 2016-09-18 ENCOUNTER — Encounter (HOSPITAL_COMMUNITY)
Admission: RE | Admit: 2016-09-18 | Discharge: 2016-09-18 | Disposition: A | Discharge status: Home / Self Care | Payer: Medicare Other | Source: Ambulatory Visit | Attending: Internal Medicine | Admitting: Internal Medicine

## 2016-09-18 DIAGNOSIS — D649 Anemia, unspecified: Secondary | ICD-10-CM | POA: Diagnosis not present

## 2016-09-18 DIAGNOSIS — N189 Chronic kidney disease, unspecified: Principal | ICD-10-CM

## 2016-09-18 DIAGNOSIS — D631 Anemia in chronic kidney disease: Secondary | ICD-10-CM

## 2016-09-18 LAB — POCT HEMOGLOBIN-HEMACUE: HEMOGLOBIN: 9.7 g/dL — AB (ref 12.0–15.0)

## 2016-09-18 MED ORDER — EPOETIN ALFA 20000 UNIT/ML IJ SOLN
INTRAMUSCULAR | Status: AC
  Administered 2016-09-18: 08:00:00 20000 [IU] via SUBCUTANEOUS
  Filled 2016-09-18: qty 1

## 2016-09-18 MED ORDER — EPOETIN ALFA 10000 UNIT/ML IJ SOLN
INTRAMUSCULAR | Status: AC
  Administered 2016-09-18: 10000 [IU] via SUBCUTANEOUS
  Filled 2016-09-18: qty 1

## 2016-09-18 MED ORDER — EPOETIN ALFA 10000 UNIT/ML IJ SOLN
30000.0000 [IU] | INTRAMUSCULAR | Status: DC
Start: 2016-09-18 — End: 2016-09-19

## 2016-09-28 ENCOUNTER — Telehealth (INDEPENDENT_AMBULATORY_CARE_PROVIDER_SITE_OTHER): Payer: Self-pay | Admitting: Orthopaedic Surgery

## 2016-09-28 NOTE — Telephone Encounter (Signed)
Leslie Coleman was calling asking about a fax that was sent over in feb about the home health care plan. Never received a signed form back. CB # [phone]

## 2016-09-29 NOTE — Telephone Encounter (Signed)
Called and she states she faxed it to [phone]. I gave her new fax number [phone]. She will refax.

## 2016-10-17 ENCOUNTER — Encounter (HOSPITAL_COMMUNITY)
Admission: RE | Admit: 2016-10-17 | Discharge: 2016-10-17 | Disposition: A | Discharge status: Home / Self Care | Payer: Medicare Other | Source: Ambulatory Visit | Attending: Internal Medicine | Admitting: Internal Medicine

## 2016-10-17 DIAGNOSIS — D649 Anemia, unspecified: Secondary | ICD-10-CM | POA: Diagnosis not present

## 2016-10-17 DIAGNOSIS — N189 Chronic kidney disease, unspecified: Secondary | ICD-10-CM

## 2016-10-17 DIAGNOSIS — D631 Anemia in chronic kidney disease: Secondary | ICD-10-CM

## 2016-10-17 LAB — POCT HEMOGLOBIN-HEMACUE: Hemoglobin: 10.3 g/dL — ABNORMAL LOW (ref 12.0–15.0)

## 2016-10-17 MED ORDER — EPOETIN ALFA 10000 UNIT/ML IJ SOLN
INTRAMUSCULAR | Status: AC
  Administered 2016-10-17: 10000 [IU] via SUBCUTANEOUS
  Filled 2016-10-17: qty 1

## 2016-10-17 MED ORDER — EPOETIN ALFA 20000 UNIT/ML IJ SOLN
INTRAMUSCULAR | Status: AC
  Administered 2016-10-17: 08:00:00 20000 [IU] via SUBCUTANEOUS
  Filled 2016-10-17: qty 1

## 2016-10-17 MED ORDER — EPOETIN ALFA 10000 UNIT/ML IJ SOLN: 30000.0000 [IU] | INTRAMUSCULAR | Status: DC

## 2016-10-19 ENCOUNTER — Encounter (HOSPITAL_COMMUNITY): Payer: Self-pay | Admitting: Emergency Medicine

## 2016-10-19 ENCOUNTER — Inpatient Hospital Stay (HOSPITAL_COMMUNITY)
Admission: EM | Admit: 2016-10-19 | Discharge: 2016-10-20 | DRG: 683 | Disposition: A | Discharge status: Home Health | Payer: Medicare Other | Attending: Internal Medicine | Admitting: Internal Medicine

## 2016-10-19 ENCOUNTER — Telehealth: Payer: Self-pay | Admitting: Nurse Practitioner

## 2016-10-19 ENCOUNTER — Emergency Department (HOSPITAL_COMMUNITY): Payer: Medicare Other

## 2016-10-19 DIAGNOSIS — N390 Urinary tract infection, site not specified: Secondary | ICD-10-CM | POA: Diagnosis not present

## 2016-10-19 DIAGNOSIS — B372 Candidiasis of skin and nail: Secondary | ICD-10-CM | POA: Diagnosis present

## 2016-10-19 DIAGNOSIS — N179 Acute kidney failure, unspecified: Secondary | ICD-10-CM | POA: Diagnosis not present

## 2016-10-19 DIAGNOSIS — N189 Chronic kidney disease, unspecified: Secondary | ICD-10-CM

## 2016-10-19 DIAGNOSIS — Z90710 Acquired absence of both cervix and uterus: Secondary | ICD-10-CM

## 2016-10-19 DIAGNOSIS — R8290 Unspecified abnormal findings in urine: Secondary | ICD-10-CM | POA: Diagnosis present

## 2016-10-19 DIAGNOSIS — E876 Hypokalemia: Secondary | ICD-10-CM | POA: Diagnosis present

## 2016-10-19 DIAGNOSIS — E785 Hyperlipidemia, unspecified: Secondary | ICD-10-CM | POA: Diagnosis present

## 2016-10-19 DIAGNOSIS — M109 Gout, unspecified: Secondary | ICD-10-CM | POA: Diagnosis present

## 2016-10-19 DIAGNOSIS — E1165 Type 2 diabetes mellitus with hyperglycemia: Secondary | ICD-10-CM | POA: Diagnosis present

## 2016-10-19 DIAGNOSIS — N3000 Acute cystitis without hematuria: Secondary | ICD-10-CM | POA: Diagnosis not present

## 2016-10-19 DIAGNOSIS — R55 Syncope and collapse: Secondary | ICD-10-CM | POA: Diagnosis not present

## 2016-10-19 DIAGNOSIS — L89159 Pressure ulcer of sacral region, unspecified stage: Secondary | ICD-10-CM | POA: Diagnosis present

## 2016-10-19 DIAGNOSIS — N184 Chronic kidney disease, stage 4 (severe): Secondary | ICD-10-CM | POA: Diagnosis not present

## 2016-10-19 DIAGNOSIS — E669 Obesity, unspecified: Secondary | ICD-10-CM | POA: Diagnosis present

## 2016-10-19 DIAGNOSIS — D631 Anemia in chronic kidney disease: Secondary | ICD-10-CM

## 2016-10-19 DIAGNOSIS — N393 Stress incontinence (female) (male): Secondary | ICD-10-CM | POA: Diagnosis present

## 2016-10-19 DIAGNOSIS — I472 Ventricular tachycardia, unspecified: Secondary | ICD-10-CM

## 2016-10-19 DIAGNOSIS — Z79899 Other long term (current) drug therapy: Secondary | ICD-10-CM

## 2016-10-19 DIAGNOSIS — D696 Thrombocytopenia, unspecified: Secondary | ICD-10-CM

## 2016-10-19 DIAGNOSIS — R918 Other nonspecific abnormal finding of lung field: Secondary | ICD-10-CM | POA: Diagnosis not present

## 2016-10-19 DIAGNOSIS — E039 Hypothyroidism, unspecified: Secondary | ICD-10-CM | POA: Diagnosis present

## 2016-10-19 DIAGNOSIS — Z8679 Personal history of other diseases of the circulatory system: Secondary | ICD-10-CM | POA: Diagnosis not present

## 2016-10-19 DIAGNOSIS — E118 Type 2 diabetes mellitus with unspecified complications: Secondary | ICD-10-CM

## 2016-10-19 DIAGNOSIS — L89151 Pressure ulcer of sacral region, stage 1: Secondary | ICD-10-CM | POA: Diagnosis not present

## 2016-10-19 DIAGNOSIS — R42 Dizziness and giddiness: Secondary | ICD-10-CM | POA: Diagnosis not present

## 2016-10-19 DIAGNOSIS — Z7984 Long term (current) use of oral hypoglycemic drugs: Secondary | ICD-10-CM | POA: Diagnosis not present

## 2016-10-19 DIAGNOSIS — I129 Hypertensive chronic kidney disease with stage 1 through stage 4 chronic kidney disease, or unspecified chronic kidney disease: Secondary | ICD-10-CM | POA: Diagnosis present

## 2016-10-19 DIAGNOSIS — IMO0002 Reserved for concepts with insufficient information to code with codable children: Secondary | ICD-10-CM | POA: Diagnosis present

## 2016-10-19 DIAGNOSIS — Z7982 Long term (current) use of aspirin: Secondary | ICD-10-CM | POA: Diagnosis not present

## 2016-10-19 DIAGNOSIS — E1122 Type 2 diabetes mellitus with diabetic chronic kidney disease: Secondary | ICD-10-CM | POA: Diagnosis present

## 2016-10-19 DIAGNOSIS — D649 Anemia, unspecified: Secondary | ICD-10-CM | POA: Diagnosis present

## 2016-10-19 DIAGNOSIS — Z8249 Family history of ischemic heart disease and other diseases of the circulatory system: Secondary | ICD-10-CM

## 2016-10-19 DIAGNOSIS — L8990 Pressure ulcer of unspecified site, unspecified stage: Secondary | ICD-10-CM | POA: Insufficient documentation

## 2016-10-19 DIAGNOSIS — R404 Transient alteration of awareness: Secondary | ICD-10-CM | POA: Diagnosis not present

## 2016-10-19 DIAGNOSIS — N183 Chronic kidney disease, stage 3 (moderate): Secondary | ICD-10-CM | POA: Diagnosis not present

## 2016-10-19 DIAGNOSIS — Z6828 Body mass index (BMI) 28.0-28.9, adult: Secondary | ICD-10-CM

## 2016-10-19 HISTORY — DX: Cardiac arrhythmia, unspecified: I49.9

## 2016-10-19 LAB — CBC WITH DIFFERENTIAL/PLATELET
BASOS ABS: 0 10*3/uL (ref 0.0–0.1)
Basophils Relative: 0 %
Eosinophils Absolute: 0 10*3/uL (ref 0.0–0.7)
Eosinophils Relative: 0 %
HCT: 32.4 % — ABNORMAL LOW (ref 36.0–46.0)
HEMOGLOBIN: 10.4 g/dL — AB (ref 12.0–15.0)
LYMPHS ABS: 1 10*3/uL (ref 0.7–4.0)
LYMPHS PCT: 17 %
MCH: 28.5 pg (ref 26.0–34.0)
MCHC: 32.1 g/dL (ref 30.0–36.0)
MCV: 88.8 fL (ref 78.0–100.0)
Monocytes Absolute: 0.4 10*3/uL (ref 0.1–1.0)
Monocytes Relative: 6 %
NEUTROS ABS: 4.8 10*3/uL (ref 1.7–7.7)
NEUTROS PCT: 77 %
Platelets: 142 10*3/uL — ABNORMAL LOW (ref 150–400)
RBC: 3.65 MIL/uL — AB (ref 3.87–5.11)
RDW: 14.4 % (ref 11.5–15.5)
WBC: 6.2 10*3/uL (ref 4.0–10.5)

## 2016-10-19 LAB — URINALYSIS, ROUTINE W REFLEX MICROSCOPIC
Bilirubin Urine: NEGATIVE
Glucose, UA: NEGATIVE mg/dL
Hgb urine dipstick: NEGATIVE
Ketones, ur: NEGATIVE mg/dL
Nitrite: NEGATIVE
PH: 7 (ref 5.0–8.0)
Protein, ur: 30 mg/dL — AB
SPECIFIC GRAVITY, URINE: 1.009 (ref 1.005–1.030)

## 2016-10-19 LAB — BASIC METABOLIC PANEL
Anion gap: 12 (ref 5–15)
BUN: 32 mg/dL — AB (ref 6–20)
CALCIUM: 9.6 mg/dL (ref 8.9–10.3)
CO2: 22 mmol/L (ref 22–32)
CREATININE: 2.31 mg/dL — AB (ref 0.44–1.00)
Chloride: 104 mmol/L (ref 101–111)
GFR calc Af Amer: 21 mL/min — ABNORMAL LOW (ref >60)
GFR, EST NON AFRICAN AMERICAN: 18 mL/min — AB (ref >60)
Glucose, Bld: 213 mg/dL — ABNORMAL HIGH (ref 65–99)
Potassium: 3.4 mmol/L — ABNORMAL LOW (ref 3.5–5.1)
Sodium: 138 mmol/L (ref 135–145)

## 2016-10-19 LAB — PHOSPHORUS: PHOSPHORUS: 3.2 mg/dL (ref 2.5–4.6)

## 2016-10-19 LAB — GLUCOSE, CAPILLARY
Glucose-Capillary: 116 mg/dL — ABNORMAL HIGH (ref 65–99)
Glucose-Capillary: 93 mg/dL (ref 65–99)

## 2016-10-19 LAB — I-STAT TROPONIN, ED
TROPONIN I, POC: 0.01 ng/mL (ref 0.00–0.08)
Troponin i, poc: 0 ng/mL (ref 0.00–0.08)

## 2016-10-19 LAB — TROPONIN I: Troponin I: <0.03 ng/mL (ref <0.03)

## 2016-10-19 LAB — TSH: TSH: 0.544 u[IU]/mL (ref 0.350–4.500)

## 2016-10-19 LAB — MAGNESIUM: MAGNESIUM: 1.5 mg/dL — AB (ref 1.7–2.4)

## 2016-10-19 MED ORDER — SODIUM CHLORIDE 0.9 % IV SOLN
INTRAVENOUS | Status: DC
  Administered 2016-10-19 – 2016-10-20 (×3): via INTRAVENOUS

## 2016-10-19 MED ORDER — SODIUM CHLORIDE 0.9 % IV BOLUS (SEPSIS)
1000.0000 mL | Freq: Once | INTRAVENOUS | Status: AC
  Administered 2016-10-19: 1000 mL via INTRAVENOUS

## 2016-10-19 MED ORDER — NYSTATIN 100000 UNIT/GM EX POWD
Freq: Three times a day (TID) | CUTANEOUS | Status: DC
  Administered 2016-10-19 – 2016-10-20 (×3): via TOPICAL
  Filled 2016-10-19: qty 15

## 2016-10-19 MED ORDER — HYDRALAZINE HCL 50 MG PO TABS
50.0000 mg | ORAL_TABLET | Freq: Two times a day (BID) | ORAL | Status: DC
  Administered 2016-10-19 – 2016-10-20 (×2): 50 mg via ORAL
  Filled 2016-10-19 (×2): qty 1

## 2016-10-19 MED ORDER — INSULIN ASPART 100 UNIT/ML ~~LOC~~ SOLN: 0.0000 [IU] | Freq: Every day | SUBCUTANEOUS | Status: DC

## 2016-10-19 MED ORDER — INSULIN ASPART 100 UNIT/ML ~~LOC~~ SOLN: 0.0000 [IU] | Freq: Three times a day (TID) | SUBCUTANEOUS | Status: DC

## 2016-10-19 MED ORDER — ACETAMINOPHEN 650 MG RE SUPP
650.0000 mg | Freq: Four times a day (QID) | RECTAL | Status: DC | PRN
Start: 2016-10-19 — End: 2016-10-20

## 2016-10-19 MED ORDER — PRAVASTATIN SODIUM 20 MG PO TABS
20.0000 mg | ORAL_TABLET | Freq: Every day | ORAL | Status: DC
  Administered 2016-10-19: 20 mg via ORAL
  Filled 2016-10-19: qty 1

## 2016-10-19 MED ORDER — LEVOTHYROXINE SODIUM 100 MCG PO TABS
100.0000 ug | ORAL_TABLET | Freq: Every day | ORAL | Status: DC
  Administered 2016-10-20: 100 ug via ORAL
  Filled 2016-10-19: qty 1

## 2016-10-19 MED ORDER — OMEGA-3-ACID ETHYL ESTERS 1 G PO CAPS
1.0000 g | ORAL_CAPSULE | Freq: Every day | ORAL | Status: DC
  Administered 2016-10-20: 1 g via ORAL
  Filled 2016-10-19: qty 1

## 2016-10-19 MED ORDER — ASPIRIN 81 MG PO TABS: 81.0000 mg | ORAL_TABLET | Freq: Every day | ORAL | Status: DC

## 2016-10-19 MED ORDER — TRAMADOL HCL 50 MG PO TABS: 50.0000 mg | ORAL_TABLET | Freq: Four times a day (QID) | ORAL | Status: DC | PRN

## 2016-10-19 MED ORDER — DEXTROSE 5 % IV SOLN
1.0000 g | INTRAVENOUS | Status: DC
  Administered 2016-10-20: 1 g via INTRAVENOUS
  Filled 2016-10-19: qty 10

## 2016-10-19 MED ORDER — DEXTROSE 5 % IV SOLN
1.0000 g | Freq: Once | INTRAVENOUS | Status: AC
  Administered 2016-10-19: 1 g via INTRAVENOUS
  Filled 2016-10-19: qty 10

## 2016-10-19 MED ORDER — METHOCARBAMOL 500 MG PO TABS: 500.0000 mg | ORAL_TABLET | Freq: Every day | ORAL | Status: DC | PRN

## 2016-10-19 MED ORDER — AMLODIPINE BESYLATE 5 MG PO TABS
5.0000 mg | ORAL_TABLET | Freq: Every day | ORAL | Status: DC
  Administered 2016-10-20: 5 mg via ORAL
  Filled 2016-10-19: qty 1

## 2016-10-19 MED ORDER — ONDANSETRON HCL 4 MG/2ML IJ SOLN
4.0000 mg | Freq: Once | INTRAMUSCULAR | Status: AC
  Administered 2016-10-19: 4 mg via INTRAVENOUS
  Filled 2016-10-19: qty 2

## 2016-10-19 MED ORDER — ACETAMINOPHEN 325 MG PO TABS: 650.0000 mg | ORAL_TABLET | Freq: Four times a day (QID) | ORAL | Status: DC | PRN

## 2016-10-19 MED ORDER — ENOXAPARIN SODIUM 30 MG/0.3ML ~~LOC~~ SOLN
30.0000 mg | SUBCUTANEOUS | Status: DC
  Administered 2016-10-19: 30 mg via SUBCUTANEOUS
  Filled 2016-10-19: qty 0.3

## 2016-10-19 MED ORDER — GLIPIZIDE ER 5 MG PO TB24
5.0000 mg | ORAL_TABLET | Freq: Every day | ORAL | Status: DC
  Administered 2016-10-20: 5 mg via ORAL
  Filled 2016-10-19: qty 1

## 2016-10-19 MED ORDER — ADULT MULTIVITAMIN W/MINERALS CH
1.0000 | ORAL_TABLET | Freq: Every day | ORAL | Status: DC
  Administered 2016-10-20: 1 via ORAL
  Filled 2016-10-19: qty 1

## 2016-10-19 MED ORDER — AMIODARONE HCL 100 MG PO TABS
100.0000 mg | ORAL_TABLET | Freq: Every morning | ORAL | Status: DC
Start: 2016-10-20 — End: 2016-10-20
  Administered 2016-10-20: 100 mg via ORAL
  Filled 2016-10-19: qty 1

## 2016-10-19 MED ORDER — ASPIRIN EC 81 MG PO TBEC
81.0000 mg | DELAYED_RELEASE_TABLET | Freq: Every day | ORAL | Status: DC
  Administered 2016-10-20: 81 mg via ORAL
  Filled 2016-10-19: qty 1

## 2016-10-19 NOTE — H&P (Signed)
History and Physical    [name] FAO:[id number] DOB: July 23, 1928 DOA: 10/19/2016   PCP: [name], MD   Patient coming from/Resides with: Private residence/husband  Admission status: Inpatient/telemetry -medically necessary to stay a minimum 2 midnights to rule out impending and/or unexpected changes in physiologic status that may differ from initial evaluation performed in the ER and/or at time of admission. Presents with dizziness without syncopal episode in setting of apparent dehydration/acute kidney injury and possible UTI. Patient will require inpatient IV fluids, monitoring of telemetry to rule out arrhythmia etiology, IV antibiotics to cover for possible UTI, inpatient echocardiogram and PT/OT evaluation regarding stability of mobilization during the next 24 hours.  Chief Complaint: Dizziness  HPI: Leslie Coleman is a 83 y.o. female with medical history significant for diabetes on oral agents, chronic kidney disease stage IV, remote history of ventricular tachycardia on amiodarone, hypothyroidism, anemia and chronic kidney disease on Procrit, and hospitalization in January 2018 for operative intervention for right tibial plateau fracture. Patient reports that early this morning while she was sitting beside the bed she became quite dizzy and was concerned this could be "heart related". She notified her daughter who brought her to the ER for further evaluation. She's not had any syncopal episodes. She has not had any awareness of palpitations or any chest pain. She reports she is tracking fluid adequately but states she has had poor appetite and poor food intake. The daughter reports that the patient and her husband's home is very warm although she states that her parents heal cold all the time. In the ER patient had evidence of abnormal urinalysis concerning for UTI and acute kidney injury and concerns for possible dehydration.  ED Course:  Vital Signs: BP 134/67   Pulse 69    Temp 97.9 F (36.6 C)   Resp 18   Ht 5\' 2"  (1.575 m)   Wt 68.5 kg (151 lb)   SpO2 95%   BMI 27.62 kg/m  2 view CXR: Neg Lab data: Sodium 138, potassium 3.4, chloride 104, CO2 22, glucose 213, BUN 32, creatinine 2.31, poc trop 0.00, white count 6200 with normal differential, hemoglobin 10.1, platelets 142,000, urinalysis abnormal with rare bacteria, small leukocytes, negative nitrite, WBCs 6-30 Medications and treatments: Normal saline bolus 1 L, Zofran 4 mg IV 1  Review of Systems:  In addition to the HPI above,  No Fever-chills, myalgias or other constitutional symptoms No Headache, changes with Vision or hearing, new weakness, tingling, numbness in any extremity, dysarthria or word finding difficulty,  tremors or seizure activity No problems swallowing food or Liquids, indigestion/reflux, choking or coughing while eating, abdominal pain with or after eating No Chest pain, Cough or Shortness of Breath, palpitations, orthopnea or DOE No Abdominal pain, N/V, melena,hematochezia, dark tarry stools, constipation No dysuria, malodorous urine, hematuria or flank pain No new lesions, masses or bruises, No new joint pains, aches, swelling or redness No recent unintentional weight gain or loss No polyuria, polydypsia or polyphagia   Past Medical History:  Diagnosis Date  . Abnormal PFTs (pulmonary function tests) 2008   No obstruction noted. Has been followed clinically.  . Anemia    treated with Procrit  . Fall from slip, trip, or stumble 05/07/2016   [place] resulting in right [place] plateau fracture  . Gout   . High risk medication use    on amiodarone.  Marland Kitchen HTN (hypertension)    Last echo in 2004; EF normal  . Hyperlipidemia   .  Hypothyroidism    on replacement  . Obesity   . Type II diabetes mellitus ([place])   . V-tach [facility])    remote VT, treated initially with procainamide, on amiodarone since 2008    Past Surgical History:  Procedure Laterality Date  .  CARDIOVASCULAR STRESS TEST  07/07/2003   EF 70%. NO EVIDENCE OF ISCHEMIA  . CARDIOVERSION  1978  . CATARACT EXTRACTION, BILATERAL Bilateral   . CESAREAN SECTION     x 3  . ORIF TIBIA PLATEAU Right 05/10/2016   Procedure: OPEN REDUCTION INTERNAL FIXATION (ORIF) RIGHT TIBIAL PLATEAU  WITH BONE GRAFT;  Surgeon: Leandrew Koyanagi, MD;  Location: [facility];  Service: Orthopedics;  Laterality: Right;  . US ECHOCARDIOGRAPHY  12/29/2002   EF 60-65%  . VAGINAL HYSTERECTOMY      Social History   Social History  . Marital status: Married    Spouse name: N/A  . Number of children: N/A  . Years of education: N/A   Occupational History  . Not on file.   Social History Main Topics  . Smoking status: Never Smoker  . Smokeless tobacco: Never Used  . Alcohol use No  . Drug use: No  . Sexual activity: Not Currently   Other Topics Concern  . Not on file   Social History Narrative   Lives with spouse in [place]    Mobility: Rolling walker Work history: Not obtained   No Known Allergies  Family History  Problem Relation Age of Onset  . Heart disease Mother   . Heart disease Father      Prior to Admission medications   Medication Sig Start Date End Date Taking? Authorizing Provider  amiodarone (PACERONE) 100 MG tablet Take 1 tablet (100 mg total) by mouth every morning. 08/04/16   Burtis Junes, NP  amLODipine (NORVASC) 5 MG tablet Take 1 tablet (5 mg total) by mouth daily. 07/25/16 10/23/16  Burtis Junes, NP  aspirin 81 MG tablet Take 1 tablet (81 mg total) by mouth daily. 07/25/16   Burtis Junes, NP  Epoetin Alfa (PROCRIT IJ) Inject 10,000 Units as directed every 28 (twenty-eight) days. As directed every 4 weeks.    [provider]  furosemide (LASIX) 40 MG tablet Take 1 tablet (40 mg total) by mouth daily. 08/04/16   Burtis Junes, NP  glipiZIDE (GLUCOTROL XL) 5 MG 24 hr tablet Take 5 mg by mouth daily. 04/03/16   [provider]  hydrALAZINE (APRESOLINE) 50 MG  tablet TAKE 1 TABLET BY MOUTH TWICE A DAY 04/24/16   Allred, Jeneen Rinks, MD  levothyroxine (SYNTHROID, LEVOTHROID) 100 MCG tablet Take 1 tablet (100 mcg total) by mouth daily before breakfast. 07/25/16   Burtis Junes, NP  Melatonin 5 MG CAPS Take 5 mg by mouth daily.    [provider]  methocarbamol (ROBAXIN) 500 MG tablet Take 500 mg by mouth daily as needed for muscle spasms.    [provider]  Multiple Vitamin (MULTIVITAMIN WITH MINERALS) TABS tablet Take 1 tablet by mouth daily.    [provider]  omega-3 acid ethyl esters (LOVAZA) 1 g capsule TAKE 2 CAPSULES BY MOUTH 2 TIMES DAILY. 06/30/16   Burtis Junes, NP  potassium chloride (K-DUR) 10 MEQ tablet Take 10 mEq by mouth every morning.     [provider]  pravastatin (PRAVACHOL) 20 MG tablet TAKE 1 TABLET BY MOUTH AT BEDTIME 06/12/16   Burtis Junes, NP  telmisartan (MICARDIS) 80 MG tablet  Take 1 tablet (80 mg total) by mouth daily. 07/25/16   Burtis Junes, NP  traMADol (ULTRAM) 50 MG tablet Take 50 mg by mouth every 6 (six) hours as needed for moderate pain.    [provider]    Physical Exam: Vitals:   10/19/16 1345 10/19/16 1415 10/19/16 1445 10/19/16 1515  BP: (!) 160/93 125/68 139/84 134/67  Pulse:  66 69 69  Resp: 16 16 20 18   Temp:      TempSrc:      SpO2: 99% 98% 97% 95%  Weight:      Height:          Constitutional: NAD, calm, comfortable Eyes: PERRL, lids and conjunctivae normal ENMT: Mucous membranes are moist. Posterior pharynx clear of any exudate or lesions.Normal dentition.  Neck: normal, supple, no masses, no thyromegaly Respiratory: clear to auscultation bilaterally, no wheezing, no crackles. Normal respiratory effort. No accessory muscle use.  Cardiovascular: Regular rate and rhythm, no murmurs / rubs / gallops. No extremity edema. 2+ pedal pulses. No carotid bruits. Is not orthostatic when checked Abdomen: no tenderness, no masses palpated. No  hepatosplenomegaly. Bowel sounds positive.  Genitourinary: During orthostatic vital sign checks patient demonstrated stress incontinence with standing Musculoskeletal: no clubbing / cyanosis. No joint deformity upper and lower extremities. Good ROM, no contractures. Normal muscle tone.  Skin: no rashes, lesions, ulcers. No induration Neurologic: CN 2-12 grossly intact. Sensation intact, DTR normal. Strength 5/5 x all 4 extremities.  Psychiatric: Alert and oriented x 3. Normal somewhat anxious mood. Demonstrates impulsivity in behavior.   Labs on Admission: I have personally reviewed following labs and imaging studies  CBC:  Recent Labs Lab 10/17/16 0815 10/19/16 1253  WBC  --  6.2  NEUTROABS  --  4.8  HGB 10.3* 10.4*  HCT  --  32.4*  MCV  --  88.8  PLT  --  098*   Basic Metabolic Panel:  Recent Labs Lab 10/19/16 1253  NA 138  K 3.4*  CL 104  CO2 22  GLUCOSE 213*  BUN 32*  CREATININE 2.31*  CALCIUM 9.6   GFR: Estimated Creatinine Clearance: 15.9 mL/min (A) (by C-G formula based on SCr of 2.31 mg/dL (H)). Liver Function Tests: No results for input(s): AST, ALT, ALKPHOS, BILITOT, PROT, ALBUMIN in the last 168 hours. No results for input(s): LIPASE, AMYLASE in the last 168 hours. No results for input(s): AMMONIA in the last 168 hours. Coagulation Profile: No results for input(s): INR, PROTIME in the last 168 hours. Cardiac Enzymes: No results for input(s): CKTOTAL, CKMB, CKMBINDEX, TROPONINI in the last 168 hours. BNP (last 3 results) No results for input(s): PROBNP in the last 8760 hours. HbA1C: No results for input(s): HGBA1C in the last 72 hours. CBG: No results for input(s): GLUCAP in the last 168 hours. Lipid Profile: No results for input(s): CHOL, HDL, LDLCALC, TRIG, CHOLHDL, LDLDIRECT in the last 72 hours. Thyroid Function Tests: No results for input(s): TSH, T4TOTAL, FREET4, T3FREE, THYROIDAB in the last 72 hours. Anemia Panel: No results for input(s):  VITAMINB12, FOLATE, FERRITIN, TIBC, IRON, RETICCTPCT in the last 72 hours. Urine analysis:    Component Value Date/Time   COLORURINE YELLOW 10/19/2016 [place] 10/19/2016 1253   LABSPEC 1.009 10/19/2016 1253   PHURINE 7.0 10/19/2016 [place] 10/19/2016 1253   [place] 10/19/2016 [place] 10/19/2016 [place] 10/19/2016 1253   PROTEINUR 30 (A) 10/19/2016 1253  UROBILINOGEN 0.2 07/06/2011 2116   NITRITE NEGATIVE 10/19/2016 1253   LEUKOCYTESUR SMALL (A) 10/19/2016 1253   Sepsis Labs: @LABRCNTIP (procalcitonin:4,lacticidven:4) )No results found for this or any previous visit (from the past 240 hour(s)).   Radiological Exams on Admission: Dg Chest 2 View  Result Date: 10/19/2016 CLINICAL DATA:  Intermittent dizziness since 4:30 this morning. EXAM: CHEST  2 VIEW COMPARISON:  09/29/2015. FINDINGS: Stable borderline enlarged cardiac silhouette and tortuous and calcified thoracic aorta. Clear lungs with normal vascularity. Mild diffuse peribronchial thickening without significant change. Diffuse osteopenia. Thoracic spine degenerative changes. IMPRESSION: No acute abnormality.  Stable mild chronic bronchitic changes. Electronically Signed   By: Claudie Revering M.D.   On: 10/19/2016 13:32    EKG: (Independently reviewed) Sinus rhythm with ventricular rate 65 bpm, QTC 480 ms, chronic J-point elevation in inferolateral leads unchanged from previous EKG, no ischemic changes  Assessment/Plan Principal Problem:   Acute kidney injury on CKD (chronic kidney disease) stage 4, GFR 15-29 ml/min  -Patient presents with dizziness with evidence of acute kidney injury on labs -Baseline: 28 and 1.73 -Current: 32 and 2.31 -Hold ARB and Lasix -Gentle IV fluid hydration -Follow labs  Active Problems:   Dizziness -Seems to have a postural component according to history given by patient but current orthostatic vital signs within normal  limits -Holding diuretic as above and hydrating -PT/OT evaluation -Could be atypical presentation of CAD so cycle troponin and check echocardiogram (+ FH CAD) -Telemetry monitoring in the event is related to arrhythmia    Hypothyroidism, adult -Continue Synthroid -TSH on 07/25/16 was 11.11 with repeat on 08/08/16.39 -TSH this admission    Abnormal urinalysis -Concerning for UTI -Follow up on culture  -Empiric Rocephin -Possibly more reflective of dehydration state    Diabetes mellitus type 2, uncontrolled  -Current CBG greater than 200 -Patient reports controlled at home without hypoglycemia -Continue glipizide -Follow CBGs -SSI -HgbA1c    V-tach  -Remote and followed outpatient by cardiology -Continue amiodarone -Does have abnormal PFTs which have remained stable according to outpatient documentation    Anemia due to chronic kidney disease -Hemoglobin stable at 10.4 -On Procrit every 28 days    Thrombocytopenia  -Chronic recurrent issue and platelets typically remain greater than 100,000 -Follow labs    Candidal dermatitis -Primarily in the inguinal areas -Topical nystatin powder    Stress incontinence -Chronic issue -Seems to occur with patient standing and this could be a safety issue at home -In the past has utilized [employer] undergarments -May need to consider low-dose pharmacotherapy-likely not a candidate for surgical repair      DVT prophylaxis: Dose adjusted Lovenox for low GFR Code Status: Full Family Communication: Daughter  Disposition Plan: Home-may require home health services pending PT/OT evaluation Consults called: None     ELLIS,ALLISON L. ANP-BC [facility] Pager [phone]   If 7PM-7AM, please contact night-coverage [email or web address] Password [facility]  10/19/2016, 3:19 PM

## 2016-10-19 NOTE — Telephone Encounter (Signed)
Received call directly from operator from patient's daughter, Caren Griffins, who states patient called her and reported that she got very dizzy and almost passed out.  Daughter states she does not have any more details since she is not with the patient but patient requested to see Truitt Merle, NP today. I advised Caren Griffins that with the patient's history of VT that patient should go ahead to the [facility]. Daughter states she is going to call 911 to transport the patient. She thanked me for my help.

## 2016-10-19 NOTE — ED Triage Notes (Signed)
Pt arrives to ED with intermittent dizziness started at 0430 when got out of bed walking to restroom. Pt states at this time has resolved. Pt is alert and ox4. No other neuro deficits reported. History of vertigo

## 2016-10-19 NOTE — ED Provider Notes (Signed)
[facility] DEPT Provider Note   CSN: [id number] Arrival date & time: 10/19/16  1111     History   Chief Complaint Chief Complaint  Patient presents with  . Dizziness    HPI Leslie Coleman is a 83 y.o. female.  Per report by the patient and family member, patient was asleep last night. Went from position of lying to standing in order to go to the bathroom. Felt lightheaded. Denies any other symptoms such as chest pain, shortness of breath. Does mention having a mild headache. Took an Aleve. Resolution of symptoms after laying down again. Went back to sleep. Woke up before breakfast. Went to use the restroom again. Had another episode of lightheadedness. One episode of emesis. Nonbloody. Again denied any chest pain, short of breath, palpitations, headache, vision changes. She laid down again and had resolution of symptoms again. Called EMS. Per report, patient was hypertensive with them. Brought here for further evaluation. She denies any fevers or chills. She does endorse some urinary frequency, no dysuria. Denies any hematochezia or melena. Denies any changes in her medications. Per the daughter, the patient is at her baseline mental status.   The history is provided by the patient and a relative.  Illness  This is a new problem. The current episode started 6 to 12 hours ago. Episode frequency: intermittently. Progression since onset: intermittent. Associated symptoms include headaches (resolved). Pertinent negatives include no chest pain, no abdominal pain and no shortness of breath. Relieved by: resting.    Past Medical History:  Diagnosis Date  . Abnormal PFTs (pulmonary function tests) 2008   No obstruction noted. Has been followed clinically.  . Anemia    treated with Procrit  . Fall from slip, trip, or stumble 05/07/2016   [place] resulting in right tibial plateau fracture  . Gout   . High risk medication use    on amiodarone.  Marland Kitchen HTN (hypertension)    Last echo  in 2004; EF normal  . Hyperlipidemia   . Hypothyroidism    on replacement  . Obesity   . Type II diabetes mellitus ([place])   . V-tach [facility])    remote VT, treated initially with procainamide, on amiodarone since 2008    Patient Active Problem List   Diagnosis Date Noted  . Acute kidney injury ([place]) 10/19/2016  . Dizziness 10/19/2016  . CKD (chronic kidney disease) stage 4, GFR 15-29 ml/min (HCC) 10/19/2016  . Hypothyroidism, adult 10/19/2016  . Abnormal urinalysis 10/19/2016  . Diabetes mellitus type 2, uncontrolled ([place]) 10/19/2016  . Anemia due to chronic kidney disease 10/19/2016  . Thrombocytopenia ([place]) 10/19/2016  . Candidal dermatitis 10/19/2016  . Stress incontinence 10/19/2016  . V-tach ([place]) 10/19/2016  . CKD (chronic kidney disease), stage III 05/20/2016  . Hypothyroidism 05/20/2016  . Closed fracture of right tibial plateau 05/12/2016  . Anemia in chronic kidney disease 12/16/2015  . Dizzy 07/07/2011  . Dehydration 07/07/2011  . Type II diabetes mellitus ([place]) 07/06/2011  . History of long-term treatment with high-risk medication 08/29/2010  . Abnormal PFTs (pulmonary function tests)   . History of ventricular tachycardia   . HTN (hypertension)   . Hyperlipidemia   . Gout     Past Surgical History:  Procedure Laterality Date  . CARDIOVASCULAR STRESS TEST  07/07/2003   EF 70%. NO EVIDENCE OF ISCHEMIA  . CARDIOVERSION  1978  . CATARACT EXTRACTION, BILATERAL Bilateral   . CESAREAN SECTION     x 3  . ORIF TIBIA  PLATEAU Right 05/10/2016   Procedure: OPEN REDUCTION INTERNAL FIXATION (ORIF) RIGHT TIBIAL PLATEAU  WITH BONE GRAFT;  Surgeon: Leandrew Koyanagi, MD;  Location: [place];  Service: Orthopedics;  Laterality: Right;  . US ECHOCARDIOGRAPHY  12/29/2002   EF 60-65%  . VAGINAL HYSTERECTOMY      OB History    No data available       Home Medications    Prior to Admission medications   Medication Sig Start Date End Date Taking? Authorizing Provider  amiodarone  (PACERONE) 100 MG tablet Take 1 tablet (100 mg total) by mouth every morning. 08/04/16   Burtis Junes, NP  amLODipine (NORVASC) 5 MG tablet Take 1 tablet (5 mg total) by mouth daily. 07/25/16 10/23/16  Burtis Junes, NP  aspirin 81 MG tablet Take 1 tablet (81 mg total) by mouth daily. 07/25/16   Burtis Junes, NP  Epoetin Alfa (PROCRIT IJ) Inject 10,000 Units as directed every 28 (twenty-eight) days. As directed every 4 weeks.    [provider]  furosemide (LASIX) 40 MG tablet Take 1 tablet (40 mg total) by mouth daily. 08/04/16   Burtis Junes, NP  glipiZIDE (GLUCOTROL XL) 5 MG 24 hr tablet Take 5 mg by mouth daily. 04/03/16   [provider]  hydrALAZINE (APRESOLINE) 50 MG tablet TAKE 1 TABLET BY MOUTH TWICE A DAY 04/24/16   Allred, Jeneen Rinks, MD  levothyroxine (SYNTHROID, LEVOTHROID) 100 MCG tablet Take 1 tablet (100 mcg total) by mouth daily before breakfast. 07/25/16   Burtis Junes, NP  Melatonin 5 MG CAPS Take 5 mg by mouth daily.    [provider]  methocarbamol (ROBAXIN) 500 MG tablet Take 500 mg by mouth daily as needed for muscle spasms.    [provider]  Multiple Vitamin (MULTIVITAMIN WITH MINERALS) TABS tablet Take 1 tablet by mouth daily.    [provider]  omega-3 acid ethyl esters (LOVAZA) 1 g capsule TAKE 2 CAPSULES BY MOUTH 2 TIMES DAILY. 06/30/16   Burtis Junes, NP  potassium chloride (K-DUR) 10 MEQ tablet Take 10 mEq by mouth every morning.     [provider]  pravastatin (PRAVACHOL) 20 MG tablet TAKE 1 TABLET BY MOUTH AT BEDTIME 06/12/16   Burtis Junes, NP  telmisartan (MICARDIS) 80 MG tablet Take 1 tablet (80 mg total) by mouth daily. 07/25/16   Burtis Junes, NP  traMADol (ULTRAM) 50 MG tablet Take 50 mg by mouth every 6 (six) hours as needed for moderate pain.    [provider]    Family History Family History  Problem Relation Age of Onset  . Heart disease Mother   . Heart disease Father      Social History Social History  Substance Use Topics  . Smoking status: Never Smoker  . Smokeless tobacco: Never Used  . Alcohol use No     Allergies   Patient has no known allergies.   Review of Systems Review of Systems  Constitutional: Negative for chills and fever.  Respiratory: Negative for shortness of breath.   Cardiovascular: Negative for chest pain, palpitations and leg swelling.  Gastrointestinal: Positive for vomiting. Negative for abdominal pain, blood in stool and diarrhea.  Genitourinary: Positive for frequency. Negative for dysuria.  Skin: Negative for rash.  Neurological: Positive for light-headedness (resolved) and headaches (resolved). Negative for facial asymmetry, speech difficulty, weakness and numbness.       No vertigo  All other systems reviewed and are negative.  Physical Exam Updated Vital Signs BP 134/67   Pulse 69   Temp 97.9 F (36.6 C)   Resp 18   Ht 5\' 2"  (1.575 m)   Wt 68.5 kg (151 lb)   SpO2 95%   BMI 27.62 kg/m   Physical Exam  Constitutional: She appears well-developed and well-nourished. No distress.  HENT:  Head: Normocephalic and atraumatic.  Eyes: Conjunctivae are normal.  Neck: Neck supple.  Cardiovascular: Normal rate and regular rhythm.   No murmur heard. Pulmonary/Chest: Effort normal and breath sounds normal. No respiratory distress.  Abdominal: Soft. There is no tenderness.  Musculoskeletal: She exhibits no edema.       Right lower leg: She exhibits no swelling and no edema.       Left lower leg: She exhibits no swelling and no edema.  Neurological: She is alert. She has normal strength. No cranial nerve deficit or sensory deficit. She displays a negative Romberg sign. Coordination normal. GCS eye subscore is 4. GCS verbal subscore is 5. GCS motor subscore is 6.  Skin: Skin is warm and dry.  Psychiatric: She has a normal mood and affect.  Nursing note and vitals reviewed.    ED Treatments / Results   Labs (all labs ordered are listed, but only abnormal results are displayed) Labs Reviewed  BASIC METABOLIC PANEL - Abnormal; Notable for the following:       Result Value   Potassium 3.4 (*)    Glucose, Bld 213 (*)    BUN 32 (*)    Creatinine, Ser 2.31 (*)    GFR calc non Af Amer 18 (*)    GFR calc Af Amer 21 (*)    All other components within normal limits  URINALYSIS, ROUTINE W REFLEX MICROSCOPIC - Abnormal; Notable for the following:    Protein, ur 30 (*)    Leukocytes, UA SMALL (*)    Bacteria, UA RARE (*)    Squamous Epithelial / LPF 0-5 (*)    All other components within normal limits  CBC WITH DIFFERENTIAL/PLATELET - Abnormal; Notable for the following:    RBC 3.65 (*)    Hemoglobin 10.4 (*)    HCT 32.4 (*)    Platelets 142 (*)    All other components within normal limits  URINE CULTURE  CULTURE, BLOOD (ROUTINE X 2)  CULTURE, BLOOD (ROUTINE X 2)  TSH  HEMOGLOBIN A1C  TROPONIN I  TROPONIN I  TROPONIN I  I-STAT TROPOININ, ED  I-STAT TROPOININ, ED    EKG  EKG Interpretation  Date/Time:  Thursday Oct 19 2016 11:30:23 EDT Ventricular Rate:  65 PR Interval:  202 QRS Duration: 86 QT Interval:  462 QTC Calculation: 480 R Axis:   49 Text Interpretation:  Normal sinus rhythm Possible Anterior infarct , age undetermined Abnormal ECG No significant change since last tracing Confirmed by [facility] MD, [place] ([id number]) on 10/19/2016 12:01:48 PM       Radiology Dg Chest 2 View  Result Date: 10/19/2016 CLINICAL DATA:  Intermittent dizziness since 4:30 this morning. EXAM: CHEST  2 VIEW COMPARISON:  09/29/2015. FINDINGS: Stable borderline enlarged cardiac silhouette and tortuous and calcified thoracic aorta. Clear lungs with normal vascularity. Mild diffuse peribronchial thickening without significant change. Diffuse osteopenia. Thoracic spine degenerative changes. IMPRESSION: No acute abnormality.  Stable mild chronic bronchitic changes. Electronically Signed   By: Claudie Revering M.D.   On: 10/19/2016 13:32    Procedures Procedures (including critical care time)  Medications Ordered in ED Medications  cefTRIAXone (  ROCEPHIN) 1 g in dextrose 5 % 50 mL IVPB (1 g Intravenous New Bag/Given 10/19/16 1540)  0.9 %  sodium chloride infusion ( Intravenous New Bag/Given 10/19/16 1540)  insulin aspart (novoLOG) injection 0-9 Units (not administered)  insulin aspart (novoLOG) injection 0-5 Units (not administered)  sodium chloride 0.9 % bolus 1,000 mL (0 mLs Intravenous Stopped 10/19/16 1542)  ondansetron (ZOFRAN) injection 4 mg (4 mg Intravenous Given 10/19/16 1345)     Initial Impression / Assessment and Plan / ED Course  I have reviewed the triage vital signs and the nursing notes.  Pertinent labs & imaging results that were available during my care of the patient were reviewed by me and considered in my medical decision making (see chart for details).     Patient a couple episodes of near syncope overnight. Disruption of symptoms sounds likely orthostatic in nature. Vital signs normal and stable here. EKG without ischemic changes or interval abnormalities. Troponin negative. Doubt ACS. Patient has evidence of an acute kidney injury. Given the patient IV fluids here. Urinalysis with question of UTI. Starting on antibiotics. In the setting of an older female with an infection and acute kidney injury, we'll limit the patient to the hospital overnight for observation.  Final Clinical Impressions(s) / ED Diagnoses   Final diagnoses:  Lightheadedness  Urinary tract infection without hematuria, site unspecified  AKI (acute kidney injury) ([place])    New Prescriptions New Prescriptions   No medications on file     Maryan Puls, MD 10/19/16 [place], [place], MD 10/23/16 [phone]

## 2016-10-19 NOTE — Progress Notes (Addendum)
New Admission Note:   Arrival Method: stretcher from ED  Mental Orientation: alert and oriented x 4 Telemetry: box 6 and continuous pulse ox  Assessment: Completed Skin: see flow sheet  IV: left arm  Pain: denies  Tubes: none  Safety Measures: Safety Fall Prevention Plan has been given, discussed and signed Admission: pending  [facility] Orientation: Patient has been orientated to the room, unit and staff.  Family: daughter at bedside   Orders have been reviewed and implemented. Will continue to monitor the patient. Call light has been placed within reach and bed alarm has been activated.   Emilio Math, RN [facility]  Phone number: [phone]

## 2016-10-19 NOTE — ED Notes (Signed)
Family at bedside. 

## 2016-10-19 NOTE — Telephone Encounter (Signed)
New Message    Daughter called  Pt states she is very dizzy and almost fell, think its her heart

## 2016-10-20 ENCOUNTER — Inpatient Hospital Stay (HOSPITAL_BASED_OUTPATIENT_CLINIC_OR_DEPARTMENT_OTHER): Payer: Medicare Other

## 2016-10-20 DIAGNOSIS — N3000 Acute cystitis without hematuria: Secondary | ICD-10-CM | POA: Diagnosis not present

## 2016-10-20 DIAGNOSIS — L89151 Pressure ulcer of sacral region, stage 1: Secondary | ICD-10-CM

## 2016-10-20 DIAGNOSIS — R42 Dizziness and giddiness: Secondary | ICD-10-CM | POA: Diagnosis not present

## 2016-10-20 DIAGNOSIS — N184 Chronic kidney disease, stage 4 (severe): Secondary | ICD-10-CM | POA: Diagnosis not present

## 2016-10-20 DIAGNOSIS — N183 Chronic kidney disease, stage 3 (moderate): Secondary | ICD-10-CM | POA: Diagnosis not present

## 2016-10-20 DIAGNOSIS — E039 Hypothyroidism, unspecified: Secondary | ICD-10-CM | POA: Diagnosis not present

## 2016-10-20 DIAGNOSIS — E1165 Type 2 diabetes mellitus with hyperglycemia: Secondary | ICD-10-CM

## 2016-10-20 DIAGNOSIS — E1122 Type 2 diabetes mellitus with diabetic chronic kidney disease: Secondary | ICD-10-CM | POA: Diagnosis not present

## 2016-10-20 DIAGNOSIS — R55 Syncope and collapse: Secondary | ICD-10-CM | POA: Diagnosis not present

## 2016-10-20 DIAGNOSIS — N179 Acute kidney failure, unspecified: Secondary | ICD-10-CM | POA: Diagnosis not present

## 2016-10-20 DIAGNOSIS — B372 Candidiasis of skin and nail: Secondary | ICD-10-CM

## 2016-10-20 LAB — GLUCOSE, CAPILLARY
GLUCOSE-CAPILLARY: 129 mg/dL — AB (ref 65–99)
Glucose-Capillary: 102 mg/dL — ABNORMAL HIGH (ref 65–99)

## 2016-10-20 LAB — BLOOD CULTURE ID PANEL (REFLEXED)
Acinetobacter baumannii: NOT DETECTED
CANDIDA GLABRATA: NOT DETECTED
CANDIDA KRUSEI: NOT DETECTED
Candida albicans: NOT DETECTED
Candida parapsilosis: NOT DETECTED
Candida tropicalis: NOT DETECTED
ENTEROBACTER CLOACAE COMPLEX: NOT DETECTED
ENTEROCOCCUS SPECIES: NOT DETECTED
ESCHERICHIA COLI: NOT DETECTED
Enterobacteriaceae species: NOT DETECTED
Haemophilus influenzae: NOT DETECTED
Klebsiella oxytoca: NOT DETECTED
Klebsiella pneumoniae: NOT DETECTED
Listeria monocytogenes: NOT DETECTED
Methicillin resistance: DETECTED — AB
Neisseria meningitidis: NOT DETECTED
PROTEUS SPECIES: NOT DETECTED
PSEUDOMONAS AERUGINOSA: NOT DETECTED
STAPHYLOCOCCUS SPECIES: DETECTED — AB
STREPTOCOCCUS PNEUMONIAE: NOT DETECTED
Serratia marcescens: NOT DETECTED
Staphylococcus aureus (BCID): NOT DETECTED
Streptococcus agalactiae: NOT DETECTED
Streptococcus pyogenes: NOT DETECTED
Streptococcus species: NOT DETECTED

## 2016-10-20 LAB — HEMOGLOBIN A1C
HEMOGLOBIN A1C: 6 % — AB (ref 4.8–5.6)
MEAN PLASMA GLUCOSE: 126 mg/dL

## 2016-10-20 LAB — COMPREHENSIVE METABOLIC PANEL
ALK PHOS: 61 U/L (ref 38–126)
ALT: 12 U/L — ABNORMAL LOW (ref 14–54)
ANION GAP: 9 (ref 5–15)
AST: 20 U/L (ref 15–41)
Albumin: 2.6 g/dL — ABNORMAL LOW (ref 3.5–5.0)
BUN: 28 mg/dL — ABNORMAL HIGH (ref 6–20)
CALCIUM: 8.8 mg/dL — AB (ref 8.9–10.3)
CHLORIDE: 107 mmol/L (ref 101–111)
CO2: 24 mmol/L (ref 22–32)
Creatinine, Ser: 2.16 mg/dL — ABNORMAL HIGH (ref 0.44–1.00)
GFR calc non Af Amer: 20 mL/min — ABNORMAL LOW (ref >60)
GFR, EST AFRICAN AMERICAN: 23 mL/min — AB (ref >60)
GLUCOSE: 84 mg/dL (ref 65–99)
POTASSIUM: 3.2 mmol/L — AB (ref 3.5–5.1)
SODIUM: 140 mmol/L (ref 135–145)
Total Bilirubin: 0.6 mg/dL (ref 0.3–1.2)
Total Protein: 5.8 g/dL — ABNORMAL LOW (ref 6.5–8.1)

## 2016-10-20 LAB — CBC
HEMATOCRIT: 28.9 % — AB (ref 36.0–46.0)
HEMOGLOBIN: 9.3 g/dL — AB (ref 12.0–15.0)
MCH: 28.9 pg (ref 26.0–34.0)
MCHC: 32.2 g/dL (ref 30.0–36.0)
MCV: 89.8 fL (ref 78.0–100.0)
Platelets: 142 10*3/uL — ABNORMAL LOW (ref 150–400)
RBC: 3.22 MIL/uL — AB (ref 3.87–5.11)
RDW: 14.6 % (ref 11.5–15.5)
WBC: 5.7 10*3/uL (ref 4.0–10.5)

## 2016-10-20 LAB — TROPONIN I

## 2016-10-20 LAB — ECHOCARDIOGRAM COMPLETE
Height: 62 in
WEIGHTICAEL: 2476.8 [oz_av]

## 2016-10-20 MED ORDER — SULFAMETHOXAZOLE-TRIMETHOPRIM 800-160 MG PO TABS: 1.0000 | ORAL_TABLET | Freq: Two times a day (BID) | ORAL | 0 refills | Status: DC

## 2016-10-20 MED ORDER — MAGNESIUM SULFATE 2 GM/50ML IV SOLN
2.0000 g | Freq: Once | INTRAVENOUS | Status: AC
  Administered 2016-10-20: 2 g via INTRAVENOUS
  Filled 2016-10-20: qty 50

## 2016-10-20 MED ORDER — POTASSIUM CHLORIDE CRYS ER 20 MEQ PO TBCR
40.0000 meq | EXTENDED_RELEASE_TABLET | Freq: Once | ORAL | Status: AC
  Administered 2016-10-20: 40 meq via ORAL
  Filled 2016-10-20: qty 2

## 2016-10-20 MED ORDER — NYSTATIN 100000 UNIT/GM EX POWD: Freq: Three times a day (TID) | CUTANEOUS | 0 refills | Status: DC

## 2016-10-20 MED ORDER — FOSFOMYCIN TROMETHAMINE 3 G PO PACK
3.0000 g | PACK | Freq: Once | ORAL | Status: AC
  Administered 2016-10-20: 3 g via ORAL
  Filled 2016-10-20: qty 3

## 2016-10-20 NOTE — Progress Notes (Signed)
  Echocardiogram 2D Echocardiogram has been performed.  Maico [place]ve[place] T Olene Godfrey 10/20/2016, 4:04 PM

## 2016-10-20 NOTE — Progress Notes (Signed)
Leslie Coleman to be D/C'd Home per MD order.  Discussed prescriptions and follow up appointments with the patient. Prescriptions given to patient, medication list explained in detail. Pt verbalized understanding.  Allergies as of 10/20/2016   No Known Allergies     Medication List    STOP taking these medications   furosemide 40 MG tablet Commonly known as:  LASIX   methocarbamol 500 MG tablet Commonly known as:  ROBAXIN   telmisartan 80 MG tablet Commonly known as:  MICARDIS     TAKE these medications   amiodarone 100 MG tablet Commonly known as:  PACERONE Take 1 tablet (100 mg total) by mouth every morning.   amLODipine 5 MG tablet Commonly known as:  NORVASC Take 1 tablet (5 mg total) by mouth daily.   aspirin 81 MG tablet Take 1 tablet (81 mg total) by mouth daily.   glipiZIDE 5 MG 24 hr tablet Commonly known as:  GLUCOTROL XL Take 5 mg by mouth daily.   hydrALAZINE 50 MG tablet Commonly known as:  APRESOLINE TAKE 1 TABLET BY MOUTH TWICE A DAY   levothyroxine 100 MCG tablet Commonly known as:  SYNTHROID, LEVOTHROID Take 1 tablet (100 mcg total) by mouth daily before breakfast.   Melatonin 5 MG Caps Take 5 mg by mouth daily.   multivitamin with minerals Tabs tablet Take 1 tablet by mouth daily.   nystatin powder Commonly known as:  MYCOSTATIN/NYSTOP Apply topically 3 (three) times daily.   omega-3 acid ethyl esters 1 g capsule Commonly known as:  LOVAZA TAKE 2 CAPSULES BY MOUTH 2 TIMES DAILY.   potassium chloride 10 MEQ tablet Commonly known as:  K-DUR Take 10 mEq by mouth every morning.   pravastatin 20 MG tablet Commonly known as:  PRAVACHOL TAKE 1 TABLET BY MOUTH AT BEDTIME   PROCRIT IJ Inject 10,000 Units as directed every 28 (twenty-eight) days.       Vitals:   10/20/16 0608 10/20/16 0839  BP: (!) 133/52 117/63  Pulse: 73 75  Resp: 18 18  Temp: 98.4 F (36.9 C) 97.9 F (36.6 C)    Skin clean, dry and intact without evidence of  skin break down, no evidence of skin tears noted. IV catheter discontinued intact. Site without signs and symptoms of complications. Dressing and pressure applied. Pt denies pain at this time. No complaints noted.  An After Visit Summary was printed and given to the patient. Patient escorted via [place], and D/C home via private auto.  Emilio Math, RN [facility] Phone [phone]

## 2016-10-20 NOTE — Care Management Obs Status (Signed)
[facility] NOTIFICATION   Patient Details  Name: Leslie Coleman MRN: [id number] Date of Birth: 10-17-1928   Medicare Observation Status Notification Given:       Adron Bene, RN 10/20/2016, 3:30 PM

## 2016-10-20 NOTE — Progress Notes (Signed)
  PHARMACY - PHYSICIAN COMMUNICATION CRITICAL VALUE ALERT - BLOOD CULTURE IDENTIFICATION (BCID)  Results for orders placed or performed during the hospital encounter of 10/19/16  Blood Culture ID Panel (Reflexed) (Collected: 10/19/2016  3:05 PM)  Result Value Ref Range   Enterococcus species NOT DETECTED NOT DETECTED   Listeria monocytogenes NOT DETECTED NOT DETECTED   Staphylococcus species DETECTED (A) NOT DETECTED   Staphylococcus aureus NOT DETECTED NOT DETECTED   Methicillin resistance DETECTED (A) NOT DETECTED   Streptococcus species NOT DETECTED NOT DETECTED   Streptococcus agalactiae NOT DETECTED NOT DETECTED   Streptococcus pneumoniae NOT DETECTED NOT DETECTED   Streptococcus pyogenes NOT DETECTED NOT DETECTED   Acinetobacter baumannii NOT DETECTED NOT DETECTED   Enterobacteriaceae species NOT DETECTED NOT DETECTED   Enterobacter cloacae complex NOT DETECTED NOT DETECTED   Escherichia coli NOT DETECTED NOT DETECTED   Klebsiella oxytoca NOT DETECTED NOT DETECTED   Klebsiella pneumoniae NOT DETECTED NOT DETECTED   Proteus species NOT DETECTED NOT DETECTED   Serratia marcescens NOT DETECTED NOT DETECTED   Haemophilus influenzae NOT DETECTED NOT DETECTED   Neisseria meningitidis NOT DETECTED NOT DETECTED   Pseudomonas aeruginosa NOT DETECTED NOT DETECTED   Candida albicans NOT DETECTED NOT DETECTED   Candida glabrata NOT DETECTED NOT DETECTED   Candida krusei NOT DETECTED NOT DETECTED   Candida parapsilosis NOT DETECTED NOT DETECTED   Candida tropicalis NOT DETECTED NOT DETECTED    Name of physician (or Provider) Contacted: C. Rama  Possible UTI, started on Rocephin. Blood cx 1/2 with staph species  Changes to prescribed antibiotics required: No changes necessary  Elenor Quinones, PharmD, BCPS Clinical Pharmacist Pager [phone] 10/20/2016 1:43 PM

## 2016-10-20 NOTE — Care Management Note (Addendum)
Case Management Note  Patient Details  Name: Leslie Coleman MRN: [id number] Date of Birth: May 18, 1929  Subjective/Objective:          CM following for progression and d/c planning.          Action/Plan: Noted CM consult for HHPT as recommended by PT eval, however per Dr Rockne Menghini pt is refusing [facility] services.  Met with pt who states that she has recently had HHPT services and does not feel that she needs these services at this time.   Expected Discharge Date:  10/20/16               Expected Discharge Plan:  [place]  In-House Referral:  NA  Discharge planning Services  CM Consult  Post Acute Care Choice:  Home Health Choice offered to:  Patient  DME Arranged:  N/A DME Agency:  NA  HH Arranged:  Patient Refused [place] Agency:     Status of Service:  Completed, signed off  If discussed at [employer] of Stay Meetings, dates discussed:    Additional Comments:  Adron Bene, RN 10/20/2016, 3:04 PM

## 2016-10-20 NOTE — Care Management CC44 (Signed)
Condition Code 44 Documentation Completed  Patient Details  Name: Leslie Coleman MRN: [id number] Date of Birth: 10/27/1928   Condition Code 44 given:   yes Patient signature on Condition Code 44 notice:   yes Documentation of 2 MD's agreement:   yes Code 44 added to claim:   yes    Adron Bene, RN 10/20/2016, 3:28 PM

## 2016-10-20 NOTE — Discharge Instructions (Signed)
Acute Kidney Injury, Adult Acute kidney injury is a sudden worsening of kidney function. The kidneys are organs that have several jobs. They filter the blood to remove waste products and extra fluid. They also maintain a healthy balance of minerals and hormones in the body, which helps control blood pressure and keep bones strong. With this condition, your kidneys do not do their jobs as well as they should. This condition ranges from mild to severe. Over time it may develop into long-lasting (chronic) kidney disease. Early detection and treatment may prevent acute kidney injury from developing into a chronic condition. What are the causes? Common causes of this condition include:  A problem with blood flow to the kidneys. This may be caused by:  Low blood pressure (hypotension) or shock.  Blood loss.  Heart and blood vessel (cardiovascular) disease.  Severe burns.  Liver disease.  Direct damage to the kidneys. This may be caused by:  Certain medicines.  A kidney infection.  Poisoning.  Being around or in contact with toxic substances.  A surgical wound.  A hard, direct hit to the kidney area.  A sudden blockage of urine flow. This may be caused by:  Cancer.  Kidney stones.  An enlarged prostate in males. What are the signs or symptoms? Symptoms of this condition may not be obvious until the condition becomes severe. Symptoms of this condition can include:  Tiredness (lethargy), or difficulty staying awake.  Nausea or vomiting.  Swelling (edema) of the face, legs, ankles, or feet.  Problems with urination, such as:  Abdominal pain, or pain along the side of your stomach (flank).  Decreased urine production.  Decrease in the force of urine flow.  Muscle twitches and cramps, especially in the legs.  Confusion or trouble concentrating.  Loss of appetite.  Fever. How is this diagnosed? This condition may be diagnosed with tests, including:  Blood  tests.  Urine tests.  Imaging tests.  A test in which a sample of tissue is removed from the kidneys to be examined under a microscope (kidney biopsy). How is this treated? Treatment for this condition depends on the cause and how severe the condition is. In mild cases, treatment may not be needed. The kidneys may heal on their own. In more severe cases, treatment will involve:  Treating the cause of the kidney injury. This may involve changing any medicines you are taking or adjusting your dosage.  Fluids. You may need specialized IV fluids to balance your body's needs.  Having a catheter placed to drain urine and prevent blockages.  Preventing problems from occurring. This may mean avoiding certain medicines or procedures that can cause further injury to the kidneys. In some cases treatment may also require:  A procedure to remove toxic wastes from the body (dialysis or continuous renal replacement therapy - CRRT).  Surgery. This may be done to repair a torn kidney, or to remove the blockage from the urinary system. Follow these instructions at home: Medicines   Take over-the-counter and prescription medicines only as told by your health care provider.  Do not take any new medicines without your health care provider's approval. Many medicines can worsen your kidney damage.  Do not take any vitamin and mineral supplements without your health care provider's approval. Many nutritional supplements can worsen your kidney damage. Lifestyle   If your health care provider prescribed changes to your diet, follow them. You may need to decrease the amount of protein you eat.  Achieve and maintain a   healthy weight. If you need help with this, ask your health care provider.  Start or continue an exercise plan. Try to exercise at least 30 minutes a day, 5 days a week.  Do not use any tobacco products, such as cigarettes, chewing tobacco, and e-cigarettes. If you need help quitting, ask  your health care provider. General instructions   Keep track of your blood pressure. Report changes in your blood pressure as told by your health care provider.  Stay up to date with immunizations. Ask your health care provider which immunizations you need.  Keep all follow-up visits as told by your health care provider. This is important. Where to find more information:  American Association of Kidney Patients: www.aakp.org  National Kidney Foundation: www.kidney.org  [employer]: www.akfinc.org  [facility]:  www.lifeoptions.org  www.kidneyschool.org Contact a health care provider if:  Your symptoms get worse.  You develop new symptoms. Get help right away if:  You develop symptoms of worsening kidney disease, which include:  Headaches.  Abnormally dark or light skin.  Easy bruising.  Frequent hiccups.  Chest pain.  Shortness of breath.  End of menstruation in women.  Seizures.  Confusion or altered mental status.  Abdominal or back pain.  Itchiness.  You have a fever.  Your body is producing less urine.  You have pain or bleeding when you urinate. Summary  Acute kidney injury is a sudden worsening of kidney function.  Acute kidney injury can be caused by problems with blood flow to the kidneys, direct damage to the kidneys, and sudden blockage of urine flow.  Symptoms of this condition may not be obvious until it becomes severe. Symptoms may include edema, lethargy, confusion, nausea or vomiting, and problems passing urine.  This condition can usually be diagnosed with blood tests, urine tests, and imaging tests. Sometimes a kidney biopsy is done to diagnose this condition.  Treatment for this condition often involves treating the underlying cause. It is treated with fluids, medicines, dialysis, diet changes, or surgery. This information is not intended to replace advice given to you by your health care provider.  Make sure you discuss any questions you have with your health care provider. Document Released: 11/21/2010 Document Revised: 04/28/2016 Document Reviewed: 04/28/2016 Elsevier Interactive Patient Education  2017 [employer]  

## 2016-10-20 NOTE — Evaluation (Signed)
Physical Therapy Evaluation Patient Details Name: Leslie Coleman MRN: [id number] DOB: Jul 06, 1928 Today's Date: 10/20/2016   History of Present Illness  Pt is a 83 yo female admitted through ED on 10/19/16 with dizziness without syncopal episode. Pt was diagnosed with an AKI, dehydration and UTI. PMH significant for DM2, CKD, ventricular tachycardia, anemia, HLD, and recent hospitalization in December for right tib/fib fracture following a fall.   Clinical Impression  Pt presents with the above diagnosis and below deficits for therapy evaluation. Prior to admission, pt lived with her 4 yo husband in a single level home receiving occasional assistance from family for transportation to MD appointments. Pt is able to perform mobility with min guard to min a this session with RW for gait. Pt will benefit from continued acute rehab services in order to address the below deficits prior to discharge home.     Follow Up Recommendations Home health PT;Other (comment) (pt will probably refuse)    Equipment Recommendations  None recommended by PT    Recommendations for Other Services       Precautions / Restrictions Precautions Precautions: Fall Restrictions Weight Bearing Restrictions: No      Mobility  Bed Mobility Overal bed mobility: Needs Assistance Bed Mobility: Supine to Sit     Supine to sit: Min assist     General bed mobility comments: Min a to Coleman trunk upright at EOB  Transfers Overall transfer level: Needs assistance Equipment used: Rolling walker (2 wheeled) Transfers: Sit to/from Stand Sit to Stand: Min guard         General transfer comment: min guard for EOB with cues for hand placement  Ambulation/Gait Ambulation/Gait assistance: Min guard Ambulation Distance (Feet): 40 Feet Assistive device: Rolling walker (2 wheeled) Gait Pattern/deviations: Step-through pattern;Decreased step length - right;Decreased step length - left Gait velocity: decreased Gait  velocity interpretation: Below normal speed for age/gender General Gait Details: decreased cadence, but good swing through pattern and stability with Rw. Good positioning inside RW with gait.   Stairs            Wheelchair Mobility    Modified Rankin (Stroke Patients Only)       Balance Overall balance assessment: Needs assistance Sitting-balance support: No upper extremity supported;Feet supported Sitting balance-Leahy Scale: Good     Standing balance support: Bilateral upper extremity supported;During functional activity Standing balance-Leahy Scale: Poor Standing balance comment: reliant on RW for stability in standing                             Pertinent Vitals/Pain Pain Assessment: No/denies pain    Home Living Family/patient expects to be discharged to:: Private residence Living Arrangements: Spouse/significant other Available Help at Discharge: Family;Available PRN/intermittently Type of Home: House Home Access: Ramped entrance     Home Layout: One level Home Equipment: Walker - 2 wheels;Cane - single point;Wheelchair - manual      Prior Function Level of Independence: Independent with assistive device(s)         Comments: was independent getting around with walker in the house and was cooking and cleaning     Hand Dominance   Dominant Hand: Right    Extremity/Trunk Assessment   Upper Extremity Assessment Upper Extremity Assessment: Overall WFL for tasks assessed    Lower Extremity Assessment Lower Extremity Assessment: Generalized weakness    Cervical / Trunk Assessment Cervical / Trunk Assessment: Normal  Communication   Communication: No difficulties  Cognition  Arousal/Alertness: Awake/alert Behavior During Therapy: WFL for tasks assessed/performed Overall Cognitive Status: Within Functional Limits for tasks assessed                                        General Comments      Exercises      Assessment/Plan    PT Assessment Patient needs continued PT services  PT Problem List Decreased activity tolerance;Decreased balance;Decreased mobility;Decreased knowledge of use of DME       PT Treatment Interventions DME instruction;Gait training;Functional mobility training;Therapeutic activities;Therapeutic exercise;Balance training    PT Goals (Current goals can be found in the Care Plan section)  Acute Rehab PT Goals Patient Stated Goal: to get home today PT Goal Formulation: With patient Time For Goal Achievement: 10/27/16 Potential to Achieve Goals: Good    Frequency Min 3X/week   Barriers to discharge        Co-evaluation               AM-PAC PT "6 Clicks" Daily Activity  Outcome Measure Difficulty turning over in bed (including adjusting bedclothes, sheets and blankets)?: None Difficulty moving from lying on back to sitting on the side of the bed? : Total Difficulty sitting down on and standing up from a chair with arms (e.g., wheelchair, bedside commode, etc,.)?: A Little Help needed moving to and from a bed to chair (including a wheelchair)?: A Little Help needed walking in hospital room?: A Little Help needed climbing 3-5 steps with a railing? : A Little 6 Click Score: 17    End of Session Equipment Utilized During Treatment: Gait belt Activity Tolerance: Patient tolerated treatment well Patient left: in bed;with call bell/phone within reach;with nursing/sitter in room Nurse Communication: Mobility status PT Visit Diagnosis: Muscle weakness (generalized) (M62.81);Unsteadiness on feet (R26.81)    Time: 6256-3893 PT Time Calculation (min) (ACUTE ONLY): 19 min   Charges:   PT Evaluation $PT Eval Moderate Complexity: 1 Procedure     PT G Codes:        Scheryl Marten PT, DPT  [phone]   Shanon Rosser 10/20/2016, 11:03 AM

## 2016-10-20 NOTE — Discharge Summary (Addendum)
Physician Discharge Summary  Leslie Coleman HAL:[id number] DOB: 1928-07-27 DOA: 10/19/2016  PCP: Burtis Junes, NP  Admit date: 10/19/2016 Discharge date: 10/20/2016  Admitted From: Home Discharge disposition: Home   Recommendations for Outpatient Follow-Up:   1. Lasix held at discharge. Will need repeat BMET prior to re-initiating.  2. Refused further home PT despite recommendations by physical therapy for home PT. 3. PCP: Please follow-up on final blood and urine culture results.   Discharge Diagnosis:   Principal Problem:   Acute kidney injury ([place]) Active Problems:   Dizziness   CKD (chronic kidney disease) stage 4, GFR 15-29 ml/min (HCC)   Hypothyroidism, adult   Abnormal urinalysis   Diabetes mellitus type 2, uncontrolled (HCC)   Anemia due to chronic kidney disease   Thrombocytopenia (HCC)   Candidal dermatitis   Stress incontinence   History of V-tach (HCC)   Pressure injury of skin: Sacral area   Diabetes mellitus with complication (HCC)   Urinary tract infection without hematuria   Oxacillin resistant coagulase-negative staph bacteremia  Discharge Condition: Improved.  Diet recommendation: Low sodium, heart healthy.    History of Present Illness:   Leslie Coleman is an 83 y.o. female the PMH of diabetes, stage IV CK D, history of ventricular tachycardia managed with amiodarone, hypothyroidism, AOCD on Procrit, hospitalization 1/18 for treatment of right tibial plateau fracture who was admitted 10/19/16 with a chief complaint of dizziness without syncope, palpitations or chest pain. Upon initial evaluation in the ED, she was noted to have a rise in her creatinine over usual baseline values (BUN 32, creatinine 2.31) and was thought to be dehydrated with possible UTI.  Hospital Course by Problem:   Principal Problem:   Acute kidney injury (HCC)/Stage IV CKD Baseline creatinine appears to be around 1.73. ARB and Lasix placed on hold on admission, with gentle  IV fluids ordered. Creatinine slowly improving. Continue gentle hydration until discharge and continue to hold Lasix and ARB. Recommend close outpatient follow-up with PCP to recheck labs within one week.  Active Problems:   Dizziness No evidence of orthostasis. Continue to monitor for arrhythmias on telemetry. Refused home PT.    Hypothyroidism, adult Continue Synthroid. TSH WNL.    Abnormal urinalysis/UTI without hematuria Placed on empiric Rocephin for concerns of UTI. 80,000 colonies of Citrobacter present on preliminary culture. Patient was treated with 3 g dose of fosfomycin prior to discharge, and does not need further antibiotics for this.    Diabetes mellitus type 2, uncontrolled ([place]), with complications Managed with oral hypoglycemics at home. Hemoglobin A1c is 6.0. CBGs 93-116 on glipizide and insulin sensitive SSI.    Anemia due to chronic kidney disease Hemoglobin currently stable. Receives Procrit as outpatient.    Thrombocytopenia (HCC) Platelet count stable and 140s.    Candidal dermatitis Continue nystatin powder to inguinal areas.    Stress incontinence Chronic problem. Consider outpatient urology workup.    V-tach [facility]) Remote. Maintaining NSR on telemetry. Continue amiodarone.    Pressure injury of skin to sacrum Sacral foam prophylactic dressing protocol per nursing.    Hypertension Continue Norvasc and hydralazine. ARB and lasix on hold.    Dyslipidemia Continue pravastatin and fish oil.    Hypokalemia/hypomagnesemia Replete.    Oxacillin resistant coagulase negative staph bacteremia Likely contaminant.    Medical Consultants:    None.   Discharge Exam:   Vitals:   10/20/16 0608 10/20/16 0839  BP: (!) 133/52 117/63  Pulse: 73 75  Resp: 18 18  Temp: 98.4 F (36.9 C) 97.9 F (36.6 C)   Vitals:   10/19/16 2103 10/19/16 2157 10/20/16 0608 10/20/16 0839  BP: (!) 139/48 125/76 (!) 133/52 117/63  Pulse: 64 68 73 75    Resp: 18  18 18   Temp: 97.8 F (36.6 C)  98.4 F (36.9 C) 97.9 F (36.6 C)  TempSrc: Oral  Oral Oral  SpO2: 95%  95% 96%  Weight: 70.2 kg (154 lb 12.8 oz)     Height:        General exam: Appears calm and comfortable. Mildly hard of hearing. Respiratory system: Clear to auscultation. Respiratory effort normal. Cardiovascular system: S1 & S2 heard, RRR. No JVD,  rubs, gallops or clicks. No murmurs. Gastrointestinal system: Abdomen is nondistended, soft and nontender. No organomegaly or masses felt. Normal bowel sounds heard. Central nervous system: Alert and oriented x 3. No focal neurological deficits. Extremities: No clubbing,  or cyanosis. No edema. Skin: No rashes, lesions or ulcers. Psychiatry: Judgement and insight appear normal. Mood & affect appropriate.    The results of significant diagnostics from this hospitalization (including imaging, microbiology, ancillary and laboratory) are listed below for reference.     Procedures and Diagnostic Studies:   Dg Chest 2 View  Result Date: 10/19/2016 CLINICAL DATA:  Intermittent dizziness since 4:30 this morning. EXAM: CHEST  2 VIEW COMPARISON:  09/29/2015. FINDINGS: Stable borderline enlarged cardiac silhouette and tortuous and calcified thoracic aorta. Clear lungs with normal vascularity. Mild diffuse peribronchial thickening without significant change. Diffuse osteopenia. Thoracic spine degenerative changes. IMPRESSION: No acute abnormality.  Stable mild chronic bronchitic changes. Electronically Signed   By: Claudie Revering M.D.   On: 10/19/2016 13:32     Labs:   Basic Metabolic Panel:  Recent Labs Lab 10/19/16 1253 10/19/16 2044 10/20/16 0418  NA 138  --  140  K 3.4*  --  3.2*  CL 104  --  107  CO2 22  --  24  GLUCOSE 213*  --  84  BUN 32*  --  28*  CREATININE 2.31*  --  2.16*  CALCIUM 9.6  --  8.8*  MG  --  1.5*  --   PHOS  --  3.2  --    GFR Estimated Creatinine Clearance: 17.1 mL/min (A) (by C-G formula based  on SCr of 2.16 mg/dL (H)). Liver Function Tests:  Recent Labs Lab 10/20/16 0418  AST 20  ALT 12*  ALKPHOS 61  BILITOT 0.6  PROT 5.8*  ALBUMIN 2.6*   No results for input(s): LIPASE, AMYLASE in the last 168 hours. No results for input(s): AMMONIA in the last 168 hours. Coagulation profile No results for input(s): INR, PROTIME in the last 168 hours.  CBC:  Recent Labs Lab 10/17/16 0815 10/19/16 1253 10/20/16 0418  WBC  --  6.2 5.7  NEUTROABS  --  4.8  --   HGB 10.3* 10.4* 9.3*  HCT  --  32.4* 28.9*  MCV  --  88.8 89.8  PLT  --  142* 142*   Cardiac Enzymes:  Recent Labs Lab 10/19/16 1523 10/19/16 2044 10/20/16 0418  TROPONINI <0.03 <0.03 <0.03   BNP: Invalid input(s): POCBNP CBG:  Recent Labs Lab 10/19/16 1734 10/19/16 2108 10/20/16 0757 10/20/16 1159  GLUCAP 116* 93 102* 129*   D-Dimer No results for input(s): DDIMER in the last 72 hours. Hgb A1c  Recent Labs  10/19/16 1438  HGBA1C 6.0*   Lipid Profile No results for input(s): CHOL, HDL, LDLCALC, TRIG,  CHOLHDL, LDLDIRECT in the last 72 hours. Thyroid function studies  Recent Labs  10/19/16 1433  TSH 0.544   Anemia work up No results for input(s): VITAMINB12, FOLATE, FERRITIN, TIBC, IRON, RETICCTPCT in the last 72 hours. Microbiology Recent Results (from the past 240 hour(s))  Urine culture     Status: Abnormal (Preliminary result)   Collection Time: 10/19/16 12:53 PM  Result Value Ref Range Status   Specimen Description URINE, CATHETERIZED  Final   Special Requests NONE  Final   Culture (A)  Final    80,000 COLONIES/mL CITROBACTER YOUNGAE SUSCEPTIBILITIES TO FOLLOW    Report Status PENDING  Incomplete  Blood culture (routine x 2)     Status: None (Preliminary result)   Collection Time: 10/19/16  3:05 PM  Result Value Ref Range Status   Specimen Description BLOOD LEFT ANTECUBITAL  Final   Special Requests   Final    BOTTLES DRAWN AEROBIC AND ANAEROBIC Blood Culture adequate volume    Culture  Setup Time   Final    GRAM POSITIVE COCCI IN CLUSTERS AEROBIC BOTTLE ONLY Organism ID to follow    Culture NO GROWTH < 24 HOURS  Final   Report Status PENDING  Incomplete  Blood Culture ID Panel (Reflexed)     Status: Abnormal   Collection Time: 10/19/16  3:05 PM  Result Value Ref Range Status   Enterococcus species NOT DETECTED NOT DETECTED Final   Listeria monocytogenes NOT DETECTED NOT DETECTED Final   Staphylococcus species DETECTED (A) NOT DETECTED Final    Comment: Methicillin (oxacillin) resistant coagulase negative staphylococcus. Possible blood culture contaminant (unless isolated from more than one blood culture draw or clinical case suggests pathogenicity). No antibiotic treatment is indicated for blood  culture contaminants. CRITICAL RESULT CALLED TO, READ BACK BY AND VERIFIED WITH: N. Batchelder Pharm.D. 12:05 10/20/16 ([email or web address])    Staphylococcus aureus NOT DETECTED NOT DETECTED Final   Methicillin resistance DETECTED (A) NOT DETECTED Final    Comment: CRITICAL RESULT CALLED TO, READ BACK BY AND VERIFIED WITH: N. Batchelder Pharm.D. 12:05 10/20/16 ([email or web address])    Streptococcus species NOT DETECTED NOT DETECTED Final   Streptococcus agalactiae NOT DETECTED NOT DETECTED Final   Streptococcus pneumoniae NOT DETECTED NOT DETECTED Final   Streptococcus pyogenes NOT DETECTED NOT DETECTED Final   Acinetobacter baumannii NOT DETECTED NOT DETECTED Final   Enterobacteriaceae species NOT DETECTED NOT DETECTED Final   Enterobacter cloacae complex NOT DETECTED NOT DETECTED Final   Escherichia coli NOT DETECTED NOT DETECTED Final   Klebsiella oxytoca NOT DETECTED NOT DETECTED Final   Klebsiella pneumoniae NOT DETECTED NOT DETECTED Final   Proteus species NOT DETECTED NOT DETECTED Final   Serratia marcescens NOT DETECTED NOT DETECTED Final   Haemophilus influenzae NOT DETECTED NOT DETECTED Final   Neisseria meningitidis NOT DETECTED NOT DETECTED Final   Pseudomonas aeruginosa  NOT DETECTED NOT DETECTED Final   Candida albicans NOT DETECTED NOT DETECTED Final   Candida glabrata NOT DETECTED NOT DETECTED Final   Candida krusei NOT DETECTED NOT DETECTED Final   Candida parapsilosis NOT DETECTED NOT DETECTED Final   Candida tropicalis NOT DETECTED NOT DETECTED Final  Blood culture (routine x 2)     Status: None (Preliminary result)   Collection Time: 10/19/16  3:30 PM  Result Value Ref Range Status   Specimen Description BLOOD RIGHT FOREARM  Final   Special Requests   Final    BOTTLES DRAWN AEROBIC ONLY Blood Culture adequate volume   Culture NO GROWTH < 24  HOURS  Final   Report Status PENDING  Incomplete     Discharge Instructions:   Discharge Instructions    Call MD for:  persistant dizziness or light-headedness    Complete by:  As directed    Call MD for:  persistant nausea and vomiting    Complete by:  As directed    Call MD for:  temperature >100.4    Complete by:  As directed    Diet - low sodium heart healthy    Complete by:  As directed    Increase activity slowly    Complete by:  As directed      Allergies as of 10/20/2016   No Known Allergies     Medication List    STOP taking these medications   furosemide 40 MG tablet Commonly known as:  LASIX   methocarbamol 500 MG tablet Commonly known as:  ROBAXIN   telmisartan 80 MG tablet Commonly known as:  MICARDIS     TAKE these medications   amiodarone 100 MG tablet Commonly known as:  PACERONE Take 1 tablet (100 mg total) by mouth every morning.   amLODipine 5 MG tablet Commonly known as:  NORVASC Take 1 tablet (5 mg total) by mouth daily.   aspirin 81 MG tablet Take 1 tablet (81 mg total) by mouth daily.   glipiZIDE 5 MG 24 hr tablet Commonly known as:  GLUCOTROL XL Take 5 mg by mouth daily.   hydrALAZINE 50 MG tablet Commonly known as:  APRESOLINE TAKE 1 TABLET BY MOUTH TWICE A DAY   levothyroxine 100 MCG tablet Commonly known as:  SYNTHROID, LEVOTHROID Take 1 tablet  (100 mcg total) by mouth daily before breakfast.   Melatonin 5 MG Caps Take 5 mg by mouth daily.   multivitamin with minerals Tabs tablet Take 1 tablet by mouth daily.   nystatin powder Commonly known as:  MYCOSTATIN/NYSTOP Apply topically 3 (three) times daily.   omega-3 acid ethyl esters 1 g capsule Commonly known as:  LOVAZA TAKE 2 CAPSULES BY MOUTH 2 TIMES DAILY.   potassium chloride 10 MEQ tablet Commonly known as:  K-DUR Take 10 mEq by mouth every morning.   pravastatin 20 MG tablet Commonly known as:  PRAVACHOL TAKE 1 TABLET BY MOUTH AT BEDTIME   PROCRIT IJ Inject 10,000 Units as directed every 28 (twenty-eight) days.   sulfamethoxazole-trimethoprim 800-160 MG tablet Commonly known as:  BACTRIM DS,SEPTRA DS Take 1 tablet by mouth 2 (two) times daily.      Follow-up Information    Burtis Junes, NP. Schedule an appointment as soon as possible for a visit in 1 week(s).   Specialties:  Nurse Practitioner, Interventional Cardiology, Cardiology, Radiology Why:  To re-check your blood work.  Do not restart Lasix (furosemide) until you see your doctor in follow up and she re-checks your labs.   Contact information: [place] [place] [address] [phone]            Time coordinating discharge: 35 minutes.  Signed:  RAMA,CHRISTINA  Pager [phone] [facility] 10/20/2016, 2:56 PM

## 2016-10-21 LAB — CULTURE, BLOOD (ROUTINE X 2): Special Requests: ADEQUATE

## 2016-10-21 LAB — URINE CULTURE

## 2016-10-24 ENCOUNTER — Telehealth: Payer: Self-pay | Admitting: *Deleted

## 2016-10-24 DIAGNOSIS — E118 Type 2 diabetes mellitus with unspecified complications: Secondary | ICD-10-CM | POA: Diagnosis not present

## 2016-10-24 DIAGNOSIS — N184 Chronic kidney disease, stage 4 (severe): Secondary | ICD-10-CM | POA: Diagnosis not present

## 2016-10-24 DIAGNOSIS — R300 Dysuria: Secondary | ICD-10-CM | POA: Diagnosis not present

## 2016-10-24 DIAGNOSIS — R55 Syncope and collapse: Secondary | ICD-10-CM | POA: Diagnosis not present

## 2016-10-24 DIAGNOSIS — N390 Urinary tract infection, site not specified: Secondary | ICD-10-CM | POA: Diagnosis not present

## 2016-10-24 LAB — CULTURE, BLOOD (ROUTINE X 2)
CULTURE: NO GROWTH
SPECIAL REQUESTS: ADEQUATE

## 2016-10-24 NOTE — Telephone Encounter (Signed)
Good.

## 2016-10-24 NOTE — Telephone Encounter (Signed)
S/w penny at Dr. [place]l [place] office, pt had appointment, today, at 9:15 am. Will send to [place] to [place].

## 2016-10-24 NOTE — Telephone Encounter (Signed)
-----   Message from Burtis Junes, NP sent at 10/23/2016  5:37 PM EDT ----- Thanks.  Let's call the PCP and make sure that they get her back in as well.  lori ----- Message ----- From: Buel Ream, Lab In [place] Sent: 10/21/2016   8:58 AM To: Burtis Junes, NP

## 2016-10-26 NOTE — Progress Notes (Signed)
Late Entry G-Codes for visit on Oct 25, 2016    25-Oct-2016 1058  PT G-Codes **NOT FOR INPATIENT CLASS**  Functional Assessment Tool Used AM-PAC 6 Clicks Basic Mobility;Clinical judgement  Functional Limitation Mobility: Walking and moving around  Mobility: Walking and Moving Around Current Status [phone]) CK  Mobility: Walking and Moving Around Goal Status (J6811) CI   Scheryl Marten PT, DPT  [phone]

## 2016-11-02 ENCOUNTER — Other Ambulatory Visit: Payer: Self-pay | Admitting: Nurse Practitioner

## 2016-11-07 ENCOUNTER — Encounter: Payer: Self-pay | Admitting: Nurse Practitioner

## 2016-11-07 ENCOUNTER — Ambulatory Visit (INDEPENDENT_AMBULATORY_CARE_PROVIDER_SITE_OTHER): Payer: Medicare Other | Admitting: Nurse Practitioner

## 2016-11-07 VITALS — BP 180/78 | HR 84 | Ht 62.0 in | Wt 153.8 lb

## 2016-11-07 DIAGNOSIS — I10 Essential (primary) hypertension: Secondary | ICD-10-CM

## 2016-11-07 DIAGNOSIS — I472 Ventricular tachycardia, unspecified: Secondary | ICD-10-CM

## 2016-11-07 DIAGNOSIS — I519 Heart disease, unspecified: Secondary | ICD-10-CM | POA: Diagnosis not present

## 2016-11-07 DIAGNOSIS — E038 Other specified hypothyroidism: Secondary | ICD-10-CM

## 2016-11-07 DIAGNOSIS — Z79899 Other long term (current) drug therapy: Secondary | ICD-10-CM | POA: Diagnosis not present

## 2016-11-07 DIAGNOSIS — R001 Bradycardia, unspecified: Secondary | ICD-10-CM

## 2016-11-07 NOTE — Patient Instructions (Addendum)
We will be checking the following labs today - NONE   Medication Instructions:    Continue with your current medicines. Go by the current list you are given today.     Testing/Procedures To Be Arranged:  N/A  Follow-Up:   See me in 6 weeks and will plan to recheck labs     Other Special Instructions:   N/A    If you need a refill on your cardiac medications before your next appointment, please call your pharmacy.   Call the [place] office at [phone] if you have any questions, problems or concerns.

## 2016-11-07 NOTE — Progress Notes (Signed)
CARDIOLOGY OFFICE NOTE  Date:  11/07/2016    Leslie Coleman Date of Birth: 1929/03/09 Medical Record #[id number]  PCP:  Burtis Junes, NP  Cardiologist:  Servando Snare Allred    Chief Complaint  Patient presents with  . Irregular Heart Beat    Follow up visit - seen for Dr. Rayann Heman    History of Present Illness: Leslie Coleman is a 83 y.o. female who presents today for a follow up visit. Seen for Dr. Rayann Heman - former patient of Dr. Susa Simmonds.   She has a remote history of VT - treated initially with procainamide but switched to amiodarone back in 2008 due to unavailability of the medicine. Does have chronically abnormal PFTs. Other issues include HTN, gout, obesity, DM and anemia. On chronic Procrit.   I saw her back in September &October of 2015 - seemed to be getting feeble. Had a cold. Chronic DOE noted. More swelling. Weight was up. I added Lasix for a few days and she ended up taking just prn. Cut her atenolol back due to bradycardia and updated her labs and echo. TSH was elevated and we increased her thyroid medicine. She saw Dr. Rayann Heman in June of 2016 and was felt to be stable.   She was in the ER back in May of 2017 - was dizzy. Looks like she was treated with IVF and antibiotics. Her HR was 42 by EKG - this was notaddressed upon my review. I then saw her in [place] her regular check - she remained bradycardic - she was asymptomatic - I stopped her Atenolol. Last visit with me was back in November and she was doing well. Cardiac status seemed ok. She had lost a significant amount of weight - supposedly intentional.   Several phone calls this past February to discuss medicines. Had been at some rehab facility after falling and breaking her leg - medicines very difficult to clarify - she was asked to come in and bring all her bottles and instructions with her so we could straighten out. I then saw her - she had multiple lists, very confused overall about what she was  taking. Daughter did not seem to know what she was taking as well. I attempted to straighten this out. Last seen by me back in March and seemed better.   Hospitalized last month with dizziness - thought to be dehydrated with possible UTI. Hydrated. Lasix and ARB held. She was not orthostatic.   Comes in today. Here with family today. She says she is doing ok. No chest pain. Breathing is stable. Walking in her house with a walker. No longer driving. Not dizzy anymore. Does not drink enough water. Likes soda. No falls. She is using "pill packs" - her lasix and ARB have not been taken out of the pill packs - thus, she is still taking. Not checking BP at home.   Past Medical History:  Diagnosis Date  . Abnormal PFTs (pulmonary function tests) 2008   No obstruction noted. Has been followed clinically.  . Anemia    treated with Procrit  . Fall from slip, trip, or stumble 05/07/2016   [place] resulting in right tibial plateau fracture  . Gout   . High risk medication use    on amiodarone.  Marland Kitchen HTN (hypertension)    Last echo in 2004; EF normal  . Hyperlipidemia   . Hypothyroidism    on replacement  . Irregular heart beat "since 1985"  . Obesity   .  Type II diabetes mellitus ([place])   . V-tach [facility])    remote VT, treated initially with procainamide, on amiodarone since 2008    Past Surgical History:  Procedure Laterality Date  . CARDIOVASCULAR STRESS TEST  07/07/2003   EF 70%. NO EVIDENCE OF ISCHEMIA  . CARDIOVERSION  1978  . CATARACT EXTRACTION, BILATERAL Bilateral   . CATARACT EXTRACTION, BILATERAL Bilateral   . CESAREAN SECTION  1957; 1961; 1967  . FRACTURE SURGERY    . ORIF TIBIA PLATEAU Right 05/10/2016   Procedure: OPEN REDUCTION INTERNAL FIXATION (ORIF) RIGHT TIBIAL PLATEAU  WITH BONE GRAFT;  Surgeon: Leandrew Koyanagi, MD;  Location: [facility];  Service: Orthopedics;  Laterality: Right;  . US ECHOCARDIOGRAPHY  12/29/2002   EF 60-65%  . VAGINAL HYSTERECTOMY        Medications: Current Outpatient Prescriptions  Medication Sig Dispense Refill  . amiodarone (PACERONE) 100 MG tablet Take 1 tablet (100 mg total) by mouth every morning. 7 tablet 0  . amLODipine (NORVASC) 5 MG tablet Take 1 tablet (5 mg total) by mouth daily. 90 tablet 2  . aspirin 81 MG EC tablet Take 1 tablet (81 mg total) by mouth daily. 30 tablet 8  . Epoetin Alfa (PROCRIT IJ) Inject 10,000 Units as directed every 28 (twenty-eight) days.     Marland Kitchen glipiZIDE (GLUCOTROL XL) 5 MG 24 hr tablet Take 5 mg by mouth daily.    . hydrALAZINE (APRESOLINE) 50 MG tablet TAKE 1 TABLET BY MOUTH TWICE A DAY 180 tablet 3  . levothyroxine (SYNTHROID, LEVOTHROID) 100 MCG tablet Take 1 tablet (100 mcg total) by mouth daily before breakfast. 30 tablet 9  . Melatonin 5 MG CAPS Take 5 mg by mouth daily.    . Multiple Vitamin (MULTIVITAMIN WITH MINERALS) TABS tablet Take 1 tablet by mouth daily.    Marland Kitchen nystatin (MYCOSTATIN/NYSTOP) powder Apply topically 3 (three) times daily. 15 g 0  . omega-3 acid ethyl esters (LOVAZA) 1 g capsule TAKE 2 CAPSULES BY MOUTH 2 TIMES DAILY. 360 capsule 3  . potassium chloride (K-DUR) 10 MEQ tablet Take 10 mEq by mouth every morning.     . pravastatin (PRAVACHOL) 20 MG tablet TAKE 1 TABLET BY MOUTH AT BEDTIME 30 tablet 9   No current facility-administered medications for this visit.     Allergies: No Known Allergies  Social History: The patient  reports that she has never smoked. She has never used smokeless tobacco. She reports that she does not drink alcohol or use drugs.   Family History: The patient's family history includes Heart disease in her father and mother.   Review of Systems: Please see the history of present illness.   Otherwise, the review of systems is positive for none.   All other systems are reviewed and negative.   Physical Exam: VS:  BP (!) 180/78 (BP Location: Left Arm, Patient Position: Sitting, Cuff Size: Large)   Pulse 84   Ht 5\' 2"  (1.575 m)    Wt 153 lb 12.8 oz (69.8 kg)   SpO2 99% Comment: at rest  BMI 28.13 kg/m  .  BMI Body mass index is 28.13 kg/m.  Wt Readings from Last 3 Encounters:  11/07/16 153 lb 12.8 oz (69.8 kg)  10/19/16 154 lb 12.8 oz (70.2 kg)  08/08/16 149 lb 6.4 oz (67.8 kg)   Repeat BP by me is 150/80 = she has just taken her medicines.   General: Pleasant. Elderly female. She is alert in no acute distress. In a  wheelchair.   HEENT: Normal.  Neck: Supple, no JVD, carotid bruits, or masses noted.  Cardiac: Regular rate and rhythm. No murmurs, rubs, or gallops. No edema.  Respiratory:  Lungs are clear to auscultation bilaterally with normal work of breathing.  GI: Soft and nontender.  MS: No deformity or atrophy. Gait not tested Skin: Warm and dry. Color is normal.  Neuro:  Strength and sensation are intact and no gross focal deficits noted.  Psych: Alert, appropriate and with normal affect.   LABORATORY DATA:  EKG:  EKG is not ordered today.  Lab Results  Component Value Date   WBC 5.7 10/20/2016   HGB 9.3 (L) 10/20/2016   HCT 28.9 (L) 10/20/2016   PLT 142 (L) 10/20/2016   GLUCOSE 84 10/20/2016   CHOL 106 04/04/2016   TRIG 210 (H) 04/04/2016   HDL 21 (L) 04/04/2016   LDLCALC 43 04/04/2016   ALT 12 (L) 10/20/2016   AST 20 10/20/2016   NA 140 10/20/2016   K 3.2 (L) 10/20/2016   CL 107 10/20/2016   CREATININE 2.16 (H) 10/20/2016   BUN 28 (H) 10/20/2016   CO2 24 10/20/2016   TSH 0.544 10/19/2016   INR 1.11 06/18/2014   HGBA1C 6.0 (H) 10/19/2016     BNP (last 3 results) No results for input(s): BNP in the last 8760 hours.  ProBNP (last 3 results) No results for input(s): PROBNP in the last 8760 hours.   Other Studies Reviewed Today:  Echo Study Conclusions from September 2015  - Left ventricle: The cavity size was normal. Wall thickness was normal. Systolic function was normal. The estimated ejection fraction was in the range of 60% to 65%. Left ventricular diastolic function  parameters were normal. - Mitral valve: There was mild regurgitation. - Left atrium: The atrium was mildly dilated. - Right atrium: The atrium was mildly dilated.  Assessment / Plan:  1. Recent admission for dehydration and UTI - was to have stopped her Lasix and ARB - this is most likely not been done due to her pharmacy service - they send her medicines monthly. Family informed to pull the Lasix and ARB out of the pill packs. See back in 6 weeks for lab and BP check.   2. HTN - I cut her Norvasc back earlier this year - may need to adjust back up since stopping diuretic and ARB for recent dehydration/CKD.   3. History of VT - maintained on chronic low dose amiodarone therapy - was quite bradycardic at prior visit back in the summer- stopped her Atenolol - this has improved.    4. Asymptomatic bradycardia - resolved with cessation of beta blocker.   5. HLD - on statin therapy  6. Mild LV dysfunction - most recent echo showing normal EF. She does not seem to be symptomatic. Would manage conservatively regardless.   7. Hypothyoidism - some inconsistencies in the past due to medication adherence.   8. History of abnormal PFTs - just followed clinically - fortunately, she her shortness of breath seems stable. Not really noted today.     Current medicines are reviewed with the patient today.  The patient does not have concerns regarding medicines other than what has been noted above.  The following changes have been made:  See above.  Labs/ tests ordered today include:   No orders of the defined types were placed in this encounter.    Disposition:   FU with me in 6 weeks. Plan to recheck lab on return.  Patient is agreeable to this plan and will call if any problems develop in the interim.   SignedTruitt Merle, NP  11/07/2016 10:55 AM  [place] [address] [place] [employer], [place]  [address] Phone: [phone] Fax:  [phone]

## 2016-11-13 ENCOUNTER — Encounter (HOSPITAL_COMMUNITY)
Admission: RE | Admit: 2016-11-13 | Discharge: 2016-11-13 | Disposition: A | Discharge status: Home / Self Care | Payer: Medicare Other | Source: Ambulatory Visit | Attending: Internal Medicine | Admitting: Internal Medicine

## 2016-11-13 DIAGNOSIS — N189 Chronic kidney disease, unspecified: Secondary | ICD-10-CM

## 2016-11-13 DIAGNOSIS — D649 Anemia, unspecified: Secondary | ICD-10-CM | POA: Insufficient documentation

## 2016-11-13 DIAGNOSIS — D631 Anemia in chronic kidney disease: Secondary | ICD-10-CM

## 2016-11-13 MED ORDER — EPOETIN ALFA 10000 UNIT/ML IJ SOLN
INTRAMUSCULAR | Status: AC
  Administered 2016-11-13: 08:00:00 10000 [IU] via SUBCUTANEOUS
  Filled 2016-11-13: qty 1

## 2016-11-13 MED ORDER — EPOETIN ALFA 10000 UNIT/ML IJ SOLN: 30000.0000 [IU] | INTRAMUSCULAR | Status: DC

## 2016-11-13 MED ORDER — EPOETIN ALFA 20000 UNIT/ML IJ SOLN
INTRAMUSCULAR | Status: AC
  Administered 2016-11-13: 08:00:00 20000 [IU] via SUBCUTANEOUS
  Filled 2016-11-13: qty 1

## 2016-11-14 LAB — POCT HEMOGLOBIN-HEMACUE: HEMOGLOBIN: 10.8 g/dL — AB (ref 12.0–15.0)

## 2016-12-05 ENCOUNTER — Encounter: Payer: Self-pay | Admitting: *Deleted

## 2016-12-11 ENCOUNTER — Encounter (HOSPITAL_COMMUNITY)
Admission: RE | Admit: 2016-12-11 | Discharge: 2016-12-11 | Disposition: A | Discharge status: Home / Self Care | Payer: Medicare Other | Source: Ambulatory Visit | Attending: Internal Medicine | Admitting: Internal Medicine

## 2016-12-11 DIAGNOSIS — D649 Anemia, unspecified: Secondary | ICD-10-CM | POA: Diagnosis not present

## 2016-12-11 DIAGNOSIS — N189 Chronic kidney disease, unspecified: Secondary | ICD-10-CM

## 2016-12-11 DIAGNOSIS — D631 Anemia in chronic kidney disease: Secondary | ICD-10-CM

## 2016-12-11 LAB — POCT HEMOGLOBIN-HEMACUE: Hemoglobin: 10.3 g/dL — ABNORMAL LOW (ref 12.0–15.0)

## 2016-12-11 MED ORDER — EPOETIN ALFA 10000 UNIT/ML IJ SOLN
INTRAMUSCULAR | Status: AC
  Administered 2016-12-11: 10000 [IU] via SUBCUTANEOUS
  Filled 2016-12-11: qty 1

## 2016-12-11 MED ORDER — EPOETIN ALFA 10000 UNIT/ML IJ SOLN: 30000.0000 [IU] | INTRAMUSCULAR | Status: DC

## 2016-12-11 MED ORDER — EPOETIN ALFA 20000 UNIT/ML IJ SOLN
INTRAMUSCULAR | Status: AC
  Administered 2016-12-11: 08:00:00 20000 [IU] via SUBCUTANEOUS
  Filled 2016-12-11: qty 1

## 2016-12-19 ENCOUNTER — Encounter: Payer: Self-pay | Admitting: Nurse Practitioner

## 2016-12-19 ENCOUNTER — Ambulatory Visit (INDEPENDENT_AMBULATORY_CARE_PROVIDER_SITE_OTHER): Payer: Medicare Other | Admitting: Nurse Practitioner

## 2016-12-19 VITALS — BP 148/84 | HR 84 | Ht 62.0 in | Wt 157.0 lb

## 2016-12-19 DIAGNOSIS — I472 Ventricular tachycardia, unspecified: Secondary | ICD-10-CM

## 2016-12-19 DIAGNOSIS — R001 Bradycardia, unspecified: Secondary | ICD-10-CM | POA: Diagnosis not present

## 2016-12-19 DIAGNOSIS — I10 Essential (primary) hypertension: Secondary | ICD-10-CM

## 2016-12-19 DIAGNOSIS — Z79899 Other long term (current) drug therapy: Secondary | ICD-10-CM

## 2016-12-19 DIAGNOSIS — I519 Heart disease, unspecified: Secondary | ICD-10-CM

## 2016-12-19 LAB — BASIC METABOLIC PANEL WITH GFR
BUN/Creatinine Ratio: 13 (ref 12–28)
BUN: 21 mg/dL (ref 8–27)
CO2: 17 mmol/L — ABNORMAL LOW (ref 20–29)
Calcium: 8.9 mg/dL (ref 8.7–10.3)
Chloride: 105 mmol/L (ref 96–106)
Creatinine, Ser: 1.68 mg/dL — ABNORMAL HIGH (ref 0.57–1.00)
GFR calc Af Amer: 31 mL/min/1.73 — ABNORMAL LOW
GFR calc non Af Amer: 27 mL/min/1.73 — ABNORMAL LOW
Glucose: 178 mg/dL — ABNORMAL HIGH (ref 65–99)
Potassium: 4.2 mmol/L (ref 3.5–5.2)
Sodium: 140 mmol/L (ref 134–144)

## 2016-12-19 NOTE — Patient Instructions (Addendum)
We will be checking the following labs today - BMET   Medication Instructions:    Continue with your current medicines.     Testing/Procedures To Be Arranged:  N/A  Follow-Up:   See me in 4 months with labs and EKG    Other Special Instructions:   Try to drink more water! Less soda.     If you need a refill on your cardiac medications before your next appointment, please call your pharmacy.   Call the [place] office at [phone] if you have any questions, problems or concerns.

## 2016-12-19 NOTE — Progress Notes (Signed)
CARDIOLOGY OFFICE NOTE  Date:  12/19/2016    [name] Date of Birth: 1928/11/18 Medical Record #[id number]  PCP:  Burtis Junes, NP  Cardiologist:  Servando Snare Allred    Chief Complaint  Patient presents with  . Follow-up    1 month check after med change - seen for Dr. Rayann Heman    History of Present Illness: Leslie Coleman is a 83 y.o. female who presents today for a follow up visit. Seen for Dr. Rayann Heman - former patient of Dr. Susa Simmonds.   She has a remote history of VT - treated initially with procainamide but switched to amiodarone back in 2008 due to unavailability of the medicine. Does have chronically abnormal PFTs. Other issues include HTN, gout, obesity, DM and anemia. On chronic Procrit.   I saw her back in September &October of 2015 - seemed to be getting feeble. Had a cold. Chronic DOE noted. More swelling. Weight was up. I added Lasix for a few days and she ended up taking just prn. Cut her atenolol back due to bradycardia and updated her labs and echo. TSH was elevated and we increased her thyroid medicine. She saw Dr. Rayann Heman in June of 2016 and was felt to be stable.   She was in the ER back in May of 2017 - was dizzy. Looks like she was treated with IVF and antibiotics. Her HR was 42 by EKG - this was notaddressed upon my review. I then saw her in [place] her regular check - she remained bradycardic - she was asymptomatic - I stopped her Atenolol. Last visit with me was back in November and she was doing well. Cardiac status seemed ok. She had lost a significant amount of weight - supposedly intentional.   Several phone calls this past Februaryto discuss medicines. Had been at some rehab facility after falling and breaking her leg - medicines very difficult to clarify - she was asked to come in and bring all her bottles and instructions with her so we could straighten out. I then saw her - she had multiple lists, very confused overall about what she was  taking. Daughter did not seem to know what she was taking as well. I attempted to straighten this out. Last seen by me back in March and seemed better.   Hospitalized in May with dizziness - thought to be dehydrated with possible UTI. She was rehydrated. Lasix and ARB held. She was not orthostatic. I then saw her last month - she was doing ok - had stopped driving. Not dizzy but not drinking enough water - loves soda instead and apparently drinks a massive quantity. She uses "[facility]" and her lasix and ARB had not been taken out - thus she was still taking. Family was to have these medicines withdrawn.   Comes in today. Here with family today. She remains in a wheelchair. Doing ok with no real complaint.  Not dizzy or lightheaded. No falls. Pretty sedentary. No chest pain. Breathing is ok. No real swelling. Still drinking too much soda. Off diuretic and ARB now for sure. Needs repeat lab today. Overall seems to be doing ok.   Past Medical History:  Diagnosis Date  . Abnormal PFTs (pulmonary function tests) 2008   No obstruction noted. Has been followed clinically.  . Anemia    treated with Procrit  . Fall from slip, trip, or stumble 05/07/2016   [place] resulting in right [place] plateau fracture  .  Gout   . High risk medication use    on amiodarone.  Marland Kitchen HTN (hypertension)    Last echo in 2004; EF normal  . Hyperlipidemia   . Hypothyroidism    on replacement  . Irregular heart beat "since 1985"  . Obesity   . Type II diabetes mellitus ([place])   . V-tach [facility])    remote VT, treated initially with procainamide, on amiodarone since 2008    Past Surgical History:  Procedure Laterality Date  . CARDIOVASCULAR STRESS TEST  07/07/2003   EF 70%. NO EVIDENCE OF ISCHEMIA  . CARDIOVERSION  1978  . CATARACT EXTRACTION, BILATERAL Bilateral   . CATARACT EXTRACTION, BILATERAL Bilateral   . CESAREAN SECTION  1957; 1961; 1967  . FRACTURE SURGERY    . ORIF TIBIA PLATEAU Right 05/10/2016    Procedure: OPEN REDUCTION INTERNAL FIXATION (ORIF) RIGHT TIBIAL PLATEAU  WITH BONE GRAFT;  Surgeon: Leandrew Koyanagi, MD;  Location: [facility];  Service: Orthopedics;  Laterality: Right;  . US ECHOCARDIOGRAPHY  12/29/2002   EF 60-65%  . VAGINAL HYSTERECTOMY       Medications: Current Meds  Medication Sig  . amiodarone (PACERONE) 100 MG tablet Take 1 tablet (100 mg total) by mouth every morning.  Marland Kitchen amLODipine (NORVASC) 5 MG tablet Take 1 tablet (5 mg total) by mouth daily.  Marland Kitchen aspirin 81 MG EC tablet Take 1 tablet (81 mg total) by mouth daily.  Marland Kitchen Epoetin Alfa (PROCRIT IJ) Inject 10,000 Units as directed every 28 (twenty-eight) days.   Marland Kitchen glipiZIDE (GLUCOTROL XL) 5 MG 24 hr tablet Take 5 mg by mouth daily.  . hydrALAZINE (APRESOLINE) 50 MG tablet TAKE 1 TABLET BY MOUTH TWICE A DAY  . levothyroxine (SYNTHROID, LEVOTHROID) 100 MCG tablet Take 1 tablet (100 mcg total) by mouth daily before breakfast.  . Melatonin 5 MG CAPS Take 5 mg by mouth daily.  . Multiple Vitamin (MULTIVITAMIN WITH MINERALS) TABS tablet Take 1 tablet by mouth daily.  Marland Kitchen nystatin (MYCOSTATIN/NYSTOP) powder Apply topically 3 (three) times daily.  Marland Kitchen omega-3 acid ethyl esters (LOVAZA) 1 g capsule TAKE 2 CAPSULES BY MOUTH 2 TIMES DAILY.  Marland Kitchen potassium chloride (K-DUR) 10 MEQ tablet Take 10 mEq by mouth every morning.   . pravastatin (PRAVACHOL) 20 MG tablet TAKE 1 TABLET BY MOUTH AT BEDTIME     Allergies: No Known Allergies  Social History: The patient  reports that she has never smoked. She has never used smokeless tobacco. She reports that she does not drink alcohol or use drugs.   Family History: The patient's family history includes Heart disease in her father and mother.   Review of Systems: Please see the history of present illness.   Otherwise, the review of systems is positive for none.   All other systems are reviewed and negative.   Physical Exam: VS:  BP (!) 148/84 (BP Location: Left Arm, Patient Position: Sitting, Cuff  Size: Large)   Pulse 84   Ht 5\' 2"  (1.575 m)   Wt 157 lb (71.2 kg)   SpO2 98% Comment: at rest  BMI 28.72 kg/m  .  BMI Body mass index is 28.72 kg/m.  Wt Readings from Last 3 Encounters:  12/19/16 157 lb (71.2 kg)  11/07/16 153 lb 12.8 oz (69.8 kg)  10/19/16 154 lb 12.8 oz (70.2 kg)   BP by me is 140/80  General: Pleasant. Elderly female who is alert and in no acute distress.   HEENT: Normal.  Neck: Supple, no JVD,  carotid bruits, or masses noted.  Cardiac: Regular rate and rhythm. No murmurs, rubs, or gallops. No edema.  Respiratory:  Lungs are clear to auscultation bilaterally with normal work of breathing.  GI: Soft and nontender.  MS: No deformity or atrophy. Gait not tested.  Skin: Warm and dry. Color always sallow Neuro:  Strength and sensation are intact and no gross focal deficits noted.  Psych: Alert, appropriate and with normal affect.   LABORATORY DATA:  EKG:  EKG is not ordered today.  Lab Results  Component Value Date   WBC 5.7 10/20/2016   HGB 10.3 (L) 12/11/2016   HCT 28.9 (L) 10/20/2016   PLT 142 (L) 10/20/2016   GLUCOSE 84 10/20/2016   CHOL 106 04/04/2016   TRIG 210 (H) 04/04/2016   HDL 21 (L) 04/04/2016   LDLCALC 43 04/04/2016   ALT 12 (L) 10/20/2016   AST 20 10/20/2016   NA 140 10/20/2016   K 3.2 (L) 10/20/2016   CL 107 10/20/2016   CREATININE 2.16 (H) 10/20/2016   BUN 28 (H) 10/20/2016   CO2 24 10/20/2016   TSH 0.544 10/19/2016   INR 1.11 06/18/2014   HGBA1C 6.0 (H) 10/19/2016     BNP (last 3 results) No results for input(s): BNP in the last 8760 hours.  ProBNP (last 3 results) No results for input(s): PROBNP in the last 8760 hours.   Other Studies Reviewed Today:  Echo Study Conclusions from September 2015  - Left ventricle: The cavity size was normal. Wall thickness was normal. Systolic function was normal. The estimated ejection fraction was in the range of 60% to 65%. Left ventricular diastolic function parameters were  normal. - Mitral valve: There was mild regurgitation. - Left atrium: The atrium was mildly dilated. - Right atrium: The atrium was mildly dilated.  Assessment / Plan:  1. Prior admission for dehydration and UTI - now off diuretic and ARB - recheck lab today.   2. HTN - BP is fair - I have left her on her current regimen.   3. History of VT - maintained on chronic low dose amiodarone therapy - was quite bradycardic at prior visit back in the summer- stopped her Atenolol - this has improved.    4. Asymptomatic bradycardia - resolved with cessation of beta blocker.   5. HLD - on statin therapy - lab on return.   6. Mild LV dysfunction - most recent echo showing normal EF. She does not seem to be symptomatic. Would manage conservatively regardless.   7. Hypothyoidism - some inconsistencies in the past due to medication adherence. Most recent TSH ok. Recheck on return.   8. History of abnormal PFTs - just followed clinically - fortunately, she her shortness of breath seems stable. Not really noted today.   Current medicines are reviewed with the patient today.  The patient does not have concerns regarding medicines other than what has been noted above.  The following changes have been made:  See above.  Labs/ tests ordered today include:    Orders Placed This Encounter  Procedures  . Basic metabolic panel     Disposition:   FU with me in 4 months with labs and EKG   Patient is agreeable to this plan and will call if any problems develop in the interim.   SignedTruitt Merle, NP  12/19/2016 8:16 AM  [facility] [address] [place], [place]  [address] Phone: [phone] Fax: [phone]

## 2017-01-08 ENCOUNTER — Encounter (HOSPITAL_COMMUNITY): Payer: Medicare Other

## 2017-01-15 ENCOUNTER — Other Ambulatory Visit (HOSPITAL_COMMUNITY): Payer: Self-pay | Admitting: *Deleted

## 2017-01-16 ENCOUNTER — Encounter (HOSPITAL_COMMUNITY)
Admission: RE | Admit: 2017-01-16 | Discharge: 2017-01-16 | Disposition: A | Discharge status: Home / Self Care | Payer: Medicare Other | Source: Ambulatory Visit | Attending: Internal Medicine | Admitting: Internal Medicine

## 2017-01-16 DIAGNOSIS — D649 Anemia, unspecified: Secondary | ICD-10-CM | POA: Insufficient documentation

## 2017-01-16 DIAGNOSIS — D631 Anemia in chronic kidney disease: Secondary | ICD-10-CM

## 2017-01-16 DIAGNOSIS — N189 Chronic kidney disease, unspecified: Secondary | ICD-10-CM

## 2017-01-16 LAB — POCT HEMOGLOBIN-HEMACUE: HEMOGLOBIN: 10.6 g/dL — AB (ref 12.0–15.0)

## 2017-01-16 MED ORDER — EPOETIN ALFA 10000 UNIT/ML IJ SOLN
INTRAMUSCULAR | Status: AC
  Administered 2017-01-16: 10000 [IU] via SUBCUTANEOUS
  Filled 2017-01-16: qty 1

## 2017-01-16 MED ORDER — EPOETIN ALFA 10000 UNIT/ML IJ SOLN
30000.0000 [IU] | INTRAMUSCULAR | Status: DC
Start: 2017-01-16 — End: 2017-01-17

## 2017-01-16 MED ORDER — EPOETIN ALFA 20000 UNIT/ML IJ SOLN
INTRAMUSCULAR | Status: AC
  Administered 2017-01-16: 20000 [IU] via SUBCUTANEOUS
  Filled 2017-01-16: qty 1

## 2017-02-13 ENCOUNTER — Encounter (HOSPITAL_COMMUNITY)
Admission: RE | Admit: 2017-02-13 | Discharge: 2017-02-13 | Disposition: A | Discharge status: Home / Self Care | Payer: Medicare Other | Source: Ambulatory Visit | Attending: Internal Medicine | Admitting: Internal Medicine

## 2017-02-13 DIAGNOSIS — D649 Anemia, unspecified: Secondary | ICD-10-CM | POA: Insufficient documentation

## 2017-02-13 DIAGNOSIS — D631 Anemia in chronic kidney disease: Secondary | ICD-10-CM

## 2017-02-13 DIAGNOSIS — N189 Chronic kidney disease, unspecified: Secondary | ICD-10-CM

## 2017-02-13 LAB — POCT HEMOGLOBIN-HEMACUE: Hemoglobin: 10.4 g/dL — ABNORMAL LOW (ref 12.0–15.0)

## 2017-02-13 MED ORDER — EPOETIN ALFA 10000 UNIT/ML IJ SOLN: 30000.0000 [IU] | INTRAMUSCULAR | Status: DC

## 2017-02-13 MED ORDER — EPOETIN ALFA 20000 UNIT/ML IJ SOLN
INTRAMUSCULAR | Status: AC
  Administered 2017-02-13: 20000 [IU] via SUBCUTANEOUS
  Filled 2017-02-13: qty 1

## 2017-02-13 MED ORDER — EPOETIN ALFA 10000 UNIT/ML IJ SOLN
INTRAMUSCULAR | Status: AC
  Administered 2017-02-13: 08:00:00 10000 [IU] via SUBCUTANEOUS
  Filled 2017-02-13: qty 1

## 2017-03-13 ENCOUNTER — Encounter (HOSPITAL_COMMUNITY)
Admission: RE | Admit: 2017-03-13 | Discharge: 2017-03-13 | Disposition: A | Discharge status: Home / Self Care | Payer: Medicare Other | Source: Ambulatory Visit | Attending: Internal Medicine | Admitting: Internal Medicine

## 2017-03-13 DIAGNOSIS — D631 Anemia in chronic kidney disease: Secondary | ICD-10-CM

## 2017-03-13 DIAGNOSIS — D649 Anemia, unspecified: Secondary | ICD-10-CM | POA: Insufficient documentation

## 2017-03-13 DIAGNOSIS — N189 Chronic kidney disease, unspecified: Secondary | ICD-10-CM

## 2017-03-13 LAB — POCT HEMOGLOBIN-HEMACUE: Hemoglobin: 10.7 g/dL — ABNORMAL LOW (ref 12.0–15.0)

## 2017-03-13 MED ORDER — EPOETIN ALFA 10000 UNIT/ML IJ SOLN
INTRAMUSCULAR | Status: AC
  Administered 2017-03-13: 10000 [IU]
  Filled 2017-03-13: qty 1

## 2017-03-13 MED ORDER — EPOETIN ALFA 10000 UNIT/ML IJ SOLN: 30000.0000 [IU] | INTRAMUSCULAR | Status: DC

## 2017-03-13 MED ORDER — EPOETIN ALFA 20000 UNIT/ML IJ SOLN
INTRAMUSCULAR | Status: AC
  Administered 2017-03-13: 08:00:00 20000 [IU]
  Filled 2017-03-13: qty 1

## 2017-03-14 DIAGNOSIS — Z23 Encounter for immunization: Secondary | ICD-10-CM | POA: Diagnosis not present

## 2017-03-22 ENCOUNTER — Other Ambulatory Visit: Payer: Self-pay | Admitting: Nurse Practitioner

## 2017-04-10 ENCOUNTER — Encounter (HOSPITAL_COMMUNITY)
Admission: RE | Admit: 2017-04-10 | Discharge: 2017-04-10 | Disposition: A | Discharge status: Home / Self Care | Payer: Medicare Other | Source: Ambulatory Visit | Attending: Internal Medicine | Admitting: Internal Medicine

## 2017-04-10 VITALS — BP 158/83 | HR 76 | Temp 97.8°F | Resp 20

## 2017-04-10 DIAGNOSIS — D649 Anemia, unspecified: Secondary | ICD-10-CM | POA: Insufficient documentation

## 2017-04-10 DIAGNOSIS — D631 Anemia in chronic kidney disease: Secondary | ICD-10-CM

## 2017-04-10 DIAGNOSIS — N189 Chronic kidney disease, unspecified: Secondary | ICD-10-CM

## 2017-04-10 LAB — POCT HEMOGLOBIN-HEMACUE: Hemoglobin: 11.7 g/dL — ABNORMAL LOW (ref 12.0–15.0)

## 2017-04-10 MED ORDER — EPOETIN ALFA 20000 UNIT/ML IJ SOLN
INTRAMUSCULAR | Status: AC
  Filled 2017-04-10: qty 1

## 2017-04-10 MED ORDER — EPOETIN ALFA 10000 UNIT/ML IJ SOLN: 30000.0000 [IU] | INTRAMUSCULAR | Status: DC

## 2017-04-10 MED ORDER — EPOETIN ALFA 10000 UNIT/ML IJ SOLN
INTRAMUSCULAR | Status: AC
  Filled 2017-04-10: qty 1

## 2017-04-21 ENCOUNTER — Other Ambulatory Visit: Payer: Self-pay | Admitting: Nurse Practitioner

## 2017-04-23 NOTE — Progress Notes (Signed)
CARDIOLOGY OFFICE NOTE  Date:  04/24/2017    Leslie Coleman Date of Birth: [birth year] Medical Record #[id number]  PCP:  Burtis Junes, NP  Cardiologist:  Servando Snare Allred    Chief Complaint  Patient presents with  . Irregular Heart Beat  . Hypertension    Follow up visit - seen for Dr. Rayann Heman    History of Present Illness: Leslie Coleman is a 83 y.o. female who presents today for a follow up visit. Seen for Dr. Rayann Heman - former patient of Dr. Susa Simmonds.   She has a remote history of VT - treated initially with procainamide but switched to amiodarone back in 2008 due to unavailability of the medicine. Does have chronically abnormal PFTs. Other issues include HTN, gout, obesity, DM and anemia. On chronic Procrit.   I saw her back in September &October of 2015 - seemed to be getting feeble. Had a cold. Chronic DOE noted. More swelling. Weight was up. I added Lasix for a few days and she ended up taking just prn. Cut her atenolol back due to bradycardia and updated her labs and echo. TSH was elevated and we increased her thyroid medicine. She saw Dr. Rayann Heman in June of 2016 and was felt to be stable.   She was in the ER back in May of 2017 - was dizzy. Looks like she was treated with IVF and antibiotics. Her HR was 42 by EKG - this was notaddressed upon my review. I then saw her in [place] her regular check - she remained bradycardic - she was asymptomatic - I stopped her Atenolol. Last visit with me was back in November and she was doing well. Cardiac status seemed ok. She had lost a significant amount of weight - supposedly intentional.   Several phone calls this pastFebruaryto discuss medicines. Had been at some rehab facility after falling and breaking her leg - medicines very difficult to clarify - she was asked to come in and bring all her bottles and instructions with her so we could straighten out. I then saw her - she had multiple lists, very confused overall about  what she was taking. Daughter did not seem to know what she was taking as well. I attempted to straighten this out.   Hospitalized in May with dizziness - thought to be dehydrated with possible UTI. She was rehydrated. Lasix and ARB held. She was not orthostatic. I then saw her for her post hospital check - she was doing ok - had stopped driving. Not dizzy but not drinking enough water - loves soda instead and apparently drinks a "assive quantity" according to the family. She uses "[facility]" and her lasix and ARB had not been taken out - thus she was still taking. Family was to have these medicines withdrawn. Last seen in July - no real complaint. Was off her diuretic and ARB.   Comes in today. Here with family today. She seems to be doing ok. Little cognitive decline. Still drinking massive amounts of soda. Says she does not eat. No chest pain. Not short of breath. BP is up. More swelling. Does not check BP at home. No falls. Family feels like she is doing ok. No real concerns. She is quite sedentary. Sits most of the day.   Past Medical History:  Diagnosis Date  . Abnormal PFTs (pulmonary function tests) 2008   No obstruction noted. Has been followed clinically.  . Anemia    treated with Procrit  .  Fall from slip, trip, or stumble 05/07/2016   [place] in [place] plateau fracture  . Gout   . High risk medication use    on amiodarone.  Marland Kitchen HTN (hypertension)    Last echo in 2004; EF normal  . Hyperlipidemia   . Hypothyroidism    on replacement  . Irregular heart beat "since 1985"  . Obesity   . Type II diabetes mellitus ([place])   . V-tach [facility])    remote VT, treated initially with procainamide, on amiodarone since 2008    Past Surgical History:  Procedure Laterality Date  . CARDIOVASCULAR STRESS TEST  07/07/2003   EF 70%. NO EVIDENCE OF ISCHEMIA  . CARDIOVERSION  1978  . CATARACT EXTRACTION, BILATERAL Bilateral   . CATARACT EXTRACTION, BILATERAL Bilateral   .  CESAREAN SECTION  1957; 1961; 1967  . FRACTURE SURGERY    . ORIF TIBIA PLATEAU Right 05/10/2016   Procedure: OPEN REDUCTION INTERNAL FIXATION (ORIF) RIGHT TIBIAL PLATEAU  WITH BONE GRAFT;  Surgeon: Leandrew Koyanagi, MD;  Location: [facility];  Service: Orthopedics;  Laterality: Right;  . US ECHOCARDIOGRAPHY  12/29/2002   EF 60-65%  . VAGINAL HYSTERECTOMY       Medications: Current Meds  Medication Sig  . amiodarone (PACERONE) 100 MG tablet Take 1 tablet (100 mg total) by mouth every morning.  Marland Kitchen amLODipine (NORVASC) 5 MG tablet Take 1 tablet (5 mg total) by mouth daily.  Marland Kitchen aspirin 81 MG EC tablet Take 1 tablet (81 mg total) by mouth daily.  Marland Kitchen Epoetin Alfa (PROCRIT IJ) Inject 10,000 Units as directed every 28 (twenty-eight) days.   Marland Kitchen glipiZIDE (GLUCOTROL XL) 5 MG 24 hr tablet Take 5 mg by mouth daily.  . hydrALAZINE (APRESOLINE) 50 MG tablet TAKE 1 TABLET BY MOUTH TWICE A DAY  . levothyroxine (SYNTHROID, LEVOTHROID) 100 MCG tablet Take 1 tablet (100 mcg total) by mouth daily before breakfast.  . Melatonin 5 MG CAPS Take 5 mg by mouth daily.  . Multiple Vitamin (MULTIVITAMIN WITH MINERALS) TABS tablet Take 1 tablet by mouth daily.  Marland Kitchen nystatin (MYCOSTATIN/NYSTOP) powder Apply topically 3 (three) times daily.  Marland Kitchen omega-3 acid ethyl esters (LOVAZA) 1 g capsule TAKE 2 CAPSULES BY MOUTH 2 TIMES DAILY.  Marland Kitchen potassium chloride (K-DUR) 10 MEQ tablet Take 10 mEq by mouth every morning.   . pravastatin (PRAVACHOL) 20 MG tablet TAKE 1 TABLET BY MOUTH AT BEDTIME     Allergies: No Known Allergies  Social History: The patient  reports that  has never smoked. she has never used smokeless tobacco. She reports that she does not drink alcohol or use drugs.   Family History: The patient's family history includes Heart disease in her father and mother.   Review of Systems: Please see the history of present illness.   Otherwise, the review of systems is positive for none.   All other systems are reviewed and  negative.   Physical Exam: VS:  BP (!) 160/88   Pulse 85   Ht 5\' 2"  (1.575 m)   Wt 164 lb (74.4 kg)   BMI 30.00 kg/m  .  BMI Body mass index is 30 kg/m.  Wt Readings from Last 3 Encounters:  04/24/17 164 lb (74.4 kg)  12/19/16 157 lb (71.2 kg)  11/07/16 153 lb 12.8 oz (69.8 kg)   BP recheck by me is 160/100  General: Pleasant. Elderly female. Alert and in no acute distress.   HEENT: Normal.  Neck: Supple, no  JVD, carotid bruits, or masses noted.  Cardiac: Regular rate and rhythm. No murmurs, rubs, or gallops. +1 edema.  Respiratory:  Lungs are clear to auscultation bilaterally with normal work of breathing.  GI: Soft and nontender.  MS: No deformity or atrophy. Gait not tested.  Skin: Warm and dry. Color is chronically sallow Neuro:  Strength and sensation are intact and no gross focal deficits noted.  Psych: Alert, appropriate and with normal affect.   LABORATORY DATA:  EKG:  EKG is ordered today. This demonstrates NSR QT is 377ms.  Lab Results  Component Value Date   WBC 5.7 10/20/2016   HGB 11.7 (L) 04/10/2017   HCT 28.9 (L) 10/20/2016   PLT 142 (L) 10/20/2016   GLUCOSE 178 (H) 12/19/2016   CHOL 106 04/04/2016   TRIG 210 (H) 04/04/2016   HDL 21 (L) 04/04/2016   LDLCALC 43 04/04/2016   ALT 12 (L) 10/20/2016   AST 20 10/20/2016   NA 140 12/19/2016   K 4.2 12/19/2016   CL 105 12/19/2016   CREATININE 1.68 (H) 12/19/2016   BUN 21 12/19/2016   CO2 17 (L) 12/19/2016   TSH 0.544 10/19/2016   INR 1.11 06/18/2014   HGBA1C 6.0 (H) 10/19/2016     BNP (last 3 results) No results for input(s): BNP in the last 8760 hours.  ProBNP (last 3 results) No results for input(s): PROBNP in the last 8760 hours.   Other Studies Reviewed Today:  Echo Study Conclusions from September 2015  - Left ventricle: The cavity size was normal. Wall thickness was normal. Systolic function was normal. The estimated ejection fraction was in the range of 60% to 65%. Left  ventricular diastolic function parameters were normal. - Mitral valve: There was mild regurgitation. - Left atrium: The atrium was mildly dilated. - Right atrium: The atrium was mildly dilated.  Assessment / Plan:  1. HTN - BP back up - weight is up - adding back low dose Lasix today - lab today. BMET in 2 weeks.   2. Prior admission for dehydration - was taken off ARB and diuretic therapy - adding back low dose Lasix today - see #1.   3. History of VT - maintained on chronic low dose amiodarone therapy - no longer on Atenolol due to bradycardia - EKG ok today - needs surveillance labs today.    4. Asymptomatic bradycardia - resolved with cessation of beta blocker.   5. HLD - on statin therapy - lab today  6. Mild LV dysfunction - most recent echo showing normal EF. She is not short of breath - has more swelling on exam and weight is up 7 pounds.  Adding back Lasix today - continue with conservative management.    7. Hypothyoidism - some inconsistencies in the past due to medication adherence. Most recent TSH ok. Rechecking today.    8. History of abnormal PFTs - just followed clinically - fortunately, she her shortness of breath seems stable. Not really noted today.   9. Advanced age    Current medicines are reviewed with the patient today.  The patient does not have concerns regarding medicines other than what has been noted above.  The following changes have been made:  See above.  Labs/ tests ordered today include:    Orders Placed This Encounter  Procedures  . Basic metabolic panel  . Hepatic function panel  . Lipid panel  . TSH  . Basic metabolic panel  . EKG 12-Lead     Disposition:  FU with me in 4 months.   Patient is agreeable to this plan and will call if any problems develop in the interim.   SignedTruitt Merle, NP  04/24/2017 8:52 AM  [facility] [address] [place], [place]  [address] Phone:  [phone] Fax: [phone]

## 2017-04-24 ENCOUNTER — Ambulatory Visit (INDEPENDENT_AMBULATORY_CARE_PROVIDER_SITE_OTHER): Payer: Medicare Other | Admitting: Nurse Practitioner

## 2017-04-24 ENCOUNTER — Encounter: Payer: Self-pay | Admitting: Nurse Practitioner

## 2017-04-24 VITALS — BP 160/88 | HR 85 | Ht 62.0 in | Wt 164.0 lb

## 2017-04-24 DIAGNOSIS — E7800 Pure hypercholesterolemia, unspecified: Secondary | ICD-10-CM | POA: Diagnosis not present

## 2017-04-24 DIAGNOSIS — I472 Ventricular tachycardia, unspecified: Secondary | ICD-10-CM

## 2017-04-24 DIAGNOSIS — I10 Essential (primary) hypertension: Secondary | ICD-10-CM | POA: Diagnosis not present

## 2017-04-24 DIAGNOSIS — I519 Heart disease, unspecified: Secondary | ICD-10-CM | POA: Diagnosis not present

## 2017-04-24 DIAGNOSIS — Z79899 Other long term (current) drug therapy: Secondary | ICD-10-CM | POA: Diagnosis not present

## 2017-04-24 DIAGNOSIS — E038 Other specified hypothyroidism: Secondary | ICD-10-CM

## 2017-04-24 DIAGNOSIS — R001 Bradycardia, unspecified: Secondary | ICD-10-CM

## 2017-04-24 LAB — BASIC METABOLIC PANEL
BUN/Creatinine Ratio: 12 (ref 12–28)
BUN: 19 mg/dL (ref 8–27)
CO2: 19 mmol/L — ABNORMAL LOW (ref 20–29)
Calcium: 9.2 mg/dL (ref 8.7–10.3)
Chloride: 105 mmol/L (ref 96–106)
Creatinine, Ser: 1.6 mg/dL — ABNORMAL HIGH (ref 0.57–1.00)
GFR calc Af Amer: 33 mL/min/{1.73_m2} — ABNORMAL LOW (ref >59)
GFR calc non Af Amer: 29 mL/min/{1.73_m2} — ABNORMAL LOW (ref >59)
Glucose: 242 mg/dL — ABNORMAL HIGH (ref 65–99)
Potassium: 4.1 mmol/L (ref 3.5–5.2)
Sodium: 140 mmol/L (ref 134–144)

## 2017-04-24 LAB — LIPID PANEL
Chol/HDL Ratio: 5 ratio — ABNORMAL HIGH (ref 0.0–4.4)
Cholesterol, Total: 111 mg/dL (ref 100–199)
HDL: 22 mg/dL — ABNORMAL LOW (ref >39)
LDL Calculated: 43 mg/dL (ref 0–99)
Triglycerides: 230 mg/dL — ABNORMAL HIGH (ref 0–149)
VLDL Cholesterol Cal: 46 mg/dL — ABNORMAL HIGH (ref 5–40)

## 2017-04-24 LAB — HEPATIC FUNCTION PANEL
ALT: 12 IU/L (ref 0–32)
AST: 19 IU/L (ref 0–40)
Albumin: 4.2 g/dL (ref 3.5–4.7)
Alkaline Phosphatase: 113 IU/L (ref 39–117)
Bilirubin Total: 0.3 mg/dL (ref 0.0–1.2)
Bilirubin, Direct: 0.1 mg/dL (ref 0.00–0.40)
Total Protein: 6.5 g/dL (ref 6.0–8.5)

## 2017-04-24 LAB — TSH: TSH: 2.59 u[IU]/mL (ref 0.450–4.500)

## 2017-04-24 MED ORDER — FUROSEMIDE 20 MG PO TABS: 20.0000 mg | ORAL_TABLET | Freq: Every day | ORAL | 3 refills | Status: DC

## 2017-04-24 NOTE — Patient Instructions (Addendum)
We will be checking the following labs today - BMET, HPF, TSH and lipids   Lab in 2 weeks - BMET 05/08/17 ANYTIME BETWEEN 7:30 AM AND 4:30 PM    Medication Instructions:    Continue with your current medicines. BUT  I am adding Lasix 20 mg back - one each morning - this was sent to [facility]  We will let [facility] know about this med change    Testing/Procedures To Be Arranged:  N/A  Follow-Up:   See me in 4 months with EKG & labs. 08/27/17 @ 9 AM     Other Special Instructions:   N/A    If you need a refill on your cardiac medications before your next appointment, please call your pharmacy.   Call the [place] office at [phone] if you have any questions, problems or concerns.

## 2017-05-08 ENCOUNTER — Other Ambulatory Visit: Payer: Medicare Other | Admitting: *Deleted

## 2017-05-08 ENCOUNTER — Encounter (HOSPITAL_COMMUNITY)
Admission: RE | Admit: 2017-05-08 | Discharge: 2017-05-08 | Disposition: A | Discharge status: Home / Self Care | Payer: Medicare Other | Source: Ambulatory Visit | Attending: Internal Medicine | Admitting: Internal Medicine

## 2017-05-08 VITALS — BP 127/69 | HR 80 | Temp 97.6°F | Resp 20

## 2017-05-08 DIAGNOSIS — Z79899 Other long term (current) drug therapy: Secondary | ICD-10-CM | POA: Diagnosis not present

## 2017-05-08 DIAGNOSIS — D649 Anemia, unspecified: Secondary | ICD-10-CM | POA: Insufficient documentation

## 2017-05-08 DIAGNOSIS — D631 Anemia in chronic kidney disease: Secondary | ICD-10-CM

## 2017-05-08 DIAGNOSIS — I10 Essential (primary) hypertension: Secondary | ICD-10-CM | POA: Diagnosis not present

## 2017-05-08 DIAGNOSIS — N189 Chronic kidney disease, unspecified: Secondary | ICD-10-CM

## 2017-05-08 LAB — BASIC METABOLIC PANEL
BUN/Creatinine Ratio: 14 (ref 12–28)
BUN: 24 mg/dL (ref 8–27)
CO2: 20 mmol/L (ref 20–29)
Calcium: 9 mg/dL (ref 8.7–10.3)
Chloride: 105 mmol/L (ref 96–106)
Creatinine, Ser: 1.73 mg/dL — ABNORMAL HIGH (ref 0.57–1.00)
GFR calc Af Amer: 30 mL/min/{1.73_m2} — ABNORMAL LOW (ref >59)
GFR calc non Af Amer: 26 mL/min/{1.73_m2} — ABNORMAL LOW (ref >59)
Glucose: 177 mg/dL — ABNORMAL HIGH (ref 65–99)
Potassium: 4.3 mmol/L (ref 3.5–5.2)
Sodium: 141 mmol/L (ref 134–144)

## 2017-05-08 LAB — POCT HEMOGLOBIN-HEMACUE: HEMOGLOBIN: 11 g/dL — AB (ref 12.0–15.0)

## 2017-05-08 MED ORDER — EPOETIN ALFA 20000 UNIT/ML IJ SOLN
INTRAMUSCULAR | Status: AC
  Filled 2017-05-08: qty 1

## 2017-05-08 MED ORDER — EPOETIN ALFA 10000 UNIT/ML IJ SOLN
INTRAMUSCULAR | Status: AC
  Filled 2017-05-08: qty 1

## 2017-05-08 MED ORDER — EPOETIN ALFA 10000 UNIT/ML IJ SOLN: 30000.0000 [IU] | INTRAMUSCULAR | Status: DC

## 2017-06-04 ENCOUNTER — Encounter (HOSPITAL_COMMUNITY): Payer: Medicare Other

## 2017-06-12 ENCOUNTER — Encounter (HOSPITAL_COMMUNITY)
Admission: RE | Admit: 2017-06-12 | Discharge: 2017-06-12 | Disposition: A | Discharge status: Home / Self Care | Payer: Medicare Other | Source: Ambulatory Visit | Attending: Internal Medicine | Admitting: Internal Medicine

## 2017-06-12 VITALS — BP 150/65 | HR 68 | Temp 97.5°F | Resp 20

## 2017-06-12 DIAGNOSIS — I1310 Hypertensive heart and chronic kidney disease without heart failure, with stage 1 through stage 4 chronic kidney disease, or unspecified chronic kidney disease: Secondary | ICD-10-CM | POA: Insufficient documentation

## 2017-06-12 DIAGNOSIS — N189 Chronic kidney disease, unspecified: Secondary | ICD-10-CM | POA: Insufficient documentation

## 2017-06-12 DIAGNOSIS — D631 Anemia in chronic kidney disease: Secondary | ICD-10-CM | POA: Diagnosis not present

## 2017-06-12 DIAGNOSIS — D649 Anemia, unspecified: Secondary | ICD-10-CM | POA: Diagnosis present

## 2017-06-12 LAB — POCT HEMOGLOBIN-HEMACUE: HEMOGLOBIN: 10.8 g/dL — AB (ref 12.0–15.0)

## 2017-06-12 MED ORDER — EPOETIN ALFA 20000 UNIT/ML IJ SOLN
INTRAMUSCULAR | Status: AC
  Administered 2017-06-12: 20000 [IU] via SUBCUTANEOUS
  Filled 2017-06-12: qty 1

## 2017-06-12 MED ORDER — EPOETIN ALFA 10000 UNIT/ML IJ SOLN
INTRAMUSCULAR | Status: AC
  Administered 2017-06-12: 08:00:00 10000 [IU] via SUBCUTANEOUS
  Filled 2017-06-12: qty 1

## 2017-06-12 MED ORDER — EPOETIN ALFA 10000 UNIT/ML IJ SOLN
30000.0000 [IU] | INTRAMUSCULAR | Status: DC
Start: 2017-06-12 — End: 2017-06-13

## 2017-06-20 ENCOUNTER — Other Ambulatory Visit: Payer: Self-pay | Admitting: Nurse Practitioner

## 2017-07-09 ENCOUNTER — Other Ambulatory Visit (HOSPITAL_COMMUNITY): Payer: Self-pay | Admitting: *Deleted

## 2017-07-10 ENCOUNTER — Encounter (HOSPITAL_COMMUNITY)
Admission: RE | Admit: 2017-07-10 | Discharge: 2017-07-10 | Disposition: A | Discharge status: Home / Self Care | Payer: Medicare Other | Source: Ambulatory Visit | Attending: Internal Medicine | Admitting: Internal Medicine

## 2017-07-10 VITALS — BP 132/50 | HR 81 | Temp 98.1°F | Resp 16

## 2017-07-10 DIAGNOSIS — N189 Chronic kidney disease, unspecified: Secondary | ICD-10-CM | POA: Insufficient documentation

## 2017-07-10 DIAGNOSIS — D631 Anemia in chronic kidney disease: Secondary | ICD-10-CM

## 2017-07-10 DIAGNOSIS — D649 Anemia, unspecified: Secondary | ICD-10-CM | POA: Diagnosis present

## 2017-07-10 LAB — POCT HEMOGLOBIN-HEMACUE: Hemoglobin: 10.7 g/dL — ABNORMAL LOW (ref 12.0–15.0)

## 2017-07-10 MED ORDER — EPOETIN ALFA 10000 UNIT/ML IJ SOLN
INTRAMUSCULAR | Status: AC
  Administered 2017-07-10: 10000 [IU] via SUBCUTANEOUS
  Filled 2017-07-10: qty 1

## 2017-07-10 MED ORDER — EPOETIN ALFA 20000 UNIT/ML IJ SOLN
INTRAMUSCULAR | Status: AC
  Administered 2017-07-10: 20000 [IU] via SUBCUTANEOUS
  Filled 2017-07-10: qty 1

## 2017-07-10 MED ORDER — EPOETIN ALFA 10000 UNIT/ML IJ SOLN: 30000.0000 [IU] | INTRAMUSCULAR | Status: DC

## 2017-07-20 ENCOUNTER — Other Ambulatory Visit: Payer: Self-pay | Admitting: Nurse Practitioner

## 2017-08-07 ENCOUNTER — Encounter (HOSPITAL_COMMUNITY)
Admission: RE | Admit: 2017-08-07 | Discharge: 2017-08-07 | Disposition: A | Discharge status: Home / Self Care | Payer: Medicare Other | Source: Ambulatory Visit | Attending: Internal Medicine | Admitting: Internal Medicine

## 2017-08-07 VITALS — BP 147/69 | HR 74 | Temp 97.4°F | Resp 20

## 2017-08-07 DIAGNOSIS — D631 Anemia in chronic kidney disease: Secondary | ICD-10-CM | POA: Insufficient documentation

## 2017-08-07 DIAGNOSIS — N189 Chronic kidney disease, unspecified: Secondary | ICD-10-CM | POA: Insufficient documentation

## 2017-08-07 DIAGNOSIS — D649 Anemia, unspecified: Secondary | ICD-10-CM | POA: Diagnosis present

## 2017-08-07 LAB — POCT HEMOGLOBIN-HEMACUE: Hemoglobin: 11 g/dL — ABNORMAL LOW (ref 12.0–15.0)

## 2017-08-07 MED ORDER — EPOETIN ALFA 10000 UNIT/ML IJ SOLN
INTRAMUSCULAR | Status: AC
  Administered 2017-08-07: 10000 [IU] via SUBCUTANEOUS
  Filled 2017-08-07: qty 1

## 2017-08-07 MED ORDER — EPOETIN ALFA 20000 UNIT/ML IJ SOLN
INTRAMUSCULAR | Status: AC
  Administered 2017-08-07: 20000 [IU] via SUBCUTANEOUS
  Filled 2017-08-07: qty 1

## 2017-08-07 MED ORDER — EPOETIN ALFA 10000 UNIT/ML IJ SOLN: 30000.0000 [IU] | INTRAMUSCULAR | Status: DC

## 2017-08-27 ENCOUNTER — Encounter: Payer: Self-pay | Admitting: Nurse Practitioner

## 2017-08-27 ENCOUNTER — Ambulatory Visit (INDEPENDENT_AMBULATORY_CARE_PROVIDER_SITE_OTHER): Payer: Medicare Other | Admitting: Nurse Practitioner

## 2017-08-27 VITALS — BP 148/72 | HR 86 | Ht 62.0 in | Wt 169.8 lb

## 2017-08-27 DIAGNOSIS — I10 Essential (primary) hypertension: Secondary | ICD-10-CM | POA: Diagnosis not present

## 2017-08-27 DIAGNOSIS — Z79899 Other long term (current) drug therapy: Secondary | ICD-10-CM | POA: Diagnosis not present

## 2017-08-27 DIAGNOSIS — R001 Bradycardia, unspecified: Secondary | ICD-10-CM | POA: Diagnosis not present

## 2017-08-27 LAB — TSH: TSH: 1.1 u[IU]/mL (ref 0.450–4.500)

## 2017-08-27 LAB — CBC
Hematocrit: 32.3 % — ABNORMAL LOW (ref 34.0–46.6)
Hemoglobin: 10.5 g/dL — ABNORMAL LOW (ref 11.1–15.9)
MCH: 28.4 pg (ref 26.6–33.0)
MCHC: 32.5 g/dL (ref 31.5–35.7)
MCV: 87 fL (ref 79–97)
Platelets: 142 10*3/uL — ABNORMAL LOW (ref 150–379)
RBC: 3.7 x10E6/uL — ABNORMAL LOW (ref 3.77–5.28)
RDW: 15.7 % — ABNORMAL HIGH (ref 12.3–15.4)
WBC: 6.2 10*3/uL (ref 3.4–10.8)

## 2017-08-27 LAB — HEPATIC FUNCTION PANEL
ALT: 12 IU/L (ref 0–32)
AST: 20 IU/L (ref 0–40)
Albumin: 3.6 g/dL (ref 3.5–4.7)
Alkaline Phosphatase: 108 IU/L (ref 39–117)
Bilirubin Total: 0.2 mg/dL (ref 0.0–1.2)
Bilirubin, Direct: 0.09 mg/dL (ref 0.00–0.40)
Total Protein: 6.2 g/dL (ref 6.0–8.5)

## 2017-08-27 LAB — BASIC METABOLIC PANEL
BUN/Creatinine Ratio: 10 — ABNORMAL LOW (ref 12–28)
BUN: 22 mg/dL (ref 8–27)
CO2: 20 mmol/L (ref 20–29)
Calcium: 8.9 mg/dL (ref 8.7–10.3)
Chloride: 101 mmol/L (ref 96–106)
Creatinine, Ser: 2.14 mg/dL — ABNORMAL HIGH (ref 0.57–1.00)
GFR calc Af Amer: 23 mL/min/{1.73_m2} — ABNORMAL LOW (ref >59)
GFR calc non Af Amer: 20 mL/min/{1.73_m2} — ABNORMAL LOW (ref >59)
Glucose: 248 mg/dL — ABNORMAL HIGH (ref 65–99)
Potassium: 4.2 mmol/L (ref 3.5–5.2)
Sodium: 139 mmol/L (ref 134–144)

## 2017-08-27 NOTE — Progress Notes (Signed)
CARDIOLOGY OFFICE NOTE  Date:  08/27/2017    Leslie Coleman Date of Birth: January 05, 1929 Medical Record #[id number]  PCP:  Burtis Junes, NP  Cardiologist:  Servando Snare Allred    Chief Complaint  Patient presents with  . Follow-up    Seen for Dr. Rayann Heman    History of Present Illness: Leslie Coleman is a 84 y.o. female who presents today for a follow up visit. Seen for Dr. Rayann Heman - former patient of Dr. Susa Simmonds.   She has a remote history of VT - treated initially with procainamide but switched to amiodarone back in 2008 due to unavailability of the medicine. Does have chronically abnormal PFTs. Other issues include HTN, gout, obesity, DM and anemia. On chronic Procrit.   I saw her back in September &October of 2015 - seemed to be getting feeble. Had a cold. Chronic DOE noted. More swelling. Weight was up. I added Lasix for a few days and she ended up taking just prn. Cut her atenolol back due to bradycardia and updated her labs and echo. TSH was elevated and we increased her thyroid medicine. She saw Dr. Rayann Heman in June of 2016 and was felt to be stable.   She was in the ER back in May of 2017 - was dizzy. Looks like she was treated with IVF and antibiotics. Her HR was 42 by EKG - this was notaddressed upon my review. I then saw her in [place] her regular check - she remained bradycardic - she was asymptomatic - I stopped her Atenolol.  She had lost a significant amount of weight - supposedly intentional.   Several phone calls in Februaryof 2018 to discuss medicines. Had been at some rehab facility after falling and breaking her leg - medicines very difficult to clarify - she was asked to come in and bring all her bottles and instructions with her so we could straighten out. I then saw her - she had multiple lists, very confused overall about what she was taking. Daughter did not seem to know what she was taking as well. I attempted to straighten this out.   Hospitalizedin  Mayof 2018 with dizziness - thought to be dehydrated with possible UTI.She was rehydrated. Lasix and ARB held. She was not orthostatic.I then saw her for her post hospital check - she was doing ok - had stopped driving. Not dizzy butnotdrinking enough water- loves soda instead and apparently drinks a "assive quantity" according to the family. She uses "[facility]" and her lasix and ARB had not been taken out - thus she was still taking. Family was to have these medicines withdrawn.When seen in July - no real complaint. Was off her diuretic and ARB. Last visit with me was in December - was doing ok - still drinking massive amounts of soda. BP was up and I added back low dose Lasix.   Comes in today. Here with family today.Seems to be holding her own. Family notes more difficulty with being hard of hearing. No chest pain. Breathing is stable. She remains pretty sedentary. Still drinking massive amounts of soda. [place] last week - no injury. Medicines still by [facility].   Past Medical History:  Diagnosis Date  . Abnormal PFTs (pulmonary function tests) 2008   No obstruction noted. Has been followed clinically.  . Anemia    treated with Procrit  . Fall from slip, trip, or stumble 05/07/2016   [place] resulting in right tibial plateau fracture  .  Gout   . High risk medication use    on amiodarone.  Marland Kitchen HTN (hypertension)    Last echo in 2004; EF normal  . Hyperlipidemia   . Hypothyroidism    on replacement  . Irregular heart beat "since 1985"  . Obesity   . Type II diabetes mellitus ([place])   . V-tach [facility])    remote VT, treated initially with procainamide, on amiodarone since 2008    Past Surgical History:  Procedure Laterality Date  . CARDIOVASCULAR STRESS TEST  07/07/2003   EF 70%. NO EVIDENCE OF ISCHEMIA  . CARDIOVERSION  1978  . CATARACT EXTRACTION, BILATERAL Bilateral   . CATARACT EXTRACTION, BILATERAL Bilateral   . CESAREAN SECTION  1957; 1961; 1967  . FRACTURE SURGERY     . ORIF TIBIA PLATEAU Right 05/10/2016   Procedure: OPEN REDUCTION INTERNAL FIXATION (ORIF) RIGHT TIBIAL PLATEAU  WITH BONE GRAFT;  Surgeon: Leandrew Koyanagi, MD;  Location: [place];  Service: Orthopedics;  Laterality: Right;  . US ECHOCARDIOGRAPHY  12/29/2002   EF 60-65%  . VAGINAL HYSTERECTOMY       Medications: Current Meds  Medication Sig  . amiodarone (PACERONE) 100 MG tablet Take 1 tablet (100 mg total) by mouth every morning.  Marland Kitchen amLODipine (NORVASC) 5 MG tablet Take 1 tablet (5 mg total) by mouth daily.  Marland Kitchen aspirin 81 MG EC tablet Take 1 tablet (81 mg total) by mouth daily.  Marland Kitchen Epoetin Alfa (PROCRIT IJ) Inject 10,000 Units as directed every 28 (twenty-eight) days.   . furosemide (LASIX) 20 MG tablet Take 1 tablet (20 mg total) by mouth daily.  Marland Kitchen glipiZIDE (GLUCOTROL XL) 5 MG 24 hr tablet Take 5 mg by mouth daily.  . hydrALAZINE (APRESOLINE) 50 MG tablet TAKE 1 TABLET BY MOUTH TWICE A DAY  . levothyroxine (SYNTHROID, LEVOTHROID) 100 MCG tablet Take 1 tablet (100 mcg total) by mouth daily before breakfast.  . Melatonin 5 MG CAPS Take 5 mg by mouth daily.  . Multiple Vitamin (MULTIVITAMIN WITH MINERALS) TABS tablet Take 1 tablet by mouth daily.  Marland Kitchen nystatin (MYCOSTATIN/NYSTOP) powder Apply topically 3 (three) times daily.  Marland Kitchen omega-3 acid ethyl esters (LOVAZA) 1 g capsule Take 2 capsules by mouth twice daily.  . potassium chloride (K-DUR) 10 MEQ tablet Take 10 mEq by mouth every morning.   . pravastatin (PRAVACHOL) 20 MG tablet TAKE 1 TABLET BY MOUTH AT BEDTIME     Allergies: No Known Allergies  Social History: The patient  reports that she has never smoked. She has never used smokeless tobacco. She reports that she does not drink alcohol or use drugs.   Family History: The patient's family history includes Heart disease in her father and mother.   Review of Systems: Please see the history of present illness.   Otherwise, the review of systems is positive for none.   All other systems  are reviewed and negative.   Physical Exam: VS:  BP (!) 148/72 (BP Location: Left Arm, Patient Position: Sitting, Cuff Size: Large)   Pulse 86   Ht 5\' 2"  (1.575 m)   Wt 169 lb 12.8 oz (77 kg)   BMI 31.06 kg/m  .  BMI Body mass index is 31.06 kg/m.  Wt Readings from Last 3 Encounters:  08/27/17 169 lb 12.8 oz (77 kg)  04/24/17 164 lb (74.4 kg)  12/19/16 157 lb (71.2 kg)    General: Pleasant. Chronically ill. In a wheelchair. Alert and in no acute distress. Her weight is up  5 pounds.   HEENT: Normal.  Neck: Supple, no JVD, carotid bruits, or masses noted.  Cardiac: Regular rate and rhythm. No murmurs, rubs, or gallops. Trace edema.  Respiratory:  Lungs are clear to auscultation bilaterally with normal work of breathing.  GI: Soft and nontender.  MS: No deformity or atrophy. Gait not tested. Skin: Warm and dry. Color is normal.  Neuro:  Strength and sensation are intact and no gross focal deficits noted.  Psych: Alert, appropriate and with normal affect.   LABORATORY DATA:  EKG:  EKG is ordered today. This demonstrates NSR.  Lab Results  Component Value Date   WBC 5.7 10/20/2016   HGB 11.0 (L) 08/07/2017   HCT 28.9 (L) 10/20/2016   PLT 142 (L) 10/20/2016   GLUCOSE 177 (H) 05/08/2017   CHOL 111 04/24/2017   TRIG 230 (H) 04/24/2017   HDL 22 (L) 04/24/2017   LDLCALC 43 04/24/2017   ALT 12 04/24/2017   AST 19 04/24/2017   NA 141 05/08/2017   K 4.3 05/08/2017   CL 105 05/08/2017   CREATININE 1.73 (H) 05/08/2017   BUN 24 05/08/2017   CO2 20 05/08/2017   TSH 2.590 04/24/2017   INR 1.11 06/18/2014   HGBA1C 6.0 (H) 10/19/2016     BNP (last 3 results) No results for input(s): BNP in the last 8760 hours.  ProBNP (last 3 results) No results for input(s): PROBNP in the last 8760 hours.   Other Studies Reviewed Today:  Echo Study Conclusions from September 2015  - Left ventricle: The cavity size was normal. Wall thickness was normal. Systolic function was normal.  The estimated ejection fraction was in the range of 60% to 65%. Left ventricular diastolic function parameters were normal. - Mitral valve: There was mild regurgitation. - Left atrium: The atrium was mildly dilated. - Right atrium: The atrium was mildly dilated.  Assessment / Plan:  1.HTN - BP fair. No changes made today.   2. History of VT - maintained on chronic low dose amiodarone therapy - no longer on Atenolol due to bradycardia - EKG ok today - needs surveillance labs today.    3. Asymptomatic bradycardia - resolved with cessation of beta blocker. Would not restart.   4. HLD - on statin therapy.   5. Mild LV dysfunction - most recent echo showing normal EF. She is not short of breath - she has dependent edema from being sedentary.  I would favor conservative management. No changes made today.    6. Hypothyoidism - rechecking TSH today  7. History of abnormal PFTs - just followed clinically - fortunately, her shortness of breath seems stable. Not really noted or endorsed today.  Current medicines are reviewed with the patient today.  The patient does not have concerns regarding medicines other than what has been noted above.  The following changes have been made:  See above.  Labs/ tests ordered today include:    Orders Placed This Encounter  Procedures  . Basic metabolic panel  . CBC  . Hepatic function panel  . TSH  . EKG 12-Lead     Disposition:   FU with me in 4 months.   Patient is agreeable to this plan and will call if any problems develop in the interim.   SignedTruitt Merle, NP  08/27/2017 9:26 AM  [place] [address], [place]  [address] Phone: [phone] Fax: [phone]

## 2017-08-27 NOTE — Patient Instructions (Addendum)
We will be checking the following labs today - BMET, CBC, HPF and TSH   Medication Instructions:    Continue with your current medicines.     Testing/Procedures To Be Arranged:  N/A  Follow-Up:   See me in 4 months with labs    Other Special Instructions:   Would advise weaning off soda - drink more water!    If you need a refill on your cardiac medications before your next appointment, please call your pharmacy.   Call the [place] office at [phone] if you have any questions, problems or concerns.

## 2017-09-04 ENCOUNTER — Ambulatory Visit (HOSPITAL_COMMUNITY)
Admission: RE | Admit: 2017-09-04 | Discharge: 2017-09-04 | Disposition: A | Discharge status: Home / Self Care | Payer: Medicare Other | Source: Ambulatory Visit | Attending: Internal Medicine | Admitting: Internal Medicine

## 2017-09-04 VITALS — BP 155/65 | HR 78 | Temp 97.3°F | Resp 18

## 2017-09-04 DIAGNOSIS — D649 Anemia, unspecified: Secondary | ICD-10-CM | POA: Insufficient documentation

## 2017-09-04 DIAGNOSIS — N189 Chronic kidney disease, unspecified: Secondary | ICD-10-CM

## 2017-09-04 DIAGNOSIS — D631 Anemia in chronic kidney disease: Secondary | ICD-10-CM

## 2017-09-04 LAB — POCT HEMOGLOBIN-HEMACUE: HEMOGLOBIN: 11.2 g/dL — AB (ref 12.0–15.0)

## 2017-09-04 MED ORDER — EPOETIN ALFA 10000 UNIT/ML IJ SOLN
INTRAMUSCULAR | Status: AC
  Filled 2017-09-04: qty 1

## 2017-09-04 MED ORDER — EPOETIN ALFA 10000 UNIT/ML IJ SOLN: 30000.0000 [IU] | INTRAMUSCULAR | Status: DC

## 2017-09-04 MED ORDER — EPOETIN ALFA 20000 UNIT/ML IJ SOLN
INTRAMUSCULAR | Status: AC
  Filled 2017-09-04: qty 1

## 2017-09-18 ENCOUNTER — Other Ambulatory Visit: Payer: Self-pay | Admitting: Nurse Practitioner

## 2017-10-02 ENCOUNTER — Ambulatory Visit (HOSPITAL_COMMUNITY)
Admission: RE | Admit: 2017-10-02 | Discharge: 2017-10-02 | Disposition: A | Discharge status: Home / Self Care | Payer: Medicare Other | Source: Ambulatory Visit | Attending: Internal Medicine | Admitting: Internal Medicine

## 2017-10-02 VITALS — BP 153/87 | HR 85 | Resp 18

## 2017-10-02 DIAGNOSIS — D649 Anemia, unspecified: Secondary | ICD-10-CM | POA: Insufficient documentation

## 2017-10-02 DIAGNOSIS — D631 Anemia in chronic kidney disease: Secondary | ICD-10-CM

## 2017-10-02 DIAGNOSIS — N189 Chronic kidney disease, unspecified: Secondary | ICD-10-CM

## 2017-10-02 LAB — POCT HEMOGLOBIN-HEMACUE: HEMOGLOBIN: 11.3 g/dL — AB (ref 12.0–15.0)

## 2017-10-02 MED ORDER — EPOETIN ALFA 10000 UNIT/ML IJ SOLN: 30000.0000 [IU] | INTRAMUSCULAR | Status: DC

## 2017-10-18 ENCOUNTER — Other Ambulatory Visit: Payer: Self-pay | Admitting: Nurse Practitioner

## 2017-10-30 ENCOUNTER — Encounter (HOSPITAL_COMMUNITY)
Admission: RE | Admit: 2017-10-30 | Discharge: 2017-10-30 | Disposition: A | Discharge status: Home / Self Care | Payer: Medicare Other | Source: Ambulatory Visit | Attending: Internal Medicine | Admitting: Internal Medicine

## 2017-10-30 VITALS — BP 147/75 | HR 81 | Temp 98.0°F | Resp 20

## 2017-10-30 DIAGNOSIS — N184 Chronic kidney disease, stage 4 (severe): Secondary | ICD-10-CM | POA: Diagnosis not present

## 2017-10-30 DIAGNOSIS — I1310 Hypertensive heart and chronic kidney disease without heart failure, with stage 1 through stage 4 chronic kidney disease, or unspecified chronic kidney disease: Secondary | ICD-10-CM | POA: Diagnosis not present

## 2017-10-30 DIAGNOSIS — D649 Anemia, unspecified: Secondary | ICD-10-CM | POA: Diagnosis present

## 2017-10-30 DIAGNOSIS — D631 Anemia in chronic kidney disease: Secondary | ICD-10-CM | POA: Insufficient documentation

## 2017-10-30 DIAGNOSIS — N189 Chronic kidney disease, unspecified: Secondary | ICD-10-CM

## 2017-10-30 LAB — POCT HEMOGLOBIN-HEMACUE: HEMOGLOBIN: 10.9 g/dL — AB (ref 12.0–15.0)

## 2017-10-30 MED ORDER — EPOETIN ALFA 10000 UNIT/ML IJ SOLN: 30000.0000 [IU] | INTRAMUSCULAR | Status: DC

## 2017-10-30 MED ORDER — EPOETIN ALFA 20000 UNIT/ML IJ SOLN
INTRAMUSCULAR | Status: AC
  Administered 2017-10-30: 20000 [IU] via SUBCUTANEOUS
  Filled 2017-10-30: qty 1

## 2017-10-30 MED ORDER — EPOETIN ALFA 10000 UNIT/ML IJ SOLN
INTRAMUSCULAR | Status: AC
  Administered 2017-10-30: 10000 [IU] via SUBCUTANEOUS
  Filled 2017-10-30: qty 1

## 2017-11-16 ENCOUNTER — Ambulatory Visit (HOSPITAL_COMMUNITY)
Admission: EM | Admit: 2017-11-16 | Discharge: 2017-11-16 | Disposition: A | Discharge status: Home / Self Care | Payer: Medicare Other | Attending: Family Medicine | Admitting: Family Medicine

## 2017-11-16 ENCOUNTER — Encounter (HOSPITAL_COMMUNITY): Payer: Self-pay

## 2017-11-16 DIAGNOSIS — W19XXXA Unspecified fall, initial encounter: Secondary | ICD-10-CM | POA: Diagnosis not present

## 2017-11-16 DIAGNOSIS — S61411A Laceration without foreign body of right hand, initial encounter: Secondary | ICD-10-CM | POA: Diagnosis not present

## 2017-11-16 DIAGNOSIS — Z23 Encounter for immunization: Secondary | ICD-10-CM

## 2017-11-16 MED ORDER — ACETAMINOPHEN 325 MG PO TABS
ORAL_TABLET | ORAL | Status: AC
  Filled 2017-11-16: qty 3

## 2017-11-16 MED ORDER — TETANUS-DIPHTH-ACELL PERTUSSIS 5-2.5-18.5 LF-MCG/0.5 IM SUSP
0.5000 mL | Freq: Once | INTRAMUSCULAR | Status: AC
  Administered 2017-11-16: 0.5 mL via INTRAMUSCULAR

## 2017-11-16 MED ORDER — ACETAMINOPHEN 325 MG PO TABS
975.0000 mg | ORAL_TABLET | Freq: Once | ORAL | Status: AC
  Administered 2017-11-16: 975 mg via ORAL

## 2017-11-16 MED ORDER — TETANUS-DIPHTH-ACELL PERTUSSIS 5-2.5-18.5 LF-MCG/0.5 IM SUSP
INTRAMUSCULAR | Status: AC
  Filled 2017-11-16: qty 0.5

## 2017-11-16 NOTE — ED Provider Notes (Signed)
[facility]    CSN: [id number] Arrival date & time: 11/16/17  1507     History   Chief Complaint Chief Complaint  Patient presents with  . Laceration    right hand    HPI Leslie Coleman is a 84 y.o. female.   HPI  Patient is here today for injuries sustained in a fall at her home.  She went to check the mail and fell on her front porch.  She injured her right hand.  Uncertain what she hit.  She has what she describes as a "skin tear".  There was a home health nurse nearby and they wrapped it up for her.  Bleeding controlled with pressure. Patient has had falls in the past.  She is unsteady.  She is physically deconditioned due to age and illness.  She lives with her elderly husband.  She is brought in by her son-in-law. He is certain that she did not faint, become dizzy, or pass out.  No palpitations, chest pain, or change in her dyspnea. She did not hit her head.  She did not injure her arms and legs.  She is here only for the wound. She does get regular medical care.  She has heart disease, renal failure, anemia, diabetes and hypothyroidism.  She states her medical conditions are well controlled.  She is compliant with her medical care and her doctors. Uncertain last tetanus.   Past Medical History:  Diagnosis Date  . Abnormal PFTs (pulmonary function tests) 2008   No obstruction noted. Has been followed clinically.  . Anemia    treated with Procrit  . Fall from slip, trip, or stumble 05/07/2016   [place] resulting in right tibial plateau fracture  . Gout   . High risk medication use    on amiodarone.  Marland Kitchen HTN (hypertension)    Last echo in 2004; EF normal  . Hyperlipidemia   . Hypothyroidism    on replacement  . Irregular heart beat "since 1985"  . Obesity   . Type II diabetes mellitus ([place])   . V-tach [facility])    remote VT, treated initially with procainamide, on amiodarone since 2008    Patient Active Problem List   Diagnosis Date Noted  .  Acute kidney injury ([place]) 10/19/2016  . Dizziness 10/19/2016  . CKD (chronic kidney disease) stage 4, GFR 15-29 ml/min (HCC) 10/19/2016  . Hypothyroidism, adult 10/19/2016  . Abnormal urinalysis 10/19/2016  . Diabetes mellitus type 2, uncontrolled ([place]) 10/19/2016  . Anemia due to chronic kidney disease 10/19/2016  . Thrombocytopenia ([place]) 10/19/2016  . Candidal dermatitis 10/19/2016  . Stress incontinence 10/19/2016  . V-tach ([place]) 10/19/2016  . Pressure injury of skin 10/19/2016  . Diabetes mellitus with complication ([place])   . Urinary tract infection without hematuria   . CKD (chronic kidney disease), stage III ([place]) 05/20/2016  . Hypothyroidism 05/20/2016  . Closed fracture of right tibial plateau 05/12/2016  . Anemia in chronic kidney disease 12/16/2015  . Dizzy 07/07/2011  . Dehydration 07/07/2011  . Type II diabetes mellitus ([place]) 07/06/2011  . History of long-term treatment with high-risk medication 08/29/2010  . Abnormal PFTs (pulmonary function tests)   . History of ventricular tachycardia   . HTN (hypertension)   . Hyperlipidemia   . Gout     Past Surgical History:  Procedure Laterality Date  . CARDIOVASCULAR STRESS TEST  07/07/2003   EF 70%. NO EVIDENCE OF ISCHEMIA  . CARDIOVERSION  1978  . CATARACT EXTRACTION,  BILATERAL Bilateral   . CATARACT EXTRACTION, BILATERAL Bilateral   . CESAREAN SECTION  1957; 1961; 1967  . FRACTURE SURGERY    . ORIF TIBIA PLATEAU Right 05/10/2016   Procedure: OPEN REDUCTION INTERNAL FIXATION (ORIF) RIGHT TIBIAL PLATEAU  WITH BONE GRAFT;  Surgeon: Leandrew Koyanagi, MD;  Location: [place];  Service: Orthopedics;  Laterality: Right;  . US ECHOCARDIOGRAPHY  12/29/2002   EF 60-65%  . VAGINAL HYSTERECTOMY      OB History   None      Home Medications    Prior to Admission medications   Medication Sig Start Date End Date Taking? Authorizing Provider  amiodarone (PACERONE) 100 MG tablet Take 1 tablet (100 mg total) by mouth every morning.  09/18/17   Burtis Junes, NP  amLODipine (NORVASC) 5 MG tablet Take 1 tablet (5 mg total) by mouth daily. 07/20/17 10/18/17  Burtis Junes, NP  aspirin 81 MG EC tablet Take 1 tablet (81 mg total) by mouth daily. 07/20/17   Burtis Junes, NP  Epoetin Alfa (PROCRIT IJ) Inject 10,000 Units as directed every 28 (twenty-eight) days.     [provider]  furosemide (LASIX) 20 MG tablet Take 1 tablet (20 mg total) by mouth daily. 04/24/17 08/27/17  Burtis Junes, NP  glipiZIDE (GLUCOTROL XL) 5 MG 24 hr tablet Take 5 mg by mouth daily. 04/03/16   [provider]  hydrALAZINE (APRESOLINE) 50 MG tablet TAKE 1 TABLET BY MOUTH TWICE A DAY 04/24/16   Allred, Jeneen Rinks, MD  levothyroxine (SYNTHROID, LEVOTHROID) 100 MCG tablet Take 1 tablet (100 mcg total) by mouth daily before breakfast. 10/18/17   Burtis Junes, NP  Melatonin 5 MG CAPS Take 5 mg by mouth daily.    [provider]  Multiple Vitamin (MULTIVITAMIN WITH MINERALS) TABS tablet Take 1 tablet by mouth daily.    [provider]  nystatin (MYCOSTATIN/NYSTOP) powder Apply topically 3 (three) times daily. 10/20/16   Rama, Venetia Maxon, MD  omega-3 acid ethyl esters (LOVAZA) 1 g capsule Take 2 capsules by mouth twice daily. 06/20/17   Burtis Junes, NP  potassium chloride (K-DUR) 10 MEQ tablet Take 10 mEq by mouth every morning.     [provider]  pravastatin (PRAVACHOL) 20 MG tablet TAKE 1 TABLET BY MOUTH AT BEDTIME 06/12/16   Burtis Junes, NP    Family History Family History  Problem Relation Age of Onset  . Heart disease Mother   . Heart disease Father     Social History Social History   Tobacco Use  . Smoking status: Never Smoker  . Smokeless tobacco: Never Used  Substance Use Topics  . Alcohol use: No  . Drug use: No     Allergies   Patient has no known allergies.   Review of Systems Review of Systems  Constitutional: Negative for chills and fever.  HENT: Negative for congestion,  ear pain and sore throat.   Eyes: Negative for pain and visual disturbance.  Respiratory: Positive for shortness of breath. Negative for cough.        Chronic  Cardiovascular: Negative for chest pain and palpitations.  Gastrointestinal: Negative for abdominal pain, nausea and vomiting.  Genitourinary: Negative for dysuria and hematuria.  Musculoskeletal: Positive for gait problem. Negative for arthralgias and back pain.       Falls  Skin: Positive for wound. Negative for color change and rash.  Neurological: Positive for weakness. Negative for dizziness, seizures, syncope and headaches.  Generalized weakness from deconditioning  All other systems reviewed and are negative.    Physical Exam Triage Vital Signs ED Triage Vitals [11/16/17 1536]  Enc Vitals Group     BP (!) 150/63     Pulse Rate (!) 103     Resp (!) 22     Temp 98 F (36.7 C)     Temp Source Oral     SpO2 95 %     Weight      Height      Head Circumference      Peak Flow      Pain Score      Pain Loc      Pain Edu?      Excl. in [place]?    No data found.  Updated Vital Signs BP (!) 150/63 (BP Location: Left Arm)   Pulse (!) 103   Temp 98 F (36.7 C) (Oral)   Resp (!) 22   SpO2 95%       Physical Exam  Constitutional: She appears well-developed and well-nourished. No distress.  Elderly.  In wheelchair.  Appears weak  HENT:  Head: Normocephalic and atraumatic.  Mouth/Throat: Oropharynx is clear and moist.  Eyes: Pupils are equal, round, and reactive to light. Conjunctivae are normal.  Neck: Normal range of motion.  Cardiovascular: Normal rate, regular rhythm and normal heart sounds.  Pulmonary/Chest: Effort normal. No respiratory distress. She has rales.  Some crackles in the bases  Abdominal: Soft. She exhibits no distension.  Musculoskeletal: Normal range of motion. She exhibits no edema.  Neurological: She is alert.  Head bobs, question essential tremor  Skin: Skin is warm and dry.  Right  dorsum of hand has an irregular 7-8 cm skin tear.  Skin is quite thin.  Bleeding controlled with pressure.  Psychiatric: She has a normal mood and affect. Her behavior is normal.     UC Treatments / Results  Labs (all labs ordered are listed, but only abnormal results are displayed) Labs Reviewed - No data to display  EKG None  Radiology No results found.  Procedures Laceration Repair Date/Time: 11/16/2017 9:26 PM Performed by: Raylene Everts, MD Authorized by: Raylene Everts, MD   Consent:    Consent obtained:  Verbal   Consent given by:  Patient   Risks discussed:  Infection and poor cosmetic result   Alternatives discussed:  No treatment Anesthesia (see MAR for exact dosages):    Anesthesia method:  None Laceration details:    Location:  Hand   Hand location:  R hand, dorsum   Length (cm):  7   Depth (mm):  3 (Superficial skin tear) Repair type:    Repair type:  Simple Exploration:    Hemostasis achieved with:  Direct pressure   Wound extent: areolar tissue violated     Contaminated: no   Treatment:    Area cleansed with:  Hibiclens   Amount of cleaning:  Standard Skin repair:    Repair method:  Steri-Strips Approximation:    Approximation:  Loose Post-procedure details:    Dressing:  Bulky dressing   Patient tolerance of procedure:  Tolerated well, no immediate complications   (including critical care time)  Medications Ordered in UC Medications  acetaminophen (TYLENOL) tablet 975 mg (975 mg Oral Given 11/16/17 1604)  Tdap (BOOSTRIX) injection 0.5 mL (0.5 mLs Intramuscular Given 11/16/17 1642)    Initial Impression / Assessment and Plan / UC Course  I have reviewed the triage vital signs and  the nursing notes.  Pertinent labs & imaging results that were available during my care of the patient were reviewed by me and considered in my medical decision making (see chart for details).    Discussed wound care at home.  Keep dry.  Protect tape.   Soak off in 5 to 7 days.  Watch for infection.  Final Clinical Impressions(s) / UC Diagnoses   Final diagnoses:  Laceration of skin of right hand, initial encounter     Discharge Instructions     Keep clean and dry Watch for infection Return as needed    ED Prescriptions    None     Controlled Substance Prescriptions [place] Controlled Substance Registry consulted? Not Applicable   Raylene Everts, MD 11/16/17 2128

## 2017-11-16 NOTE — ED Triage Notes (Signed)
Pt presents with a right hand laceration.

## 2017-11-16 NOTE — Discharge Instructions (Addendum)
Keep clean and dry Watch for infection Return as needed

## 2017-11-27 ENCOUNTER — Encounter (HOSPITAL_COMMUNITY)
Admission: RE | Admit: 2017-11-27 | Discharge: 2017-11-27 | Disposition: A | Discharge status: Home / Self Care | Payer: Medicare Other | Source: Ambulatory Visit | Attending: Internal Medicine | Admitting: Internal Medicine

## 2017-11-27 VITALS — BP 135/86 | HR 90 | Temp 98.1°F

## 2017-11-27 DIAGNOSIS — D631 Anemia in chronic kidney disease: Secondary | ICD-10-CM

## 2017-11-27 DIAGNOSIS — N189 Chronic kidney disease, unspecified: Secondary | ICD-10-CM | POA: Insufficient documentation

## 2017-11-27 LAB — POCT HEMOGLOBIN-HEMACUE: Hemoglobin: 10.8 g/dL — ABNORMAL LOW (ref 12.0–15.0)

## 2017-11-27 MED ORDER — EPOETIN ALFA 20000 UNIT/ML IJ SOLN
INTRAMUSCULAR | Status: AC
  Administered 2017-11-27: 20000 [IU] via SUBCUTANEOUS
  Filled 2017-11-27: qty 1

## 2017-11-27 MED ORDER — EPOETIN ALFA 10000 UNIT/ML IJ SOLN: 30000.0000 [IU] | INTRAMUSCULAR | Status: DC

## 2017-11-27 MED ORDER — EPOETIN ALFA 10000 UNIT/ML IJ SOLN
INTRAMUSCULAR | Status: AC
  Administered 2017-11-27: 10000 [IU] via SUBCUTANEOUS
  Filled 2017-11-27: qty 1

## 2017-12-03 ENCOUNTER — Encounter (HOSPITAL_COMMUNITY): Payer: Self-pay | Admitting: Emergency Medicine

## 2017-12-03 ENCOUNTER — Ambulatory Visit (HOSPITAL_COMMUNITY)
Admission: EM | Admit: 2017-12-03 | Discharge: 2017-12-03 | Disposition: A | Discharge status: Home / Self Care | Payer: Medicare Other | Attending: Family Medicine | Admitting: Family Medicine

## 2017-12-03 DIAGNOSIS — S61411D Laceration without foreign body of right hand, subsequent encounter: Secondary | ICD-10-CM | POA: Diagnosis not present

## 2017-12-03 DIAGNOSIS — Z5189 Encounter for other specified aftercare: Secondary | ICD-10-CM

## 2017-12-03 DIAGNOSIS — Z4800 Encounter for change or removal of nonsurgical wound dressing: Secondary | ICD-10-CM | POA: Diagnosis not present

## 2017-12-03 NOTE — ED Triage Notes (Signed)
Per family, pt had a skin tear on the R hand two weeks ago with poor healing. Pt concerned for infection.

## 2017-12-03 NOTE — ED Provider Notes (Signed)
[place]    CSN: [id number] Arrival date & time: 12/03/17  1646     History   Chief Complaint Chief Complaint  Patient presents with  . Appointment    5pm  . Wound Check    HPI Leslie Coleman is a 84 y.o. female.   HPI  Patient was seen 11/16/2017 for a skin tear to her right hand.  This is repaired with Steri-Strips.  She is here today for wound check.  Her son-in-law brings her back.  She is concerned because there is some drainage and she is worried about infection.  Tapes are still in place.  They have been changing the dressing every couple of days.  Is tightly wrapped with Coban.  She has very little pain.  No fever chills.  No more falls.  Past Medical History:  Diagnosis Date  . Abnormal PFTs (pulmonary function tests) 2008   No obstruction noted. Has been followed clinically.  . Anemia    treated with Procrit  . Fall from slip, trip, or stumble 05/07/2016   [place] at [employer] resulting in right tibial plateau fracture  . Gout   . High risk medication use    on amiodarone.  Marland Kitchen HTN (hypertension)    Last echo in 2004; EF normal  . Hyperlipidemia   . Hypothyroidism    on replacement  . Irregular heart beat "since 1985"  . Obesity   . Type II diabetes mellitus ([place])   . V-tach [facility])    remote VT, treated initially with procainamide, on amiodarone since 2008    Patient Active Problem List   Diagnosis Date Noted  . Acute kidney injury ([place]) 10/19/2016  . Dizziness 10/19/2016  . CKD (chronic kidney disease) stage 4, GFR 15-29 ml/min (HCC) 10/19/2016  . Hypothyroidism, adult 10/19/2016  . Abnormal urinalysis 10/19/2016  . Diabetes mellitus type 2, uncontrolled ([place]) 10/19/2016  . Anemia due to chronic kidney disease 10/19/2016  . Thrombocytopenia ([place]) 10/19/2016  . Candidal dermatitis 10/19/2016  . Stress incontinence 10/19/2016  . V-tach ([place]) 10/19/2016  . Pressure injury of skin 10/19/2016  . Diabetes mellitus with complication ([place])   .  Urinary tract infection without hematuria   . CKD (chronic kidney disease), stage III ([place]) 05/20/2016  . Hypothyroidism 05/20/2016  . Closed fracture of right tibial plateau 05/12/2016  . Anemia in chronic kidney disease 12/16/2015  . Dizzy 07/07/2011  . Dehydration 07/07/2011  . Type II diabetes mellitus ([place]) 07/06/2011  . History of long-term treatment with high-risk medication 08/29/2010  . Abnormal PFTs (pulmonary function tests)   . History of ventricular tachycardia   . HTN (hypertension)   . Hyperlipidemia   . Gout     Past Surgical History:  Procedure Laterality Date  . CARDIOVASCULAR STRESS TEST  07/07/2003   EF 70%. NO EVIDENCE OF ISCHEMIA  . CARDIOVERSION  1978  . CATARACT EXTRACTION, BILATERAL Bilateral   . CATARACT EXTRACTION, BILATERAL Bilateral   . CESAREAN SECTION  1957; 1961; 1967  . FRACTURE SURGERY    . ORIF TIBIA PLATEAU Right 05/10/2016   Procedure: OPEN REDUCTION INTERNAL FIXATION (ORIF) RIGHT TIBIAL PLATEAU  WITH BONE GRAFT;  Surgeon: Leandrew Koyanagi, MD;  Location: [facility];  Service: Orthopedics;  Laterality: Right;  . US ECHOCARDIOGRAPHY  12/29/2002   EF 60-65%  . VAGINAL HYSTERECTOMY      OB History   None      Home Medications    Prior to Admission medications   Medication  Sig Start Date End Date Taking? Authorizing Provider  amiodarone (PACERONE) 100 MG tablet Take 1 tablet (100 mg total) by mouth every morning. 09/18/17   Burtis Junes, NP  amLODipine (NORVASC) 5 MG tablet Take 1 tablet (5 mg total) by mouth daily. 07/20/17 10/18/17  Burtis Junes, NP  aspirin 81 MG EC tablet Take 1 tablet (81 mg total) by mouth daily. 07/20/17   Burtis Junes, NP  Epoetin Alfa (PROCRIT IJ) Inject 10,000 Units as directed every 28 (twenty-eight) days.     [provider]  furosemide (LASIX) 20 MG tablet Take 1 tablet (20 mg total) by mouth daily. 04/24/17 08/27/17  Burtis Junes, NP  glipiZIDE (GLUCOTROL XL) 5 MG 24 hr tablet Take 5 mg by mouth daily.  04/03/16   [provider]  hydrALAZINE (APRESOLINE) 50 MG tablet TAKE 1 TABLET BY MOUTH TWICE A DAY 04/24/16   Allred, Jeneen Rinks, MD  levothyroxine (SYNTHROID, LEVOTHROID) 100 MCG tablet Take 1 tablet (100 mcg total) by mouth daily before breakfast. 10/18/17   Burtis Junes, NP  Melatonin 5 MG CAPS Take 5 mg by mouth daily.    [provider]  Multiple Vitamin (MULTIVITAMIN WITH MINERALS) TABS tablet Take 1 tablet by mouth daily.    [provider]  nystatin (MYCOSTATIN/NYSTOP) powder Apply topically 3 (three) times daily. 10/20/16   Rama, Venetia Maxon, MD  omega-3 acid ethyl esters (LOVAZA) 1 g capsule Take 2 capsules by mouth twice daily. 06/20/17   Burtis Junes, NP  potassium chloride (K-DUR) 10 MEQ tablet Take 10 mEq by mouth every morning.     [provider]  pravastatin (PRAVACHOL) 20 MG tablet TAKE 1 TABLET BY MOUTH AT BEDTIME 06/12/16   Burtis Junes, NP    Family History Family History  Problem Relation Age of Onset  . Heart disease Mother   . Heart disease Father     Social History Social History   Tobacco Use  . Smoking status: Never Smoker  . Smokeless tobacco: Never Used  Substance Use Topics  . Alcohol use: No  . Drug use: No     Allergies   Patient has no known allergies.   Review of Systems Review of Systems  Constitutional: Negative for chills and fever.  HENT: Negative for ear pain and sore throat.   Eyes: Negative for pain and visual disturbance.  Respiratory: Negative for cough and shortness of breath.   Cardiovascular: Negative for chest pain and palpitations.  Gastrointestinal: Negative for abdominal pain and vomiting.  Genitourinary: Negative for dysuria and hematuria.  Musculoskeletal: Negative for arthralgias and back pain.  Skin: Positive for wound. Negative for color change and rash.  Neurological: Negative for seizures and syncope.  All other systems reviewed and are negative.    Physical Exam Triage  Vital Signs ED Triage Vitals [12/03/17 1656]  Enc Vitals Group     BP 122/69     Pulse Rate 87     Resp 16     Temp (!) 97.4 F (36.3 C)     Temp Source Oral     SpO2 95 %     Weight      Height      Head Circumference      Peak Flow      Pain Score      Pain Loc      Pain Edu?      Excl. in [place]?    No data found.  Updated  Vital Signs BP 122/69 (BP Location: Left Arm)   Pulse 87   Temp (!) 97.4 F (36.3 C) (Oral)   Resp 16   SpO2 95%   Visual Acuity Right Eye Distance:   Left Eye Distance:   Bilateral Distance:    Right Eye Near:   Left Eye Near:    Bilateral Near:     Physical Exam  Constitutional: She appears well-developed and well-nourished. No distress.  In wheelchair  HENT:  Head: Normocephalic and atraumatic.  Mouth/Throat: Oropharynx is clear and moist.  Eyes: Pupils are equal, round, and reactive to light. Conjunctivae are normal.  Neck: Normal range of motion.  Cardiovascular: Normal rate.  Pulmonary/Chest: Effort normal. No respiratory distress.  Abdominal: Soft. She exhibits no distension.  Musculoskeletal: Normal range of motion. She exhibits no edema.  Neurological: She is alert.  Tremor-head  Skin: Skin is warm and dry.  Dorsum of right hand is examined.  Bandages removed.  Partially adhered to laceration.  It is soaked in a very diluted warm Betadine solution.  Dressing is then removed.  Steri-Strips gently peeled away.  The distal portion of the wound 2-1/2 x 1 cm is still open where the edges did not quite meet.  Initially has some yellow exudate and odor.  After cleansing it looks good.  There is no cellulitis.  New dressing is placed and wound care is reviewed.  Psychiatric: She has a normal mood and affect. Her behavior is normal.     UC Treatments / Results  Labs (all labs ordered are listed, but only abnormal results are displayed) Labs Reviewed - No data to display  EKG None  Radiology No results  found.  Procedures Procedures (including critical care time)  Medications Ordered in UC Medications - No data to display  Initial Impression / Assessment and Plan / UC Course  I have reviewed the triage vital signs and the nursing notes.  Pertinent labs & imaging results that were available during my care of the patient were reviewed by me and considered in my medical decision making (see chart for details).      Final Clinical Impressions(s) / UC Diagnoses   Final diagnoses:  Visit for wound check     Discharge Instructions     Wash daily.  Apply antibiotic ointment and a nonstick dressing.  You need to do this for a few more days.  Once it heals over the Band-Aid will be fine.  No infection is present.   ED Prescriptions    None     Controlled Substance Prescriptions [place] Controlled Substance Registry consulted? No   Raylene Everts, MD 12/03/17 2113

## 2017-12-03 NOTE — ED Notes (Signed)
Bed: UC01 Expected date:  Expected time:  Means of arrival:  Comments: 

## 2017-12-03 NOTE — Discharge Instructions (Signed)
Wash daily.  Apply antibiotic ointment and a nonstick dressing.  You need to do this for a few more days.  Once it heals over the Band-Aid will be fine.  No infection is present.

## 2017-12-24 ENCOUNTER — Ambulatory Visit (HOSPITAL_COMMUNITY)
Admission: RE | Admit: 2017-12-24 | Discharge: 2017-12-24 | Disposition: A | Discharge status: Home / Self Care | Payer: Medicare Other | Source: Ambulatory Visit | Attending: Internal Medicine | Admitting: Internal Medicine

## 2017-12-24 VITALS — BP 152/79 | HR 90 | Temp 98.2°F | Ht 60.0 in

## 2017-12-24 DIAGNOSIS — N184 Chronic kidney disease, stage 4 (severe): Secondary | ICD-10-CM | POA: Diagnosis not present

## 2017-12-24 DIAGNOSIS — D631 Anemia in chronic kidney disease: Secondary | ICD-10-CM

## 2017-12-24 DIAGNOSIS — I1310 Hypertensive heart and chronic kidney disease without heart failure, with stage 1 through stage 4 chronic kidney disease, or unspecified chronic kidney disease: Secondary | ICD-10-CM | POA: Insufficient documentation

## 2017-12-24 DIAGNOSIS — N189 Chronic kidney disease, unspecified: Secondary | ICD-10-CM

## 2017-12-24 LAB — POCT HEMOGLOBIN-HEMACUE: Hemoglobin: 10.8 g/dL — ABNORMAL LOW (ref 12.0–15.0)

## 2017-12-24 MED ORDER — EPOETIN ALFA 20000 UNIT/ML IJ SOLN
INTRAMUSCULAR | Status: AC
  Administered 2017-12-24: 20000 [IU]
  Filled 2017-12-24: qty 1

## 2017-12-24 MED ORDER — EPOETIN ALFA 10000 UNIT/ML IJ SOLN
INTRAMUSCULAR | Status: AC
  Administered 2017-12-24: 10000 [IU] via SUBCUTANEOUS
  Filled 2017-12-24: qty 1

## 2017-12-24 MED ORDER — EPOETIN ALFA 10000 UNIT/ML IJ SOLN
30000.0000 [IU] | INTRAMUSCULAR | Status: DC
  Administered 2017-12-24: 10000 [IU] via SUBCUTANEOUS

## 2017-12-25 ENCOUNTER — Ambulatory Visit (INDEPENDENT_AMBULATORY_CARE_PROVIDER_SITE_OTHER): Payer: Medicare Other | Admitting: Nurse Practitioner

## 2017-12-25 ENCOUNTER — Encounter (HOSPITAL_COMMUNITY): Payer: Medicare Other

## 2017-12-25 ENCOUNTER — Encounter: Payer: Self-pay | Admitting: Nurse Practitioner

## 2017-12-25 VITALS — BP 100/60 | HR 84 | Ht 62.0 in | Wt 162.1 lb

## 2017-12-25 DIAGNOSIS — I519 Heart disease, unspecified: Secondary | ICD-10-CM | POA: Diagnosis not present

## 2017-12-25 DIAGNOSIS — E038 Other specified hypothyroidism: Secondary | ICD-10-CM

## 2017-12-25 DIAGNOSIS — Z79899 Other long term (current) drug therapy: Secondary | ICD-10-CM

## 2017-12-25 DIAGNOSIS — E7800 Pure hypercholesterolemia, unspecified: Secondary | ICD-10-CM | POA: Diagnosis not present

## 2017-12-25 DIAGNOSIS — I10 Essential (primary) hypertension: Secondary | ICD-10-CM | POA: Diagnosis not present

## 2017-12-25 DIAGNOSIS — I472 Ventricular tachycardia, unspecified: Secondary | ICD-10-CM

## 2017-12-25 LAB — CBC
Hematocrit: 33 % — ABNORMAL LOW (ref 34.0–46.6)
Hemoglobin: 11 g/dL — ABNORMAL LOW (ref 11.1–15.9)
MCH: 29.8 pg (ref 26.6–33.0)
MCHC: 33.3 g/dL (ref 31.5–35.7)
MCV: 89 fL (ref 79–97)
Platelets: 178 10*3/uL (ref 150–450)
RBC: 3.69 x10E6/uL — ABNORMAL LOW (ref 3.77–5.28)
RDW: 15.2 % (ref 12.3–15.4)
WBC: 6.3 10*3/uL (ref 3.4–10.8)

## 2017-12-25 LAB — HEPATIC FUNCTION PANEL
ALT: 13 IU/L (ref 0–32)
AST: 22 IU/L (ref 0–40)
Albumin: 3.3 g/dL — ABNORMAL LOW (ref 3.5–4.7)
Alkaline Phosphatase: 92 IU/L (ref 39–117)
Bilirubin Total: <0.2 mg/dL (ref 0.0–1.2)
Bilirubin, Direct: 0.09 mg/dL (ref 0.00–0.40)
Total Protein: 5.9 g/dL — ABNORMAL LOW (ref 6.0–8.5)

## 2017-12-25 LAB — BASIC METABOLIC PANEL
BUN/Creatinine Ratio: 9 — ABNORMAL LOW (ref 12–28)
BUN: 19 mg/dL (ref 8–27)
CO2: 19 mmol/L — ABNORMAL LOW (ref 20–29)
Calcium: 8.9 mg/dL (ref 8.7–10.3)
Chloride: 100 mmol/L (ref 96–106)
Creatinine, Ser: 2.1 mg/dL — ABNORMAL HIGH (ref 0.57–1.00)
GFR calc Af Amer: 24 mL/min/{1.73_m2} — ABNORMAL LOW (ref >59)
GFR calc non Af Amer: 21 mL/min/{1.73_m2} — ABNORMAL LOW (ref >59)
Glucose: 240 mg/dL — ABNORMAL HIGH (ref 65–99)
Potassium: 4 mmol/L (ref 3.5–5.2)
Sodium: 137 mmol/L (ref 134–144)

## 2017-12-25 LAB — LIPID PANEL
Chol/HDL Ratio: 5.5 ratio — ABNORMAL HIGH (ref 0.0–4.4)
Cholesterol, Total: 104 mg/dL (ref 100–199)
HDL: 19 mg/dL — ABNORMAL LOW (ref >39)
LDL Calculated: 28 mg/dL (ref 0–99)
Triglycerides: 285 mg/dL — ABNORMAL HIGH (ref 0–149)
VLDL Cholesterol Cal: 57 mg/dL — ABNORMAL HIGH (ref 5–40)

## 2017-12-25 LAB — TSH: TSH: 0.675 u[IU]/mL (ref 0.450–4.500)

## 2017-12-25 NOTE — Progress Notes (Signed)
CARDIOLOGY OFFICE NOTE  Date:  12/25/2017    Leslie Coleman Date of Birth: 1929/04/21 Medical Record #[id number]  PCP:  Burtis Junes, NP  Cardiologist:  Servando Snare Allred    Chief Complaint  Patient presents with  . Follow-up    4 month check - seen for Dr. Rayann Heman    History of Present Illness: Leslie Coleman is a 84 y.o. female who presents today for a 4 month check. Seen for Dr. Rayann Heman - former patient of Dr. Susa Simmonds. She primarily follows with me.   She has a remote history of VT - treated initially with procainamide but switched to amiodarone back in 2008 due to unavailability of the medicine. Does have chronically abnormal PFTs. Other issues include HTN, gout, obesity, DM and anemia. On chronic Procrit.   I saw her back in September &October of 2015 - seemed to be getting feeble. Had a cold. Chronic DOE noted. More swelling. Weight was up. I added Lasix for a few days and she ended up taking just prn. Cut her atenolol back due to bradycardia and updated her labs and echo. TSH was elevated and we increased her thyroid medicine. She saw Dr. Rayann Heman in June of 2016 and was felt to be stable.   She was in the ER back in May of 2017 - was dizzy. Looks like she was treated with IVF and antibiotics. Her HR was 42 by EKG - this was notaddressed upon my review. I then saw her in [place] her regular check - she remained bradycardic - she was asymptomatic - I stopped her Atenolol.  She had lost a significant amount of weight - supposedly intentional.   Several phone calls in Februaryof 2018 to discuss medicines. Had been at some rehab facility after falling and breaking her leg - medicines very difficult to clarify - she was asked to come in and bring all her bottles and instructions with her so we could straighten out. I then saw her - she had multiple lists, very confused overall about what she was taking. Daughter did not seem to know what she was taking as well. I attempted  to straighten this out.   Hospitalizedin Mayof 2018 with dizziness - thought to be dehydrated with possible UTI.She was rehydrated. Lasix and ARB held. She was not orthostatic.I then saw herfor her post hospital check- she was doing ok - had stopped driving. Not dizzy butnotdrinking enough water- loves soda instead and apparently drinks a"massive quantity" according to the family. She uses "[facility]" and her lasix and ARB had not been taken out - thus she was still taking. Family was to have these medicines withdrawn. I have since added back low dose Lasix. Last seen in April and seemed to be holding her own. Pretty sedentary. She had had a fall.   Comes in today. Here with family today.She is doing ok. Drinking less soda and more water. Has had a fall - tripped while out on the porch. She skinned up her hand - it has healed. Pretty sedentary. No chest pain. Breathing is ok. Not dizzy or lightheaded. Swelling is stable. Needs labs today.   Past Medical History:  Diagnosis Date  . Abnormal PFTs (pulmonary function tests) 2008   No obstruction noted. Has been followed clinically.  . Anemia    treated with Procrit  . Fall from slip, trip, or stumble 05/07/2016   [place] resulting in [place] plateau fracture  . Gout   .  High risk medication use    on amiodarone.  Marland Kitchen HTN (hypertension)    Last echo in 2004; EF normal  . Hyperlipidemia   . Hypothyroidism    on replacement  . Irregular heart beat "since 1985"  . Obesity   . Type II diabetes mellitus ([place])   . V-tach [facility])    remote VT, treated initially with procainamide, on amiodarone since 2008    Past Surgical History:  Procedure Laterality Date  . CARDIOVASCULAR STRESS TEST  07/07/2003   EF 70%. NO EVIDENCE OF ISCHEMIA  . CARDIOVERSION  1978  . CATARACT EXTRACTION, BILATERAL Bilateral   . CATARACT EXTRACTION, BILATERAL Bilateral   . CESAREAN SECTION  1957; 1961; 1967  . FRACTURE SURGERY    . ORIF TIBIA  PLATEAU Right 05/10/2016   Procedure: OPEN REDUCTION INTERNAL FIXATION (ORIF) RIGHT TIBIAL PLATEAU  WITH BONE GRAFT;  Surgeon: Leandrew Koyanagi, MD;  Location: [facility];  Service: Orthopedics;  Laterality: Right;  . US ECHOCARDIOGRAPHY  12/29/2002   EF 60-65%  . VAGINAL HYSTERECTOMY       Medications: Current Meds  Medication Sig  . amiodarone (PACERONE) 100 MG tablet Take 1 tablet (100 mg total) by mouth every morning.  Marland Kitchen aspirin 81 MG EC tablet Take 1 tablet (81 mg total) by mouth daily.  Marland Kitchen Epoetin Alfa (PROCRIT IJ) Inject 10,000 Units as directed every 28 (twenty-eight) days.   . furosemide (LASIX) 20 MG tablet Take 1 tablet (20 mg total) by mouth daily.  Marland Kitchen glipiZIDE (GLUCOTROL XL) 5 MG 24 hr tablet Take 5 mg by mouth daily.  . hydrALAZINE (APRESOLINE) 50 MG tablet TAKE 1 TABLET BY MOUTH TWICE A DAY  . levothyroxine (SYNTHROID, LEVOTHROID) 100 MCG tablet Take 1 tablet (100 mcg total) by mouth daily before breakfast.  . Melatonin 5 MG CAPS Take 5 mg by mouth daily.  . Multiple Vitamin (MULTIVITAMIN WITH MINERALS) TABS tablet Take 1 tablet by mouth daily.  Marland Kitchen nystatin (MYCOSTATIN/NYSTOP) powder Apply topically 3 (three) times daily.  Marland Kitchen omega-3 acid ethyl esters (LOVAZA) 1 g capsule Take 2 capsules by mouth twice daily.  . potassium chloride (K-DUR) 10 MEQ tablet Take 10 mEq by mouth every morning.   . pravastatin (PRAVACHOL) 20 MG tablet TAKE 1 TABLET BY MOUTH AT BEDTIME  . [DISCONTINUED] amLODipine (NORVASC) 5 MG tablet Take 1 tablet (5 mg total) by mouth daily.     Allergies: No Known Allergies  Social History: The patient  reports that she has never smoked. She has never used smokeless tobacco. She reports that she does not drink alcohol or use drugs.   Family History: The patient's family history includes Heart disease in her father and mother.   Review of Systems: Please see the history of present illness.   Otherwise, the review of systems is positive for none.   All other systems  are reviewed and negative.   Physical Exam: VS:  BP 100/60 (BP Location: Left Arm, Patient Position: Sitting, Cuff Size: Large)   Pulse 84   Ht 5\' 2"  (1.575 m)   Wt 162 lb 1.9 oz (73.5 kg)   BMI 29.65 kg/m  .  BMI Body mass index is 29.65 kg/m.  Wt Readings from Last 3 Encounters:  12/25/17 162 lb 1.9 oz (73.5 kg)  08/27/17 169 lb 12.8 oz (77 kg)  04/24/17 164 lb (74.4 kg)    General: Elderly. Chronically ill appearing. Alert and in no acute distress.   HEENT: Normal.  Neck: Supple, no  JVD, carotid bruits, or masses noted.  Cardiac: Regular rate and rhythm. No murmurs, rubs, or gallops. Trace edema.  Respiratory:  Lungs are clear to auscultation bilaterally with normal work of breathing.  GI: Soft and nontender.  MS: No deformity or atrophy. Gait not tested. She is in a wheelchair. Skin: Warm and dry. Color is always pale/sallow.  Neuro:  Strength and sensation are intact and no gross focal deficits noted.  Psych: Alert, appropriate and with normal affect.   LABORATORY DATA:  EKG:  EKG is ordered today. This demonstrates NSR.  Lab Results  Component Value Date   WBC 6.2 08/27/2017   HGB 10.8 (L) 12/24/2017   HCT 32.3 (L) 08/27/2017   PLT 142 (L) 08/27/2017   GLUCOSE 248 (H) 08/27/2017   CHOL 111 04/24/2017   TRIG 230 (H) 04/24/2017   HDL 22 (L) 04/24/2017   LDLCALC 43 04/24/2017   ALT 12 08/27/2017   AST 20 08/27/2017   NA 139 08/27/2017   K 4.2 08/27/2017   CL 101 08/27/2017   CREATININE 2.14 (H) 08/27/2017   BUN 22 08/27/2017   CO2 20 08/27/2017   TSH 1.100 08/27/2017   INR 1.11 06/18/2014   HGBA1C 6.0 (H) 10/19/2016     BNP (last 3 results) No results for input(s): BNP in the last 8760 hours.  ProBNP (last 3 results) No results for input(s): PROBNP in the last 8760 hours.   Other Studies Reviewed Today:   Assessment/Plan:  Echo Study Conclusions from September 2015  - Left ventricle: The cavity size was normal. Wall thickness was normal.  Systolic function was normal. The estimated ejection fraction was in the range of 60% to 65%. Left ventricular diastolic function parameters were normal. - Mitral valve: There was mild regurgitation. - Left atrium: The atrium was mildly dilated. - Right atrium: The atrium was mildly dilated.  Assessment / Plan:  1.HTN - BP fine on current regimen. Medicines thru [place].   2. History of VT - maintained on chronic low dose amiodarone therapy -no longer on Atenolol due to bradycardia - EKG remains ok today. She needs follow up lab today as well. No changes made.   3. Asymptomatic bradycardia - resolved with cessation of beta blocker. Would not restart.   4. HLD - on statin therapy with Lovaza - lab today - could think about stopping.   5. Mild LV dysfunction - most recent echo showing normal EF. She is not short of breath - she has dependent edema from being sedentary. I would favor continuing with conservative management. No changes made today.    6. Hypothyoidism - rechecking TSH today  7. History of abnormal PFTs - just followed clinically - fortunately, her shortness of breath seems stable. Not really noted or endorsed today.  Current medicines are reviewed with the patient today.  The patient does not have concerns regarding medicines other than what has been noted above.  The following changes have been made:  See above.  Labs/ tests ordered today include:    Orders Placed This Encounter  Procedures  . Basic metabolic panel  . CBC  . Hepatic function panel  . Lipid panel  . TSH  . EKG 12-Lead     Disposition:   FU with me in about 4 months. Overall, she seems to be holding her own.    Patient is agreeable to this plan and will call if any problems develop in the interim.   SignedTruitt Merle, NP  12/25/2017 10:11  AM  [facility] [address] [place], [place]  [address] Phone: [phone] Fax: [phone]

## 2017-12-25 NOTE — Patient Instructions (Addendum)
We will be checking the following labs today - BMET, CBC, HPF, Lipids and TSH   Medication Instructions:    Continue with your current medicines.     Testing/Procedures To Be Arranged:  N/A  Follow-Up:   See me in 4 months    Other Special Instructions:   N/A    If you need a refill on your cardiac medications before your next appointment, please call your pharmacy.   Call the [place] office at [phone] if you have any questions, problems or concerns.

## 2018-01-18 ENCOUNTER — Other Ambulatory Visit (HOSPITAL_COMMUNITY): Payer: Self-pay

## 2018-01-22 ENCOUNTER — Encounter (HOSPITAL_COMMUNITY)
Admission: RE | Admit: 2018-01-22 | Discharge: 2018-01-22 | Disposition: A | Discharge status: Home / Self Care | Payer: Medicare Other | Source: Ambulatory Visit | Attending: Internal Medicine | Admitting: Internal Medicine

## 2018-01-22 VITALS — BP 132/68 | HR 90 | Resp 18

## 2018-01-22 DIAGNOSIS — N189 Chronic kidney disease, unspecified: Secondary | ICD-10-CM | POA: Insufficient documentation

## 2018-01-22 DIAGNOSIS — D631 Anemia in chronic kidney disease: Secondary | ICD-10-CM | POA: Diagnosis not present

## 2018-01-22 LAB — POCT HEMOGLOBIN-HEMACUE: HEMOGLOBIN: 11.1 g/dL — AB (ref 12.0–15.0)

## 2018-01-22 MED ORDER — EPOETIN ALFA 10000 UNIT/ML IJ SOLN
INTRAMUSCULAR | Status: AC
  Filled 2018-01-22: qty 1

## 2018-01-22 MED ORDER — EPOETIN ALFA 10000 UNIT/ML IJ SOLN: 30000.0000 [IU] | INTRAMUSCULAR | Status: DC

## 2018-01-22 MED ORDER — EPOETIN ALFA 20000 UNIT/ML IJ SOLN
INTRAMUSCULAR | Status: AC
  Filled 2018-01-22: qty 1

## 2018-02-13 IMAGING — CR DG CHEST 2V
2 series · 2 of 2 positions shown · non-contrast
Comparison: 07/06/2011

CLINICAL DATA: Dizziness, decreased appetite starting [REDACTED]

EXAM:
CHEST  2 VIEW

[w chest lat]
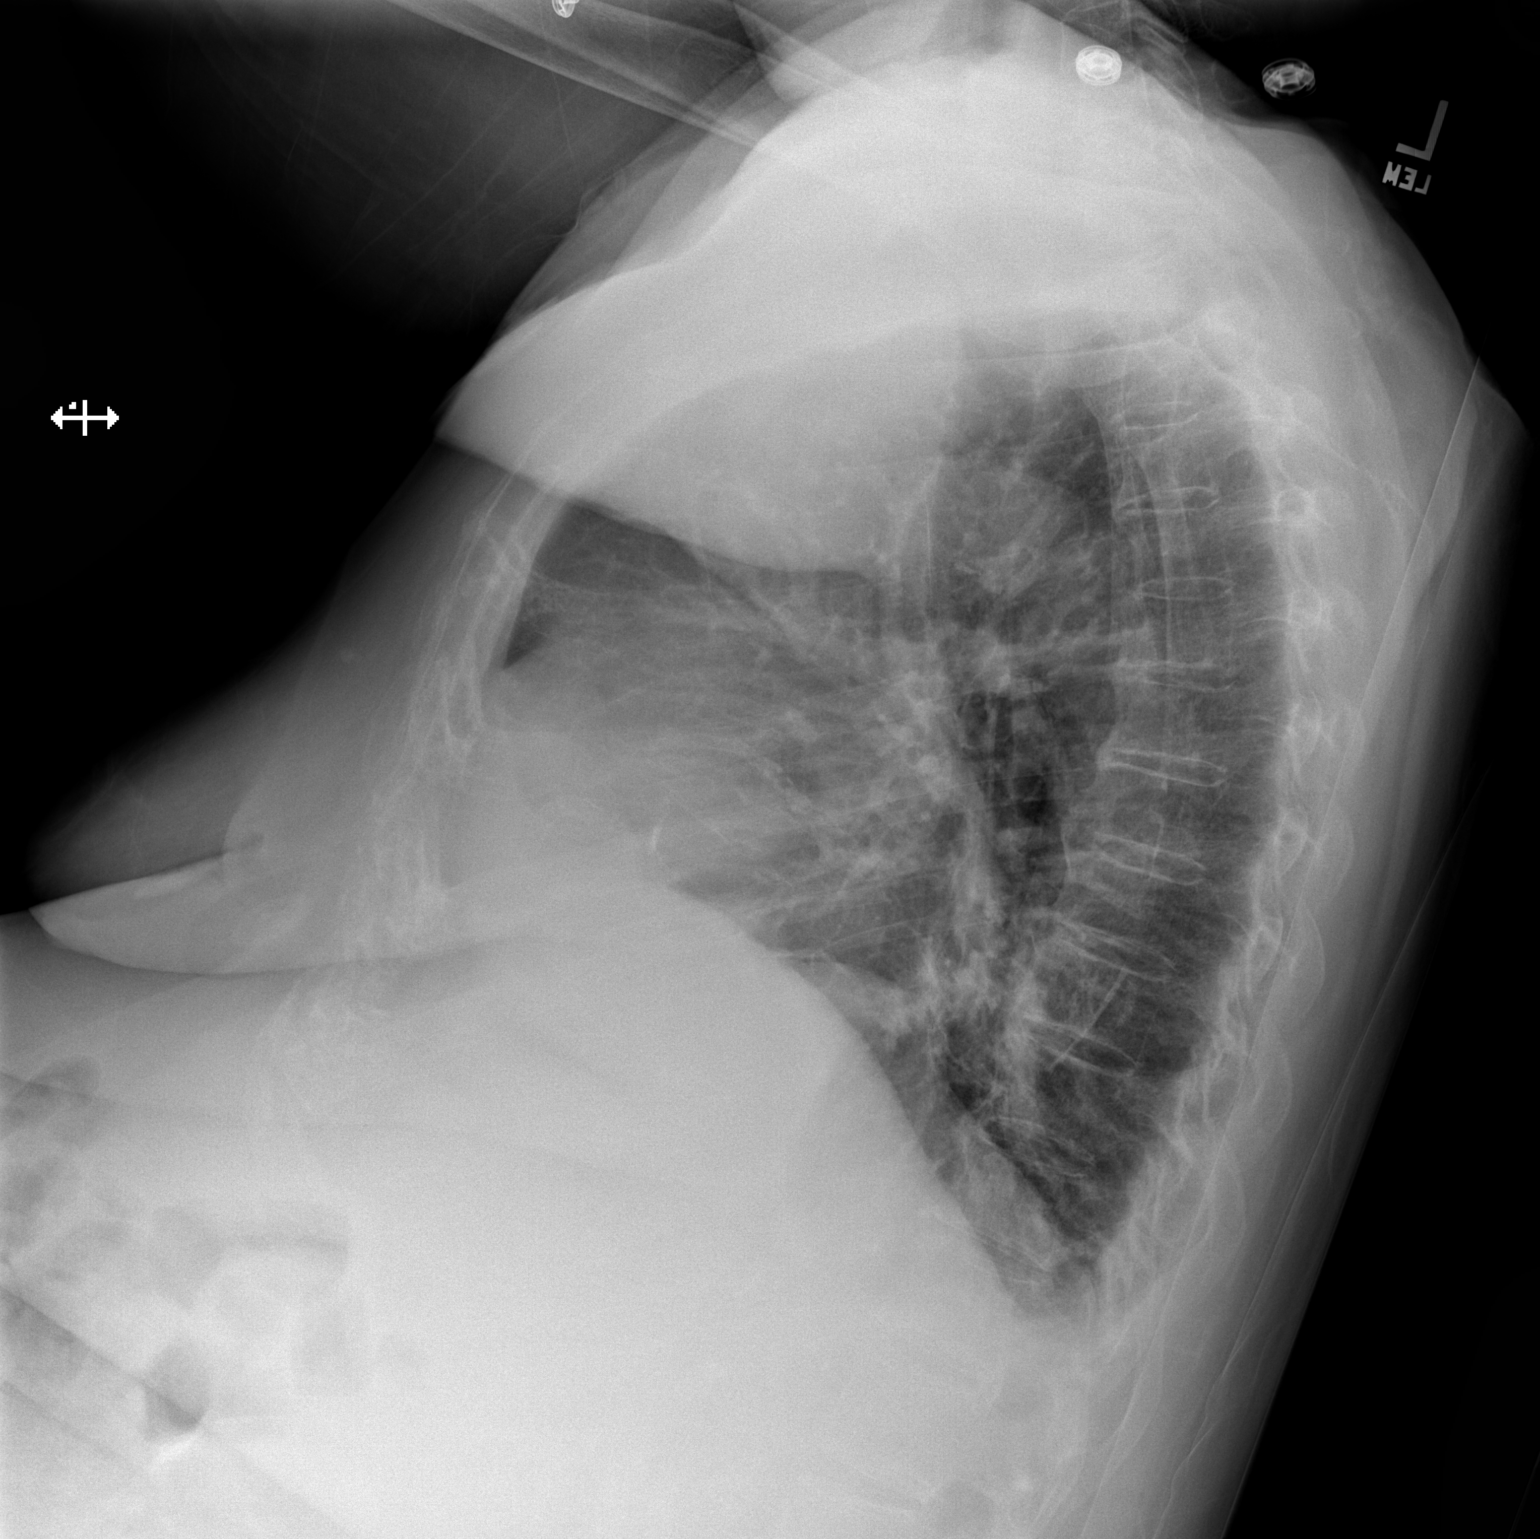

[x chest ap]
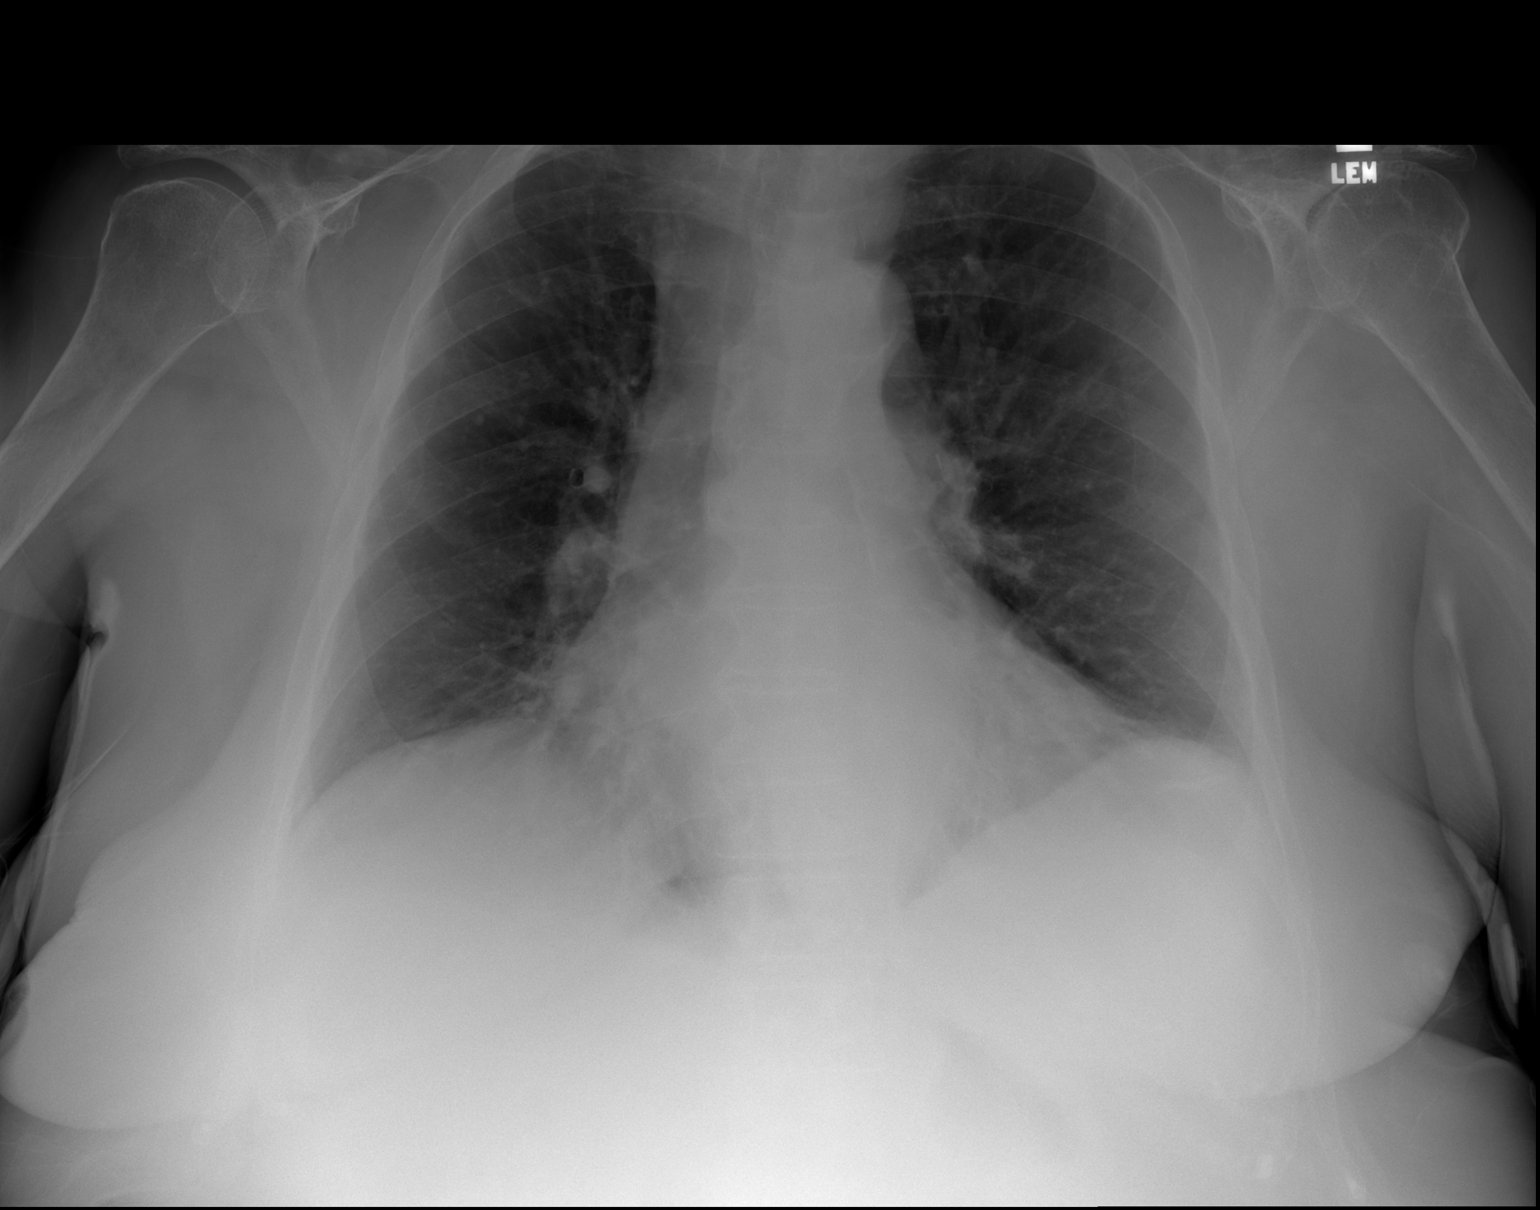

[2 of 2 positions shown; findings below may reference images not displayed]

FINDINGS: Cardiomediastinal silhouette is stable. No acute infiltrate or
pleural effusion. No pulmonary edema. Mild degenerative changes mid
and lower thoracic spine.
IMPRESSION: No active cardiopulmonary disease.

## 2018-02-13 IMAGING — CT CT HEAD W/O CM
2 series · 16 of 30 positions shown, 19 images · non-contrast
Comparison: 07/06/2011

CLINICAL DATA: Decreased appetite starting [REDACTED], dizziness

EXAM:
CT HEAD WITHOUT CONTRAST
TECHNIQUE: Contiguous axial images were obtained from the base of the skull
through the vertex without intravenous contrast.

[Series 2: head w/o · axial · non-contrast · 0.42mm/px · z∈[+1543,+1663]mm · 9 of 32 slices shown, 12 images]
[im 4/32  brain]
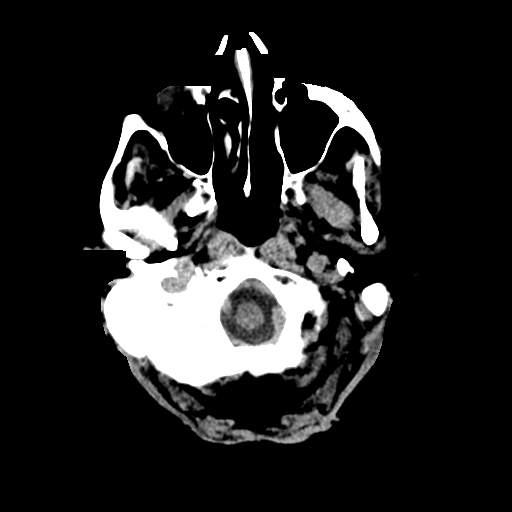
[im 4/32  bone]
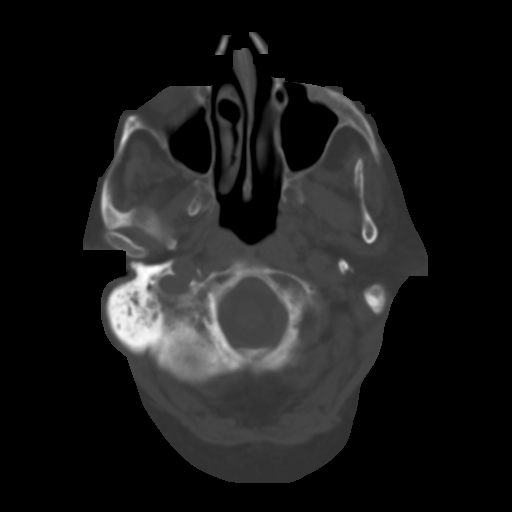
[im 7/32  brain]
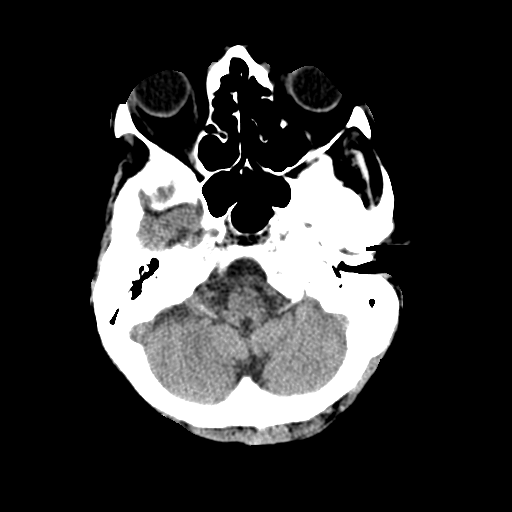
[im 10/32  brain]
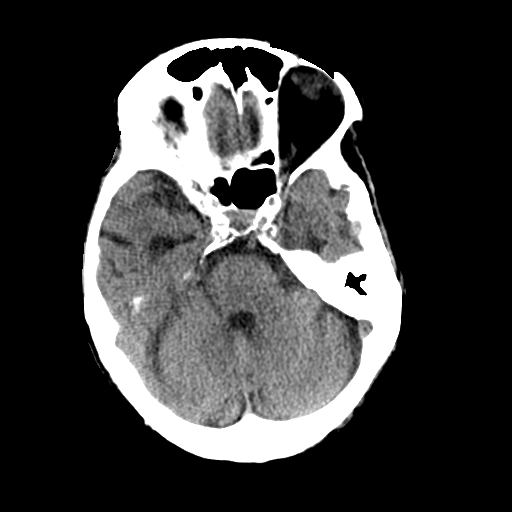
[im 13/32  brain]
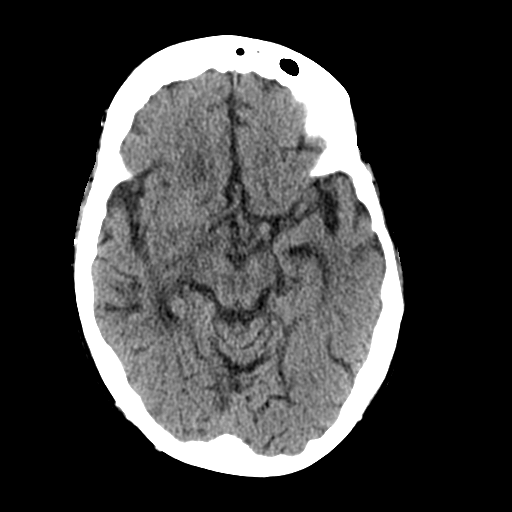
[im 16/32  brain]
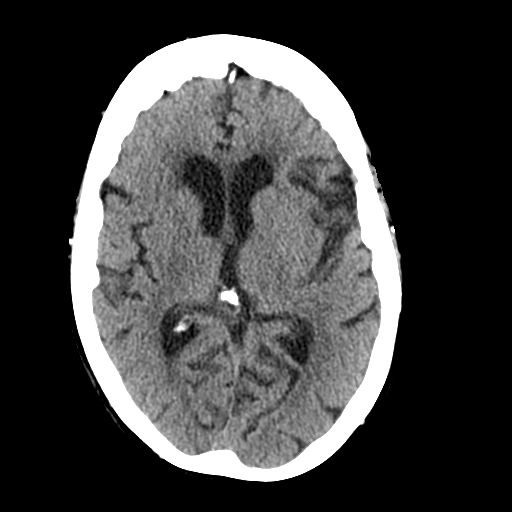
[im 16/32  bone]
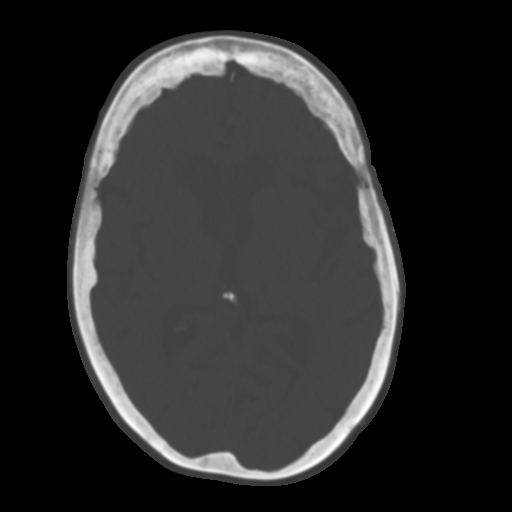
[im 19/32  brain]
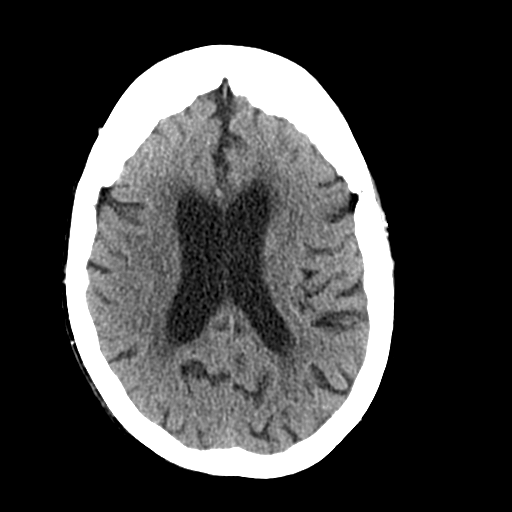
[im 22/32  brain]
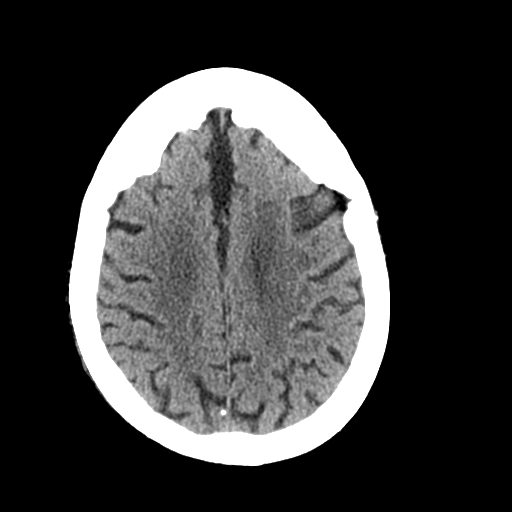
[im 25/32  brain]
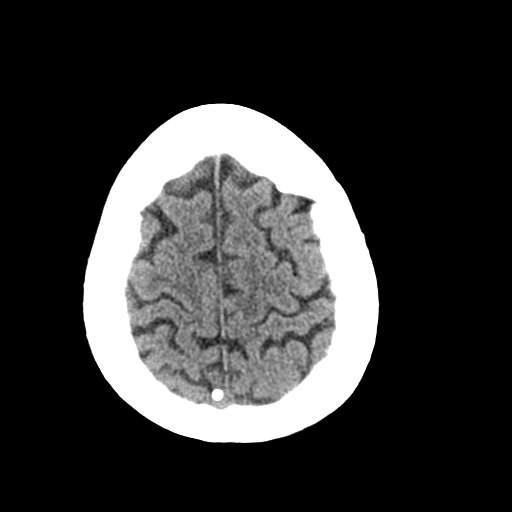
[im 28/32  brain]
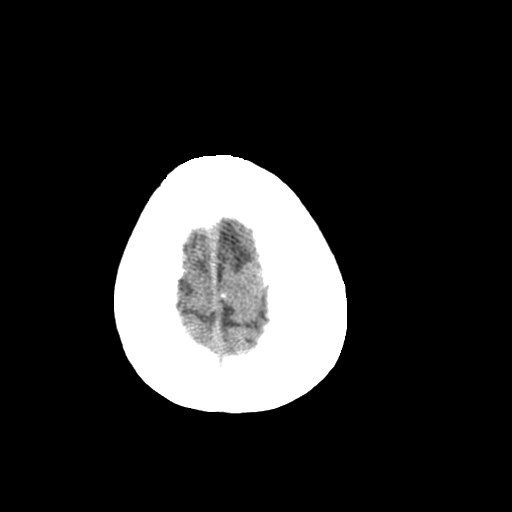
[im 28/32  bone]
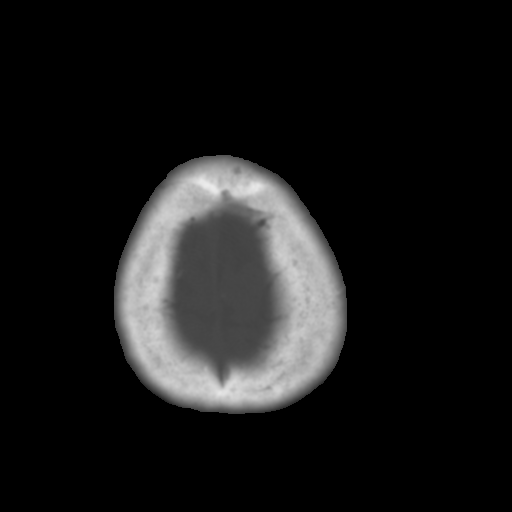

[Series 3: bone windows · axial · 0.42mm/px · z∈[+1543,+1648]mm · 7 of 53 slices shown]
[im 6/53  bone]
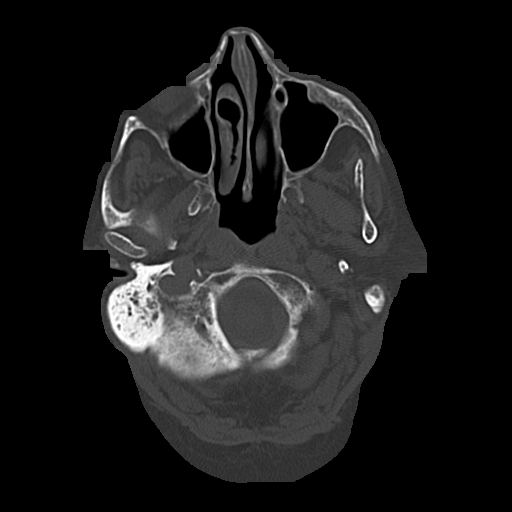
[im 12/53  bone]
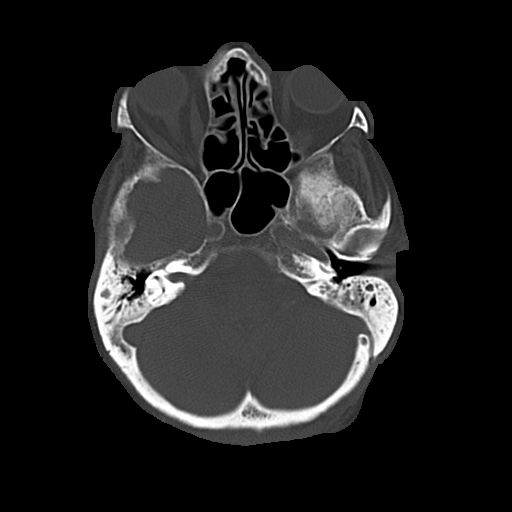
[im 18/53  bone]
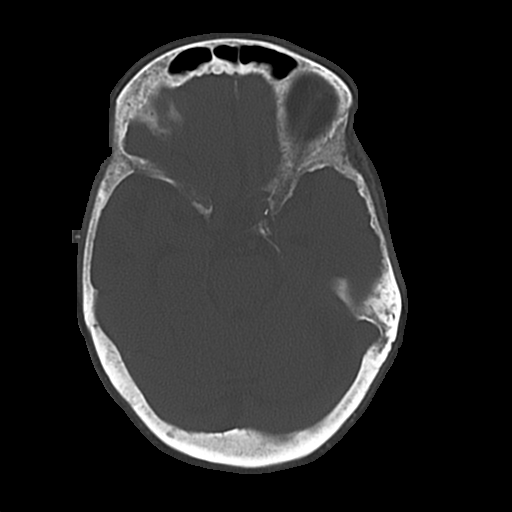
[im 24/53  bone]
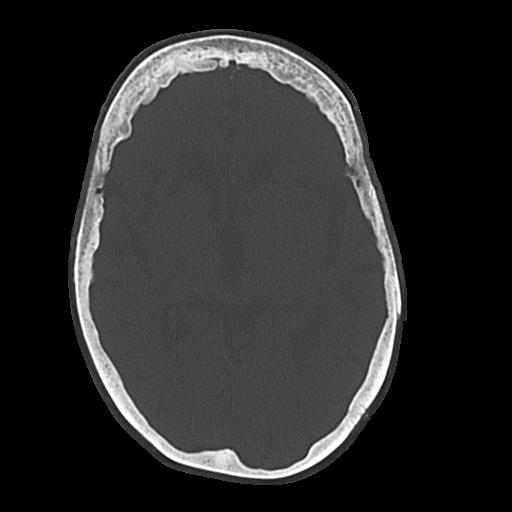
[im 29/53  bone]
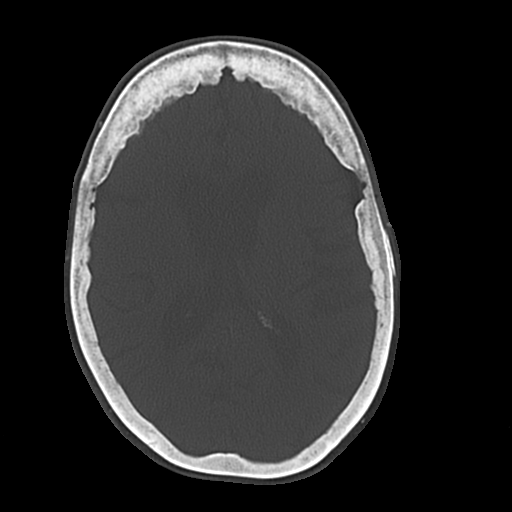
[im 35/53  bone]
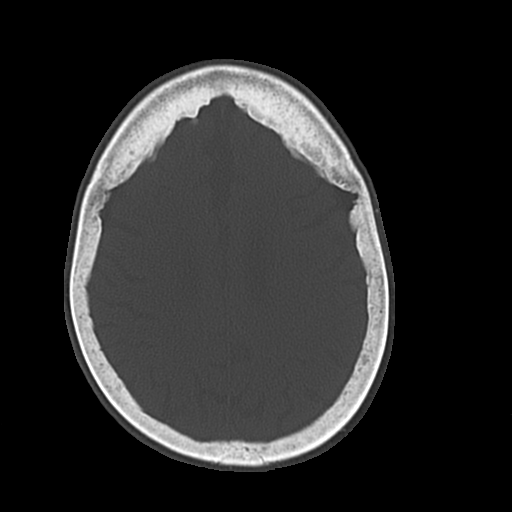
[im 41/53  bone]
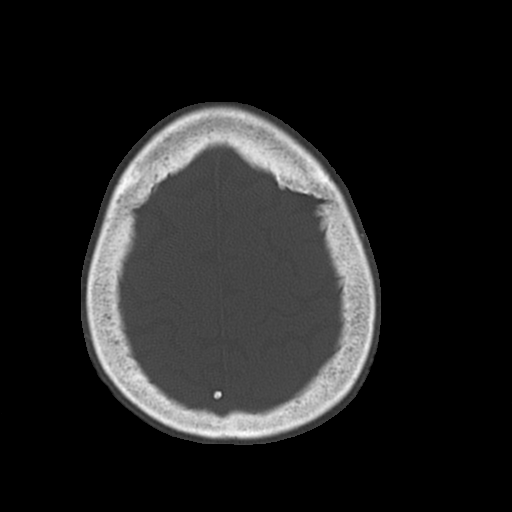

[16 of 30 positions shown; findings below may reference images not displayed]

FINDINGS: Brain: No intracranial hemorrhage, mass effect or midline shift.
Stable mild cerebral atrophy. Mild periventricular white matter
decreased attenuation probable due to chronic small vessel ischemic
changes. No acute cortical infarction. No mass lesion is noted on
this unenhanced scan. The gray and white-matter differentiation is
preserved.

Vascular: Atherosclerotic calcifications of carotid siphon are
noted.

Skull: No skull fracture.

Sinuses/Orbits: No paranasal sinuses air-fluid levels. The mastoid
air cells are unremarkable.

Other: None
IMPRESSION: No acute intracranial abnormality. Mild cerebral atrophy. Mild
periventricular white matter decreased attenuation probable due to
chronic small vessel ischemic changes. No definite acute cortical
infarction.

## 2018-02-19 ENCOUNTER — Ambulatory Visit (HOSPITAL_COMMUNITY)
Admission: RE | Admit: 2018-02-19 | Discharge: 2018-02-19 | Disposition: A | Discharge status: Home / Self Care | Payer: Medicare Other | Source: Ambulatory Visit | Attending: Internal Medicine | Admitting: Internal Medicine

## 2018-02-19 VITALS — BP 138/65 | HR 96 | Temp 97.8°F | Resp 20

## 2018-02-19 DIAGNOSIS — N189 Chronic kidney disease, unspecified: Secondary | ICD-10-CM | POA: Diagnosis not present

## 2018-02-19 DIAGNOSIS — D631 Anemia in chronic kidney disease: Secondary | ICD-10-CM | POA: Diagnosis not present

## 2018-02-19 LAB — POCT HEMOGLOBIN-HEMACUE: HEMOGLOBIN: 10.8 g/dL — AB (ref 12.0–15.0)

## 2018-02-19 MED ORDER — EPOETIN ALFA 20000 UNIT/ML IJ SOLN
INTRAMUSCULAR | Status: AC
  Administered 2018-02-19: 20000 [IU]
  Filled 2018-02-19: qty 1

## 2018-02-19 MED ORDER — EPOETIN ALFA 10000 UNIT/ML IJ SOLN
INTRAMUSCULAR | Status: AC
  Administered 2018-02-19: 10000 [IU]
  Filled 2018-02-19: qty 1

## 2018-02-19 MED ORDER — EPOETIN ALFA 10000 UNIT/ML IJ SOLN: 30000.0000 [IU] | INTRAMUSCULAR | Status: DC

## 2018-03-15 ENCOUNTER — Other Ambulatory Visit: Payer: Self-pay | Admitting: Nurse Practitioner

## 2018-03-15 ENCOUNTER — Other Ambulatory Visit (HOSPITAL_COMMUNITY): Payer: Self-pay | Admitting: *Deleted

## 2018-03-18 ENCOUNTER — Ambulatory Visit (HOSPITAL_COMMUNITY)
Admission: RE | Admit: 2018-03-18 | Discharge: 2018-03-18 | Disposition: A | Discharge status: Home / Self Care | Payer: Medicare Other | Source: Ambulatory Visit | Attending: Internal Medicine | Admitting: Internal Medicine

## 2018-03-18 VITALS — BP 143/66 | HR 93 | Temp 97.7°F | Resp 20

## 2018-03-18 DIAGNOSIS — D631 Anemia in chronic kidney disease: Secondary | ICD-10-CM | POA: Diagnosis not present

## 2018-03-18 DIAGNOSIS — N189 Chronic kidney disease, unspecified: Secondary | ICD-10-CM | POA: Diagnosis not present

## 2018-03-18 LAB — POCT HEMOGLOBIN-HEMACUE: HEMOGLOBIN: 11.3 g/dL — AB (ref 12.0–15.0)

## 2018-03-18 MED ORDER — EPOETIN ALFA 10000 UNIT/ML IJ SOLN: 30000.0000 [IU] | INTRAMUSCULAR | Status: DC

## 2018-03-19 ENCOUNTER — Encounter (HOSPITAL_COMMUNITY): Payer: Medicare Other

## 2018-03-25 NOTE — Progress Notes (Signed)
CARDIOLOGY OFFICE NOTE  Date:  03/26/2018    Leslie Coleman Date of Birth: 10-14-1928 Medical Record #[id number]  PCP:   Cardiologist:  Servando Snare & Allred    Chief Complaint  Patient presents with  . Irregular Heart Beat    Follow up visit - seen for Dr. Rayann Heman    History of Present Illness: Leslie Coleman is a 84 y.o. female who presents today for a follow up visit. Seen for Dr. Rayann Heman - former patient of Dr. Susa Simmonds. She primarily follows with me.   She has a remote history of VT - treated initially with procainamide but switched to amiodarone back in 2008 due to unavailability of the medicine. Does have chronically abnormal PFTs. Other issues include HTN, gout, obesity, DM and anemia. On chronic Procrit.   I have followed her thru the years. She has gotten progressively feeble. More falls. She has had a broken leg due to a fall and required SNF stay. More sedentary. No longer on beta blocker due to bradycardia. She has had issues with swelling as well as dehydration. Medicines have always been a little difficult to discern at times.   Comes in today. Here with family today. They do not know if she is on her Norvasc or not. We have called - she is taking and it is in her pill pak. BP is high today. She has had her medicines today. Eating more fast food - including salt. Getting sausage/bacon about every morning. Not cooking anymore. Did have another fall in her kitchen - fell on her "tailbone" - seems ok. Looking for a new PCP. No chest pain. Breathing seems ok.   Past Medical History:  Diagnosis Date  . Abnormal PFTs (pulmonary function tests) 2008   No obstruction noted. Has been followed clinically.  . Anemia    treated with Procrit  . Fall from slip, trip, or stumble 05/07/2016   [place] resulting in right tibial plateau fracture  . Gout   . High risk medication use    on amiodarone.  Marland Kitchen HTN (hypertension)    Last echo in 2004; EF normal  . Hyperlipidemia     . Hypothyroidism    on replacement  . Irregular heart beat "since 1985"  . Obesity   . Type II diabetes mellitus ([place])   . V-tach [facility])    remote VT, treated initially with procainamide, on amiodarone since 2008    Past Surgical History:  Procedure Laterality Date  . CARDIOVASCULAR STRESS TEST  07/07/2003   EF 70%. NO EVIDENCE OF ISCHEMIA  . CARDIOVERSION  1978  . CATARACT EXTRACTION, BILATERAL Bilateral   . CATARACT EXTRACTION, BILATERAL Bilateral   . CESAREAN SECTION  1957; 1961; 1967  . FRACTURE SURGERY    . ORIF TIBIA PLATEAU Right 05/10/2016   Procedure: OPEN REDUCTION INTERNAL FIXATION (ORIF) RIGHT TIBIAL PLATEAU  WITH BONE GRAFT;  Surgeon: Leandrew Koyanagi, MD;  Location: [place];  Service: Orthopedics;  Laterality: Right;  . US ECHOCARDIOGRAPHY  12/29/2002   EF 60-65%  . VAGINAL HYSTERECTOMY       Medications: Current Meds  Medication Sig  . amiodarone (PACERONE) 100 MG tablet Take 1 tablet (100 mg total) by mouth every morning.  Marland Kitchen aspirin 81 MG EC tablet Take 1 tablet (81 mg total) by mouth daily.  Marland Kitchen Epoetin Alfa (PROCRIT IJ) Inject 10,000 Units as directed every 28 (twenty-eight) days.   . furosemide (LASIX) 20 MG tablet Take 1 tablet by  mouth daily.  Marland Kitchen glipiZIDE (GLUCOTROL XL) 5 MG 24 hr tablet Take 5 mg by mouth daily.  . hydrALAZINE (APRESOLINE) 50 MG tablet TAKE 1 TABLET BY MOUTH TWICE A DAY  . levothyroxine (SYNTHROID, LEVOTHROID) 100 MCG tablet Take 1 tablet (100 mcg total) by mouth daily before breakfast.  . Melatonin 5 MG CAPS Take 5 mg by mouth daily.  . Multiple Vitamin (MULTIVITAMIN WITH MINERALS) TABS tablet Take 1 tablet by mouth daily.  Marland Kitchen nystatin (MYCOSTATIN/NYSTOP) powder Apply topically 3 (three) times daily.  Marland Kitchen omega-3 acid ethyl esters (LOVAZA) 1 g capsule Take 2 capsules by mouth twice daily.  . potassium chloride (K-DUR) 10 MEQ tablet Take 10 mEq by mouth every morning.   . pravastatin (PRAVACHOL) 20 MG tablet TAKE 1 TABLET BY MOUTH AT BEDTIME      Allergies: No Known Allergies  Social History: The patient  reports that she has never smoked. She has never used smokeless tobacco. She reports that she does not drink alcohol or use drugs.   Family History: The patient's family history includes Heart disease in her father and mother.   Review of Systems: Please see the history of present illness.   Otherwise, the review of systems is positive for none.   All other systems are reviewed and negative.   Physical Exam: VS:  BP (!) 170/70 (BP Location: Left Arm, Patient Position: Sitting, Cuff Size: Normal)   Pulse 92   Ht 5\' 2"  (1.575 m)   Wt 159 lb 1.9 oz (72.2 kg)   BMI 29.10 kg/m  .  BMI Body mass index is 29.1 kg/m.  Wt Readings from Last 3 Encounters:  03/26/18 159 lb 1.9 oz (72.2 kg)  12/25/17 162 lb 1.9 oz (73.5 kg)  08/27/17 169 lb 12.8 oz (77 kg)    General: Elderly. Alert and in no acute distress. She is in a wheelchair.   HEENT: Normal.  Neck: Supple, no JVD, carotid bruits, or masses noted.  Cardiac: Regular rate and rhythm. No murmurs, rubs, or gallops. Trace edema.  Respiratory:  Lungs are clear to auscultation bilaterally with normal work of breathing.  GI: Soft and nontender.  MS: No deformity or atrophy. Gait not tested.  Skin: Warm and dry. Color is normal.  Neuro:  Strength and sensation are intact and no gross focal deficits noted.  Psych: Alert, appropriate and with normal affect.   LABORATORY DATA:  EKG:  EKG is ordered today. This demonstrates NSR - her HR today is 92.  Lab Results  Component Value Date   WBC 6.3 12/25/2017   HGB 11.3 (L) 03/18/2018   HCT 33.0 (L) 12/25/2017   PLT 178 12/25/2017   GLUCOSE 240 (H) 12/25/2017   CHOL 104 12/25/2017   TRIG 285 (H) 12/25/2017   HDL 19 (L) 12/25/2017   LDLCALC 28 12/25/2017   ALT 13 12/25/2017   AST 22 12/25/2017   NA 137 12/25/2017   K 4.0 12/25/2017   CL 100 12/25/2017   CREATININE 2.10 (H) 12/25/2017   BUN 19 12/25/2017   CO2 19  (L) 12/25/2017   TSH 0.675 12/25/2017   INR 1.11 06/18/2014   HGBA1C 6.0 (H) 10/19/2016     BNP (last 3 results) No results for input(s): BNP in the last 8760 hours.  ProBNP (last 3 results) No results for input(s): PROBNP in the last 8760 hours.   Other Studies Reviewed Today:  Echo Study Conclusions from September 2015  - Left ventricle: The cavity size was normal.  Wall thickness was normal. Systolic function was normal. The estimated ejection fraction was in the range of 60% to 65%. Left ventricular diastolic function parameters were normal. - Mitral valve: There was mild regurgitation. - Left atrium: The atrium was mildly dilated. - Right atrium: The atrium was mildly dilated.  Assessment / Plan:  1.HTN - BP recheck by me is improved - I suspect she is getting way too much salt - doubt this will change. No changes made today. Her medicines seem correct.    2. History of VT - maintained on chronic low dose amiodarone therapy -no longer on Atenolol due to bradycardia - EKG is stable today. No changes made. Needs surveillance labs.   3. Asymptomatic bradycardia - resolved with cessation of beta blocker.Would not restart.  4. HLD - on statin therapy with Lovaza - may consider stopping on return.   5. Mild LV dysfunction - her last echo showed normal EF. She is not short of breath - she has dependent edema from being sedentary.I would favor continuing with conservative management. No changes made today.  6. Hypothyoidism -rechecking TSH today - remains on low dose amiodarone.   7. History of abnormal PFTs - just followed clinically - fortunately, her shortness of breath seems stable. Again, not really notedor endorsedtoday.  Current medicines are reviewed with the patient today.  The patient does not have concerns regarding medicines other than what has been noted above.  The following changes have been made:  See above.  Labs/ tests ordered today  include:    Orders Placed This Encounter  Procedures  . Basic metabolic panel  . Hepatic function panel  . TSH  . EKG 12-Lead     Disposition:   FU with me in 4 months.   Patient is agreeable to this plan and will call if any problems develop in the interim.   SignedTruitt Merle, NP  03/26/2018 9:48 AM  [facility] [address] [place], [place]  [address] Phone: [phone] Fax: [phone]

## 2018-03-26 ENCOUNTER — Telehealth: Payer: Self-pay | Admitting: *Deleted

## 2018-03-26 ENCOUNTER — Ambulatory Visit (INDEPENDENT_AMBULATORY_CARE_PROVIDER_SITE_OTHER): Payer: Medicare Other | Admitting: Nurse Practitioner

## 2018-03-26 ENCOUNTER — Encounter: Payer: Self-pay | Admitting: Nurse Practitioner

## 2018-03-26 VITALS — BP 170/70 | HR 92 | Ht 62.0 in | Wt 159.1 lb

## 2018-03-26 DIAGNOSIS — E038 Other specified hypothyroidism: Secondary | ICD-10-CM

## 2018-03-26 DIAGNOSIS — I472 Ventricular tachycardia, unspecified: Secondary | ICD-10-CM

## 2018-03-26 DIAGNOSIS — Z23 Encounter for immunization: Secondary | ICD-10-CM | POA: Diagnosis not present

## 2018-03-26 DIAGNOSIS — Z79899 Other long term (current) drug therapy: Secondary | ICD-10-CM | POA: Diagnosis not present

## 2018-03-26 DIAGNOSIS — I519 Heart disease, unspecified: Secondary | ICD-10-CM | POA: Diagnosis not present

## 2018-03-26 DIAGNOSIS — E7800 Pure hypercholesterolemia, unspecified: Secondary | ICD-10-CM

## 2018-03-26 DIAGNOSIS — R001 Bradycardia, unspecified: Secondary | ICD-10-CM

## 2018-03-26 DIAGNOSIS — I10 Essential (primary) hypertension: Secondary | ICD-10-CM

## 2018-03-26 LAB — BASIC METABOLIC PANEL
BUN/Creatinine Ratio: 8 — ABNORMAL LOW (ref 12–28)
BUN: 18 mg/dL (ref 8–27)
CO2: 19 mmol/L — ABNORMAL LOW (ref 20–29)
Calcium: 8.8 mg/dL (ref 8.7–10.3)
Chloride: 101 mmol/L (ref 96–106)
Creatinine, Ser: 2.14 mg/dL — ABNORMAL HIGH (ref 0.57–1.00)
GFR calc Af Amer: 23 mL/min/{1.73_m2} — ABNORMAL LOW (ref >59)
GFR calc non Af Amer: 20 mL/min/{1.73_m2} — ABNORMAL LOW (ref >59)
Glucose: 198 mg/dL — ABNORMAL HIGH (ref 65–99)
Potassium: 4.2 mmol/L (ref 3.5–5.2)
Sodium: 138 mmol/L (ref 134–144)

## 2018-03-26 LAB — HEPATIC FUNCTION PANEL
ALT: 14 IU/L (ref 0–32)
AST: 20 IU/L (ref 0–40)
Albumin: 3.5 g/dL (ref 3.5–4.7)
Alkaline Phosphatase: 81 IU/L (ref 39–117)
Bilirubin Total: 0.3 mg/dL (ref 0.0–1.2)
Bilirubin, Direct: 0.1 mg/dL (ref 0.00–0.40)
Total Protein: 6.2 g/dL (ref 6.0–8.5)

## 2018-03-26 LAB — TSH: TSH: 0.403 u[IU]/mL — ABNORMAL LOW (ref 0.450–4.500)

## 2018-03-26 MED ORDER — AMLODIPINE BESYLATE 5 MG PO TABS: 5.0000 mg | ORAL_TABLET | Freq: Every day | ORAL | 3 refills | Status: DC

## 2018-03-26 NOTE — Patient Instructions (Addendum)
We will be checking the following labs today - BMET, HPF and TSH  If you have labs (blood work) drawn today and your tests are completely normal, you will receive your results only by: Marland Kitchen MyChart Message (if you have MyChart) OR . A paper copy in the mail If you have any lab test that is abnormal or we need to change your treatment, we will call you to review the results.   Medication Instructions:    Continue with your current medicines.    If you need a refill on your cardiac medications before your next appointment, please call your pharmacy.     Testing/Procedures To Be Arranged:  N/A  Follow-Up:   See me in about 4 months.     At [facility], you and your health needs are our priority.  As part of our continuing mission to provide you with exceptional heart care, we have created designated Provider Care Teams.  These Care Teams include your primary Cardiologist (physician) and Advanced Practice Providers (APPs -  Physician Assistants and Nurse Practitioners) who all work together to provide you with the care you need, when you need it.  Special Instructions:  . None  Call the [place] office at [phone] if you have any questions, problems or concerns.

## 2018-03-26 NOTE — Telephone Encounter (Signed)
S/w  at the [facility] stated pt is taking amlodipine (5 mg ) daily.

## 2018-04-15 ENCOUNTER — Ambulatory Visit (HOSPITAL_COMMUNITY)
Admission: RE | Admit: 2018-04-15 | Discharge: 2018-04-15 | Disposition: A | Discharge status: Home / Self Care | Payer: Medicare Other | Source: Ambulatory Visit | Attending: Internal Medicine | Admitting: Internal Medicine

## 2018-04-15 VITALS — BP 128/99 | HR 99 | Temp 97.8°F | Resp 20

## 2018-04-15 DIAGNOSIS — D631 Anemia in chronic kidney disease: Secondary | ICD-10-CM | POA: Diagnosis not present

## 2018-04-15 DIAGNOSIS — N189 Chronic kidney disease, unspecified: Secondary | ICD-10-CM | POA: Insufficient documentation

## 2018-04-15 LAB — POCT HEMOGLOBIN-HEMACUE: Hemoglobin: 11 g/dL — ABNORMAL LOW (ref 12.0–15.0)

## 2018-04-15 MED ORDER — EPOETIN ALFA 10000 UNIT/ML IJ SOLN: 30000.0000 [IU] | INTRAMUSCULAR | Status: DC

## 2018-04-15 MED ORDER — EPOETIN ALFA 10000 UNIT/ML IJ SOLN
INTRAMUSCULAR | Status: AC
  Administered 2018-04-15: 10000 [IU] via SUBCUTANEOUS
  Filled 2018-04-15: qty 1

## 2018-04-15 MED ORDER — EPOETIN ALFA 20000 UNIT/ML IJ SOLN
INTRAMUSCULAR | Status: AC
  Administered 2018-04-15: 20000 [IU] via SUBCUTANEOUS
  Filled 2018-04-15: qty 1

## 2018-05-10 ENCOUNTER — Other Ambulatory Visit (HOSPITAL_COMMUNITY): Payer: Self-pay

## 2018-05-13 ENCOUNTER — Encounter (HOSPITAL_COMMUNITY): Payer: Self-pay

## 2018-05-13 ENCOUNTER — Ambulatory Visit (HOSPITAL_COMMUNITY): Admission: RE | Admit: 2018-05-13 | Payer: Medicare Other | Source: Ambulatory Visit

## 2018-05-20 ENCOUNTER — Ambulatory Visit (HOSPITAL_COMMUNITY)
Admission: RE | Admit: 2018-05-20 | Discharge: 2018-05-20 | Disposition: A | Discharge status: Home / Self Care | Payer: Medicare Other | Source: Ambulatory Visit | Attending: Internal Medicine | Admitting: Internal Medicine

## 2018-05-20 VITALS — BP 135/66 | HR 98 | Temp 98.6°F | Resp 20

## 2018-05-20 DIAGNOSIS — D631 Anemia in chronic kidney disease: Secondary | ICD-10-CM | POA: Diagnosis not present

## 2018-05-20 DIAGNOSIS — N189 Chronic kidney disease, unspecified: Secondary | ICD-10-CM | POA: Diagnosis not present

## 2018-05-20 LAB — POCT HEMOGLOBIN-HEMACUE: Hemoglobin: 11 g/dL — ABNORMAL LOW (ref 12.0–15.0)

## 2018-05-20 MED ORDER — EPOETIN ALFA 10000 UNIT/ML IJ SOLN: 30000.0000 [IU] | INTRAMUSCULAR | Status: DC

## 2018-06-10 ENCOUNTER — Encounter (HOSPITAL_COMMUNITY): Payer: Medicare Other

## 2018-06-17 ENCOUNTER — Encounter (HOSPITAL_COMMUNITY): Payer: Medicare Other

## 2018-06-17 ENCOUNTER — Other Ambulatory Visit: Payer: Self-pay | Admitting: Nurse Practitioner

## 2018-06-24 ENCOUNTER — Encounter (HOSPITAL_COMMUNITY)
Admission: RE | Admit: 2018-06-24 | Discharge: 2018-06-24 | Disposition: A | Discharge status: Home / Self Care | Payer: Medicare Other | Source: Ambulatory Visit | Attending: Internal Medicine | Admitting: Internal Medicine

## 2018-06-24 VITALS — BP 130/70 | HR 79 | Temp 97.8°F | Resp 20

## 2018-06-24 DIAGNOSIS — D631 Anemia in chronic kidney disease: Secondary | ICD-10-CM | POA: Diagnosis not present

## 2018-06-24 DIAGNOSIS — N189 Chronic kidney disease, unspecified: Secondary | ICD-10-CM | POA: Diagnosis not present

## 2018-06-24 LAB — POCT HEMOGLOBIN-HEMACUE: HEMOGLOBIN: 10.5 g/dL — AB (ref 12.0–15.0)

## 2018-06-24 MED ORDER — EPOETIN ALFA 10000 UNIT/ML IJ SOLN
INTRAMUSCULAR | Status: AC
  Filled 2018-06-24: qty 1

## 2018-06-24 MED ORDER — EPOETIN ALFA 20000 UNIT/ML IJ SOLN
INTRAMUSCULAR | Status: AC
  Filled 2018-06-24: qty 1

## 2018-06-24 MED ORDER — EPOETIN ALFA 10000 UNIT/ML IJ SOLN
30000.0000 [IU] | INTRAMUSCULAR | Status: DC
  Administered 2018-06-24: 30000 [IU] via SUBCUTANEOUS

## 2018-06-25 MED FILL — Epoetin Alfa Inj 20000 Unit/ML: INTRAMUSCULAR | Qty: 1 | Status: AC

## 2018-06-25 MED FILL — Epoetin Alfa Inj 10000 Unit/ML: INTRAMUSCULAR | Qty: 1 | Status: AC

## 2018-07-15 ENCOUNTER — Other Ambulatory Visit: Payer: Self-pay | Admitting: Nurse Practitioner

## 2018-07-15 ENCOUNTER — Encounter (HOSPITAL_COMMUNITY): Payer: Medicare Other

## 2018-07-19 ENCOUNTER — Other Ambulatory Visit (HOSPITAL_COMMUNITY): Payer: Self-pay | Admitting: *Deleted

## 2018-07-22 ENCOUNTER — Ambulatory Visit (HOSPITAL_COMMUNITY)
Admission: RE | Admit: 2018-07-22 | Discharge: 2018-07-22 | Disposition: A | Discharge status: Home / Self Care | Payer: Medicare Other | Source: Ambulatory Visit | Attending: Internal Medicine | Admitting: Internal Medicine

## 2018-07-22 VITALS — BP 134/87 | HR 97 | Temp 98.1°F | Resp 20

## 2018-07-22 DIAGNOSIS — D631 Anemia in chronic kidney disease: Secondary | ICD-10-CM | POA: Diagnosis not present

## 2018-07-22 DIAGNOSIS — N189 Chronic kidney disease, unspecified: Secondary | ICD-10-CM | POA: Insufficient documentation

## 2018-07-22 LAB — POCT HEMOGLOBIN-HEMACUE: Hemoglobin: 10.5 g/dL — ABNORMAL LOW (ref 12.0–15.0)

## 2018-07-22 MED ORDER — EPOETIN ALFA 10000 UNIT/ML IJ SOLN: 30000.0000 [IU] | INTRAMUSCULAR | Status: DC

## 2018-07-22 MED ORDER — EPOETIN ALFA 20000 UNIT/ML IJ SOLN
INTRAMUSCULAR | Status: AC
  Administered 2018-07-22: 20000 [IU] via SUBCUTANEOUS
  Filled 2018-07-22: qty 1

## 2018-07-22 MED ORDER — EPOETIN ALFA 10000 UNIT/ML IJ SOLN
INTRAMUSCULAR | Status: AC
  Administered 2018-07-22: 10000 [IU] via SUBCUTANEOUS
  Filled 2018-07-22: qty 1

## 2018-08-06 ENCOUNTER — Ambulatory Visit: Payer: Medicare Other | Admitting: Nurse Practitioner

## 2018-08-19 ENCOUNTER — Encounter (HOSPITAL_COMMUNITY): Payer: Medicare Other

## 2018-09-13 ENCOUNTER — Other Ambulatory Visit: Payer: Self-pay | Admitting: Nurse Practitioner

## 2018-10-09 ENCOUNTER — Telehealth: Payer: Self-pay

## 2018-10-09 NOTE — Telephone Encounter (Signed)
I did an Aspirin PA through covermymeds. Key: [id number]   Letter received via fax from [place] stating that they have denied the pts Aspirin PA. Reason: Not covered by the pts plan/plan exclusion   I did notify Lattie Haw at [facility] of this denial. Lattie Haw states that she will contact the pt.

## 2018-10-21 ENCOUNTER — Telehealth: Payer: Self-pay | Admitting: *Deleted

## 2018-10-21 NOTE — Progress Notes (Signed)
Telehealth Visit     Virtual Visit via Video Note   This visit type was conducted due to national recommendations for restrictions regarding the COVID-19 Pandemic (e.g. social distancing) in an effort to limit this patient's exposure and mitigate transmission in our community.  Due to her co-morbid illnesses, this patient is at least at moderate risk for complications without adequate follow up.  This format is felt to be most appropriate for this patient at this time.  All issues noted in this document were discussed and addressed.  A limited physical exam was performed with this format.  Please refer to the patient's chart for her consent to telehealth for [facility].   Evaluation Performed:  Follow-up visit  This visit type was conducted due to national recommendations for restrictions regarding the COVID-19 Pandemic (e.g. social distancing).  This format is felt to be most appropriate for this patient at this time.  All issues noted in this document were discussed and addressed.  No physical exam was performed (except for noted visual exam findings with Video Visits).  Please refer to the patient's chart (MyChart message for video visits and phone note for telephone visits) for the patient's consent to telehealth for [facility]  Date:  10/22/2018   ID:  Leslie Coleman, DOB 05-13-1929, MRN [id number]  Patient Location:  Home  Provider location:   Home  PCP:  Burtis Junes, NP  Cardiologist:  Servando Snare &  Electrophysiologist:  None   Chief Complaint:  Follow up  History of Present Illness:    Leslie Coleman is a 85 y.o. female who presents via audio/video conferencing for a telehealth visit today.  Seen for Dr. Rayann Heman - former patient of Dr. Susa Simmonds.She primarily follows with me.  She has a remote history of VT - treated initially with procainamide but switched to amiodarone back in 2008 due to unavailability of the medicine. Does have chronically abnormal PFTs. Other  issues include HTN, gout, obesity, DM and anemia. On chronic Procrit.   I have followed her thru the years. She has gotten progressively feeble. More falls. She has had a broken leg due to a fall and required SNF stay. More sedentary. No longer on beta blocker due to bradycardia. She has had issues with swelling as well as dehydration. Medicines have always been a little difficult to discern at times. Last seen in November - felt to be ok but clearly getting more salt/fast food - she had had another fall. Fortunately, cardiac status felt to be ok.   The patient does not have symptoms concerning for COVID-19 infection (fever, chills, cough, or new shortness of breath).   Seen today via [employer] video - with family assistance from [place]. She has consented for this visit. BP is up today. Typically BP is much lower as a routine. She says she got mad at [facility] earlier this morning and this is why BP is up. No real concerns. Weight is down a few more pounds. She was pretty much home bound even prior to the [place]. No falls - she is using her walker. Still likes her soda. Rare dizziness. Has not had any labs since our last visit 7 months ago. Leslie Coleman feels like everything is stable. No refills needed. Tolerating her current medicines. She did get her shot for her blood count in early March.   Past Medical History:  Diagnosis Date   Abnormal PFTs (pulmonary function tests) 2008   No obstruction noted. Has been followed clinically.  Anemia    treated with Procrit   Fall from slip, trip, or stumble 05/07/2016   [place] in [place] plateau fracture   Gout    High risk medication use    on amiodarone.   HTN (hypertension)    Last echo in 2004; EF normal   Hyperlipidemia    Hypothyroidism    on replacement   Irregular heart beat "since 1985"   Obesity    Type II diabetes mellitus ([place])    V-tach ([place])    remote VT, treated initially with procainamide, on amiodarone  since 2008   Past Surgical History:  Procedure Laterality Date   CARDIOVASCULAR STRESS TEST  07/07/2003   EF 70%. NO EVIDENCE OF ISCHEMIA   CARDIOVERSION  1978   CATARACT EXTRACTION, BILATERAL Bilateral    CATARACT EXTRACTION, BILATERAL Bilateral    CESAREAN SECTION  1957; 1961; 1967   FRACTURE SURGERY     ORIF TIBIA PLATEAU Right 05/10/2016   Procedure: OPEN REDUCTION INTERNAL FIXATION (ORIF) RIGHT TIBIAL PLATEAU  WITH BONE GRAFT;  Surgeon: Leandrew Koyanagi, MD;  Location: [place];  Service: Orthopedics;  Laterality: Right;   US ECHOCARDIOGRAPHY  12/29/2002   EF 60-65%   VAGINAL HYSTERECTOMY       Current Meds  Medication Sig   amiodarone (PACERONE) 100 MG tablet Take 1 tablet (100 mg total) by mouth every morning.   amLODipine (NORVASC) 5 MG tablet Take 1 tablet (5 mg total) by mouth daily.   aspirin 81 MG EC tablet Take 1 tablet (81 mg total) by mouth daily.   Epoetin Alfa (PROCRIT IJ) Inject 10,000 Units as directed every 28 (twenty-eight) days.    furosemide (LASIX) 20 MG tablet Take 1 tablet by mouth daily.   glipiZIDE (GLUCOTROL XL) 5 MG 24 hr tablet Take 5 mg by mouth daily.   hydrALAZINE (APRESOLINE) 50 MG tablet TAKE 1 TABLET BY MOUTH TWICE A DAY   levothyroxine (SYNTHROID) 100 MCG tablet Take 1 tablet (100 mcg total) by mouth daily before breakfast.   Multiple Vitamin (MULTIVITAMIN WITH MINERALS) TABS tablet Take 1 tablet by mouth daily.   omega-3 acid ethyl esters (LOVAZA) 1 g capsule Take 2 capsules by mouth twice daily.   potassium chloride (K-DUR) 10 MEQ tablet Take 10 mEq by mouth every morning.    pravastatin (PRAVACHOL) 20 MG tablet TAKE 1 TABLET BY MOUTH AT BEDTIME     Allergies:   Patient has no known allergies.   Social History   Tobacco Use   Smoking status: Never Smoker   Smokeless tobacco: Never Used  Substance Use Topics   Alcohol use: No   Drug use: No     Family Hx: The patient's family history includes Heart disease in her  father and mother.  ROS:   Please see the history of present illness.   All other systems reviewed are negative.    Objective:    Vital Signs:  BP (!) 169/80    Pulse 80    Ht 5\' 2"  (1.575 m)    Wt 156 lb (70.8 kg)    BMI 28.53 kg/m    Wt Readings from Last 3 Encounters:  10/22/18 156 lb (70.8 kg)  03/26/18 159 lb 1.9 oz (72.2 kg)  12/25/17 162 lb 1.9 oz (73.5 kg)    Alert female in no acute distress. Not short of breath with conversation. She is elderly. Looks frail.    Labs/Other Tests and Data Reviewed:    Lab  Results  Component Value Date   WBC 6.3 12/25/2017   HGB 10.5 (L) 07/22/2018   HCT 33.0 (L) 12/25/2017   PLT 178 12/25/2017   GLUCOSE 198 (H) 03/26/2018   CHOL 104 12/25/2017   TRIG 285 (H) 12/25/2017   HDL 19 (L) 12/25/2017   LDLCALC 28 12/25/2017   ALT 14 03/26/2018   AST 20 03/26/2018   NA 138 03/26/2018   K 4.2 03/26/2018   CL 101 03/26/2018   CREATININE 2.14 (H) 03/26/2018   BUN 18 03/26/2018   CO2 19 (L) 03/26/2018   TSH 0.403 (L) 03/26/2018   INR 1.11 06/18/2014   HGBA1C 6.0 (H) 10/19/2016     BNP (last 3 results) No results for input(s): BNP in the last 8760 hours.  ProBNP (last 3 results) No results for input(s): PROBNP in the last 8760 hours.    Prior CV studies:    The following studies were reviewed today:  Echo Study Conclusions from September 2015  - Left ventricle: The cavity size was normal. Wall thickness was normal. Systolic function was normal. The estimated ejection fraction was in the range of 60% to 65%. Left ventricular diastolic function parameters were normal. - Mitral valve: There was mild regurgitation. - Left atrium: The atrium was mildly dilated. - Right atrium: The atrium was mildly dilated.   ASSESSMENT & PLAN:    1.HTN -BP is up today - apparently got upset earlier - typically much lower. Will follow for now.   2. History of VT - maintained on chronic low dose amiodarone therapy -no longer on  Atenolol due to bradycardia - will need EKG on return. HR is stable. Needs labs.   3. Asymptomatic bradycardia - resolved with cessation of beta blocker.Would not restart.HR is fine today. .  4. HLD - on statin therapywith Lovaza - still may need to consider stopping on return.   5. Mild LV dysfunction - her last echo showed normal EF. Denies being short of breath. Weight is down a few more pounds. Conservative management. No changes made today.   6. Hypothyoidism -on replacement - needs labs. She remains on low dose amiodarone.   7. History of abnormal PFTs - just followed clinically - fortunately, her shortness of breath seems stable. Again, not really notedor endorsedtoday.  8. COVID-19 Education: The signs and symptoms of COVID-19 were discussed with the patient and how to seek care for testing (follow up with PCP or arrange E-visit).  The importance of social distancing, staying at home, hand hygiene and wearing a mask when out in public were discussed today.  Patient Risk:   After full review of this patient's clinical status, I feel that they are at least moderate risk at this time.  Time:   Today, I have spent 8 minutes with the patient with telehealth technology discussing the above issues.     Medication Adjustments/Labs and Tests Ordered: Current medicines are reviewed at length with the patient today.  Concerns regarding medicines are outlined above.   Tests Ordered: No orders of the defined types were placed in this encounter.   Medication Changes: No orders of the defined types were placed in this encounter.   Disposition:  FU with me in 4 months with OV, EKG and labs. We will arrange for labs at [facility] this Thursday.   Patient is agreeable to this plan and will call if any problems develop in the interim.   Amie Critchley, NP  10/22/2018 9:31 AM  [facility]

## 2018-10-21 NOTE — Telephone Encounter (Signed)

## 2018-10-22 ENCOUNTER — Other Ambulatory Visit: Payer: Self-pay

## 2018-10-22 ENCOUNTER — Telehealth (INDEPENDENT_AMBULATORY_CARE_PROVIDER_SITE_OTHER): Payer: Medicare Other | Admitting: Nurse Practitioner

## 2018-10-22 ENCOUNTER — Encounter: Payer: Self-pay | Admitting: Nurse Practitioner

## 2018-10-22 VITALS — BP 169/80 | HR 80 | Ht 62.0 in | Wt 156.0 lb

## 2018-10-22 DIAGNOSIS — R001 Bradycardia, unspecified: Secondary | ICD-10-CM

## 2018-10-22 DIAGNOSIS — I519 Heart disease, unspecified: Secondary | ICD-10-CM

## 2018-10-22 DIAGNOSIS — Z79899 Other long term (current) drug therapy: Secondary | ICD-10-CM | POA: Diagnosis not present

## 2018-10-22 DIAGNOSIS — I472 Ventricular tachycardia, unspecified: Secondary | ICD-10-CM

## 2018-10-22 DIAGNOSIS — Z7189 Other specified counseling: Secondary | ICD-10-CM

## 2018-10-22 DIAGNOSIS — I10 Essential (primary) hypertension: Secondary | ICD-10-CM

## 2018-10-22 DIAGNOSIS — E7800 Pure hypercholesterolemia, unspecified: Secondary | ICD-10-CM

## 2018-10-22 NOTE — Patient Instructions (Addendum)
After Visit Summary:  We will be checking the following labs today - NONE  Lab on Thursday at [place], HPF, Lipids and TSH   Medication Instructions:    Continue with your current medicines.    If you need a refill on your cardiac medications before your next appointment, please call your pharmacy.     Testing/Procedures To Be Arranged:  N/A  Follow-Up:   See me in 4 months with OV, EKG and labs.     At [facility], you and your health needs are our priority.  As part of our continuing mission to provide you with exceptional heart care, we have created designated Provider Care Teams.  These Care Teams include your primary Cardiologist (physician) and Advanced Practice Providers (APPs -  Physician Assistants and Nurse Practitioners) who all work together to provide you with the care you need, when you need it.  Special Instructions:  . Stay safe, stay home, wash your hands for at least 20 seconds and wear a mask when out in public.  . It was good to talk with you both today.    Call the [place] office at [phone] if you have any questions, problems or concerns.

## 2018-10-23 ENCOUNTER — Telehealth: Payer: Self-pay | Admitting: *Deleted

## 2018-10-23 NOTE — Telephone Encounter (Signed)
    COVID-19 Pre-Screening Questions:  . In the past 7 to 10 days have you had a cough,  shortness of breath, headache, congestion, fever (100 or greater) body aches, chills, sore throat, or sudden loss of taste or sense of smell? . Have you been around anyone with known Covid 19. . Have you been around anyone who is awaiting Covid 19 test results in the past 7 to 10 days? . Have you been around anyone who has been exposed to Covid 19, or has mentioned symptoms of Covid 19 within the past 7 to 10 days?  If you have any concerns/questions about symptoms patients report during screening (either on the phone or at [facility]). Contact the provider seeing the patient or DOD for further guidance.  If neither are available contact a member of the leadership team.        Contacted patient via telephone call. All no's to Covid 19 questionaire. Does  not have a mask.  KB

## 2018-10-24 ENCOUNTER — Other Ambulatory Visit: Payer: Medicare Other | Admitting: *Deleted

## 2018-10-24 DIAGNOSIS — I519 Heart disease, unspecified: Secondary | ICD-10-CM

## 2018-10-24 DIAGNOSIS — I10 Essential (primary) hypertension: Secondary | ICD-10-CM

## 2018-10-24 DIAGNOSIS — Z79899 Other long term (current) drug therapy: Secondary | ICD-10-CM | POA: Diagnosis not present

## 2018-10-24 LAB — BASIC METABOLIC PANEL
BUN/Creatinine Ratio: 9 — ABNORMAL LOW (ref 12–28)
BUN: 19 mg/dL (ref 8–27)
CO2: 18 mmol/L — ABNORMAL LOW (ref 20–29)
Calcium: 9.1 mg/dL (ref 8.7–10.3)
Chloride: 102 mmol/L (ref 96–106)
Creatinine, Ser: 2.07 mg/dL — ABNORMAL HIGH (ref 0.57–1.00)
GFR calc Af Amer: 24 mL/min/{1.73_m2} — ABNORMAL LOW (ref >59)
GFR calc non Af Amer: 21 mL/min/{1.73_m2} — ABNORMAL LOW (ref >59)
Glucose: 191 mg/dL — ABNORMAL HIGH (ref 65–99)
Potassium: 3.7 mmol/L (ref 3.5–5.2)
Sodium: 137 mmol/L (ref 134–144)

## 2018-10-24 LAB — HEPATIC FUNCTION PANEL
ALT: 14 IU/L (ref 0–32)
AST: 30 IU/L (ref 0–40)
Albumin: 3.9 g/dL (ref 3.6–4.6)
Alkaline Phosphatase: 114 IU/L (ref 39–117)
Bilirubin Total: 0.3 mg/dL (ref 0.0–1.2)
Bilirubin, Direct: 0.11 mg/dL (ref 0.00–0.40)
Total Protein: 6.4 g/dL (ref 6.0–8.5)

## 2018-10-24 LAB — LIPID PANEL
Chol/HDL Ratio: 6 ratio — ABNORMAL HIGH (ref 0.0–4.4)
Cholesterol, Total: 108 mg/dL (ref 100–199)
HDL: 18 mg/dL — ABNORMAL LOW (ref >39)
LDL Calculated: 30 mg/dL (ref 0–99)
Triglycerides: 299 mg/dL — ABNORMAL HIGH (ref 0–149)
VLDL Cholesterol Cal: 60 mg/dL — ABNORMAL HIGH (ref 5–40)

## 2018-10-24 LAB — TSH: TSH: 1.45 u[IU]/mL (ref 0.450–4.500)

## 2018-11-04 ENCOUNTER — Ambulatory Visit: Payer: Medicare Other | Admitting: Nurse Practitioner

## 2018-11-13 ENCOUNTER — Other Ambulatory Visit: Payer: Self-pay | Admitting: *Deleted

## 2018-11-13 ENCOUNTER — Telehealth: Payer: Self-pay | Admitting: Nurse Practitioner

## 2018-11-13 MED ORDER — PRAVASTATIN SODIUM 20 MG PO TABS: 20.0000 mg | ORAL_TABLET | Freq: Every day | ORAL | 11 refills | Status: DC

## 2018-11-13 MED ORDER — GLIPIZIDE ER 5 MG PO TB24: 5.0000 mg | ORAL_TABLET | Freq: Every day | ORAL | 11 refills | Status: DC

## 2018-11-13 MED ORDER — POTASSIUM CHLORIDE ER 10 MEQ PO TBCR: 10.0000 meq | EXTENDED_RELEASE_TABLET | ORAL | 11 refills | Status: DC

## 2018-11-13 MED ORDER — HYDRALAZINE HCL 50 MG PO TABS: 50.0000 mg | ORAL_TABLET | Freq: Two times a day (BID) | ORAL | 11 refills | Status: DC

## 2018-11-13 NOTE — Telephone Encounter (Signed)
New Message  Patient's daughter calling in to go over medication and the refills for it. States that at the pharmacy the doctor that approved the medicine is the old doctor that patient was seeing before transferring. Wants to confirm whether they are the correct medications. Please give [name] (patient's daughter) a call.

## 2018-11-13 NOTE — Telephone Encounter (Signed)
Calling pt's daughter back per (DPR) pt needs hydrazaline,kdur,pravastatin, glipizide.  Will send to [facility] to see if [facility] wants to fill glipizide.  Looks like it hasnt been filled since 2017.  Pt's daughter stated PCP called and pt stated " I get all my medications from [facility]".  Pt is inactive at PCP.

## 2018-12-12 ENCOUNTER — Other Ambulatory Visit: Payer: Self-pay | Admitting: Nurse Practitioner

## 2018-12-30 ENCOUNTER — Telehealth: Payer: Self-pay | Admitting: *Deleted

## 2018-12-30 NOTE — Telephone Encounter (Signed)
S/w pt's daughter per (DPR), Leslie Coleman received documentation from:  [facility] [address] [place], [place] [address]  Needed Leslie Coleman to sign paperwork, cannot fill out needs to go to PCP for a knee brace, suspension sleeve and a condylar pad.  Pt's daughter stated this did not need to be filled out.

## 2019-02-06 ENCOUNTER — Other Ambulatory Visit: Payer: Self-pay | Admitting: Nurse Practitioner

## 2019-02-21 NOTE — Progress Notes (Signed)
CARDIOLOGY OFFICE NOTE  Date:  02/24/2019    Leslie Coleman Date of Birth: 03/11/1929 Medical Record [id number]  PCP:  NONE  Cardiologist:  Servando Snare & Allred   Chief Complaint  Patient presents with  . Follow-up    History of Present Illness: Leslie Coleman is a 85 y.o. female who presents today for a 4 month check. Seen for Dr. Rayann Heman - former patient of Dr. Susa Simmonds.She primarily follows with me.  She has a remote history of VT - treated initially with procainamide but switched to amiodarone back in 2008 due to unavailability of the medicine. Does have chronically abnormal PFTs. Other issues include HTN, gout, obesity, DM and anemia. On chronic Procrit.  I have followed her thru the years. She has gotten progressively feeble. More falls. She has had a broken leg due to a fall and required SNF stay. More sedentary. No longer on beta blocker due to bradycardia. She has had issues with swelling as well as dehydration. Medicines have always been a little difficult to discern at times.Last seen in the office back in November of 2019 -  felt to be ok but clearly getting more salt/fast food - she had had another fall. Fortunately, cardiac status felt to be ok. We did a telehealth visit in June - seemed stable.   The patient does not have symptoms concerning for COVID-19 infection (fever, chills, cough, or new shortness of breath).   Comes in today. Here with her daughter in law today. She is in a wheelchair. She is pretty sedentary. Still drinking massive amounts of soda. Taking Aleve every day too. Her husband - 76 - has stopped driving - they sold his car. Their family helps with meals - probably too much salt. She is sitting watching TV. Has more swelling - more so in the left leg. She has not had a Procrit shot since February. Tells me she fired her PCP - thinks that I am her PCP - informed that I cannot be.   Past Medical History:  Diagnosis Date  . Abnormal PFTs  (pulmonary function tests) 2008   No obstruction noted. Has been followed clinically.  . Anemia    treated with Procrit  . Fall from slip, trip, or stumble 05/07/2016   [place] resulting in right tibial plateau fracture  . Gout   . High risk medication use    on amiodarone.  Marland Kitchen HTN (hypertension)    Last echo in 2004; EF normal  . Hyperlipidemia   . Hypothyroidism    on replacement  . Irregular heart beat "since 1985"  . Obesity   . Type II diabetes mellitus ([place])   . V-tach [facility])    remote VT, treated initially with procainamide, on amiodarone since 2008    Past Surgical History:  Procedure Laterality Date  . CARDIOVASCULAR STRESS TEST  07/07/2003   EF 70%. NO EVIDENCE OF ISCHEMIA  . CARDIOVERSION  1978  . CATARACT EXTRACTION, BILATERAL Bilateral   . CATARACT EXTRACTION, BILATERAL Bilateral   . CESAREAN SECTION  1957; 1961; 1967  . FRACTURE SURGERY    . ORIF TIBIA PLATEAU Right 05/10/2016   Procedure: OPEN REDUCTION INTERNAL FIXATION (ORIF) RIGHT TIBIAL PLATEAU  WITH BONE GRAFT;  Surgeon: Leandrew Koyanagi, MD;  Location: [facility];  Service: Orthopedics;  Laterality: Right;  . US ECHOCARDIOGRAPHY  12/29/2002   EF 60-65%  . VAGINAL HYSTERECTOMY       Medications: Current Meds  Medication Sig  .  amiodarone (PACERONE) 100 MG tablet Take 1 tablet (100 mg total) by mouth every morning.  Marland Kitchen amLODipine (NORVASC) 5 MG tablet Take 1 tablet (5 mg total) by mouth daily.  Marland Kitchen aspirin 81 MG EC tablet Take 1 tablet (81 mg total) by mouth daily.  Marland Kitchen Epoetin Alfa (PROCRIT IJ) Inject 10,000 Units as directed every 28 (twenty-eight) days.   Marland Kitchen glipiZIDE (GLUCOTROL XL) 5 MG 24 hr tablet Take 1 tablet (5 mg total) by mouth daily.  . hydrALAZINE (APRESOLINE) 50 MG tablet Take 1 tablet (50 mg total) by mouth 2 (two) times daily.  Marland Kitchen levothyroxine (SYNTHROID) 100 MCG tablet Take 1 tablet (100 mcg total) by mouth daily before breakfast.  . Melatonin 5 MG CAPS Take 5 capsules by mouth at bedtime.  .  Multiple Vitamin (MULTIVITAMIN WITH MINERALS) TABS tablet Take 1 tablet by mouth daily.  Marland Kitchen omega-3 acid ethyl esters (LOVAZA) 1 g capsule Take 2 capsules by mouth twice daily.  . potassium chloride (K-DUR) 10 MEQ tablet Take 1 tablet (10 mEq total) by mouth every morning.  . pravastatin (PRAVACHOL) 20 MG tablet Take 1 tablet (20 mg total) by mouth at bedtime.  . [DISCONTINUED] furosemide (LASIX) 20 MG tablet Take 1 tablet by mouth daily.     Allergies: No Known Allergies  Social History: The patient  reports that she has never smoked. She has never used smokeless tobacco. She reports that she does not drink alcohol or use drugs.   Family History: The patient's family history includes Heart disease in her father and mother.   Review of Systems: Please see the history of present illness.   All other systems are reviewed and negative.   Physical Exam: VS:  BP 130/72   Pulse 85   Ht 5\' 2"  (1.575 m)   Wt 158 lb 12.8 oz (72 kg)   SpO2 98%   BMI 29.04 kg/m  .  BMI Body mass index is 29.04 kg/m.  Wt Readings from Last 3 Encounters:  02/24/19 158 lb 12.8 oz (72 kg)  10/22/18 156 lb (70.8 kg)  03/26/18 159 lb 1.9 oz (72.2 kg)    General: Elderly. Alert and in no acute distress.   HEENT: Normal.  Neck: Supple, no JVD, carotid bruits, or masses noted.  Cardiac: Regular rate and rhythm. Soft outflow murmur. 2+ edema - left worse than right.   Respiratory:  Lungs are clear to auscultation bilaterally with normal work of breathing.  GI: Soft and nontender.  MS: No deformity or atrophy. Gait not tested.  Skin: Warm and dry. Color is normal.  Neuro:  Strength and sensation are intact and no gross focal deficits noted.  Psych: Alert, appropriate and with normal affect.   LABORATORY DATA:  EKG:  EKG is ordered today. This demonstrates NSR - HR is 85.  Lab Results  Component Value Date   WBC 6.3 12/25/2017   HGB 10.5 (L) 07/22/2018   HCT 33.0 (L) 12/25/2017   PLT 178 12/25/2017    GLUCOSE 191 (H) 10/24/2018   CHOL 108 10/24/2018   TRIG 299 (H) 10/24/2018   HDL 18 (L) 10/24/2018   LDLCALC 30 10/24/2018   ALT 14 10/24/2018   AST 30 10/24/2018   NA 137 10/24/2018   K 3.7 10/24/2018   CL 102 10/24/2018   CREATININE 2.07 (H) 10/24/2018   BUN 19 10/24/2018   CO2 18 (L) 10/24/2018   TSH 1.450 10/24/2018   INR 1.11 06/18/2014   HGBA1C 6.0 (H) 10/19/2016  BNP (last 3 results) No results for input(s): BNP in the last 8760 hours.  ProBNP (last 3 results) No results for input(s): PROBNP in the last 8760 hours.   Other Studies Reviewed Today:  Echo Study Conclusions from September 2015  - Left ventricle: The cavity size was normal. Wall thickness was normal. Systolic function was normal. The estimated ejection fraction was in the range of 60% to 65%. Left ventricular diastolic function parameters were normal. - Mitral valve: There was mild regurgitation. - Left atrium: The atrium was mildly dilated. - Right atrium: The atrium was mildly dilated.   ASSESSMENT & PLAN:    1.Swelling - probably multifactorial - off Procrit - and always getting too much salt - increasing her Lasix today - will add 20 mg to go with her current 20 mg to = 40 mg today. Lab today. Advised to stop NSAID use.   2. Anemia - has not had Procrit in 8 months - no longer with PCP - recheck her lab today - this will most likely need to be restarted.   3. History of VT - on low dose amiodarone - no Atenolol due to prior bradycardia - EKG is ok today. Needs labs  4. HLD - on low dose statin - could consider stopping  5. Health maintenance - flu shot - high dose - to be given today.   6. HTN - BP is ok here today.   7. Mild LV dysfunction - last echo with normal EF - would manage conservatively. She is not going to restrict her salt use. Increasing her Lasix today.   8. History of abnormal PFTs - followed clinically. She says she is not short of breath - most likely too  sedentary to do activity that would cause dyspnea.   9. COVID-19 Education: The signs and symptoms of COVID-19 were discussed with the patient and how to seek care for testing (follow up with PCP or arrange E-visit).  The importance of social distancing, staying at home, hand hygiene and wearing a mask when out in public were discussed today.  Current medicines are reviewed with the patient today.  The patient does not have concerns regarding medicines other than what has been noted above.  The following changes have been made:  See above.  Labs/ tests ordered today include:    Orders Placed This Encounter  Procedures  . Basic metabolic panel  . CBC  . Hepatic function panel  . Lipid panel  . TSH  . EKG 12-Lead     Disposition:   FU with me in 4 months. List given to try and arrange PCP. Lab here today.    Patient is agreeable to this plan and will call if any problems develop in the interim.   SignedTruitt Merle, NP  02/24/2019 10:00 AM  [facility] [address] [place], [place]  [address] Phone: [phone] Fax: [phone]

## 2019-02-24 ENCOUNTER — Other Ambulatory Visit: Payer: Self-pay

## 2019-02-24 ENCOUNTER — Encounter: Payer: Self-pay | Admitting: Nurse Practitioner

## 2019-02-24 ENCOUNTER — Ambulatory Visit (INDEPENDENT_AMBULATORY_CARE_PROVIDER_SITE_OTHER): Payer: Medicare Other | Admitting: Nurse Practitioner

## 2019-02-24 VITALS — BP 130/72 | HR 85 | Ht 62.0 in | Wt 158.8 lb

## 2019-02-24 DIAGNOSIS — Z7189 Other specified counseling: Secondary | ICD-10-CM | POA: Diagnosis not present

## 2019-02-24 DIAGNOSIS — Z79899 Other long term (current) drug therapy: Secondary | ICD-10-CM | POA: Diagnosis not present

## 2019-02-24 DIAGNOSIS — E7800 Pure hypercholesterolemia, unspecified: Secondary | ICD-10-CM | POA: Diagnosis not present

## 2019-02-24 DIAGNOSIS — R001 Bradycardia, unspecified: Secondary | ICD-10-CM

## 2019-02-24 DIAGNOSIS — I472 Ventricular tachycardia, unspecified: Secondary | ICD-10-CM

## 2019-02-24 DIAGNOSIS — Z23 Encounter for immunization: Secondary | ICD-10-CM | POA: Diagnosis not present

## 2019-02-24 DIAGNOSIS — I10 Essential (primary) hypertension: Secondary | ICD-10-CM | POA: Diagnosis not present

## 2019-02-24 DIAGNOSIS — I519 Heart disease, unspecified: Secondary | ICD-10-CM | POA: Diagnosis not present

## 2019-02-24 LAB — CBC
Hematocrit: 29.7 % — ABNORMAL LOW (ref 34.0–46.6)
Hemoglobin: 9.4 g/dL — ABNORMAL LOW (ref 11.1–15.9)
MCH: 28.5 pg (ref 26.6–33.0)
MCHC: 31.6 g/dL (ref 31.5–35.7)
MCV: 90 fL (ref 79–97)
Platelets: 108 10*3/uL — ABNORMAL LOW (ref 150–450)
RBC: 3.3 x10E6/uL — ABNORMAL LOW (ref 3.77–5.28)
RDW: 14.6 % (ref 11.7–15.4)
WBC: 6.6 10*3/uL (ref 3.4–10.8)

## 2019-02-24 LAB — BASIC METABOLIC PANEL
BUN/Creatinine Ratio: 10 — ABNORMAL LOW (ref 12–28)
BUN: 21 mg/dL (ref 8–27)
CO2: 21 mmol/L (ref 20–29)
Calcium: 8.8 mg/dL (ref 8.7–10.3)
Chloride: 101 mmol/L (ref 96–106)
Creatinine, Ser: 2.13 mg/dL — ABNORMAL HIGH (ref 0.57–1.00)
GFR calc Af Amer: 23 mL/min/{1.73_m2} — ABNORMAL LOW (ref >59)
GFR calc non Af Amer: 20 mL/min/{1.73_m2} — ABNORMAL LOW (ref >59)
Glucose: 244 mg/dL — ABNORMAL HIGH (ref 65–99)
Potassium: 4 mmol/L (ref 3.5–5.2)
Sodium: 136 mmol/L (ref 134–144)

## 2019-02-24 LAB — HEPATIC FUNCTION PANEL
ALT: 13 IU/L (ref 0–32)
AST: 25 IU/L (ref 0–40)
Albumin: 3.8 g/dL (ref 3.6–4.6)
Alkaline Phosphatase: 155 IU/L — ABNORMAL HIGH (ref 39–117)
Bilirubin Total: 0.3 mg/dL (ref 0.0–1.2)
Bilirubin, Direct: 0.12 mg/dL (ref 0.00–0.40)
Total Protein: 6.6 g/dL (ref 6.0–8.5)

## 2019-02-24 LAB — LIPID PANEL
Chol/HDL Ratio: 5.4 ratio — ABNORMAL HIGH (ref 0.0–4.4)
Cholesterol, Total: 108 mg/dL (ref 100–199)
HDL: 20 mg/dL — ABNORMAL LOW (ref >39)
LDL Chol Calc (NIH): 44 mg/dL (ref 0–99)
Triglycerides: 284 mg/dL — ABNORMAL HIGH (ref 0–149)
VLDL Cholesterol Cal: 44 mg/dL — ABNORMAL HIGH (ref 5–40)

## 2019-02-24 LAB — TSH: TSH: 2.45 u[IU]/mL (ref 0.450–4.500)

## 2019-02-24 MED ORDER — FUROSEMIDE 40 MG PO TABS: 40.0000 mg | ORAL_TABLET | Freq: Every day | ORAL | 3 refills | Status: DC

## 2019-02-24 MED ORDER — FUROSEMIDE 20 MG PO TABS: 20.0000 mg | ORAL_TABLET | Freq: Every day | ORAL | 0 refills | Status: DC

## 2019-02-24 NOTE — Patient Instructions (Addendum)
After Visit Summary:  We will be checking the following labs today - BMET, CBC, HPF, Lipids and TSH   Medication Instructions:    Continue with your current medicines. BUT   I am giving you a one month supply of Lasix 20 mg (sent to [facility]) to add to your current Lasix - total of 40 mg a day. A new RX for the 40 mg was sent to [facility]  Flu shot today  No more Aleve   If you need a refill on your cardiac medications before your next appointment, please call your pharmacy.     Testing/Procedures To Be Arranged:  N/A  Follow-Up:   See me in about 4 months with EKG  We will give you a list of doctors to try and arrange primary care    At [facility], you and your health needs are our priority.  As part of our continuing mission to provide you with exceptional heart care, we have created designated Provider Care Teams.  These Care Teams include your primary Cardiologist (physician) and Advanced Practice Providers (APPs -  Physician Assistants and Nurse Practitioners) who all work together to provide you with the care you need, when you need it.  Special Instructions:  . Stay safe, stay home, wash your hands for at least 20 seconds and wear a mask when out in public.  . It was good to talk with you today.    Call the [place] office at [phone] if you have any questions, problems or concerns.

## 2019-03-03 ENCOUNTER — Ambulatory Visit (HOSPITAL_COMMUNITY)
Admission: RE | Admit: 2019-03-03 | Discharge: 2019-03-03 | Disposition: A | Discharge status: Home / Self Care | Payer: Medicare Other | Source: Ambulatory Visit | Attending: Internal Medicine | Admitting: Internal Medicine

## 2019-03-03 ENCOUNTER — Other Ambulatory Visit: Payer: Self-pay

## 2019-03-03 VITALS — BP 141/72 | HR 89 | Temp 98.4°F | Resp 20

## 2019-03-03 DIAGNOSIS — N189 Chronic kidney disease, unspecified: Secondary | ICD-10-CM | POA: Diagnosis not present

## 2019-03-03 DIAGNOSIS — D631 Anemia in chronic kidney disease: Secondary | ICD-10-CM | POA: Insufficient documentation

## 2019-03-03 LAB — POCT HEMOGLOBIN-HEMACUE: Hemoglobin: 9.4 g/dL — ABNORMAL LOW (ref 12.0–15.0)

## 2019-03-03 MED ORDER — EPOETIN ALFA 10000 UNIT/ML IJ SOLN: 30000.0000 [IU] | INTRAMUSCULAR | Status: DC

## 2019-03-03 MED ORDER — EPOETIN ALFA 10000 UNIT/ML IJ SOLN
INTRAMUSCULAR | Status: AC
  Administered 2019-03-03: 10000 [IU]
  Filled 2019-03-03: qty 1

## 2019-03-03 MED ORDER — EPOETIN ALFA 20000 UNIT/ML IJ SOLN
INTRAMUSCULAR | Status: AC
  Administered 2019-03-03: 20000 [IU]
  Filled 2019-03-03: qty 1

## 2019-03-10 ENCOUNTER — Encounter: Payer: Self-pay | Admitting: *Deleted

## 2019-03-12 ENCOUNTER — Telehealth: Payer: Self-pay | Admitting: Nurse Practitioner

## 2019-03-12 ENCOUNTER — Other Ambulatory Visit: Payer: Self-pay | Admitting: Nurse Practitioner

## 2019-03-12 ENCOUNTER — Telehealth: Payer: Self-pay | Admitting: *Deleted

## 2019-03-12 NOTE — Telephone Encounter (Signed)
No message needed °

## 2019-03-12 NOTE — Telephone Encounter (Signed)
Noted.   Leslie Coleman 

## 2019-03-12 NOTE — Telephone Encounter (Signed)
Pt calling in today due to pt getting fraudulent calls.  Social Engineer, technical sales.  Pt stated someone called to let pt know medicare card has expired.  Stated no on would be calling you from any of these offices including IRS. Told pt to stop answering phone.   Stated has pt told her daughter?? Pt stated no that is why she called [place],  her daughter is busy.  Pt wanted Cecille Rubin to know. Will send to [place] to [place].

## 2019-03-18 ENCOUNTER — Other Ambulatory Visit: Payer: Self-pay | Admitting: Nurse Practitioner

## 2019-03-31 ENCOUNTER — Encounter (HOSPITAL_COMMUNITY): Payer: Medicare Other

## 2019-04-07 ENCOUNTER — Ambulatory Visit (HOSPITAL_COMMUNITY)
Admission: RE | Admit: 2019-04-07 | Discharge: 2019-04-07 | Disposition: A | Discharge status: Home / Self Care | Payer: Medicare Other | Source: Ambulatory Visit | Attending: Internal Medicine | Admitting: Internal Medicine

## 2019-04-07 ENCOUNTER — Other Ambulatory Visit: Payer: Self-pay

## 2019-04-07 VITALS — BP 146/75 | HR 91 | Temp 94.7°F | Resp 20

## 2019-04-07 DIAGNOSIS — D631 Anemia in chronic kidney disease: Secondary | ICD-10-CM | POA: Diagnosis not present

## 2019-04-07 DIAGNOSIS — N189 Chronic kidney disease, unspecified: Secondary | ICD-10-CM | POA: Diagnosis not present

## 2019-04-07 LAB — POCT HEMOGLOBIN-HEMACUE: Hemoglobin: 9.9 g/dL — ABNORMAL LOW (ref 12.0–15.0)

## 2019-04-07 MED ORDER — EPOETIN ALFA 10000 UNIT/ML IJ SOLN
INTRAMUSCULAR | Status: AC
  Filled 2019-04-07: qty 1

## 2019-04-07 MED ORDER — EPOETIN ALFA 20000 UNIT/ML IJ SOLN
INTRAMUSCULAR | Status: AC
  Administered 2019-04-07: 09:00:00 30000 [IU]
  Filled 2019-04-07: qty 1

## 2019-04-07 MED ORDER — EPOETIN ALFA 10000 UNIT/ML IJ SOLN: 30000.0000 [IU] | INTRAMUSCULAR | Status: DC

## 2019-04-08 MED FILL — Epoetin Alfa Inj 10000 Unit/ML: INTRAMUSCULAR | Qty: 1 | Status: AC

## 2019-04-08 MED FILL — Epoetin Alfa Inj 20000 Unit/ML: INTRAMUSCULAR | Qty: 1 | Status: AC

## 2019-04-28 ENCOUNTER — Encounter (HOSPITAL_COMMUNITY): Payer: Medicare Other

## 2019-05-06 ENCOUNTER — Other Ambulatory Visit: Payer: Self-pay | Admitting: Nurse Practitioner

## 2019-05-12 ENCOUNTER — Encounter (HOSPITAL_COMMUNITY): Payer: Medicare Other

## 2019-06-05 ENCOUNTER — Other Ambulatory Visit: Payer: Self-pay | Admitting: Nurse Practitioner

## 2019-06-09 ENCOUNTER — Encounter (HOSPITAL_COMMUNITY): Payer: Medicare Other

## 2019-06-18 NOTE — Progress Notes (Deleted)
CARDIOLOGY OFFICE NOTE  Date:  06/18/2019    Leslie Coleman Date of Birth: [birth year] Medical Record [id number]  PCP:  Burtis Junes, NP  Cardiologist:  Servando Snare & ***    No chief complaint on file.   History of Present Illness: Leslie Coleman is a 86 y.o. female who presents today for a ***  The patient {does/does not:200015} have symptoms concerning for COVID-19 infection (fever, chills, cough, or new shortness of breath).   Comes in today. Here with   Past Medical History:  Diagnosis Date  . Abnormal PFTs (pulmonary function tests) 2008   No obstruction noted. Has been followed clinically.  . Anemia    treated with Procrit  . Fall from slip, trip, or stumble 05/07/2016   [place] resulting in right tibial plateau fracture  . Gout   . High risk medication use    on amiodarone.  Marland Kitchen HTN (hypertension)    Last echo in 2004; EF normal  . Hyperlipidemia   . Hypothyroidism    on replacement  . Irregular heart beat "since 1985"  . Obesity   . Type II diabetes mellitus ([place])   . V-tach [facility])    remote VT, treated initially with procainamide, on amiodarone since 2008    Past Surgical History:  Procedure Laterality Date  . CARDIOVASCULAR STRESS TEST  07/07/2003   EF 70%. NO EVIDENCE OF ISCHEMIA  . CARDIOVERSION  1978  . CATARACT EXTRACTION, BILATERAL Bilateral   . CATARACT EXTRACTION, BILATERAL Bilateral   . CESAREAN SECTION  1957; 1961; 1967  . FRACTURE SURGERY    . ORIF TIBIA PLATEAU Right 05/10/2016   Procedure: OPEN REDUCTION INTERNAL FIXATION (ORIF) RIGHT TIBIAL PLATEAU  WITH BONE GRAFT;  Surgeon: Leandrew Koyanagi, MD;  Location: [facility];  Service: Orthopedics;  Laterality: Right;  . US ECHOCARDIOGRAPHY  12/29/2002   EF 60-65%  . VAGINAL HYSTERECTOMY       Medications: No outpatient medications have been marked as taking for the 06/24/19 encounter (Appointment) with Burtis Junes, NP.     Allergies: No Known Allergies  Social History: The  patient  reports that she has never smoked. She has never used smokeless tobacco. She reports that she does not drink alcohol or use drugs.   Family History: The patient's ***family history includes Heart disease in her father and mother.   Review of Systems: Please see the history of present illness.   All other systems are reviewed and negative.   Physical Exam: VS:  There were no vitals taken for this visit. Marland Kitchen  BMI There is no height or weight on file to calculate BMI.  Wt Readings from Last 3 Encounters:  02/24/19 158 lb 12.8 oz (72 kg)  10/22/18 156 lb (70.8 kg)  03/26/18 159 lb 1.9 oz (72.2 kg)    General: Pleasant. Well developed, well nourished and in no acute distress.   HEENT: Normal.  Neck: Supple, no JVD, carotid bruits, or masses noted.  Cardiac: ***Regular rate and rhythm. No murmurs, rubs, or gallops. No edema.  Respiratory:  Lungs are clear to auscultation bilaterally with normal work of breathing.  GI: Soft and nontender.  MS: No deformity or atrophy. Gait and ROM intact.  Skin: Warm and dry. Color is normal.  Neuro:  Strength and sensation are intact and no gross focal deficits noted.  Psych: Alert, appropriate and with normal affect.   LABORATORY DATA:  EKG:  EKG {ACTION; IS/IS [id number] ordered today.  This demonstrates ***.  Lab Results  Component Value Date   WBC 6.6 02/24/2019   HGB 9.9 (L) 04/07/2019   HCT 29.7 (L) 02/24/2019   PLT 108 (L) 02/24/2019   GLUCOSE 244 (H) 02/24/2019   CHOL 108 02/24/2019   TRIG 284 (H) 02/24/2019   HDL 20 (L) 02/24/2019   LDLCALC 44 02/24/2019   ALT 13 02/24/2019   AST 25 02/24/2019   NA 136 02/24/2019   K 4.0 02/24/2019   CL 101 02/24/2019   CREATININE 2.13 (H) 02/24/2019   BUN 21 02/24/2019   CO2 21 02/24/2019   TSH 2.450 02/24/2019   INR 1.11 06/18/2014   HGBA1C 6.0 (H) 10/19/2016     BNP (last 3 results) No results for input(s): BNP in the last 8760 hours.  ProBNP (last 3 results) No results for  input(s): PROBNP in the last 8760 hours.   Other Studies Reviewed Today:   Assessment/Plan:  . COVID-19 Education: The signs and symptoms of COVID-19 were discussed with the patient and how to seek care for testing (follow up with PCP or arrange E-visit).  The importance of social distancing, staying at home, hand hygiene and wearing a mask when out in public were discussed today.  Current medicines are reviewed with the patient today.  The patient does not have concerns regarding medicines other than what has been noted above.  The following changes have been made:  See above.  Labs/ tests ordered today include:   No orders of the defined types were placed in this encounter.    Disposition:   FU with *** in {gen number [id number] {Days to years:10300}.   Patient is agreeable to this plan and will call if any problems develop in the interim.   SignedTruitt Merle, NP  06/18/2019 8:24 AM  [facility] [address] [place], [place]  [address] Phone: [phone] Fax: [phone]

## 2019-06-24 ENCOUNTER — Ambulatory Visit: Payer: Medicare Other | Admitting: Nurse Practitioner

## 2019-06-30 ENCOUNTER — Ambulatory Visit: Payer: Medicare Other | Admitting: Nurse Practitioner

## 2019-07-03 NOTE — Progress Notes (Signed)
CARDIOLOGY OFFICE NOTE  Date:  07/07/2019    Leslie Coleman Date of Birth: January 24, 1929 Medical Record [id number]  PCP:   Cardiologist:  Leslie Coleman   Chief Complaint  Patient presents with  . Follow-up    Seen for Dr. Rayann Coleman    History of Present Illness: Leslie Coleman is a 86 y.o. female who presents today for a 4 month check. Seen for Dr. Rayann Coleman - former patient of Dr. Susa Coleman.She primarily follows with me.  She has a remote history of VT - treated initially with procainamide but switched to amiodarone back in 2008 due to unavailability of the medicine. Does have chronically abnormal PFTs. Other issues include HTN, gout, obesity, DM and anemia. On chronic Procrit.  I have followed her thru the years. She has gotten progressively feeble. More falls. She has had a broken leg due to a fall and required SNF stay. More sedentary. No longer on beta blocker due to bradycardia. She has had issues with swelling as well as dehydration. Medicines have always been a little difficult to discern at times.Last seen in the office back in October - very sedentary - continuing to drink massive quantities of soda - not intention of stopping. Also on Aleve despite advising not to take. She was off her Procrit - had not seen PCP - thought I was going to do her primary care. Very sad situation.   The patient does not have symptoms concerning for COVID-19 infection (fever, chills, cough, or new shortness of breath).   Comes in today. Here with her son in law. Hearing is worse. She has no PCP still. No recent falls. No chest pain. Breathing seems ok. Chronic swelling - worse in the left leg - seems unchanged. Not interested in the COVID vaccine. Family members have had COVID about 2 months ago. Not dizzy.   Past Medical History:  Diagnosis Date  . Abnormal PFTs (pulmonary function tests) 2008   No obstruction noted. Has been followed clinically.  . Anemia    treated with Procrit  .  Fall from slip, trip, or stumble 05/07/2016   [place] resulting in right tibial plateau fracture  . Gout   . High risk medication use    on amiodarone.  Marland Kitchen HTN (hypertension)    Last echo in 2004; EF normal  . Hyperlipidemia   . Hypothyroidism    on replacement  . Irregular heart beat "since 1985"  . Obesity   . Type II diabetes mellitus ([place])   . V-tach [facility])    remote VT, treated initially with procainamide, on amiodarone since 2008    Past Surgical History:  Procedure Laterality Date  . CARDIOVASCULAR STRESS TEST  07/07/2003   EF 70%. NO EVIDENCE OF ISCHEMIA  . CARDIOVERSION  1978  . CATARACT EXTRACTION, BILATERAL Bilateral   . CATARACT EXTRACTION, BILATERAL Bilateral   . CESAREAN SECTION  1957; 1961; 1967  . FRACTURE SURGERY    . ORIF TIBIA PLATEAU Right 05/10/2016   Procedure: OPEN REDUCTION INTERNAL FIXATION (ORIF) RIGHT TIBIAL PLATEAU  WITH BONE GRAFT;  Surgeon: Leslie Koyanagi, MD;  Location: [facility];  Service: Orthopedics;  Laterality: Right;  . US ECHOCARDIOGRAPHY  12/29/2002   EF 60-65%  . VAGINAL HYSTERECTOMY       Medications: Current Meds  Medication Sig  . amiodarone (PACERONE) 100 MG tablet Take 1 tablet by mouth every morning.  Marland Kitchen amLODipine (NORVASC) 5 MG tablet Take 1 tablet by mouth daily.  Marland Kitchen  ASPIRIN ADULT LOW STRENGTH 81 MG EC tablet Take 1 tablet by mouth daily.  Marland Kitchen Epoetin Alfa (PROCRIT IJ) Inject 10,000 Units as directed every 28 (twenty-eight) days.   Marland Kitchen glipiZIDE (GLUCOTROL XL) 5 MG 24 hr tablet Take 1 tablet (5 mg total) by mouth daily.  . hydrALAZINE (APRESOLINE) 50 MG tablet Take 1 tablet (50 mg total) by mouth 2 (two) times daily.  Marland Kitchen levothyroxine (SYNTHROID) 100 MCG tablet Take 1 tablet by mouth daily before breakfast.  . Melatonin 5 MG CAPS Take 5 capsules by mouth at bedtime.  . Multiple Vitamin (MULTIVITAMIN WITH MINERALS) TABS tablet Take 1 tablet by mouth daily.  Marland Kitchen omega-3 acid ethyl esters (LOVAZA) 1 g capsule Take 2 capsules by mouth  twice daily.  . potassium chloride (K-DUR) 10 MEQ tablet Take 1 tablet (10 mEq total) by mouth every morning.  . pravastatin (PRAVACHOL) 20 MG tablet Take 1 tablet (20 mg total) by mouth at bedtime.  . [DISCONTINUED] furosemide (LASIX) 20 MG tablet TAKE 1 TABLET BY MOUTH EVERY DAY     Allergies: No Known Allergies  Social History: The patient  reports that she has never smoked. She has never used smokeless tobacco. She reports that she does not drink alcohol or use drugs.   Family History: The patient's family history includes Heart disease in her father and mother.   Review of Systems: Please see the history of present illness.   All other systems are reviewed and negative.   Physical Exam: VS:  BP 114/62   Pulse 88   Ht 5\' 2"  (1.575 m)   Wt 150 lb (68 kg)   SpO2 98%   BMI 27.44 kg/m  .  BMI Body mass index is 27.44 kg/m.  Wt Readings from Last 3 Encounters:  07/07/19 150 lb (68 kg)  02/24/19 158 lb 12.8 oz (72 kg)  10/22/18 156 lb (70.8 kg)    General: Pleasant. Elderly. She is in no acute distress.  Very hard of hearing.  HEENT: Normal.  Neck: Supple, no JVD, carotid bruits, or masses noted.  Cardiac: Regular rate and rhythm. Outflow murmur noted. She has 1+ edema on the left - less on the right.   Respiratory:  Lungs are clear to auscultation bilaterally with normal work of breathing.  MS: No deformity or atrophy. Gait not tested.  Skin: Warm and dry. Color is normal.  Neuro:  Strength and sensation are intact and no gross focal deficits noted.  Psych: Alert, appropriate and with normal affect.   LABORATORY DATA:  EKG:  EKG is ordered today. This demonstrates NSR with 1st degree AV block.  Lab Results  Component Value Date   WBC 6.6 02/24/2019   HGB 9.9 (L) 04/07/2019   HCT 29.7 (L) 02/24/2019   PLT 108 (L) 02/24/2019   GLUCOSE 244 (H) 02/24/2019   CHOL 108 02/24/2019   TRIG 284 (H) 02/24/2019   HDL 20 (L) 02/24/2019   LDLCALC 44 02/24/2019   ALT 13  02/24/2019   AST 25 02/24/2019   NA 136 02/24/2019   K 4.0 02/24/2019   CL 101 02/24/2019   CREATININE 2.13 (H) 02/24/2019   BUN 21 02/24/2019   CO2 21 02/24/2019   TSH 2.450 02/24/2019   INR 1.11 06/18/2014   HGBA1C 6.0 (H) 10/19/2016     BNP (last 3 results) No results for input(s): BNP in the last 8760 hours.  ProBNP (last 3 results) No results for input(s): PROBNP in the last 8760 hours.  Other Studies Reviewed Today:  Echo Study Conclusions from September 2015  - Left ventricle: The cavity size was normal. Wall thickness was normal. Systolic function was normal. The estimated ejection fraction was in the range of 60% to 65%. Left ventricular diastolic function parameters were normal. - Mitral valve: There was mild regurgitation. - Left atrium: The atrium was mildly dilated. - Right atrium: The atrium was mildly dilated.   ASSESSMENT & PLAN:   1.History of VT - on low dose amiodarone - EKG is ok. No longer on Atenolol due to prior bradycardia.   2. Swelling - most likely multifactorial - on Lasix 40 mg a day - needs labs today.   3. High risk medicine - lab today.   4. Anemia - still has not gotten her Procrit - has not been back to PCP - they are to try and arrange - if this does not pan out - will need referral to hematology.   5. HLD - on statin and Lovaza.   6. HTN - BP is ok.   7. Mild LV dysfunction - last echo with normal EF - managed conservatively. She is not going to stop her salt, etc. Recheck lab today. Weight is down.   8. History of abnormal PFTs - followed clinically. She says she is not short of breath - most likely too sedentary to do activity that would cause dyspnea. Would not repeat.   9. COVID-19 Education: The signs and symptoms of COVID-19 were discussed with the patient and how to seek care for testing (follow up with PCP or arrange E-visit).  The importance of social distancing, staying at home, hand hygiene and wearing a mask  when out in public were discussed today. Not interested in vaccine.   Current medicines are reviewed with the patient today.  The patient does not have concerns regarding medicines other than what has been noted above.  The following changes have been made:  See above.  Labs/ tests ordered today include:    Orders Placed This Encounter  Procedures  . Basic metabolic panel  . CBC  . Hepatic function panel  . Lipid panel  . TSH  . EKG 12-Lead     Disposition:   FU with me in 4 months. EKG on return. They need to try and arrange PCP follow up for her Procrit.    Patient is agreeable to this plan and will call if any problems develop in the interim.   SignedTruitt Merle, NP  07/07/2019 8:58 AM  [facility] [address] [place], [place]  [address] Phone: [phone] Fax: [phone]

## 2019-07-07 ENCOUNTER — Ambulatory Visit (INDEPENDENT_AMBULATORY_CARE_PROVIDER_SITE_OTHER): Payer: Medicare Other | Admitting: Nurse Practitioner

## 2019-07-07 ENCOUNTER — Ambulatory Visit: Payer: Medicare Other | Admitting: Nurse Practitioner

## 2019-07-07 ENCOUNTER — Encounter: Payer: Self-pay | Admitting: Nurse Practitioner

## 2019-07-07 ENCOUNTER — Other Ambulatory Visit: Payer: Self-pay

## 2019-07-07 VITALS — BP 114/62 | HR 88 | Ht 62.0 in | Wt 150.0 lb

## 2019-07-07 DIAGNOSIS — Z79899 Other long term (current) drug therapy: Secondary | ICD-10-CM | POA: Diagnosis not present

## 2019-07-07 DIAGNOSIS — I10 Essential (primary) hypertension: Secondary | ICD-10-CM | POA: Diagnosis not present

## 2019-07-07 DIAGNOSIS — I472 Ventricular tachycardia, unspecified: Secondary | ICD-10-CM

## 2019-07-07 DIAGNOSIS — E7800 Pure hypercholesterolemia, unspecified: Secondary | ICD-10-CM

## 2019-07-07 DIAGNOSIS — Z7189 Other specified counseling: Secondary | ICD-10-CM

## 2019-07-07 DIAGNOSIS — I519 Heart disease, unspecified: Secondary | ICD-10-CM

## 2019-07-07 DIAGNOSIS — R001 Bradycardia, unspecified: Secondary | ICD-10-CM

## 2019-07-07 NOTE — Patient Instructions (Addendum)
After Visit Summary:  We will be checking the following labs today - CBC, BMET, HPF, TSH and Lipids   Medication Instructions:    Continue with your current medicines.    If you need a refill on your cardiac medications before your next appointment, please call your pharmacy.     Testing/Procedures To Be Arranged:  N/A  Follow-Up:   See me in 4 months    At [facility], you and your health needs are our priority.  As part of our continuing mission to provide you with exceptional heart care, we have created designated Provider Care Teams.  These Care Teams include your primary Cardiologist (physician) and Advanced Practice Providers (APPs -  Physician Assistants and Nurse Practitioners) who all work together to provide you with the care you need, when you need it.  Special Instructions:  . Stay safe, stay home, wash your hands for at least 20 seconds and wear a mask when out in public.  . It was good to talk with you today.  . Call Dr. Rogelia Mire and see if he will see you again.  . Let me know if he does not, we will send to hematology.    Call the [place] office at [phone] if you have any questions, problems or concerns.

## 2019-07-08 LAB — BASIC METABOLIC PANEL
BUN/Creatinine Ratio: 10 — ABNORMAL LOW (ref 12–28)
BUN: 21 mg/dL (ref 8–27)
CO2: 19 mmol/L — ABNORMAL LOW (ref 20–29)
Calcium: 9 mg/dL (ref 8.7–10.3)
Chloride: 101 mmol/L (ref 96–106)
Creatinine, Ser: 2.16 mg/dL — ABNORMAL HIGH (ref 0.57–1.00)
GFR calc Af Amer: 23 mL/min/{1.73_m2} — ABNORMAL LOW (ref >59)
GFR calc non Af Amer: 20 mL/min/{1.73_m2} — ABNORMAL LOW (ref >59)
Glucose: 214 mg/dL — ABNORMAL HIGH (ref 65–99)
Potassium: 3.9 mmol/L (ref 3.5–5.2)
Sodium: 137 mmol/L (ref 134–144)

## 2019-07-08 LAB — LIPID PANEL
Chol/HDL Ratio: 4.7 ratio — ABNORMAL HIGH (ref 0.0–4.4)
Cholesterol, Total: 109 mg/dL (ref 100–199)
HDL: 23 mg/dL — ABNORMAL LOW (ref >39)
LDL Chol Calc (NIH): 49 mg/dL (ref 0–99)
Triglycerides: 228 mg/dL — ABNORMAL HIGH (ref 0–149)
VLDL Cholesterol Cal: 37 mg/dL (ref 5–40)

## 2019-07-08 LAB — CBC
Hematocrit: 28.5 % — ABNORMAL LOW (ref 34.0–46.6)
Hemoglobin: 9.4 g/dL — ABNORMAL LOW (ref 11.1–15.9)
MCH: 27.4 pg (ref 26.6–33.0)
MCHC: 33 g/dL (ref 31.5–35.7)
MCV: 83 fL (ref 79–97)
Platelets: 102 10*3/uL — ABNORMAL LOW (ref 150–450)
RBC: 3.43 x10E6/uL — ABNORMAL LOW (ref 3.77–5.28)
RDW: 15.4 % (ref 11.7–15.4)
WBC: 6.8 10*3/uL (ref 3.4–10.8)

## 2019-07-08 LAB — HEPATIC FUNCTION PANEL
ALT: 13 IU/L (ref 0–32)
AST: 24 IU/L (ref 0–40)
Albumin: 3.8 g/dL (ref 3.6–4.6)
Alkaline Phosphatase: 190 IU/L — ABNORMAL HIGH (ref 39–117)
Bilirubin Total: 0.3 mg/dL (ref 0.0–1.2)
Bilirubin, Direct: 0.12 mg/dL (ref 0.00–0.40)
Total Protein: 7 g/dL (ref 6.0–8.5)

## 2019-07-08 LAB — TSH: TSH: 1.73 u[IU]/mL (ref 0.450–4.500)

## 2019-07-14 DIAGNOSIS — N184 Chronic kidney disease, stage 4 (severe): Secondary | ICD-10-CM | POA: Diagnosis not present

## 2019-07-14 DIAGNOSIS — D649 Anemia, unspecified: Secondary | ICD-10-CM | POA: Diagnosis not present

## 2019-07-14 DIAGNOSIS — I1310 Hypertensive heart and chronic kidney disease without heart failure, with stage 1 through stage 4 chronic kidney disease, or unspecified chronic kidney disease: Secondary | ICD-10-CM | POA: Diagnosis not present

## 2019-07-14 DIAGNOSIS — E1121 Type 2 diabetes mellitus with diabetic nephropathy: Secondary | ICD-10-CM | POA: Diagnosis not present

## 2019-07-14 DIAGNOSIS — Z79899 Other long term (current) drug therapy: Secondary | ICD-10-CM | POA: Diagnosis not present

## 2019-07-14 DIAGNOSIS — I10 Essential (primary) hypertension: Secondary | ICD-10-CM | POA: Diagnosis not present

## 2019-07-28 DIAGNOSIS — I10 Essential (primary) hypertension: Secondary | ICD-10-CM | POA: Diagnosis not present

## 2019-07-28 DIAGNOSIS — D509 Iron deficiency anemia, unspecified: Secondary | ICD-10-CM | POA: Diagnosis not present

## 2019-07-28 DIAGNOSIS — E1121 Type 2 diabetes mellitus with diabetic nephropathy: Secondary | ICD-10-CM | POA: Diagnosis not present

## 2019-07-28 DIAGNOSIS — N184 Chronic kidney disease, stage 4 (severe): Secondary | ICD-10-CM | POA: Diagnosis not present

## 2019-07-28 DIAGNOSIS — I1310 Hypertensive heart and chronic kidney disease without heart failure, with stage 1 through stage 4 chronic kidney disease, or unspecified chronic kidney disease: Secondary | ICD-10-CM | POA: Diagnosis not present

## 2019-09-03 ENCOUNTER — Other Ambulatory Visit: Payer: Self-pay | Admitting: Nurse Practitioner

## 2019-09-30 ENCOUNTER — Telehealth: Payer: Self-pay | Admitting: Nurse Practitioner

## 2019-09-30 NOTE — Telephone Encounter (Signed)
Encouraged patient's daughter (DPR) to call patient's PCP about getting medications for anxiety. Patient's daughter verbalized understanding and will call the PCP.

## 2019-09-30 NOTE — Telephone Encounter (Signed)
Agree, would discuss with PCP.   I do not give these type of medicines.   Leslie Coleman

## 2019-09-30 NOTE — Telephone Encounter (Signed)
Patient's daughter is calling in regards to symptoms that the patient has been experiencing. She states the patient has been extremely anxious due to her husband's hip injury. [name] is requesting to speak with a nurse regarding whether or not the patient is eligible to begin taking medication for anxiety. Please call.

## 2019-10-03 ENCOUNTER — Other Ambulatory Visit: Payer: Self-pay | Admitting: Nurse Practitioner

## 2019-11-11 NOTE — Progress Notes (Signed)
CARDIOLOGY OFFICE NOTE  Date:  11/17/2019    Leslie Coleman Date of Birth: [birth year] Medical Record #[id number]  PCP:  [name], MD  Cardiologist:  Servando Snare Allred    Chief Complaint  Patient presents with  . Follow-up    History of Present Illness: Leslie Coleman is a 86 y.o. female who presents today for a 4 month check. Seen for Dr. Rayann Heman - former patient of Dr. Susa Simmonds.She primarily follows with me.  She has a remote history of VT - treated initially with procainamide but switched to amiodarone back in 2008 due to unavailability of the medicine. Does have chronically abnormal PFTs. Other issues include HTN, gout, obesity, DM and anemia. On chronic Procrit.  I have followed her thru the years. She has gotten progressively feeble. More falls. She has had a broken leg due to a fall and required SNF stay. More sedentary. No longer on beta blocker due to bradycardia. She has had issues with swelling as well as dehydration. Medicines have always been a little difficult to discern at times.Typically she will drink massive quantities of soda - no intention of stopping. Also on Aleve despite advising not to take. She was off her Procrit - had not seen PCP - thought I was going to do her primary care - tried to get her back to primary care. Very sad/challenging situation. Family is supportive.   Last seen in February - no interest in getting COVID vaccine.   The patient does not have symptoms concerning for COVID-19 infection (fever, chills, cough, or new shortness of breath).   Comes in today. Here with her son in law. Her husband who is 95 fell back in March - broke his hip. Her hearing is getting worse. Personality changing. Little more angry with family now. Confrontational. She is very worried about her husband. She gets a little labored with her breathing at times. No chest pain. She does walk in the house with her walker. She has chronic dependent edema from  sitting down. She is still drinking massive amounts of soda. This is not going to stop. Goal is basically to keep them both safe. She does tell me that her blood "feels low". Unclear if she is getting Procrit or not.   Past Medical History:  Diagnosis Date  . Abnormal PFTs (pulmonary function tests) 2008   No obstruction noted. Has been followed clinically.  . Anemia    treated with Procrit  . Fall from slip, trip, or stumble 05/07/2016   [place] resulting in right [place] plateau fracture  . Gout   . High risk medication use    on amiodarone.  Marland Kitchen HTN (hypertension)    Last echo in 2004; EF normal  . Hyperlipidemia   . Hypothyroidism    on replacement  . Irregular heart beat "since 1985"  . Obesity   . Type II diabetes mellitus ([place])   . V-tach [facility])    remote VT, treated initially with procainamide, on amiodarone since 2008    Past Surgical History:  Procedure Laterality Date  . CARDIOVASCULAR STRESS TEST  07/07/2003   EF 70%. NO EVIDENCE OF ISCHEMIA  . CARDIOVERSION  1978  . CATARACT EXTRACTION, BILATERAL Bilateral   . CATARACT EXTRACTION, BILATERAL Bilateral   . CESAREAN SECTION  1957; 1961; 1967  . FRACTURE SURGERY    . ORIF TIBIA PLATEAU Right 05/10/2016   Procedure: OPEN REDUCTION INTERNAL FIXATION (ORIF) RIGHT TIBIAL PLATEAU  WITH BONE GRAFT;  Surgeon: Leandrew Koyanagi, MD;  Location: [facility];  Service: Orthopedics;  Laterality: Right;  . US ECHOCARDIOGRAPHY  12/29/2002   EF 60-65%  . VAGINAL HYSTERECTOMY       Medications: Current Meds  Medication Sig  . amiodarone (PACERONE) 100 MG tablet Take 1 tablet by mouth every morning.  Marland Kitchen amLODipine (NORVASC) 5 MG tablet Take 1 tablet by mouth daily.  . ASPIRIN ADULT LOW STRENGTH 81 MG EC tablet Take 1 tablet by mouth daily.  Marland Kitchen Epoetin Alfa (PROCRIT IJ) Inject 10,000 Units as directed every 28 (twenty-eight) days.   . furosemide (LASIX) 20 MG tablet Take 20 mg by mouth daily.  Marland Kitchen glipiZIDE (GLUCOTROL XL) 5 MG 24 hr tablet  Take 1 tablet by mouth daily.  . hydrALAZINE (APRESOLINE) 50 MG tablet Take 1 tablet by mouth twice daily.  Marland Kitchen levothyroxine (SYNTHROID) 100 MCG tablet Take 1 tablet by mouth daily before breakfast.  . Melatonin 5 MG CAPS Take 5 capsules by mouth at bedtime.  . Multiple Vitamin (MULTIVITAMIN WITH MINERALS) TABS tablet Take 1 tablet by mouth daily.  Marland Kitchen omega-3 acid ethyl esters (LOVAZA) 1 g capsule Take 2 capsules by mouth twice daily.  . potassium chloride (KLOR-CON) 10 MEQ tablet Take 1 tablet by mouth every morning.  . pravastatin (PRAVACHOL) 20 MG tablet Take 1 tablet by mouth at bedtime.     Allergies: No Known Allergies  Social History: The patient  reports that she has never smoked. She has never used smokeless tobacco. She reports that she does not drink alcohol and does not use drugs.   Family History: The patient's family history includes Heart disease in her father and mother.   Review of Systems: Please see the history of present illness.   All other systems are reviewed and negative.   Physical Exam: VS:  BP (!) 150/80 (BP Location: Left Arm, Patient Position: Sitting, Cuff Size: Large)   Pulse 83   Ht 5\' 2"  (1.575 m)   Wt 151 lb 6.4 oz (68.7 kg)   SpO2 98% Comment: at rest  BMI 27.69 kg/m  .  BMI Body mass index is 27.69 kg/m.  Wt Readings from Last 3 Encounters:  11/17/19 151 lb 6.4 oz (68.7 kg)  07/07/19 150 lb (68 kg)  02/24/19 158 lb 12.8 oz (72 kg)    General: Alert. She is very hard of hearing. She is in no acute distress. She is in a wheelchair.   Cardiac: Regular rate and rhythm. Outflow murmur noted. 1+ edema bilaterally.  Respiratory:  Lungs are clear to auscultation bilaterally with normal work of breathing.  GI: Soft and nontender.  MS: No deformity or atrophy. Gait and ROM intact.  Skin: Warm and dry. Color is normal.  Neuro:  Strength and sensation are intact and no gross focal deficits noted.  Psych: Alert, appropriate and with normal  affect.   LABORATORY DATA:  EKG:  EKG is ordered today.  Personally reviewed by me. This demonstrates NSR with 1st degree AV block - HR is 83.  Lab Results  Component Value Date   WBC 6.8 07/07/2019   HGB 9.4 (L) 07/07/2019   HCT 28.5 (L) 07/07/2019   PLT 102 (L) 07/07/2019   GLUCOSE 214 (H) 07/07/2019   CHOL 109 07/07/2019   TRIG 228 (H) 07/07/2019   HDL 23 (L) 07/07/2019   LDLCALC 49 07/07/2019   ALT 13 07/07/2019   AST 24 07/07/2019   NA 137 07/07/2019   K 3.9 07/07/2019  CL 101 07/07/2019   CREATININE 2.16 (H) 07/07/2019   BUN 21 07/07/2019   CO2 19 (L) 07/07/2019   TSH 1.730 07/07/2019   INR 1.11 06/18/2014   HGBA1C 6.0 (H) 10/19/2016     BNP (last 3 results) No results for input(s): BNP in the last 8760 hours.  ProBNP (last 3 results) No results for input(s): PROBNP in the last 8760 hours.   Other Studies Reviewed Today:   Echo Study Conclusions from September 2015  - Left ventricle: The cavity size was normal. Wall thickness was normal. Systolic function was normal. The estimated ejection fraction was in the range of 60% to 65%. Left ventricular diastolic function parameters were normal. - Mitral valve: There was mild regurgitation. - Left atrium: The atrium was mildly dilated. - Right atrium: The atrium was mildly dilated.   ASSESSMENT & PLAN:   1. High risk medicine - remains on low dose Amiodarone - EKG is stable.   2. Swelling - chronic - looks stable.   3. Anemia - unclear if she is on Procrit - we may need to refer to Hematology.   4. HTN - BP is fair - no changes made today.   5. History of VT - on low dose amiodarone. EKG is stable.   6. HLD - on Pravachol and Lovaza - I think we could consider stopping this on return.   7. Remote abnormal PFTs - followed just clinically. Her breathing seems stable.   8. Advancing age - sounds to be having some memory issue with personality issues.   Current medicines are reviewed with the  patient today.  The patient does not have concerns regarding medicines other than what has been noted above.  The following changes have been made:  See above.  Labs/ tests ordered today include:    Orders Placed This Encounter  Procedures  . Basic metabolic panel  . CBC  . Hepatic function panel  . TSH  . EKG 12-Lead     Disposition:   FU with me in 4 months.    Patient is agreeable to this plan and will call if any problems develop in the interim.   SignedTruitt Merle, NP  11/17/2019 8:47 AM  [facility] [address] [place], [place]  [address] Phone: [phone] Fax: [phone]

## 2019-11-17 ENCOUNTER — Encounter: Payer: Self-pay | Admitting: Nurse Practitioner

## 2019-11-17 ENCOUNTER — Other Ambulatory Visit: Payer: Self-pay

## 2019-11-17 ENCOUNTER — Ambulatory Visit (INDEPENDENT_AMBULATORY_CARE_PROVIDER_SITE_OTHER): Payer: Medicare Other | Admitting: Nurse Practitioner

## 2019-11-17 VITALS — BP 150/80 | HR 83 | Ht 62.0 in | Wt 151.4 lb

## 2019-11-17 DIAGNOSIS — Z79899 Other long term (current) drug therapy: Secondary | ICD-10-CM | POA: Diagnosis not present

## 2019-11-17 DIAGNOSIS — I472 Ventricular tachycardia, unspecified: Secondary | ICD-10-CM

## 2019-11-17 DIAGNOSIS — I10 Essential (primary) hypertension: Secondary | ICD-10-CM | POA: Diagnosis not present

## 2019-11-17 DIAGNOSIS — R001 Bradycardia, unspecified: Secondary | ICD-10-CM | POA: Diagnosis not present

## 2019-11-17 DIAGNOSIS — E7800 Pure hypercholesterolemia, unspecified: Secondary | ICD-10-CM

## 2019-11-17 NOTE — Patient Instructions (Addendum)
After Visit Summary:  We will be checking the following labs today - BMET, CBC, HPF and TSH   Medication Instructions:    Continue with your current medicines.    If you need a refill on your cardiac medications before your next appointment, please call your pharmacy.     Testing/Procedures To Be Arranged:  N/A  Follow-Up:   See me in 4 months    At [facility], you and your health needs are our priority.  As part of our continuing mission to provide you with exceptional heart care, we have created designated Provider Care Teams.  These Care Teams include your primary Cardiologist (physician) and Advanced Practice Providers (APPs -  Physician Assistants and Nurse Practitioners) who all work together to provide you with the care you need, when you need it.  Special Instructions:  . Stay safe, wash your hands for at least 20 seconds and wear a mask when needed.  . It was good to talk with you today.    Call the [place] office at [phone] if you have any questions, problems or concerns.

## 2019-11-18 ENCOUNTER — Other Ambulatory Visit: Payer: Self-pay | Admitting: *Deleted

## 2019-11-18 DIAGNOSIS — D649 Anemia, unspecified: Secondary | ICD-10-CM

## 2019-11-18 LAB — BASIC METABOLIC PANEL
BUN/Creatinine Ratio: 13 (ref 12–28)
BUN: 29 mg/dL — ABNORMAL HIGH (ref 8–27)
CO2: 20 mmol/L (ref 20–29)
Calcium: 8.8 mg/dL (ref 8.7–10.3)
Chloride: 99 mmol/L (ref 96–106)
Creatinine, Ser: 2.3 mg/dL — ABNORMAL HIGH (ref 0.57–1.00)
GFR calc Af Amer: 21 mL/min/{1.73_m2} — ABNORMAL LOW (ref >59)
GFR calc non Af Amer: 18 mL/min/{1.73_m2} — ABNORMAL LOW (ref >59)
Glucose: 262 mg/dL — ABNORMAL HIGH (ref 65–99)
Potassium: 3.8 mmol/L (ref 3.5–5.2)
Sodium: 139 mmol/L (ref 134–144)

## 2019-11-18 LAB — CBC
Hematocrit: 28.5 % — ABNORMAL LOW (ref 34.0–46.6)
Hemoglobin: 9 g/dL — ABNORMAL LOW (ref 11.1–15.9)
MCH: 27.8 pg (ref 26.6–33.0)
MCHC: 31.6 g/dL (ref 31.5–35.7)
MCV: 88 fL (ref 79–97)
RBC: 3.24 x10E6/uL — ABNORMAL LOW (ref 3.77–5.28)
RDW: 15.9 % — ABNORMAL HIGH (ref 11.7–15.4)
WBC: 6 10*3/uL (ref 3.4–10.8)

## 2019-11-18 LAB — HEPATIC FUNCTION PANEL
ALT: 19 IU/L (ref 0–32)
AST: 42 IU/L — ABNORMAL HIGH (ref 0–40)
Albumin: 3.7 g/dL (ref 3.6–4.6)
Alkaline Phosphatase: 181 IU/L — ABNORMAL HIGH (ref 48–121)
Bilirubin Total: 0.3 mg/dL (ref 0.0–1.2)
Bilirubin, Direct: 0.15 mg/dL (ref 0.00–0.40)
Total Protein: 6.5 g/dL (ref 6.0–8.5)

## 2019-11-18 LAB — TSH: TSH: 2.12 u[IU]/mL (ref 0.450–4.500)

## 2019-11-20 ENCOUNTER — Telehealth: Payer: Self-pay | Admitting: Hematology and Oncology

## 2019-11-20 NOTE — Telephone Encounter (Signed)
Received a new hem referral from Dr. Servando Snare for low hgb. I cld and spoke to Leslie Coleman's dtr and scheduled an appt w/Dr. Lindi Adie on 7/6 at 4pm. Aware to arrive 15 -20 minutes early.

## 2019-11-24 NOTE — Progress Notes (Signed)
[name], [name], [name], MD as Consulting Physician (Internal Medicine)  CHIEF COMPLAINTS/PURPOSE OF CONSULTATION:  Newly diagnosed anemia  HISTORY OF PRESENTING ILLNESS:  Leslie Coleman 86 y.o. female is here because of recent diagnosis of anemia. She is referred by Dr. Servando Snare. Labs on 11/17/19 showed Hg 9.0, HCT 28.5, [name]  She tells me that her husband fell and broke her hip and is in bad shape.  She is in a wheelchair.  She is very hard of hearing.  I reviewed her records extensively and collaborated the history with the patient.  MEDICAL HISTORY:  Past Medical History:  Diagnosis Date  . Abnormal PFTs (pulmonary function tests) 2008   No obstruction noted. Has been followed clinically.  . Anemia    treated with Procrit  . Fall from slip, trip, or stumble 05/07/2016   fell at church resulting in right tibial plateau fracture  . Gout   . High risk medication use    on amiodarone.  Marland Kitchen HTN (hypertension)    Last echo in 2004; EF normal  . Hyperlipidemia   . Hypothyroidism    on replacement  . Irregular heart beat "since 1985"  . Obesity   . Type II diabetes mellitus ([place])   . V-tach [facility])    remote VT, treated initially with procainamide, on amiodarone since 2008    SURGICAL HISTORY: Past Surgical History:  Procedure Laterality Date  . CARDIOVASCULAR STRESS TEST  07/07/2003   EF 70%. NO EVIDENCE  OF ISCHEMIA  . CARDIOVERSION  1978  . CATARACT EXTRACTION, BILATERAL Bilateral   . CATARACT EXTRACTION, BILATERAL Bilateral   . CESAREAN SECTION  1957; 1961; 1967  . FRACTURE SURGERY    . ORIF TIBIA PLATEAU Right 05/10/2016   Procedure: OPEN REDUCTION INTERNAL FIXATION (ORIF) RIGHT TIBIAL PLATEAU  WITH BONE GRAFT;  Surgeon: Leandrew Koyanagi, MD;  Location: [place];  Service: Orthopedics;  Laterality: Right;  . US ECHOCARDIOGRAPHY  12/29/2002   EF 60-65%  . VAGINAL HYSTERECTOMY      SOCIAL HISTORY: Social History   Socioeconomic History  . Marital status: Married    Spouse name: Not on file  . Number of children: Not on file  . Years of education: Not on file  . Highest education level: Not on file  Occupational History  . Not on file  Tobacco Use  . Smoking status: Never Smoker  . Smokeless tobacco: Never Used  Vaping Use  . Vaping Use: Never used  Substance and Sexual Activity  . Alcohol use: No  . Drug use: No  . Sexual activity: Not Currently  Other Topics Concern  . Not on file  Social History Narrative   Lives with spouse in [place]   Social Determinants of Health   Financial Resource Strain:   . Difficulty of Paying Living Expenses:   Food Insecurity:   . Worried About Charity fundraiser in the Last Year:   . [employer] of  Food in the Last Year:   Transportation Needs:   . Film/video editor (Medical):   Marland Kitchen Lack of Transportation (Non-Medical):   Physical Activity:   . Days of Exercise per Week:   . Minutes of Exercise per Session:   Stress:   . Feeling of Stress :   Social Connections:   . Frequency of Communication with Friends and Family:   . Frequency of Social Gatherings with Friends and Family:   . Attends Religious Services:   . Active Member of Clubs or Organizations:   . Attends Archivist Meetings:   Marland Kitchen Marital Status:   Intimate Partner Violence:   . Fear of Current or Ex-Partner:   . Emotionally Abused:   Marland Kitchen Physically Abused:   .  Sexually Abused:     FAMILY HISTORY: Family History  Problem Relation Age of Onset  . Heart disease Mother   . Heart disease Father     ALLERGIES:  has No Known Allergies.  MEDICATIONS:  Current Outpatient Medications  Medication Sig Dispense Refill  . amiodarone (PACERONE) 100 MG tablet Take 1 tablet by mouth every morning. 30 tablet 10  . amLODipine (NORVASC) 5 MG tablet Take 1 tablet by mouth daily. 30 tablet 11  . ASPIRIN ADULT LOW STRENGTH 81 MG EC tablet Take 1 tablet by mouth daily. 30 tablet 11  . Epoetin Alfa (PROCRIT IJ) Inject 10,000 Units as directed every 28 (twenty-eight) days.     . furosemide (LASIX) 20 MG tablet Take 20 mg by mouth daily.    Marland Kitchen glipiZIDE (GLUCOTROL XL) 5 MG 24 hr tablet Take 1 tablet by mouth daily. 30 tablet 11  . hydrALAZINE (APRESOLINE) 50 MG tablet Take 1 tablet by mouth twice daily. 60 tablet 11  . levothyroxine (SYNTHROID) 100 MCG tablet Take 1 tablet by mouth daily before breakfast. 90 tablet 2  . Melatonin 5 MG CAPS Take 5 capsules by mouth at bedtime.    . Multiple Vitamin (MULTIVITAMIN WITH MINERALS) TABS tablet Take 1 tablet by mouth daily.    Marland Kitchen omega-3 acid ethyl esters (LOVAZA) 1 g capsule Take 2 capsules by mouth twice daily. 360 capsule 3  . potassium chloride (KLOR-CON) 10 MEQ tablet Take 1 tablet by mouth every morning. 30 tablet 11  . pravastatin (PRAVACHOL) 20 MG tablet Take 1 tablet by mouth at bedtime. 30 tablet 11   No current facility-administered medications for this visit.    REVIEW OF SYSTEMS:   Constitutional: Denies fevers, chills or abnormal night sweats Eyes: Denies blurriness of vision, double vision or watery eyes Ears, nose, mouth, throat, and face: Denies mucositis or sore throat Respiratory: Denies cough, dyspnea or wheezes Cardiovascular: Denies palpitation, chest discomfort or lower extremity swelling Gastrointestinal:  Denies nausea, heartburn or change in bowel habits Skin: Denies abnormal skin  rashes Lymphatics: Denies new lymphadenopathy or easy bruising Neurological:Denies numbness, tingling or new weaknesses Behavioral/Psych: Mood is stable, no new changes  Breast: Denies any palpable lumps or discharge All other systems were reviewed with the patient and are negative.  PHYSICAL EXAMINATION: ECOG PERFORMANCE STATUS: 1 - Symptomatic but completely ambulatory  Vitals:   11/25/19 1600  BP: 90/61  Pulse: 95  Resp: 18  Temp: 98.5 F (36.9 C)  SpO2: 98%   Filed Weights   11/25/19 1600  Weight: 152 lb 1.6 oz (69 kg)    GENERAL:alert, no distress and comfortable SKIN: skin color, texture, turgor are normal, no rashes or significant lesions EYES: normal, conjunctiva are pink  and non-injected, sclera clear OROPHARYNX:no exudate, no erythema and lips, buccal mucosa, and tongue normal  NECK: supple, thyroid normal size, non-tender, without nodularity LYMPH:  no palpable lymphadenopathy in the cervical, axillary or inguinal LUNGS: clear to auscultation and percussion with normal breathing effort HEART: regular rate & rhythm and no murmurs and no lower extremity edema ABDOMEN:abdomen soft, non-tender and normal bowel sounds Musculoskeletal:no cyanosis of digits and no clubbing  PSYCH: alert & oriented x 3 with fluent speech NEURO: no focal motor/sensory deficits  LABORATORY DATA:  I have reviewed the data as listed Lab Results  Component Value Date   WBC 6.0 11/17/2019   HGB 9.0 (L) 11/17/2019   HCT 28.5 (L) 11/17/2019   MCV 88 11/17/2019   PLT CANCELED 11/17/2019   Lab Results  Component Value Date   NA 139 11/17/2019   K 3.8 11/17/2019   CL 99 11/17/2019   CO2 20 11/17/2019    RADIOGRAPHIC STUDIES: I have personally reviewed the radiological reports and agreed with the findings in the report.  ASSESSMENT AND PLAN:  Anemia in chronic kidney disease Lab review 03/10/2018: Hemoglobin 11 04/07/2019: Hemoglobin 9.9 07/08/2019: Hemoglobin 9.4, MCV 83,  platelets 100 11/17/2019: Hemoglobin 9, MCV 28.5, RDW 15.9, platelets clumped  Differential diagnosis: Most likely cause of anemia is anemia of chronic kidney disease.  Patient was previously receiving Procrit injections once a month.  This has stopped 6 months ago for no reason. Kathrynn Humble has referred the patient to [place] to resume Procrit injections. We will get authorization and begin Procrit shots starting this Saturday, 11/29/2019. After that we can see her once a month with labs and injection appointments. I will follow with her in 3 months.   All questions were answered. The patient knows to call the clinic with any problems, questions or concerns.   Rulon Eisenmenger, MD, MPH 11/25/2019    I, Molly Dorshimer, am acting as scribe for Nicholas Lose, MD.  I have reviewed the above documentation for accuracy and completeness, and I agree with the above.

## 2019-11-25 ENCOUNTER — Other Ambulatory Visit: Payer: Self-pay

## 2019-11-25 ENCOUNTER — Inpatient Hospital Stay: Payer: Medicare Other | Attending: Hematology and Oncology | Admitting: Hematology and Oncology

## 2019-11-25 ENCOUNTER — Ambulatory Visit: Payer: Medicare Other

## 2019-11-25 ENCOUNTER — Telehealth: Payer: Self-pay | Admitting: Hematology and Oncology

## 2019-11-25 DIAGNOSIS — E1122 Type 2 diabetes mellitus with diabetic chronic kidney disease: Secondary | ICD-10-CM

## 2019-11-25 DIAGNOSIS — D631 Anemia in chronic kidney disease: Secondary | ICD-10-CM | POA: Insufficient documentation

## 2019-11-25 DIAGNOSIS — N1832 Chronic kidney disease, stage 3b: Secondary | ICD-10-CM | POA: Diagnosis not present

## 2019-11-25 DIAGNOSIS — I129 Hypertensive chronic kidney disease with stage 1 through stage 4 chronic kidney disease, or unspecified chronic kidney disease: Secondary | ICD-10-CM | POA: Insufficient documentation

## 2019-11-25 NOTE — Assessment & Plan Note (Signed)
Lab review 03/10/2018: Hemoglobin 11 04/07/2019: Hemoglobin 9.9 07/08/2019: Hemoglobin 9.4, MCV 83, platelets 100 11/17/2019: Hemoglobin 9, MCV 28.5, RDW 15.9, platelets clumped  Differential diagnosis: Most likely cause of anemia is anemia of chronic kidney disease.  Workup: 1. CBC with differential to evaluate the smear 2. CMP to evaluate liver and kidney function 3. Haptoglobin, LDH, reticulocyte count to evaluate hemolysis 4. SPEP 6. Iron and W-10 and folic acid levels 7.  Erythropoietin level  MyChart virtual visit in 1 week to discuss results. We briefly discussed the role of erythropoietin stimulating agents.

## 2019-11-25 NOTE — Telephone Encounter (Signed)
Scheduled appts per 7/2 los. Gave pt a print out of AVS

## 2019-11-29 ENCOUNTER — Inpatient Hospital Stay: Payer: Medicare Other

## 2019-11-29 ENCOUNTER — Other Ambulatory Visit: Payer: Self-pay

## 2019-11-29 VITALS — BP 155/91 | HR 92 | Temp 97.5°F | Resp 17

## 2019-11-29 DIAGNOSIS — I129 Hypertensive chronic kidney disease with stage 1 through stage 4 chronic kidney disease, or unspecified chronic kidney disease: Secondary | ICD-10-CM | POA: Diagnosis not present

## 2019-11-29 DIAGNOSIS — D631 Anemia in chronic kidney disease: Secondary | ICD-10-CM | POA: Diagnosis not present

## 2019-11-29 DIAGNOSIS — E1122 Type 2 diabetes mellitus with diabetic chronic kidney disease: Secondary | ICD-10-CM | POA: Diagnosis not present

## 2019-11-29 DIAGNOSIS — N1832 Chronic kidney disease, stage 3b: Secondary | ICD-10-CM | POA: Diagnosis not present

## 2019-11-29 MED ORDER — EPOETIN ALFA-EPBX 40000 UNIT/ML IJ SOLN
40000.0000 [IU] | Freq: Once | INTRAMUSCULAR | Status: AC
  Administered 2019-11-29: 40000 [IU] via SUBCUTANEOUS

## 2019-11-29 NOTE — Patient Instructions (Signed)

## 2019-11-29 NOTE — Progress Notes (Signed)
Per Dr Waldon Reining to get injection using 11/17/19 lab results

## 2019-12-02 ENCOUNTER — Other Ambulatory Visit: Payer: Self-pay | Admitting: Nurse Practitioner

## 2019-12-30 ENCOUNTER — Ambulatory Visit: Payer: Medicare Other

## 2019-12-30 ENCOUNTER — Other Ambulatory Visit: Payer: Medicare Other

## 2019-12-30 ENCOUNTER — Other Ambulatory Visit: Payer: Self-pay | Admitting: *Deleted

## 2019-12-30 DIAGNOSIS — N1832 Chronic kidney disease, stage 3b: Secondary | ICD-10-CM

## 2019-12-31 ENCOUNTER — Inpatient Hospital Stay: Payer: Medicare Other

## 2019-12-31 ENCOUNTER — Other Ambulatory Visit: Payer: Self-pay

## 2019-12-31 ENCOUNTER — Inpatient Hospital Stay: Payer: Medicare Other | Attending: Hematology and Oncology

## 2019-12-31 VITALS — BP 170/77 | HR 72 | Resp 18

## 2019-12-31 DIAGNOSIS — N189 Chronic kidney disease, unspecified: Secondary | ICD-10-CM | POA: Insufficient documentation

## 2019-12-31 DIAGNOSIS — D631 Anemia in chronic kidney disease: Secondary | ICD-10-CM | POA: Diagnosis not present

## 2019-12-31 LAB — CBC WITH DIFFERENTIAL (CANCER CENTER ONLY)
Abs Immature Granulocytes: 0.04 10*3/uL (ref 0.00–0.07)
Basophils Absolute: 0 10*3/uL (ref 0.0–0.1)
Basophils Relative: 1 %
Eosinophils Absolute: 0.3 10*3/uL (ref 0.0–0.5)
Eosinophils Relative: 4 %
HCT: 28.7 % — ABNORMAL LOW (ref 36.0–46.0)
Hemoglobin: 9.6 g/dL — ABNORMAL LOW (ref 12.0–15.0)
Immature Granulocytes: 1 %
Lymphocytes Relative: 23 %
Lymphs Abs: 1.6 10*3/uL (ref 0.7–4.0)
MCH: 27.7 pg (ref 26.0–34.0)
MCHC: 33.4 g/dL (ref 30.0–36.0)
MCV: 82.7 fL (ref 80.0–100.0)
Monocytes Absolute: 0.6 10*3/uL (ref 0.1–1.0)
Monocytes Relative: 9 %
Neutro Abs: 4.3 10*3/uL (ref 1.7–7.7)
Neutrophils Relative %: 62 %
Platelet Count: 128 10*3/uL — ABNORMAL LOW (ref 150–400)
RBC: 3.47 MIL/uL — ABNORMAL LOW (ref 3.87–5.11)
RDW: 15.2 % (ref 11.5–15.5)
WBC Count: 6.8 10*3/uL (ref 4.0–10.5)
nRBC: 0 % (ref 0.0–0.2)

## 2019-12-31 LAB — CMP (CANCER CENTER ONLY)
ALT: 15 U/L (ref 0–44)
AST: 27 U/L (ref 15–41)
Albumin: 3.3 g/dL — ABNORMAL LOW (ref 3.5–5.0)
Alkaline Phosphatase: 197 U/L — ABNORMAL HIGH (ref 38–126)
Anion gap: 13 (ref 5–15)
BUN: 30 mg/dL — ABNORMAL HIGH (ref 8–23)
CO2: 20 mmol/L — ABNORMAL LOW (ref 22–32)
Calcium: 9.7 mg/dL (ref 8.9–10.3)
Chloride: 103 mmol/L (ref 98–111)
Creatinine: 2.43 mg/dL — ABNORMAL HIGH (ref 0.44–1.00)
GFR, Est AFR Am: 20 mL/min — ABNORMAL LOW (ref >60)
GFR, Estimated: 17 mL/min — ABNORMAL LOW (ref >60)
Glucose, Bld: 291 mg/dL — ABNORMAL HIGH (ref 70–99)
Potassium: 3.6 mmol/L (ref 3.5–5.1)
Sodium: 136 mmol/L (ref 135–145)
Total Bilirubin: 0.4 mg/dL (ref 0.3–1.2)
Total Protein: 7.6 g/dL (ref 6.5–8.1)

## 2019-12-31 MED ORDER — EPOETIN ALFA-EPBX 40000 UNIT/ML IJ SOLN
INTRAMUSCULAR | Status: AC
  Filled 2019-12-31: qty 1

## 2019-12-31 MED ORDER — EPOETIN ALFA-EPBX 40000 UNIT/ML IJ SOLN
40000.0000 [IU] | Freq: Once | INTRAMUSCULAR | Status: AC
  Administered 2019-12-31: 40000 [IU] via SUBCUTANEOUS

## 2019-12-31 NOTE — Patient Instructions (Signed)

## 2020-01-01 MED ORDER — ACETAMINOPHEN 325 MG PO TABS
ORAL_TABLET | ORAL | Status: AC
  Filled 2020-01-01: qty 2

## 2020-01-01 MED ORDER — POTASSIUM CHLORIDE 10 MEQ/100ML IV SOLN
INTRAVENOUS | Status: AC
  Filled 2020-01-01: qty 200

## 2020-01-01 MED ORDER — LORATADINE 10 MG PO TABS
ORAL_TABLET | ORAL | Status: AC
  Filled 2020-01-01: qty 1

## 2020-01-01 MED ORDER — PALONOSETRON HCL INJECTION 0.25 MG/5ML
INTRAVENOUS | Status: AC
  Filled 2020-01-01: qty 5

## 2020-01-28 ENCOUNTER — Inpatient Hospital Stay: Payer: Medicare Other

## 2020-01-28 ENCOUNTER — Other Ambulatory Visit: Payer: Self-pay

## 2020-01-28 ENCOUNTER — Inpatient Hospital Stay: Payer: Medicare Other | Attending: Hematology and Oncology

## 2020-01-28 ENCOUNTER — Telehealth: Payer: Self-pay | Admitting: Nurse Practitioner

## 2020-01-28 VITALS — BP 173/85 | HR 66 | Temp 98.0°F | Resp 18

## 2020-01-28 DIAGNOSIS — N189 Chronic kidney disease, unspecified: Secondary | ICD-10-CM | POA: Diagnosis not present

## 2020-01-28 DIAGNOSIS — D631 Anemia in chronic kidney disease: Secondary | ICD-10-CM

## 2020-01-28 DIAGNOSIS — N1832 Chronic kidney disease, stage 3b: Secondary | ICD-10-CM

## 2020-01-28 LAB — CBC WITH DIFFERENTIAL (CANCER CENTER ONLY)
Abs Immature Granulocytes: 0.02 10*3/uL (ref 0.00–0.07)
Basophils Absolute: 0 10*3/uL (ref 0.0–0.1)
Basophils Relative: 1 %
Eosinophils Absolute: 0.3 10*3/uL (ref 0.0–0.5)
Eosinophils Relative: 4 %
HCT: 29 % — ABNORMAL LOW (ref 36.0–46.0)
Hemoglobin: 9.7 g/dL — ABNORMAL LOW (ref 12.0–15.0)
Immature Granulocytes: 0 %
Lymphocytes Relative: 31 %
Lymphs Abs: 2.2 10*3/uL (ref 0.7–4.0)
MCH: 28 pg (ref 26.0–34.0)
MCHC: 33.4 g/dL (ref 30.0–36.0)
MCV: 83.6 fL (ref 80.0–100.0)
Monocytes Absolute: 0.6 10*3/uL (ref 0.1–1.0)
Monocytes Relative: 8 %
Neutro Abs: 4 10*3/uL (ref 1.7–7.7)
Neutrophils Relative %: 56 %
Platelet Count: 103 10*3/uL — ABNORMAL LOW (ref 150–400)
RBC: 3.47 MIL/uL — ABNORMAL LOW (ref 3.87–5.11)
RDW: 15.3 % (ref 11.5–15.5)
WBC Count: 7.1 10*3/uL (ref 4.0–10.5)
nRBC: 0 % (ref 0.0–0.2)

## 2020-01-28 LAB — CMP (CANCER CENTER ONLY)
ALT: 15 U/L (ref 0–44)
AST: 28 U/L (ref 15–41)
Albumin: 3.4 g/dL — ABNORMAL LOW (ref 3.5–5.0)
Alkaline Phosphatase: 186 U/L — ABNORMAL HIGH (ref 38–126)
Anion gap: 12 (ref 5–15)
BUN: 32 mg/dL — ABNORMAL HIGH (ref 8–23)
CO2: 21 mmol/L — ABNORMAL LOW (ref 22–32)
Calcium: 9.1 mg/dL (ref 8.9–10.3)
Chloride: 101 mmol/L (ref 98–111)
Creatinine: 2.49 mg/dL — ABNORMAL HIGH (ref 0.44–1.00)
GFR, Est AFR Am: 19 mL/min — ABNORMAL LOW (ref >60)
GFR, Estimated: 17 mL/min — ABNORMAL LOW (ref >60)
Glucose, Bld: 384 mg/dL — ABNORMAL HIGH (ref 70–99)
Potassium: 3.7 mmol/L (ref 3.5–5.1)
Sodium: 134 mmol/L — ABNORMAL LOW (ref 135–145)
Total Bilirubin: 0.3 mg/dL (ref 0.3–1.2)
Total Protein: 7.2 g/dL (ref 6.5–8.1)

## 2020-01-28 MED ORDER — EPOETIN ALFA-EPBX 40000 UNIT/ML IJ SOLN
40000.0000 [IU] | Freq: Once | INTRAMUSCULAR | Status: AC
  Administered 2020-01-28: 40000 [IU] via SUBCUTANEOUS

## 2020-01-28 MED ORDER — EPOETIN ALFA-EPBX 40000 UNIT/ML IJ SOLN
INTRAMUSCULAR | Status: AC
  Filled 2020-01-28: qty 1

## 2020-01-28 NOTE — Telephone Encounter (Signed)
Left message to call office

## 2020-01-28 NOTE — Patient Instructions (Signed)

## 2020-01-28 NOTE — Telephone Encounter (Signed)
Pt c/o BP issue: STAT if pt c/o blurred vision, one-sided weakness or slurred speech  1. What are your last 5 BP readings? 176/69 , 179/66, 173/89  2. Are you having any other symptoms (ex. Dizziness, headache, blurred vision, passed out)? No symptoms   3. What is your BP issue? High BP

## 2020-01-29 NOTE — Telephone Encounter (Signed)
Let's monitor for the next 2 weeks with trying to cut back on salt - then ask them to send [place] an update - for now, no change in her medicines.   Cecille Rubin

## 2020-01-29 NOTE — Telephone Encounter (Signed)
I spoke with [name] and gave her information from Kathrene Alu

## 2020-01-29 NOTE — Telephone Encounter (Signed)
Leslie Coleman is returning Leslie Coleman's call.

## 2020-01-29 NOTE — Telephone Encounter (Signed)
I spoke with patient's daughter. She reports BP has been high recently when patient getting Procrit infusions.  These readings are in chart.-  9/8-173/85 ,  8/11-170/77,  7/10- 155/123 and 155/91. They have a nurse Nira Conn) coming to the house daily to care for patient's husband  Nira Conn will now start checking patient's BP also but has not been doing so prior to yesterday.  Last night patient's BP was 146/79. Daughter states it is OK to Biomedical scientist at home number listed for patient. Patient is feeling fine.  She has been snacking on salty foods such as chips and eating take out from [employer].  Nurse also fixes meals for patients.  Daughter will have patient try to decrease salt intake.  I asked daughter to have nurse check BP and heart rate daily about 2 hours after taking morning medications and keep record. Will forward to Truitt Merle, NP for review/recommendations

## 2020-01-30 ENCOUNTER — Ambulatory Visit: Payer: Medicare Other

## 2020-01-30 ENCOUNTER — Other Ambulatory Visit: Payer: Medicare Other

## 2020-01-31 ENCOUNTER — Other Ambulatory Visit: Payer: Self-pay | Admitting: Nurse Practitioner

## 2020-02-29 NOTE — Progress Notes (Signed)
Patient Care Team: [name], [name], [name], MD as Consulting Physician (Internal Medicine)  DIAGNOSIS:    ICD-10-CM   1. Anemia due to stage 3b chronic kidney disease (HCC)  N18.32    D63.1     CHIEF COMPLIANT: Follow-up of anemia of chronic kidney disease  INTERVAL HISTORY: Leslie Coleman is a 86 y.o. with above-mentioned history of anemia of chronic kidney disease currently receiving Procrit injections. She presents to the clinic today for follow-up. She is in a wheelchair and is hard of hearing. She tells me that she feels fine. Denies any problems getting the injections.  ALLERGIES:  has No Known Allergies.  MEDICATIONS:  Current Outpatient Medications  Medication Sig Dispense Refill  . amiodarone (PACERONE) 100 MG tablet Take 1 tablet by mouth every morning. 30 tablet 10  . amLODipine (NORVASC) 5 MG tablet Take 1 tablet by mouth daily. 30 tablet 11  . ASPIRIN ADULT LOW STRENGTH 81 MG EC tablet Take 1 tablet by mouth daily. 30 tablet 11  . Epoetin Alfa (PROCRIT IJ) Inject 10,000 Units as directed every 28 (twenty-eight) days.     . furosemide (LASIX) 20 MG tablet Take 20 mg by mouth daily.    Marland Kitchen glipiZIDE (GLUCOTROL XL) 5 MG 24 hr tablet Take 1 tablet by mouth daily. 30 tablet 11  . hydrALAZINE (APRESOLINE) 50 MG tablet Take 1 tablet by mouth twice daily. 60 tablet 11  . levothyroxine (SYNTHROID) 100 MCG tablet Take 1 tablet by mouth daily before breakfast. 90 tablet 1  . Melatonin 5 MG CAPS Take 5 capsules by mouth at bedtime.    . Multiple Vitamin (MULTIVITAMIN WITH MINERALS) TABS tablet Take 1 tablet by mouth daily.    Marland Kitchen omega-3 acid ethyl esters (LOVAZA) 1 g capsule Take 2 capsules by mouth twice daily. 360 capsule 3  . potassium chloride (KLOR-CON) 10 MEQ tablet Take 1 tablet by mouth every morning. 30 tablet 11  . pravastatin (PRAVACHOL) 20 MG tablet  Take 1 tablet by mouth at bedtime. 30 tablet 11   No current facility-administered medications for this visit.    PHYSICAL EXAMINATION: ECOG PERFORMANCE STATUS: 1 - Symptomatic but completely ambulatory  Vitals:   03/01/20 1040  BP: (!) 170/70  Pulse: 74  Resp: 18  Temp: (!) 96 F (35.6 C)  SpO2: 97%   Filed Weights   03/01/20 1040  Weight: 149 lb 3.2 oz (67.7 kg)    LABORATORY DATA:  I have reviewed the data as listed CMP Latest Ref Rng & Units 03/01/2020 01/28/2020 12/31/2019  Glucose 70 - 99 mg/dL 252(H) 384(H) 291(H)  BUN 8 - 23 mg/dL 49(H) 32(H) 30(H)  Creatinine 0.44 - 1.00 mg/dL 2.62(H) 2.49(H) 2.43(H)  Sodium 135 - 145 mmol/L 135 134(L) 136  Potassium 3.5 - 5.1 mmol/L 3.9 3.7 3.6  Chloride 98 - 111 mmol/L 102 101 103  CO2 22 - 32 mmol/L 24 21(L) 20(L)  Calcium 8.9 - 10.3 mg/dL 9.4 9.1 9.7  Total Protein 6.5 - 8.1 g/dL 7.2 7.2 7.6  Total Bilirubin 0.3 - 1.2 mg/dL 0.3 0.3 0.4  Alkaline Phos 38 - 126 U/L 172(H) 186(H) 197(H)  AST 15 - 41 U/L 31 28 27   ALT 0 - 44 U/L 19 15 15     Lab Results  Component Value Date   WBC 7.5 03/01/2020   HGB 9.7 (L) 03/01/2020   HCT 29.6 (L) 03/01/2020  MCV 84.1 03/01/2020   PLT 119 (L) 03/01/2020   NEUTROABS 4.7 03/01/2020    ASSESSMENT & PLAN:  Anemia due to chronic kidney disease Lab review 03/10/2018: Hemoglobin 11 04/07/2019: Hemoglobin 9.9 07/08/2019: Hemoglobin 9.4, MCV 83, platelets 100 11/17/2019: Hemoglobin 9, platelets clumped 01/28/2020: Hemoglobin 9.7, MCV 83.6, platelets 103  Current treatment: Retacrit 40,000 units monthly  Continue with the Retacrit injections as long as the hemoglobin stays below 10 g. I reviewed the lab work with the patient and her family. Even though it is unclear as to how much benefit she is getting from these injections, because it is holding her hemoglobin steady we will continue this for the time being.   Wheelchair dependent: Poor performance status. I discussed with the family  that as long as she is interested and is willing to come for these injections we'll continue with the plan.  I will see her back every 3 months to review her labs and follow-up. No orders of the defined types were placed in this encounter.  The patient has a good understanding of the overall plan. she agrees with it. she will call with any problems that may develop before the next visit here.  Total time spent: 20 mins including face to face time and time spent for planning, charting and coordination of care  Nicholas Lose, MD 03/01/2020  I, Cloyde Reams Dorshimer, am acting as scribe for Dr. Nicholas Lose.  I have reviewed the above documentation for accuracy and completeness, and I agree with the above.

## 2020-03-01 ENCOUNTER — Inpatient Hospital Stay (HOSPITAL_BASED_OUTPATIENT_CLINIC_OR_DEPARTMENT_OTHER): Payer: Medicare Other | Admitting: Hematology and Oncology

## 2020-03-01 ENCOUNTER — Other Ambulatory Visit: Payer: Self-pay

## 2020-03-01 ENCOUNTER — Inpatient Hospital Stay: Payer: Medicare Other | Attending: Hematology and Oncology

## 2020-03-01 ENCOUNTER — Inpatient Hospital Stay: Payer: Medicare Other

## 2020-03-01 DIAGNOSIS — D631 Anemia in chronic kidney disease: Secondary | ICD-10-CM

## 2020-03-01 DIAGNOSIS — N189 Chronic kidney disease, unspecified: Secondary | ICD-10-CM | POA: Diagnosis not present

## 2020-03-01 DIAGNOSIS — N1832 Chronic kidney disease, stage 3b: Secondary | ICD-10-CM | POA: Diagnosis not present

## 2020-03-01 LAB — CBC WITH DIFFERENTIAL (CANCER CENTER ONLY)
Abs Immature Granulocytes: 0.02 10*3/uL (ref 0.00–0.07)
Basophils Absolute: 0.1 10*3/uL (ref 0.0–0.1)
Basophils Relative: 1 %
Eosinophils Absolute: 0.5 10*3/uL (ref 0.0–0.5)
Eosinophils Relative: 6 %
HCT: 29.6 % — ABNORMAL LOW (ref 36.0–46.0)
Hemoglobin: 9.7 g/dL — ABNORMAL LOW (ref 12.0–15.0)
Immature Granulocytes: 0 %
Lymphocytes Relative: 22 %
Lymphs Abs: 1.7 10*3/uL (ref 0.7–4.0)
MCH: 27.6 pg (ref 26.0–34.0)
MCHC: 32.8 g/dL (ref 30.0–36.0)
MCV: 84.1 fL (ref 80.0–100.0)
Monocytes Absolute: 0.6 10*3/uL (ref 0.1–1.0)
Monocytes Relative: 8 %
Neutro Abs: 4.7 10*3/uL (ref 1.7–7.7)
Neutrophils Relative %: 63 %
Platelet Count: 119 10*3/uL — ABNORMAL LOW (ref 150–400)
RBC: 3.52 MIL/uL — ABNORMAL LOW (ref 3.87–5.11)
RDW: 15.3 % (ref 11.5–15.5)
WBC Count: 7.5 10*3/uL (ref 4.0–10.5)
nRBC: 0 % (ref 0.0–0.2)

## 2020-03-01 LAB — CMP (CANCER CENTER ONLY)
ALT: 19 U/L (ref 0–44)
AST: 31 U/L (ref 15–41)
Albumin: 3.4 g/dL — ABNORMAL LOW (ref 3.5–5.0)
Alkaline Phosphatase: 172 U/L — ABNORMAL HIGH (ref 38–126)
Anion gap: 9 (ref 5–15)
BUN: 49 mg/dL — ABNORMAL HIGH (ref 8–23)
CO2: 24 mmol/L (ref 22–32)
Calcium: 9.4 mg/dL (ref 8.9–10.3)
Chloride: 102 mmol/L (ref 98–111)
Creatinine: 2.62 mg/dL — ABNORMAL HIGH (ref 0.44–1.00)
GFR, Estimated: 16 mL/min — ABNORMAL LOW (ref >60)
Glucose, Bld: 252 mg/dL — ABNORMAL HIGH (ref 70–99)
Potassium: 3.9 mmol/L (ref 3.5–5.1)
Sodium: 135 mmol/L (ref 135–145)
Total Bilirubin: 0.3 mg/dL (ref 0.3–1.2)
Total Protein: 7.2 g/dL (ref 6.5–8.1)

## 2020-03-01 MED ORDER — EPOETIN ALFA-EPBX 40000 UNIT/ML IJ SOLN
40000.0000 [IU] | Freq: Once | INTRAMUSCULAR | Status: AC
  Administered 2020-03-01: 40000 [IU] via SUBCUTANEOUS

## 2020-03-01 MED ORDER — EPOETIN ALFA-EPBX 40000 UNIT/ML IJ SOLN
INTRAMUSCULAR | Status: AC
  Filled 2020-03-01: qty 1

## 2020-03-01 NOTE — Patient Instructions (Signed)

## 2020-03-01 NOTE — Assessment & Plan Note (Signed)
Lab review 03/10/2018: Hemoglobin 11 04/07/2019: Hemoglobin 9.9 07/08/2019: Hemoglobin 9.4, MCV 83, platelets 100 11/17/2019: Hemoglobin 9, platelets clumped 01/28/2020: Hemoglobin 9.7, MCV 83.6, platelets 103  Current treatment: Retacrit 40,000 units monthly  Continue with the Retacrit injections as long as the hemoglobin stays below 10 g.

## 2020-03-03 ENCOUNTER — Telehealth: Payer: Self-pay | Admitting: Hematology and Oncology

## 2020-03-03 NOTE — Telephone Encounter (Signed)
No 10/11 los, no changes made to pt schedule

## 2020-03-09 ENCOUNTER — Ambulatory Visit: Payer: Medicare Other | Admitting: Nurse Practitioner

## 2020-03-22 NOTE — Progress Notes (Signed)
CARDIOLOGY OFFICE NOTE  Date:  04/05/2020    Leslie Coleman Date of Birth: 1929-01-15 Medical Record #[id number]  PCP:  [name], MD  Cardiologist:  Servando Snare Allred   Chief Complaint  Patient presents with  . Follow-up    History of Present Illness: Leslie Coleman is a 86 y.o. female who presents today for a follow up visit. Seen for Dr. Rayann Heman - former patient of Dr. Susa Simmonds.She primarily follows with me.  She has a remote history of VT - treated initially with procainamide but switched to amiodarone back in 2008 due to unavailability of the medicine. Does have chronically abnormal PFTs. Other issues include HTN, gout, obesity, DM and anemia. On chronic Procrit.  I have followed her thru the years. She has gotten progressively feeble and confused. More falls. She has had a broken leg due to a fall and required SNF stay. More sedentary. No longer on beta blocker due to bradycardia. She has had issues with swelling as well as dehydration. Medicines have always been a little difficult to discern at times.Typically she will drink massive quantities of soda- no intention of stopping. Also on Aleve despite advising not to take. She was off her Procrit - had not seen PCP - thought I was going to do her primary care - tried to get her back to primary care. Very sad/challenging situation. Family is supportive but easily frustrated.   Last seen in June - has no interest in getting COVID vaccine. Chronic dependent edema. Drinking massive amounts of soda. More confrontational with family. Husband is 95 and they live together. Family doing the best they can.   Comes in today. Here with her son in law. She remains in a wheelchair. Husband just turned 30. Her counts are a little low. They would like a flu shot - we do not have anymore high doses. No real issues. She is drinking less soda and now more water. No chest pain. Breathing is stable. She is very hard of hearing. No  worrisome issues noted.    Past Medical History:  Diagnosis Date  . Abnormal PFTs (pulmonary function tests) 2008   No obstruction noted. Has been followed clinically.  . Anemia    treated with Procrit  . Fall from slip, trip, or stumble 05/07/2016   [place] resulting in right tibial plateau fracture  . Gout   . High risk medication use    on amiodarone.  Marland Kitchen HTN (hypertension)    Last echo in 2004; EF normal  . Hyperlipidemia   . Hypothyroidism    on replacement  . Irregular heart beat "since 1985"  . Obesity   . Type II diabetes mellitus ([place])   . [facility])    remote VT, treated initially with procainamide, on amiodarone since 2008    Past Surgical History:  Procedure Laterality Date  . CARDIOVASCULAR STRESS TEST  07/07/2003   EF 70%. NO EVIDENCE OF ISCHEMIA  . CARDIOVERSION  1978  . CATARACT EXTRACTION, BILATERAL Bilateral   . CATARACT EXTRACTION, BILATERAL Bilateral   . CESAREAN SECTION  1957; 1961; 1967  . FRACTURE SURGERY    . ORIF TIBIA PLATEAU Right 05/10/2016   Procedure: OPEN REDUCTION INTERNAL FIXATION (ORIF) RIGHT TIBIAL PLATEAU  WITH BONE GRAFT;  Surgeon: Leandrew Koyanagi, MD;  Location: [place];  Service: Orthopedics;  Laterality: Right;  . US ECHOCARDIOGRAPHY  12/29/2002   EF 60-65%  . VAGINAL HYSTERECTOMY  Medications: Current Meds  Medication Sig  . amiodarone (PACERONE) 100 MG tablet Take 1 tablet by mouth every morning.  Marland Kitchen amLODipine (NORVASC) 5 MG tablet Take 1 tablet by mouth daily.  . ASPIRIN LOW DOSE 81 MG EC tablet Take 1 tablet by mouth daily.  Marland Kitchen Epoetin Alfa (PROCRIT IJ) Inject 10,000 Units as directed every 28 (twenty-eight) days.   . furosemide (LASIX) 20 MG tablet Take 20 mg by mouth daily.  Marland Kitchen glipiZIDE (GLUCOTROL XL) 5 MG 24 hr tablet Take 1 tablet by mouth daily.  . hydrALAZINE (APRESOLINE) 50 MG tablet Take 1 tablet by mouth twice daily.  Marland Kitchen levothyroxine (SYNTHROID) 100 MCG tablet Take 1 tablet by mouth daily before breakfast.    . Melatonin 5 MG CAPS Take 5 capsules by mouth at bedtime.  . Multiple Vitamin (MULTIVITAMIN WITH MINERALS) TABS tablet Take 1 tablet by mouth daily.  Marland Kitchen omega-3 acid ethyl esters (LOVAZA) 1 g capsule Take 2 capsules by mouth twice daily.  . potassium chloride (KLOR-CON) 10 MEQ tablet Take 1 tablet by mouth every morning.  . pravastatin (PRAVACHOL) 20 MG tablet Take 1 tablet by mouth at bedtime.     Allergies: No Known Allergies  Social History: The patient  reports that she has never smoked. She has never used smokeless tobacco. She reports that she does not drink alcohol and does not use drugs.   Family History: The patient's family history includes Heart disease in her father and mother.   Review of Systems: Please see the history of present illness.   All other systems are reviewed and negative.   Physical Exam: VS:  BP 120/76   Pulse 64   Ht 5\' 2"  (1.575 m)   Wt 152 lb 9.6 oz (69.2 kg)   SpO2 98%   BMI 27.91 kg/m  .  BMI Body mass index is 27.91 kg/m.  Wt Readings from Last 3 Encounters:  04/05/20 152 lb 9.6 oz (69.2 kg)  03/01/20 149 lb 3.2 oz (67.7 kg)  11/25/19 152 lb 1.6 oz (69 kg)    General: Elderly. Alert and in no acute distress. She is in a wheelchair.  Cardiac: Regular rate and rhythm. Soft outflow murmur. Less edema.  Respiratory:  Lungs are clear to auscultation bilaterally with normal work of breathing.  GI: Soft and nontender.  MS: No deformity or atrophy. Gait and ROM intact.  Skin: Warm and dry. Color is normal.  Neuro:  Strength and sensation are intact and no gross focal deficits noted.  Psych: Alert, appropriate and with normal affect.   LABORATORY DATA:  EKG:  EKG is ordered today.  Personally reviewed by me. This demonstrates sinus rhythm. HR is 64. She has borderline 1st degree AV block.   Lab Results  Component Value Date   WBC 5.7 03/31/2020   HGB 9.9 (L) 03/31/2020   HCT 30.8 (L) 03/31/2020   PLT 106 (L) 03/31/2020   GLUCOSE 233  (H) 03/31/2020   CHOL 109 07/07/2019   TRIG 228 (H) 07/07/2019   HDL 23 (L) 07/07/2019   LDLCALC 49 07/07/2019   ALT 12 03/31/2020   AST 25 03/31/2020   NA 139 03/31/2020   K 4.2 03/31/2020   CL 110 03/31/2020   CREATININE 2.48 (H) 03/31/2020   BUN 30 (H) 03/31/2020   CO2 19 (L) 03/31/2020   TSH 2.120 11/17/2019   INR 1.11 06/18/2014   HGBA1C 6.0 (H) 10/19/2016       BNP (last 3 results) No results for  input(s): BNP in the last 8760 hours.  ProBNP (last 3 results) No results for input(s): PROBNP in the last 8760 hours.   Other Studies Reviewed Today:  Echo Study Conclusions from September 2015  - Left ventricle: The cavity size was normal. Wall thickness was normal. Systolic function was normal. The estimated ejection fraction was in the range of 60% to 65%. Left ventricular diastolic function parameters were normal. - Mitral valve: There was mild regurgitation. - Left atrium: The atrium was mildly dilated. - Right atrium: The atrium was mildly dilated.   ASSESSMENT & PLAN:   1. History of VT - on low dose amiodarone - EKG is unstable.   2. High risk medicine - she has had labs - just needs TSH.   3. Anemia - now seeing Hematology and getting her Procrit.   4. HLD - on Pravachol and Lovaza - could consider stopping.   5. Chronic lower extremity edema - this looks better today - sounds like her diet has improved with the family input and she has been more agreeable.   6. HTN - BP fine on her current regimen. No changes made today.   7. Remote abnormal PFTs - followed only clinically. Breathing seems stable.   40. Advanced age - she is very hard of hearing - makes communication difficult.    Current medicines are reviewed with the patient today.  The patient does not have concerns regarding medicines other than what has been noted above.  The following changes have been made:  See above.  Labs/ tests ordered today include:    Orders Placed This  Encounter  Procedures  . TSH  . EKG 12-Lead     Disposition:   FU with Dr. Rayann Heman in 4 months with EKG. TSH today. They will get flu shot at the drug store. No changes made with her current regimen. Overall seems to be holding her own. They are aware that I am leaving in February.    Patient is agreeable to this plan and will call if any problems develop in the interim.   SignedTruitt Merle, NP  04/05/2020 10:22 AM  [facility] [address] [place], [place]  [address] Phone: [phone] Fax: [phone]

## 2020-03-30 ENCOUNTER — Other Ambulatory Visit: Payer: Self-pay | Admitting: *Deleted

## 2020-03-30 DIAGNOSIS — N1832 Chronic kidney disease, stage 3b: Secondary | ICD-10-CM

## 2020-03-31 ENCOUNTER — Inpatient Hospital Stay: Payer: Medicare Other

## 2020-03-31 ENCOUNTER — Other Ambulatory Visit: Payer: Self-pay | Admitting: Nurse Practitioner

## 2020-03-31 ENCOUNTER — Other Ambulatory Visit: Payer: Self-pay

## 2020-03-31 ENCOUNTER — Inpatient Hospital Stay: Payer: Medicare Other | Attending: Hematology and Oncology

## 2020-03-31 VITALS — BP 160/78 | HR 71 | Temp 98.2°F | Resp 18

## 2020-03-31 DIAGNOSIS — N189 Chronic kidney disease, unspecified: Secondary | ICD-10-CM | POA: Insufficient documentation

## 2020-03-31 DIAGNOSIS — D631 Anemia in chronic kidney disease: Secondary | ICD-10-CM

## 2020-03-31 LAB — CMP (CANCER CENTER ONLY)
ALT: 12 U/L (ref 0–44)
AST: 25 U/L (ref 15–41)
Albumin: 3.3 g/dL — ABNORMAL LOW (ref 3.5–5.0)
Alkaline Phosphatase: 165 U/L — ABNORMAL HIGH (ref 38–126)
Anion gap: 10 (ref 5–15)
BUN: 30 mg/dL — ABNORMAL HIGH (ref 8–23)
CO2: 19 mmol/L — ABNORMAL LOW (ref 22–32)
Calcium: 8.6 mg/dL — ABNORMAL LOW (ref 8.9–10.3)
Chloride: 110 mmol/L (ref 98–111)
Creatinine: 2.48 mg/dL — ABNORMAL HIGH (ref 0.44–1.00)
GFR, Estimated: 18 mL/min — ABNORMAL LOW (ref >60)
Glucose, Bld: 233 mg/dL — ABNORMAL HIGH (ref 70–99)
Potassium: 4.2 mmol/L (ref 3.5–5.1)
Sodium: 139 mmol/L (ref 135–145)
Total Bilirubin: 0.5 mg/dL (ref 0.3–1.2)
Total Protein: 7 g/dL (ref 6.5–8.1)

## 2020-03-31 LAB — CBC WITH DIFFERENTIAL (CANCER CENTER ONLY)
Abs Immature Granulocytes: 0.01 10*3/uL (ref 0.00–0.07)
Basophils Absolute: 0 10*3/uL (ref 0.0–0.1)
Basophils Relative: 1 %
Eosinophils Absolute: 0.2 10*3/uL (ref 0.0–0.5)
Eosinophils Relative: 4 %
HCT: 30.8 % — ABNORMAL LOW (ref 36.0–46.0)
Hemoglobin: 9.9 g/dL — ABNORMAL LOW (ref 12.0–15.0)
Immature Granulocytes: 0 %
Lymphocytes Relative: 31 %
Lymphs Abs: 1.8 10*3/uL (ref 0.7–4.0)
MCH: 27 pg (ref 26.0–34.0)
MCHC: 32.1 g/dL (ref 30.0–36.0)
MCV: 84.2 fL (ref 80.0–100.0)
Monocytes Absolute: 0.5 10*3/uL (ref 0.1–1.0)
Monocytes Relative: 9 %
Neutro Abs: 3.2 10*3/uL (ref 1.7–7.7)
Neutrophils Relative %: 55 %
Platelet Count: 106 10*3/uL — ABNORMAL LOW (ref 150–400)
RBC: 3.66 MIL/uL — ABNORMAL LOW (ref 3.87–5.11)
RDW: 16.2 % — ABNORMAL HIGH (ref 11.5–15.5)
WBC Count: 5.7 10*3/uL (ref 4.0–10.5)
nRBC: 0 % (ref 0.0–0.2)

## 2020-03-31 MED ORDER — EPOETIN ALFA-EPBX 40000 UNIT/ML IJ SOLN
INTRAMUSCULAR | Status: AC
  Filled 2020-03-31: qty 1

## 2020-03-31 MED ORDER — EPOETIN ALFA-EPBX 40000 UNIT/ML IJ SOLN
40000.0000 [IU] | Freq: Once | INTRAMUSCULAR | Status: AC
  Administered 2020-03-31: 40000 [IU] via SUBCUTANEOUS

## 2020-03-31 NOTE — Patient Instructions (Signed)

## 2020-04-01 ENCOUNTER — Ambulatory Visit: Payer: Medicare Other

## 2020-04-01 ENCOUNTER — Other Ambulatory Visit: Payer: Medicare Other

## 2020-04-05 ENCOUNTER — Ambulatory Visit (INDEPENDENT_AMBULATORY_CARE_PROVIDER_SITE_OTHER): Payer: Medicare Other | Admitting: Nurse Practitioner

## 2020-04-05 ENCOUNTER — Encounter: Payer: Self-pay | Admitting: Nurse Practitioner

## 2020-04-05 ENCOUNTER — Other Ambulatory Visit: Payer: Self-pay

## 2020-04-05 VITALS — BP 120/76 | HR 64 | Ht 62.0 in | Wt 152.6 lb

## 2020-04-05 DIAGNOSIS — R001 Bradycardia, unspecified: Secondary | ICD-10-CM | POA: Diagnosis not present

## 2020-04-05 DIAGNOSIS — D649 Anemia, unspecified: Secondary | ICD-10-CM

## 2020-04-05 DIAGNOSIS — I519 Heart disease, unspecified: Secondary | ICD-10-CM

## 2020-04-05 DIAGNOSIS — I10 Essential (primary) hypertension: Secondary | ICD-10-CM

## 2020-04-05 DIAGNOSIS — Z79899 Other long term (current) drug therapy: Secondary | ICD-10-CM

## 2020-04-05 DIAGNOSIS — E7800 Pure hypercholesterolemia, unspecified: Secondary | ICD-10-CM | POA: Diagnosis not present

## 2020-04-05 DIAGNOSIS — I472 Ventricular tachycardia, unspecified: Secondary | ICD-10-CM

## 2020-04-05 LAB — TSH: TSH: 2.77 u[IU]/mL (ref 0.450–4.500)

## 2020-04-05 NOTE — Patient Instructions (Addendum)
After Visit Summary:  We will be checking the following labs today - TSH   Medication Instructions:    Continue with your current medicines.    If you need a refill on your cardiac medications before your next appointment, please call your pharmacy.     Testing/Procedures To Be Arranged:  N/A  Follow-Up:   See Dr. Rayann Heman in 4 months with EKG    At [facility], you and your health needs are our priority.  As part of our continuing mission to provide you with exceptional heart care, we have created designated Provider Care Teams.  These Care Teams include your primary Cardiologist (physician) and Advanced Practice Providers (APPs -  Physician Assistants and Nurse Practitioners) who all work together to provide you with the care you need, when you need it.  Special Instructions:  . Stay safe, wash your hands for at least 20 seconds and wear a mask when needed.  . It was good to talk with you today.     Call the [place] office at [phone] if you have any questions, problems or concerns.

## 2020-04-29 ENCOUNTER — Emergency Department (HOSPITAL_COMMUNITY)
Admission: EM | Admit: 2020-04-29 | Discharge: 2020-04-30 | Disposition: A | Discharge status: Home / Self Care | Payer: Medicare Other | Attending: Emergency Medicine | Admitting: Emergency Medicine

## 2020-04-29 ENCOUNTER — Encounter (HOSPITAL_COMMUNITY): Payer: Self-pay

## 2020-04-29 ENCOUNTER — Other Ambulatory Visit: Payer: Self-pay

## 2020-04-29 ENCOUNTER — Emergency Department (HOSPITAL_COMMUNITY): Payer: Medicare Other

## 2020-04-29 DIAGNOSIS — E039 Hypothyroidism, unspecified: Secondary | ICD-10-CM | POA: Insufficient documentation

## 2020-04-29 DIAGNOSIS — I129 Hypertensive chronic kidney disease with stage 1 through stage 4 chronic kidney disease, or unspecified chronic kidney disease: Secondary | ICD-10-CM | POA: Diagnosis not present

## 2020-04-29 DIAGNOSIS — Z20822 Contact with and (suspected) exposure to covid-19: Secondary | ICD-10-CM | POA: Diagnosis not present

## 2020-04-29 DIAGNOSIS — I10 Essential (primary) hypertension: Secondary | ICD-10-CM | POA: Diagnosis not present

## 2020-04-29 DIAGNOSIS — I700 Atherosclerosis of aorta: Secondary | ICD-10-CM | POA: Diagnosis not present

## 2020-04-29 DIAGNOSIS — Z79899 Other long term (current) drug therapy: Secondary | ICD-10-CM | POA: Insufficient documentation

## 2020-04-29 DIAGNOSIS — M79641 Pain in right hand: Secondary | ICD-10-CM | POA: Insufficient documentation

## 2020-04-29 DIAGNOSIS — M19041 Primary osteoarthritis, right hand: Secondary | ICD-10-CM | POA: Diagnosis not present

## 2020-04-29 DIAGNOSIS — M19042 Primary osteoarthritis, left hand: Secondary | ICD-10-CM | POA: Diagnosis not present

## 2020-04-29 DIAGNOSIS — N3000 Acute cystitis without hematuria: Secondary | ICD-10-CM | POA: Diagnosis not present

## 2020-04-29 DIAGNOSIS — M19032 Primary osteoarthritis, left wrist: Secondary | ICD-10-CM | POA: Diagnosis not present

## 2020-04-29 DIAGNOSIS — M47814 Spondylosis without myelopathy or radiculopathy, thoracic region: Secondary | ICD-10-CM | POA: Diagnosis not present

## 2020-04-29 DIAGNOSIS — E1165 Type 2 diabetes mellitus with hyperglycemia: Secondary | ICD-10-CM | POA: Diagnosis not present

## 2020-04-29 DIAGNOSIS — E1122 Type 2 diabetes mellitus with diabetic chronic kidney disease: Secondary | ICD-10-CM | POA: Diagnosis not present

## 2020-04-29 DIAGNOSIS — Z7982 Long term (current) use of aspirin: Secondary | ICD-10-CM | POA: Insufficient documentation

## 2020-04-29 DIAGNOSIS — N184 Chronic kidney disease, stage 4 (severe): Secondary | ICD-10-CM | POA: Diagnosis not present

## 2020-04-29 DIAGNOSIS — Z7984 Long term (current) use of oral hypoglycemic drugs: Secondary | ICD-10-CM | POA: Insufficient documentation

## 2020-04-29 DIAGNOSIS — G9389 Other specified disorders of brain: Secondary | ICD-10-CM | POA: Diagnosis not present

## 2020-04-29 DIAGNOSIS — G319 Degenerative disease of nervous system, unspecified: Secondary | ICD-10-CM | POA: Diagnosis not present

## 2020-04-29 DIAGNOSIS — R404 Transient alteration of awareness: Secondary | ICD-10-CM | POA: Diagnosis not present

## 2020-04-29 DIAGNOSIS — R52 Pain, unspecified: Secondary | ICD-10-CM | POA: Diagnosis not present

## 2020-04-29 DIAGNOSIS — R531 Weakness: Secondary | ICD-10-CM | POA: Diagnosis not present

## 2020-04-29 DIAGNOSIS — Z043 Encounter for examination and observation following other accident: Secondary | ICD-10-CM | POA: Diagnosis not present

## 2020-04-29 LAB — COMPREHENSIVE METABOLIC PANEL
ALT: 15 U/L (ref 0–44)
AST: 24 U/L (ref 15–41)
Albumin: 2.9 g/dL — ABNORMAL LOW (ref 3.5–5.0)
Alkaline Phosphatase: 172 U/L — ABNORMAL HIGH (ref 38–126)
Anion gap: 11 (ref 5–15)
BUN: 23 mg/dL (ref 8–23)
CO2: 16 mmol/L — ABNORMAL LOW (ref 22–32)
Calcium: 8.7 mg/dL — ABNORMAL LOW (ref 8.9–10.3)
Chloride: 110 mmol/L (ref 98–111)
Creatinine, Ser: 2.28 mg/dL — ABNORMAL HIGH (ref 0.44–1.00)
GFR, Estimated: 20 mL/min — ABNORMAL LOW (ref >60)
Glucose, Bld: 322 mg/dL — ABNORMAL HIGH (ref 70–99)
Potassium: 4.5 mmol/L (ref 3.5–5.1)
Sodium: 137 mmol/L (ref 135–145)
Total Bilirubin: 0.5 mg/dL (ref 0.3–1.2)
Total Protein: 6 g/dL — ABNORMAL LOW (ref 6.5–8.1)

## 2020-04-29 LAB — CBC WITH DIFFERENTIAL/PLATELET
Abs Immature Granulocytes: 0.02 10*3/uL (ref 0.00–0.07)
Basophils Absolute: 0.1 10*3/uL (ref 0.0–0.1)
Basophils Relative: 1 %
Eosinophils Absolute: 0.4 10*3/uL (ref 0.0–0.5)
Eosinophils Relative: 7 %
HCT: 30.6 % — ABNORMAL LOW (ref 36.0–46.0)
Hemoglobin: 9.5 g/dL — ABNORMAL LOW (ref 12.0–15.0)
Immature Granulocytes: 0 %
Lymphocytes Relative: 24 %
Lymphs Abs: 1.2 10*3/uL (ref 0.7–4.0)
MCH: 26.9 pg (ref 26.0–34.0)
MCHC: 31 g/dL (ref 30.0–36.0)
MCV: 86.7 fL (ref 80.0–100.0)
Monocytes Absolute: 0.4 10*3/uL (ref 0.1–1.0)
Monocytes Relative: 7 %
Neutro Abs: 3.2 10*3/uL (ref 1.7–7.7)
Neutrophils Relative %: 61 %
Platelets: 100 10*3/uL — ABNORMAL LOW (ref 150–400)
RBC: 3.53 MIL/uL — ABNORMAL LOW (ref 3.87–5.11)
RDW: 17.5 % — ABNORMAL HIGH (ref 11.5–15.5)
WBC: 5.2 10*3/uL (ref 4.0–10.5)
nRBC: 0 % (ref 0.0–0.2)

## 2020-04-29 LAB — RESP PANEL BY RT-PCR (FLU A&B, COVID) ARPGX2
Influenza A by PCR: NEGATIVE
Influenza B by PCR: NEGATIVE
SARS Coronavirus 2 by RT PCR: NEGATIVE

## 2020-04-29 LAB — URINALYSIS, ROUTINE W REFLEX MICROSCOPIC
Bilirubin Urine: NEGATIVE
Glucose, UA: >=500 mg/dL — AB
Ketones, ur: NEGATIVE mg/dL
Nitrite: NEGATIVE
Protein, ur: >300 mg/dL — AB
Specific Gravity, Urine: 1.025 (ref 1.005–1.030)
pH: 5.5 (ref 5.0–8.0)

## 2020-04-29 LAB — CBG MONITORING, ED: Glucose-Capillary: 298 mg/dL — ABNORMAL HIGH (ref 70–99)

## 2020-04-29 LAB — URINALYSIS, MICROSCOPIC (REFLEX)

## 2020-04-29 LAB — TROPONIN I (HIGH SENSITIVITY): Troponin I (High Sensitivity): 8 ng/L (ref <18)

## 2020-04-29 MED ORDER — CEPHALEXIN 500 MG PO CAPS: 500.0000 mg | ORAL_CAPSULE | Freq: Four times a day (QID) | ORAL | 0 refills | Status: AC

## 2020-04-29 MED ORDER — CEPHALEXIN 250 MG PO CAPS
500.0000 mg | ORAL_CAPSULE | Freq: Once | ORAL | Status: AC
  Administered 2020-04-30: 500 mg via ORAL
  Filled 2020-04-29: qty 2

## 2020-04-29 MED ORDER — CEPHALEXIN 500 MG PO CAPS: 500.0000 mg | ORAL_CAPSULE | Freq: Four times a day (QID) | ORAL | 0 refills | Status: DC

## 2020-04-29 NOTE — Discharge Instructions (Signed)
As we discussed, your work-up today showed evidence of urinary tract infection.  We will plan to treat this with antibiotics.  Take antibiotics as directed. Please take all of your antibiotics until finished.  Follow-up with your primary care doctor.  Return the emergency department for any fever, vomiting, abdominal pain, chest pain, difficulty breathing or any other worsening concerning symptoms.

## 2020-04-29 NOTE — ED Provider Notes (Signed)
[facility] EMERGENCY DEPARTMENT Provider Note   CSN: [id number] Arrival date & time: 04/29/20  1659     History Chief Complaint  Patient presents with  . Weakness    Leslie Coleman is a 86 y.o. female with PMH/o anemia (treated with procrit), HTN, DM, HLD, Vtach who presents for evaluation of generalized weakness and recent fall. Son states that about a week ago patient had a fall. He is unclear of the exact details of the fall but noted that patient then had some bruising on her bilateral hands and was complaining of some pain noted to the right hand. Son reports that patient has been more weak over the last few days and not had any energy. Patients she is just tired. She is only complaining of right hand pain. She denies any CP, Sob, difficulty breathing, leg swelling.   The history is provided by the patient.       Past Medical History:  Diagnosis Date  . Abnormal PFTs (pulmonary function tests) 2008   No obstruction noted. Has been followed clinically.  . Anemia    treated with Procrit  . Fall from slip, trip, or stumble 05/07/2016   [place] resulting in right tibial plateau fracture  . Gout   . High risk medication use    on amiodarone.  Marland Kitchen HTN (hypertension)    Last echo in 2004; EF normal  . Hyperlipidemia   . Hypothyroidism    on replacement  . Irregular heart beat "since 1985"  . Obesity   . Type II diabetes mellitus ([place])   . V-tach [facility])    remote VT, treated initially with procainamide, on amiodarone since 2008    Patient Active Problem List   Diagnosis Date Noted  . Acute kidney injury ([place]) 10/19/2016  . Dizziness 10/19/2016  . CKD (chronic kidney disease) stage 4, GFR 15-29 ml/min (HCC) 10/19/2016  . Hypothyroidism, adult 10/19/2016  . Abnormal urinalysis 10/19/2016  . Diabetes mellitus type 2, uncontrolled ([place]) 10/19/2016  . Anemia due to chronic kidney disease 10/19/2016  . Thrombocytopenia ([place]) 10/19/2016  . Candidal  dermatitis 10/19/2016  . Stress incontinence 10/19/2016  . V-tach ([place]) 10/19/2016  . Pressure injury of skin 10/19/2016  . Diabetes mellitus with complication ([place])   . Urinary tract infection without hematuria   . CKD (chronic kidney disease), stage III ([place]) 05/20/2016  . Hypothyroidism 05/20/2016  . Closed fracture of right tibial plateau 05/12/2016  . Anemia in chronic kidney disease 12/16/2015  . Dizzy 07/07/2011  . Dehydration 07/07/2011  . Type II diabetes mellitus ([place]) 07/06/2011  . History of long-term treatment with high-risk medication 08/29/2010  . Abnormal PFTs (pulmonary function tests)   . History of ventricular tachycardia   . HTN (hypertension)   . Hyperlipidemia   . Gout     Past Surgical History:  Procedure Laterality Date  . CARDIOVASCULAR STRESS TEST  07/07/2003   EF 70%. NO EVIDENCE OF ISCHEMIA  . CARDIOVERSION  1978  . CATARACT EXTRACTION, BILATERAL Bilateral   . CATARACT EXTRACTION, BILATERAL Bilateral   . CESAREAN SECTION  1957; 1961; 1967  . FRACTURE SURGERY    . ORIF TIBIA PLATEAU Right 05/10/2016   Procedure: OPEN REDUCTION INTERNAL FIXATION (ORIF) RIGHT TIBIAL PLATEAU  WITH BONE GRAFT;  Surgeon: Leandrew Koyanagi, MD;  Location: [place];  Service: Orthopedics;  Laterality: Right;  . US ECHOCARDIOGRAPHY  12/29/2002   EF 60-65%  . VAGINAL HYSTERECTOMY  OB History   No obstetric history on file.     Family History  Problem Relation Age of Onset  . Heart disease Mother   . Heart disease Father     Social History   Tobacco Use  . Smoking status: Never Smoker  . Smokeless tobacco: Never Used  Vaping Use  . Vaping Use: Never used  Substance Use Topics  . Alcohol use: No  . Drug use: No    Home Medications Prior to Admission medications   Medication Sig Start Date End Date Taking? Authorizing Provider  amiodarone (PACERONE) 100 MG tablet Take 1 tablet by mouth every morning. 06/05/19   Burtis Junes, NP  amLODipine (NORVASC) 5 MG  tablet Take 1 tablet by mouth daily. 03/31/20   Burtis Junes, NP  ASPIRIN LOW DOSE 81 MG EC tablet Take 1 tablet by mouth daily. 03/31/20   Burtis Junes, NP  Epoetin Alfa (PROCRIT IJ) Inject 10,000 Units as directed every 28 (twenty-eight) days.     [provider]  furosemide (LASIX) 20 MG tablet Take 20 mg by mouth daily.    [provider]  glipiZIDE (GLUCOTROL XL) 5 MG 24 hr tablet Take 1 tablet by mouth daily. 10/03/19   Burtis Junes, NP  hydrALAZINE (APRESOLINE) 50 MG tablet Take 1 tablet by mouth twice daily. 10/03/19   Burtis Junes, NP  levothyroxine (SYNTHROID) 100 MCG tablet Take 1 tablet by mouth daily before breakfast. 12/02/19   Burtis Junes, NP  Melatonin 5 MG CAPS Take 5 capsules by mouth at bedtime.    [provider]  Multiple Vitamin (MULTIVITAMIN WITH MINERALS) TABS tablet Take 1 tablet by mouth daily.    [provider]  omega-3 acid ethyl esters (LOVAZA) 1 g capsule Take 2 capsules by mouth twice daily. 09/04/19   Burtis Junes, NP  potassium chloride (KLOR-CON) 10 MEQ tablet Take 1 tablet by mouth every morning. 10/03/19   Burtis Junes, NP  pravastatin (PRAVACHOL) 20 MG tablet Take 1 tablet by mouth at bedtime. 10/03/19   Burtis Junes, NP    Allergies    Patient has no known allergies.  Review of Systems   Review of Systems  Constitutional: Negative for fever.  Respiratory: Negative for cough and shortness of breath.   Cardiovascular: Negative for chest pain.  Gastrointestinal: Negative for abdominal pain, nausea and vomiting.  Musculoskeletal:       Hand pain  Neurological: Positive for weakness (generalized). Negative for headaches.  All other systems reviewed and are negative.   Physical Exam Updated Vital Signs BP (!) 169/58   Pulse 75   Temp (!) 97.5 F (36.4 C) (Oral)   Resp 18   Ht 5\' 2"  (1.575 m)   Wt 69.2 kg   SpO2 98%   BMI 27.90 kg/m   Physical Exam Vitals and nursing note  reviewed.  Constitutional:      Appearance: Normal appearance. She is well-developed and well-nourished.  HENT:     Head: Normocephalic and atraumatic.     Comments: No tenderness to palpation of skull. No deformities or crepitus noted. No open wounds, abrasions or lacerations.     Mouth/Throat:     Mouth: Oropharynx is clear and moist and mucous membranes are normal.  Eyes:     General: Lids are normal.     Extraocular Movements: EOM normal.     Conjunctiva/sclera: Conjunctivae normal.     Pupils: Pupils are equal, round, and  reactive to light.     Comments: PERRL. EOMs intact. No nystagmus. No neglect.   Cardiovascular:     Rate and Rhythm: Normal rate and regular rhythm.     Pulses: Normal pulses.          Radial pulses are 2+ on the right side and 2+ on the left side.       Dorsalis pedis pulses are 2+ on the right side and 2+ on the left side.     Heart sounds: Normal heart sounds. No murmur heard. No friction rub. No gallop.   Pulmonary:     Effort: Pulmonary effort is normal.     Breath sounds: Normal breath sounds.     Comments: Lungs clear to auscultation bilaterally.  Symmetric chest rise.  No wheezing, rales, rhonchi. Abdominal:     Palpations: Abdomen is soft. Abdomen is not rigid.     Tenderness: There is no abdominal tenderness. There is no guarding.     Comments: Abdomen is soft, non-distended, non-tender. No rigidity, No guarding. No peritoneal signs.  Musculoskeletal:        General: Normal range of motion.     Cervical back: Full passive range of motion without pain.     Comments: Tenderness palpation dorsal aspect of the right hand with some mild overlying bruising.  No tenderness palpation into the wrist, forearm, elbow, shoulder.  Tenderness palpation noted to the left dorsum hand as well.  There is some overlying soft tissue swelling.  No bony tenderness into the left wrist, left forearm, left shoulder.  No pelvic instability. No tenderness to palpation to  bilateral knees and ankles. No deformities or crepitus noted. FROM of BLE without any difficulty.   Skin:    General: Skin is warm and dry.     Capillary Refill: Capillary refill takes less than 2 seconds.     Comments: Scattered bruises  Neurological:     Mental Status: She is alert and oriented to person, place, and time.     Comments: Cranial nerves III-XII intact Follows commands, Moves all extremities  5/5 strength to BUE and BLE  Sensation intact throughout all major nerve distributions No slurred speech. No facial droop.   Psychiatric:        Mood and Affect: Mood and affect normal.        Speech: Speech normal.     ED Results / Procedures / Treatments   Labs (all labs ordered are listed, but only abnormal results are displayed) Labs Reviewed  URINALYSIS, ROUTINE W REFLEX MICROSCOPIC - Abnormal; Notable for the following components:      Result Value   APPearance CLOUDY (*)    Glucose, UA >=500 (*)    Hgb urine dipstick LARGE (*)    Protein, ur >300 (*)    Leukocytes,Ua TRACE (*)    All other components within normal limits  COMPREHENSIVE METABOLIC PANEL - Abnormal; Notable for the following components:   CO2 16 (*)    Glucose, Bld 322 (*)    Creatinine, Ser 2.28 (*)    Calcium 8.7 (*)    Total Protein 6.0 (*)    Albumin 2.9 (*)    Alkaline Phosphatase 172 (*)    GFR, Estimated 20 (*)    All other components within normal limits  CBC WITH DIFFERENTIAL/PLATELET - Abnormal; Notable for the following components:   RBC 3.53 (*)    Hemoglobin 9.5 (*)    HCT 30.6 (*)    RDW 17.5 (*)  Platelets 100 (*)    All other components within normal limits  URINALYSIS, MICROSCOPIC (REFLEX) - Abnormal; Notable for the following components:   Bacteria, UA MANY (*)    All other components within normal limits  CBG MONITORING, ED - Abnormal; Notable for the following components:   Glucose-Capillary 298 (*)    All other components within normal limits  RESP PANEL BY RT-PCR  (FLU A&B, COVID) ARPGX2  TROPONIN I (HIGH SENSITIVITY)  TROPONIN I (HIGH SENSITIVITY)    EKG EKG Interpretation  Date/Time:  Thursday April 29 2020 19:11:24 EST Ventricular Rate:  65 PR Interval:    QRS Duration: 108 QT Interval:  455 QTC Calculation: 474 R Axis:   41 Text Interpretation: Sinus rhythm Atrial premature complex Borderline prolonged PR interval ECG OTHERWISE WITHIN NORMAL LIMITS Confirmed by Noemi Chapel [phone]) on 04/29/2020 7:17:17 PM   Radiology CT Head Wo Contrast  Result Date: 04/29/2020 CLINICAL DATA:  Progressive weakness. EXAM: CT HEAD WITHOUT CONTRAST TECHNIQUE: Contiguous axial images were obtained from the base of the skull through the vertex without intravenous contrast. COMPARISON:  Sep 29, 2015 FINDINGS: Brain: There is mild to moderate severity cerebral atrophy with widening of the extra-axial spaces and ventricular dilatation. There are areas of decreased attenuation within the white matter tracts of the supratentorial brain, consistent with microvascular disease changes. Vascular: Areas of atherosclerotic calcification are again seen Skull: Negative for fracture or focal lesion. Hyperostosis is again seen. Sinuses/Orbits: No acute finding. Other: None. IMPRESSION: 1. Generalized cerebral atrophy. 2. No acute intracranial abnormality. Electronically Signed   By: Virgina Norfolk M.D.   On: 04/29/2020 18:54   DG Chest Portable 1 View  Result Date: 04/29/2020 CLINICAL DATA:  Status post fall. EXAM: PORTABLE CHEST 1 VIEW COMPARISON:  Oct 19, 2016 FINDINGS: The heart size and mediastinal contours are within normal limits. There is moderate severity calcification of the thoracic aorta. Both lungs are clear. Degenerative changes seen throughout the thoracic spine. IMPRESSION: 1. No active disease. Electronically Signed   By: Virgina Norfolk M.D.   On: 04/29/2020 18:41   DG Hand Complete Left  Result Date: 04/29/2020 CLINICAL DATA:  Status post fall. EXAM:  LEFT HAND - COMPLETE 3+ VIEW COMPARISON:  None. FINDINGS: A radiopaque watch is seen overlying the distal left forearm, with a radiopaque ring overlying the proximal phalanx of the fourth left finger. These areas are subsequently limited evaluation. There is no evidence of an acute fracture or dislocation. Moderate severity degenerative changes are seen throughout the left wrist and along the carpometacarpal articulation of the left thumb. Additional degenerative changes seen throughout the interphalangeal joints of the left hand. Soft tissues are unremarkable. IMPRESSION: 1. No evidence of an acute fracture or dislocation. 2. Moderate severity degenerative changes, as described above. Electronically Signed   By: Virgina Norfolk M.D.   On: 04/29/2020 18:40   DG Hand Complete Right  Result Date: 04/29/2020 CLINICAL DATA:  Fall EXAM: RIGHT HAND - COMPLETE 3+ VIEW COMPARISON:  None. FINDINGS: Physiologic alignment with approximation of the joints. Diffuse joint space loss. Degenerative spurring, small geode formation and subchondral sclerosis predominantly involving the first [facility], first IP and 2/4th IP joints. Osteopenia. No fracture. Prominence of the soft tissues. Vascular calcifications. IMPRESSION: 1. No acute fracture or dislocation.  Osteopenia. 2. Mild-to-moderate osteoarthrosis. Electronically Signed   By: Primitivo Gauze M.D.   On: 04/29/2020 18:37    Procedures Procedures (including critical care time)  Medications Ordered in ED Medications  cephALEXin (KEFLEX) capsule  500 mg (has no administration in time range)    ED Course  I have reviewed the triage vital signs and the nursing notes.  Pertinent labs & imaging results that were available during my care of the patient were reviewed by me and considered in my medical decision making (see chart for details).    MDM Rules/Calculators/A&P                          86 y.o. F with PMH/o who presents for evaluation of pain to her hands  as well as some generalized weakness.  Patient states she only has pain to her right hand after a fall that occurred about a week ago.  She states nothing else is bothering her.  Son is at bedside who states that he is concerned that patient has been little bit weaker than normal.  He states that she has also been tired.  No vomiting but she had an episode where she urinated on the bed so he wanted to get her checked out.  On initial arrival, she is afebrile, nontoxic-appearing.  She is slightly hypertensive.  Vitals otherwise stable.  On exam, she is alert and oriented and is able to follow commands.  She does have some bruises noted to bilateral hands and some tenderness.  We will plan for x-rays to evaluate for any potential fractures.  Additionally, given her history of V. tach as well as generalized weakness, will plan for labs.  Covid is negative.  Glucose is two ninety-eight.  CBC shows no leukocytosis.  Hemoglobin is 9.5.  This is her baseline hemoglobin.  CMP shows glucose of three twenty-two, BUN of twenty-three, creatinine 2.28. This is her baseline. Trop is negative.  Urine shows evidence of hemoglobin, leukocytes, pyuria, bacteria.  She does have urinary frequency.  We will plan to treat.  Work-up is not consistent with DKA.  X-ray of hand both left and right are negative for any acute fracture. Chest x-ray negative.  CT head negative for any acute abnormalities  At this time, patient is at her baseline.  She is hemodynamically stable.  I did discuss with son regarding home health services.  He states he already has that set up for his dad and he will have them evaluate his mom.  At this time, I feel that patient is appropriate for discharge with outpatient antibiotics. At this time, patient exhibits no emergent life-threatening condition that require further evaluation in ED. Patient had ample opportunity for questions and discussion. All patient's questions were answered with full understanding.  Strict return precautions discussed. Patient expresses understanding and agreement to plan.   Portions of this note were generated with [employer]. Dictation errors may occur despite best attempts at proofreading.  Final Clinical Impression(s) / ED Diagnoses Final diagnoses:  Acute cystitis without hematuria    Rx / DC Orders ED Discharge Orders    None       Desma Mcgregor 04/30/20 1321    Noemi Chapel, MD 04/30/20 1455

## 2020-04-29 NOTE — ED Notes (Incomplete)
First trop collected at [place] collected

## 2020-04-29 NOTE — ED Provider Notes (Signed)
This patient is an 86 year old female, she presents with some urinary incontinence in the bed for the last couple of days, she reports that she has no pain, no shortness of breath, on my exam she has no abdominal tenderness, no tachycardia, no shortness of breath, and has laboratory work-up which shows hyperglycemia without DKA. Imaging has been performed showing no significant abnormalities, pending urinalysis to make sure there is no UTI though I suspect there is a significant amount of diuresis due to hyperglycemia.   Noemi Chapel, MD 04/29/20 2141

## 2020-04-29 NOTE — ED Triage Notes (Signed)
Pt BIB [facility] from home with Weakness.   Pt has been having progressing weakness for 3-4 days. Caretaker at home states shes been not ambulating well and been "shaky" recently. Pt has visible tremors to arms when extended.  Pt hypertensive at 518A systolic. All other VSS.  EMS CBG 346 with no Hx of DM.  Pt A&Ox4, GCS 15  No Hx of Blood Thinners (Pt did sustain a fall approx. 2 weeks ago, bruising noted on both hands).

## 2020-04-30 ENCOUNTER — Other Ambulatory Visit: Payer: Medicare Other

## 2020-04-30 ENCOUNTER — Ambulatory Visit: Payer: Medicare Other

## 2020-04-30 ENCOUNTER — Other Ambulatory Visit: Payer: Self-pay

## 2020-04-30 DIAGNOSIS — D631 Anemia in chronic kidney disease: Secondary | ICD-10-CM

## 2020-04-30 DIAGNOSIS — N3000 Acute cystitis without hematuria: Secondary | ICD-10-CM | POA: Diagnosis not present

## 2020-04-30 LAB — TROPONIN I (HIGH SENSITIVITY): Troponin I (High Sensitivity): 8 ng/L (ref <18)

## 2020-05-01 DIAGNOSIS — B351 Tinea unguium: Secondary | ICD-10-CM | POA: Diagnosis not present

## 2020-05-01 DIAGNOSIS — Z0001 Encounter for general adult medical examination with abnormal findings: Secondary | ICD-10-CM | POA: Diagnosis not present

## 2020-05-01 DIAGNOSIS — Z8679 Personal history of other diseases of the circulatory system: Secondary | ICD-10-CM | POA: Diagnosis not present

## 2020-05-01 DIAGNOSIS — N390 Urinary tract infection, site not specified: Secondary | ICD-10-CM | POA: Diagnosis not present

## 2020-05-01 DIAGNOSIS — Z7902 Long term (current) use of antithrombotics/antiplatelets: Secondary | ICD-10-CM | POA: Diagnosis not present

## 2020-05-01 DIAGNOSIS — I10 Essential (primary) hypertension: Secondary | ICD-10-CM | POA: Diagnosis not present

## 2020-05-01 DIAGNOSIS — Z7189 Other specified counseling: Secondary | ICD-10-CM | POA: Diagnosis not present

## 2020-05-01 DIAGNOSIS — D631 Anemia in chronic kidney disease: Secondary | ICD-10-CM | POA: Diagnosis not present

## 2020-05-01 DIAGNOSIS — E1165 Type 2 diabetes mellitus with hyperglycemia: Secondary | ICD-10-CM | POA: Diagnosis not present

## 2020-05-01 DIAGNOSIS — E785 Hyperlipidemia, unspecified: Secondary | ICD-10-CM | POA: Diagnosis not present

## 2020-05-01 DIAGNOSIS — E039 Hypothyroidism, unspecified: Secondary | ICD-10-CM | POA: Diagnosis not present

## 2020-05-03 ENCOUNTER — Other Ambulatory Visit: Payer: Self-pay | Admitting: Nurse Practitioner

## 2020-05-03 ENCOUNTER — Other Ambulatory Visit: Payer: Medicare Other

## 2020-05-03 ENCOUNTER — Ambulatory Visit: Payer: Medicare Other

## 2020-05-04 ENCOUNTER — Other Ambulatory Visit: Payer: Self-pay

## 2020-05-04 ENCOUNTER — Encounter (HOSPITAL_COMMUNITY): Payer: Self-pay

## 2020-05-04 ENCOUNTER — Emergency Department (HOSPITAL_COMMUNITY)
Admission: EM | Admit: 2020-05-04 | Discharge: 2020-05-04 | Disposition: A | Discharge status: Home / Self Care | Payer: Medicare Other | Attending: Emergency Medicine | Admitting: Emergency Medicine

## 2020-05-04 ENCOUNTER — Emergency Department (HOSPITAL_COMMUNITY): Payer: Medicare Other

## 2020-05-04 DIAGNOSIS — E119 Type 2 diabetes mellitus without complications: Secondary | ICD-10-CM | POA: Diagnosis not present

## 2020-05-04 DIAGNOSIS — Z794 Long term (current) use of insulin: Secondary | ICD-10-CM | POA: Insufficient documentation

## 2020-05-04 DIAGNOSIS — I129 Hypertensive chronic kidney disease with stage 1 through stage 4 chronic kidney disease, or unspecified chronic kidney disease: Secondary | ICD-10-CM | POA: Diagnosis not present

## 2020-05-04 DIAGNOSIS — R059 Cough, unspecified: Secondary | ICD-10-CM | POA: Diagnosis not present

## 2020-05-04 DIAGNOSIS — U071 COVID-19: Secondary | ICD-10-CM | POA: Diagnosis not present

## 2020-05-04 DIAGNOSIS — Z23 Encounter for immunization: Secondary | ICD-10-CM | POA: Insufficient documentation

## 2020-05-04 DIAGNOSIS — Z7982 Long term (current) use of aspirin: Secondary | ICD-10-CM | POA: Diagnosis not present

## 2020-05-04 DIAGNOSIS — R410 Disorientation, unspecified: Secondary | ICD-10-CM | POA: Diagnosis not present

## 2020-05-04 DIAGNOSIS — E039 Hypothyroidism, unspecified: Secondary | ICD-10-CM | POA: Diagnosis not present

## 2020-05-04 DIAGNOSIS — R531 Weakness: Secondary | ICD-10-CM | POA: Diagnosis not present

## 2020-05-04 DIAGNOSIS — N184 Chronic kidney disease, stage 4 (severe): Secondary | ICD-10-CM | POA: Diagnosis not present

## 2020-05-04 DIAGNOSIS — Z79899 Other long term (current) drug therapy: Secondary | ICD-10-CM | POA: Diagnosis not present

## 2020-05-04 DIAGNOSIS — R404 Transient alteration of awareness: Secondary | ICD-10-CM | POA: Diagnosis not present

## 2020-05-04 DIAGNOSIS — N390 Urinary tract infection, site not specified: Secondary | ICD-10-CM | POA: Diagnosis not present

## 2020-05-04 DIAGNOSIS — R0689 Other abnormalities of breathing: Secondary | ICD-10-CM | POA: Diagnosis not present

## 2020-05-04 DIAGNOSIS — I10 Essential (primary) hypertension: Secondary | ICD-10-CM | POA: Diagnosis not present

## 2020-05-04 LAB — COMPREHENSIVE METABOLIC PANEL WITH GFR
ALT: 14 U/L (ref 0–44)
AST: 27 U/L (ref 15–41)
Albumin: 3 g/dL — ABNORMAL LOW (ref 3.5–5.0)
Alkaline Phosphatase: 157 U/L — ABNORMAL HIGH (ref 38–126)
Anion gap: 9 (ref 5–15)
BUN: 26 mg/dL — ABNORMAL HIGH (ref 8–23)
CO2: 19 mmol/L — ABNORMAL LOW (ref 22–32)
Calcium: 8.4 mg/dL — ABNORMAL LOW (ref 8.9–10.3)
Chloride: 110 mmol/L (ref 98–111)
Creatinine, Ser: 2.3 mg/dL — ABNORMAL HIGH (ref 0.44–1.00)
GFR, Estimated: 20 mL/min — ABNORMAL LOW
Glucose, Bld: 59 mg/dL — ABNORMAL LOW (ref 70–99)
Potassium: 4.5 mmol/L (ref 3.5–5.1)
Sodium: 138 mmol/L (ref 135–145)
Total Bilirubin: 0.5 mg/dL (ref 0.3–1.2)
Total Protein: 6.2 g/dL — ABNORMAL LOW (ref 6.5–8.1)

## 2020-05-04 LAB — D-DIMER, QUANTITATIVE: D-Dimer, Quant: 3.25 ug/mL-FEU — ABNORMAL HIGH (ref 0.00–0.50)

## 2020-05-04 LAB — LACTATE DEHYDROGENASE: LDH: 205 U/L — ABNORMAL HIGH (ref 98–192)

## 2020-05-04 LAB — CBC WITH DIFFERENTIAL/PLATELET
Abs Immature Granulocytes: 0.02 10*3/uL (ref 0.00–0.07)
Basophils Absolute: 0 10*3/uL (ref 0.0–0.1)
Basophils Relative: 0 %
Eosinophils Absolute: 0.2 10*3/uL (ref 0.0–0.5)
Eosinophils Relative: 4 %
HCT: 27.7 % — ABNORMAL LOW (ref 36.0–46.0)
Hemoglobin: 9.1 g/dL — ABNORMAL LOW (ref 12.0–15.0)
Immature Granulocytes: 0 %
Lymphocytes Relative: 17 %
Lymphs Abs: 0.9 10*3/uL (ref 0.7–4.0)
MCH: 28.4 pg (ref 26.0–34.0)
MCHC: 32.9 g/dL (ref 30.0–36.0)
MCV: 86.6 fL (ref 80.0–100.0)
Monocytes Absolute: 0.6 10*3/uL (ref 0.1–1.0)
Monocytes Relative: 12 %
Neutro Abs: 3.5 10*3/uL (ref 1.7–7.7)
Neutrophils Relative %: 67 %
Platelets: 73 10*3/uL — ABNORMAL LOW (ref 150–400)
RBC: 3.2 MIL/uL — ABNORMAL LOW (ref 3.87–5.11)
RDW: 17.2 % — ABNORMAL HIGH (ref 11.5–15.5)
WBC: 5.2 10*3/uL (ref 4.0–10.5)
nRBC: 0 % (ref 0.0–0.2)

## 2020-05-04 LAB — FIBRINOGEN: Fibrinogen: 544 mg/dL — ABNORMAL HIGH (ref 210–475)

## 2020-05-04 LAB — PROCALCITONIN: Procalcitonin: <0.1 ng/mL

## 2020-05-04 LAB — TRIGLYCERIDES: Triglycerides: 133 mg/dL (ref <150)

## 2020-05-04 LAB — C-REACTIVE PROTEIN: CRP: 1.1 mg/dL — ABNORMAL HIGH (ref <1.0)

## 2020-05-04 LAB — FERRITIN: Ferritin: 77 ng/mL (ref 11–307)

## 2020-05-04 LAB — RESP PANEL BY RT-PCR (FLU A&B, COVID) ARPGX2
Influenza A by PCR: NEGATIVE
Influenza B by PCR: NEGATIVE
SARS Coronavirus 2 by RT PCR: POSITIVE — AB

## 2020-05-04 LAB — LACTIC ACID, PLASMA: Lactic Acid, Venous: 0.6 mmol/L (ref 0.5–1.9)

## 2020-05-04 MED ORDER — SODIUM CHLORIDE 0.9% FLUSH: 3.0000 mL | INTRAVENOUS | Status: DC | PRN

## 2020-05-04 MED ORDER — FAMOTIDINE IN NACL 20-0.9 MG/50ML-% IV SOLN: 20.0000 mg | Freq: Once | INTRAVENOUS | Status: DC | PRN

## 2020-05-04 MED ORDER — SODIUM CHLORIDE 0.9 % IV SOLN: 1200.0000 mg | Freq: Once | INTRAVENOUS | Status: DC

## 2020-05-04 MED ORDER — ALBUTEROL SULFATE HFA 108 (90 BASE) MCG/ACT IN AERS: 2.0000 | INHALATION_SPRAY | Freq: Once | RESPIRATORY_TRACT | Status: DC | PRN

## 2020-05-04 MED ORDER — SODIUM CHLORIDE 0.9% FLUSH
3.0000 mL | Freq: Two times a day (BID) | INTRAVENOUS | Status: DC
  Administered 2020-05-04: 14:00:00 3 mL via INTRAVENOUS

## 2020-05-04 MED ORDER — SODIUM CHLORIDE 0.9 % IV SOLN
250.0000 mL | INTRAVENOUS | Status: DC | PRN
  Administered 2020-05-04: 14:00:00 250 mL via INTRAVENOUS

## 2020-05-04 MED ORDER — EPINEPHRINE 0.3 MG/0.3ML IJ SOAJ: 0.3000 mg | Freq: Once | INTRAMUSCULAR | Status: DC | PRN

## 2020-05-04 MED ORDER — DIPHENHYDRAMINE HCL 50 MG/ML IJ SOLN: 50.0000 mg | Freq: Once | INTRAMUSCULAR | Status: DC | PRN

## 2020-05-04 MED ORDER — SODIUM CHLORIDE 0.9 % IV SOLN: INTRAVENOUS | Status: DC | PRN

## 2020-05-04 MED ORDER — METHYLPREDNISOLONE SODIUM SUCC 125 MG IJ SOLR: 125.0000 mg | Freq: Once | INTRAMUSCULAR | Status: DC | PRN

## 2020-05-04 MED ORDER — SODIUM CHLORIDE 0.9 % IV SOLN
Freq: Once | INTRAVENOUS | Status: AC
  Filled 2020-05-04: qty 20

## 2020-05-04 NOTE — ED Provider Notes (Signed)
[facility] DEPT Provider Note   CSN: [id number] Arrival date & time: 05/04/20  1055     History Chief Complaint  Patient presents with  . Urinary Tract Infection  . Covid Positive    Leslie Coleman is a 86 y.o. female.  HPI    86 year old female comes in a chief complaint of weakness.  Patient has history of CKD, hypertension, VT, diabetes and she is unvaccinated for COVID-19.  Patient's daughter at the bedside.  Patient lives with her husband, who has home health.  Family lives nearby and is actively helping them.  On 12 9, patient was diagnosed with UTI.  At that time her COVID-19 test allegedly was negative.  Patient had an episode of confusion earlier today and EMS was called.  They wanted patient to come in for evaluation of sepsis versus COVID-19.  Since the last COVID-19 test, patient has had exposure to her son who is Covid positive.  Patient has been having cough for the last 3 days.  Her p.o. intake is unchanged.  She has not complained of any shortness of breath.  Past Medical History:  Diagnosis Date  . Abnormal PFTs (pulmonary function tests) 2008   No obstruction noted. Has been followed clinically.  . Anemia    treated with Procrit  . Fall from slip, trip, or stumble 05/07/2016   [place] [employer] resulting in right tibial plateau fracture  . Gout   . High risk medication use    on amiodarone.  Marland Kitchen HTN (hypertension)    Last echo in 2004; EF normal  . Hyperlipidemia   . Hypothyroidism    on replacement  . Irregular heart beat "since 1985"  . Obesity   . Type II diabetes mellitus ([place])   . V-tach [facility])    remote VT, treated initially with procainamide, on amiodarone since 2008    Patient Active Problem List   Diagnosis Date Noted  . Acute kidney injury ([place]) 10/19/2016  . Dizziness 10/19/2016  . CKD (chronic kidney disease) stage 4, GFR 15-29 ml/min (HCC) 10/19/2016  . Hypothyroidism, adult 10/19/2016  . Abnormal  urinalysis 10/19/2016  . Diabetes mellitus type 2, uncontrolled ([place]) 10/19/2016  . Anemia due to chronic kidney disease 10/19/2016  . Thrombocytopenia ([place]) 10/19/2016  . Candidal dermatitis 10/19/2016  . Stress incontinence 10/19/2016  . V-tach ([place]) 10/19/2016  . Pressure injury of skin 10/19/2016  . Diabetes mellitus with complication ([place])   . Urinary tract infection without hematuria   . CKD (chronic kidney disease), stage III ([place]) 05/20/2016  . Hypothyroidism 05/20/2016  . Closed fracture of right tibial plateau 05/12/2016  . Anemia in chronic kidney disease 12/16/2015  . Dizzy 07/07/2011  . Dehydration 07/07/2011  . Type II diabetes mellitus ([place]) 07/06/2011  . History of long-term treatment with high-risk medication 08/29/2010  . Abnormal PFTs (pulmonary function tests)   . History of ventricular tachycardia   . HTN (hypertension)   . Hyperlipidemia   . Gout     Past Surgical History:  Procedure Laterality Date  . CARDIOVASCULAR STRESS TEST  07/07/2003   EF 70%. NO EVIDENCE OF ISCHEMIA  . CARDIOVERSION  1978  . CATARACT EXTRACTION, BILATERAL Bilateral   . CATARACT EXTRACTION, BILATERAL Bilateral   . CESAREAN SECTION  1957; 1961; 1967  . FRACTURE SURGERY    . ORIF TIBIA PLATEAU Right 05/10/2016   Procedure: OPEN REDUCTION INTERNAL FIXATION (ORIF) RIGHT TIBIAL PLATEAU  WITH BONE GRAFT;  Surgeon: Leandrew Koyanagi, MD;  Location: [facility];  Service: Orthopedics;  Laterality: Right;  . US ECHOCARDIOGRAPHY  12/29/2002   EF 60-65%  . VAGINAL HYSTERECTOMY       OB History   No obstetric history on file.     Family History  Problem Relation Age of Onset  . Heart disease Mother   . Heart disease Father     Social History   Tobacco Use  . Smoking status: Never Smoker  . Smokeless tobacco: Never Used  Vaping Use  . Vaping Use: Never used  Substance Use Topics  . Alcohol use: No  . Drug use: No    Home Medications Prior to Admission medications   Medication Sig  Start Date End Date Taking? Authorizing Provider  amiodarone (PACERONE) 100 MG tablet Take 1 tablet by mouth every morning. Patient taking differently: Take 100 mg by mouth daily. 05/04/20  Yes Allred, Jeneen Rinks, MD  amLODipine (NORVASC) 5 MG tablet Take 1 tablet by mouth daily. Patient taking differently: Take 5 mg by mouth daily. 03/31/20  Yes Burtis Junes, NP  ASPIRIN LOW DOSE 81 MG EC tablet Take 1 tablet by mouth daily. Patient taking differently: Take 81 mg by mouth daily. 03/31/20  Yes Burtis Junes, NP  cephALEXin (KEFLEX) 500 MG capsule Take 1 capsule (500 mg total) by mouth 4 (four) times daily for 7 days. 04/29/20 05/06/20 Yes Volanda Napoleon, PA-C  Epoetin Alfa (PROCRIT IJ) Inject 10,000 Units as directed every 28 (twenty-eight) days.    Yes [provider]  furosemide (LASIX) 20 MG tablet Take 20 mg by mouth daily.   Yes [provider]  glipiZIDE (GLUCOTROL XL) 5 MG 24 hr tablet Take 1 tablet by mouth daily. Patient taking differently: Take 5 mg by mouth daily with breakfast. 10/03/19  Yes Burtis Junes, NP  hydrALAZINE (APRESOLINE) 50 MG tablet Take 1 tablet by mouth twice daily. Patient taking differently: Take 50 mg by mouth in the morning and at bedtime. 10/03/19  Yes Burtis Junes, NP  levothyroxine (SYNTHROID) 100 MCG tablet Take 1 tablet by mouth daily before breakfast. Patient taking differently: Take 100 mcg by mouth daily before breakfast. 12/02/19  Yes Burtis Junes, NP  Melatonin 5 MG CAPS Take 5 mg by mouth at bedtime.   Yes [provider]  Multiple Vitamin (MULTIVITAMIN WITH MINERALS) TABS tablet Take 1 tablet by mouth daily.   Yes [provider]  omega-3 acid ethyl esters (LOVAZA) 1 g capsule Take 2 capsules by mouth twice daily. Patient taking differently: Take 2 g by mouth 2 (two) times daily. 09/04/19  Yes Burtis Junes, NP  potassium chloride (KLOR-CON) 10 MEQ tablet Take 1 tablet by mouth every morning. Patient  taking differently: Take 10 mEq by mouth daily. 10/03/19  Yes Burtis Junes, NP  pravastatin (PRAVACHOL) 20 MG tablet Take 1 tablet by mouth at bedtime. Patient taking differently: Take 20 mg by mouth daily. 10/03/19  Yes Burtis Junes, NP  cephALEXin (KEFLEX) 500 MG capsule Take 1 capsule (500 mg total) by mouth 4 (four) times daily for 7 days. Patient not taking: Reported on 05/04/2020 04/29/20 05/06/20  Volanda Napoleon, PA-C    Allergies    Patient has no known allergies.  Review of Systems   Review of Systems  Constitutional: Positive for activity change and fatigue.  Respiratory: Positive for cough. Negative for shortness of breath.   Cardiovascular: Negative for chest pain.  Gastrointestinal: Negative for nausea and vomiting.  Allergic/Immunologic: Negative for immunocompromised state.  Hematological: Does not bruise/bleed easily.    Physical Exam Updated Vital Signs BP (!) 161/89   Pulse 70   Temp 97.9 F (36.6 C) (Oral)   Resp 16   SpO2 96%   Physical Exam Vitals and nursing note reviewed.  Constitutional:      General: She is not in acute distress.    Appearance: She is well-developed. She is not toxic-appearing.  HENT:     Head: Atraumatic.  Eyes:     Extraocular Movements: EOM normal.  Cardiovascular:     Rate and Rhythm: Normal rate.  Pulmonary:     Effort: Pulmonary effort is normal.  Abdominal:     General: Bowel sounds are normal.  Musculoskeletal:     Cervical back: Normal range of motion and neck supple.  Skin:    General: Skin is warm.  Neurological:     General: No focal deficit present.     Mental Status: She is alert.     ED Results / Procedures / Treatments   Labs (all labs ordered are listed, but only abnormal results are displayed) Labs Reviewed  RESP PANEL BY RT-PCR (FLU A&B, COVID) ARPGX2 - Abnormal; Notable for the following components:      Result Value   SARS Coronavirus 2 by RT PCR POSITIVE (*)    All other components  within normal limits  CBC WITH DIFFERENTIAL/PLATELET - Abnormal; Notable for the following components:   RBC 3.20 (*)    Hemoglobin 9.1 (*)    HCT 27.7 (*)    RDW 17.2 (*)    Platelets 73 (*)    All other components within normal limits  COMPREHENSIVE METABOLIC PANEL - Abnormal; Notable for the following components:   CO2 19 (*)    Glucose, Bld 59 (*)    BUN 26 (*)    Creatinine, Ser 2.30 (*)    Calcium 8.4 (*)    Total Protein 6.2 (*)    Albumin 3.0 (*)    Alkaline Phosphatase 157 (*)    GFR, Estimated 20 (*)    All other components within normal limits  D-DIMER, QUANTITATIVE (NOT AT [facility]) - Abnormal; Notable for the following components:   D-Dimer, Quant 3.25 (*)    All other components within normal limits  LACTATE DEHYDROGENASE - Abnormal; Notable for the following components:   LDH 205 (*)    All other components within normal limits  FIBRINOGEN - Abnormal; Notable for the following components:   Fibrinogen 544 (*)    All other components within normal limits  C-REACTIVE PROTEIN - Abnormal; Notable for the following components:   CRP 1.1 (*)    All other components within normal limits  CULTURE, BLOOD (ROUTINE X 2)  CULTURE, BLOOD (ROUTINE X 2)  URINE CULTURE  LACTIC ACID, PLASMA  PROCALCITONIN  FERRITIN  TRIGLYCERIDES  LACTIC ACID, PLASMA    EKG EKG Interpretation  Date/Time:  Tuesday May 04 2020 12:58:44 EST Ventricular Rate:  65 PR Interval:    QRS Duration: 95 QT Interval:  425 QTC Calculation: 442 R Axis:   26 Text Interpretation: Sinus rhythm Borderline prolonged PR interval Low voltage, precordial leads No acute changes No significant change since last tracing Confirmed by Varney Biles ([id number]) on 05/04/2020 1:48:38 PM   Radiology DG Chest Port 1 View  Result Date: 05/04/2020 CLINICAL DATA:  Cough.  Possible COVID. EXAM: PORTABLE CHEST 1 VIEW COMPARISON:  04/29/2020. FINDINGS: Mediastinum and hilar structures normal. Low lung volumes  with  mild bibasilar atelectasis/infiltrates. No pleural effusion or pneumothorax. Degenerative changes scoliosis thoracic spine. IMPRESSION: Low lung volumes with mild bibasilar atelectasis/infiltrates. Electronically Signed   By: Marcello Moores  Register   On: 05/04/2020 12:56    Procedures Procedures (including critical care time)  Medications Ordered in ED Medications  sodium chloride flush (NS) 0.9 % injection 3 mL (3 mLs Intravenous Given 05/04/20 1334)  sodium chloride flush (NS) 0.9 % injection 3 mL (has no administration in time range)  0.9 %  sodium chloride infusion (250 mLs Intravenous New Bag/Given 05/04/20 1335)  0.9 %  sodium chloride infusion (has no administration in time range)  diphenhydrAMINE (BENADRYL) injection 50 mg (has no administration in time range)  famotidine (PEPCID) IVPB 20 mg premix (has no administration in time range)  methylPREDNISolone sodium succinate (SOLU-MEDROL) 125 mg/2 mL injection 125 mg (has no administration in time range)  albuterol (VENTOLIN HFA) 108 (90 Base) MCG/ACT inhaler 2 puff (has no administration in time range)  EPINEPHrine (EPI-PEN) injection 0.3 mg (has no administration in time range)  bamlanivimab 700 mg, etesevimab 1,400 mg in sodium chloride 0.9 % 160 mL IVPB ( Intravenous Stopped 05/04/20 1702)    ED Course  I have reviewed the triage vital signs and the nursing notes.  Pertinent labs & imaging results that were available during my care of the patient were reviewed by me and considered in my medical decision making (see chart for details).    MDM Rules/Calculators/A&P                          Ilya A Nevins was evaluated in Emergency Department on 05/04/2020 for the symptoms described in the history of present illness. She was evaluated in the context of the global COVID-19 pandemic, which necessitated consideration that the patient might be at risk for infection with the SARS-CoV-2 virus that causes COVID-19. Institutional protocols and  algorithms that pertain to the evaluation of patients at risk for COVID-19 are in a state of rapid change based on information released by regulatory bodies including the CDC and federal and state organizations. These policies and algorithms were followed during the patient's care in the ED.   86 year old female comes in a chief complaint of confusion.  She had COVID-19 exposure and is on medications for UTI.  She is unvaccinated.  Based on history and exam, it appears that she is likely having COVID-19.  Her husband just tested positive as well.  Fortunately, there is no respiratory distress. Labs ordered and they are overall reassuring.  I discussed the findings with patient's daughter once the test results came back.  Patient is noted to be weak on reassessment, but in no distress.  No hypoxia.  No tachycardia.  She is a little concerned about patient's care at home.  She was however very willing with the hospital at home admission.  I spoke with Ms. Dotson at hospital at home and they will enroll the patient.  They will call Ms. Tammi Klippel, patient's daughter and confirmed.  The hospital at home approach will also make patient eligible for MAB which will actually have possible positive impact on her outcome.  5:12 PM Patient is done with her monoclonal antibody. She tolerated it well.  I spoke with Ms. Tammi Klippel, patient's daughter.  She will have her son pick the patient up.  She has already spoken with hospital at home and they will start assessing her either later today or tomorrow morning.  Final Clinical Impression(s) / ED Diagnoses Final diagnoses:  COVID-19  Generalized weakness  Confusion    Rx / DC Orders ED Discharge Orders    None       Varney Biles, MD 05/04/20 1712

## 2020-05-04 NOTE — ED Triage Notes (Addendum)
Pt BIB EMS from home. Pt lives with husband, pt has home health. Pt diagnosed last Thursday 12/9 with UTI. Pt had episode of confusion this morning. Pt has hx of diabetes. Pt denies pain with EMS. Pt A&Ox4 per EMS.   Temp 100.0 CBG 93 HR 86 BP 160/80 95% RA  20G L FA 700 ML NS given

## 2020-05-04 NOTE — ED Notes (Signed)
An After Visit Summary was printed and given to the patient. Discharge instructions given and no further questions at this time.  Pt leaving with son, pt helped to car where son is waiting. Pt able to stand and take a few steps into car unassisted.

## 2020-05-04 NOTE — Discharge Instructions (Addendum)
You are seen in the ER for weakness.  You have COVID-19.  You have been enrolled in hospital at home program.  They will assess you frequently.  Ensure that you are getting better.  If your symptoms get worse, please return to the ER.  Ensure that you are hydrating yourself well.  Consider taking daily baby aspirin.

## 2020-05-05 LAB — URINE CULTURE: Culture: <10000 — AB

## 2020-05-06 DIAGNOSIS — U071 COVID-19: Secondary | ICD-10-CM | POA: Diagnosis not present

## 2020-05-09 ENCOUNTER — Observation Stay (HOSPITAL_COMMUNITY)
Admission: EM | Admit: 2020-05-09 | Discharge: 2020-05-10 | Disposition: A | Discharge status: Home / Self Care | Payer: Medicare Other | Attending: Family Medicine | Admitting: Family Medicine

## 2020-05-09 ENCOUNTER — Other Ambulatory Visit: Payer: Self-pay

## 2020-05-09 ENCOUNTER — Emergency Department (HOSPITAL_COMMUNITY): Payer: Medicare Other

## 2020-05-09 DIAGNOSIS — E119 Type 2 diabetes mellitus without complications: Secondary | ICD-10-CM

## 2020-05-09 DIAGNOSIS — Z7984 Long term (current) use of oral hypoglycemic drugs: Secondary | ICD-10-CM | POA: Insufficient documentation

## 2020-05-09 DIAGNOSIS — Z79899 Other long term (current) drug therapy: Secondary | ICD-10-CM | POA: Insufficient documentation

## 2020-05-09 DIAGNOSIS — R059 Cough, unspecified: Secondary | ICD-10-CM | POA: Diagnosis not present

## 2020-05-09 DIAGNOSIS — R0602 Shortness of breath: Secondary | ICD-10-CM | POA: Diagnosis not present

## 2020-05-09 DIAGNOSIS — U071 COVID-19: Principal | ICD-10-CM | POA: Diagnosis present

## 2020-05-09 DIAGNOSIS — Z7982 Long term (current) use of aspirin: Secondary | ICD-10-CM | POA: Diagnosis not present

## 2020-05-09 DIAGNOSIS — R5381 Other malaise: Secondary | ICD-10-CM

## 2020-05-09 DIAGNOSIS — N184 Chronic kidney disease, stage 4 (severe): Secondary | ICD-10-CM | POA: Diagnosis not present

## 2020-05-09 DIAGNOSIS — Z7901 Long term (current) use of anticoagulants: Secondary | ICD-10-CM | POA: Insufficient documentation

## 2020-05-09 DIAGNOSIS — I10 Essential (primary) hypertension: Secondary | ICD-10-CM | POA: Diagnosis not present

## 2020-05-09 DIAGNOSIS — I129 Hypertensive chronic kidney disease with stage 1 through stage 4 chronic kidney disease, or unspecified chronic kidney disease: Secondary | ICD-10-CM | POA: Insufficient documentation

## 2020-05-09 DIAGNOSIS — E039 Hypothyroidism, unspecified: Secondary | ICD-10-CM | POA: Insufficient documentation

## 2020-05-09 DIAGNOSIS — J189 Pneumonia, unspecified organism: Secondary | ICD-10-CM

## 2020-05-09 DIAGNOSIS — E1122 Type 2 diabetes mellitus with diabetic chronic kidney disease: Secondary | ICD-10-CM | POA: Insufficient documentation

## 2020-05-09 DIAGNOSIS — R531 Weakness: Secondary | ICD-10-CM

## 2020-05-09 DIAGNOSIS — R627 Adult failure to thrive: Secondary | ICD-10-CM | POA: Diagnosis not present

## 2020-05-09 DIAGNOSIS — J90 Pleural effusion, not elsewhere classified: Secondary | ICD-10-CM | POA: Diagnosis not present

## 2020-05-09 DIAGNOSIS — Z8679 Personal history of other diseases of the circulatory system: Secondary | ICD-10-CM | POA: Diagnosis not present

## 2020-05-09 LAB — FERRITIN: Ferritin: 177 ng/mL (ref 11–307)

## 2020-05-09 LAB — HEPATIC FUNCTION PANEL
ALT: 19 U/L (ref 0–44)
AST: 28 U/L (ref 15–41)
Albumin: 2.9 g/dL — ABNORMAL LOW (ref 3.5–5.0)
Alkaline Phosphatase: 146 U/L — ABNORMAL HIGH (ref 38–126)
Bilirubin, Direct: 0.1 mg/dL (ref 0.0–0.2)
Indirect Bilirubin: 0.6 mg/dL (ref 0.3–0.9)
Total Bilirubin: 0.7 mg/dL (ref 0.3–1.2)
Total Protein: 6.2 g/dL — ABNORMAL LOW (ref 6.5–8.1)

## 2020-05-09 LAB — BASIC METABOLIC PANEL
Anion gap: 11 (ref 5–15)
BUN: 42 mg/dL — ABNORMAL HIGH (ref 8–23)
CO2: 14 mmol/L — ABNORMAL LOW (ref 22–32)
Calcium: 7.8 mg/dL — ABNORMAL LOW (ref 8.9–10.3)
Chloride: 112 mmol/L — ABNORMAL HIGH (ref 98–111)
Creatinine, Ser: 2.56 mg/dL — ABNORMAL HIGH (ref 0.44–1.00)
GFR, Estimated: 17 mL/min — ABNORMAL LOW (ref >60)
Glucose, Bld: 118 mg/dL — ABNORMAL HIGH (ref 70–99)
Potassium: 3.5 mmol/L (ref 3.5–5.1)
Sodium: 137 mmol/L (ref 135–145)

## 2020-05-09 LAB — D-DIMER, QUANTITATIVE: D-Dimer, Quant: 3.45 ug/mL-FEU — ABNORMAL HIGH (ref 0.00–0.50)

## 2020-05-09 LAB — C-REACTIVE PROTEIN: CRP: 6.3 mg/dL — ABNORMAL HIGH (ref <1.0)

## 2020-05-09 LAB — CBC WITH DIFFERENTIAL/PLATELET
Abs Immature Granulocytes: 0.08 10*3/uL — ABNORMAL HIGH (ref 0.00–0.07)
Basophils Absolute: 0 10*3/uL (ref 0.0–0.1)
Basophils Relative: 0 %
Eosinophils Absolute: 0 10*3/uL (ref 0.0–0.5)
Eosinophils Relative: 0 %
HCT: 28.9 % — ABNORMAL LOW (ref 36.0–46.0)
Hemoglobin: 9.5 g/dL — ABNORMAL LOW (ref 12.0–15.0)
Immature Granulocytes: 1 %
Lymphocytes Relative: 18 %
Lymphs Abs: 2 10*3/uL (ref 0.7–4.0)
MCH: 27.6 pg (ref 26.0–34.0)
MCHC: 32.9 g/dL (ref 30.0–36.0)
MCV: 84 fL (ref 80.0–100.0)
Monocytes Absolute: 0.9 10*3/uL (ref 0.1–1.0)
Monocytes Relative: 8 %
Neutro Abs: 8.1 10*3/uL — ABNORMAL HIGH (ref 1.7–7.7)
Neutrophils Relative %: 73 %
Platelets: 99 10*3/uL — ABNORMAL LOW (ref 150–400)
RBC: 3.44 MIL/uL — ABNORMAL LOW (ref 3.87–5.11)
RDW: 17.3 % — ABNORMAL HIGH (ref 11.5–15.5)
WBC: 11 10*3/uL — ABNORMAL HIGH (ref 4.0–10.5)
nRBC: 0 % (ref 0.0–0.2)

## 2020-05-09 LAB — LACTIC ACID, PLASMA: Lactic Acid, Venous: 0.9 mmol/L (ref 0.5–1.9)

## 2020-05-09 LAB — CULTURE, BLOOD (ROUTINE X 2)
Culture: NO GROWTH
Culture: NO GROWTH
Special Requests: ADEQUATE
Special Requests: ADEQUATE

## 2020-05-09 LAB — PROCALCITONIN: Procalcitonin: <0.1 ng/mL

## 2020-05-09 LAB — TRIGLYCERIDES: Triglycerides: 287 mg/dL — ABNORMAL HIGH (ref <150)

## 2020-05-09 LAB — FIBRINOGEN: Fibrinogen: 521 mg/dL — ABNORMAL HIGH (ref 210–475)

## 2020-05-09 LAB — LACTATE DEHYDROGENASE: LDH: 235 U/L — ABNORMAL HIGH (ref 98–192)

## 2020-05-09 MED ORDER — DEXAMETHASONE SODIUM PHOSPHATE 10 MG/ML IJ SOLN: 8.0000 mg | Freq: Once | INTRAMUSCULAR | Status: DC

## 2020-05-09 MED ORDER — SODIUM CHLORIDE 0.9 % IV SOLN: 100.0000 mg | Freq: Every day | INTRAVENOUS | Status: DC

## 2020-05-09 MED ORDER — AMIODARONE HCL 100 MG PO TABS
100.0000 mg | ORAL_TABLET | Freq: Every day | ORAL | Status: DC
  Administered 2020-05-09 – 2020-05-10 (×2): 100 mg via ORAL
  Filled 2020-05-09 (×2): qty 1

## 2020-05-09 MED ORDER — PRAVASTATIN SODIUM 20 MG PO TABS
20.0000 mg | ORAL_TABLET | Freq: Every day | ORAL | Status: DC
  Administered 2020-05-09 – 2020-05-10 (×2): 20 mg via ORAL
  Filled 2020-05-09 (×2): qty 1

## 2020-05-09 MED ORDER — SODIUM CHLORIDE 0.9 % IV SOLN
1.0000 g | INTRAVENOUS | Status: DC
  Administered 2020-05-09 – 2020-05-10 (×2): 1 g via INTRAVENOUS
  Filled 2020-05-09: qty 10
  Filled 2020-05-09: qty 1

## 2020-05-09 MED ORDER — ASPIRIN EC 81 MG PO TBEC
81.0000 mg | DELAYED_RELEASE_TABLET | Freq: Every day | ORAL | Status: DC
  Administered 2020-05-09 – 2020-05-10 (×2): 81 mg via ORAL
  Filled 2020-05-09 (×2): qty 1

## 2020-05-09 MED ORDER — IPRATROPIUM-ALBUTEROL 20-100 MCG/ACT IN AERS
1.0000 | INHALATION_SPRAY | Freq: Four times a day (QID) | RESPIRATORY_TRACT | Status: DC
  Administered 2020-05-09 – 2020-05-10 (×5): 1 via RESPIRATORY_TRACT
  Filled 2020-05-09: qty 4

## 2020-05-09 MED ORDER — LEVOTHYROXINE SODIUM 100 MCG PO TABS
100.0000 ug | ORAL_TABLET | Freq: Every day | ORAL | Status: DC
  Administered 2020-05-10: 06:00:00 100 ug via ORAL
  Filled 2020-05-09: qty 1

## 2020-05-09 MED ORDER — APIXABAN 2.5 MG PO TABS
2.5000 mg | ORAL_TABLET | Freq: Two times a day (BID) | ORAL | Status: DC
  Administered 2020-05-09 – 2020-05-10 (×2): 2.5 mg via ORAL
  Filled 2020-05-09 (×3): qty 1

## 2020-05-09 MED ORDER — FUROSEMIDE 20 MG PO TABS
20.0000 mg | ORAL_TABLET | Freq: Every day | ORAL | Status: DC
  Administered 2020-05-09 – 2020-05-10 (×2): 20 mg via ORAL
  Filled 2020-05-09 (×2): qty 1

## 2020-05-09 MED ORDER — AMLODIPINE BESYLATE 5 MG PO TABS
5.0000 mg | ORAL_TABLET | Freq: Every day | ORAL | Status: DC
  Administered 2020-05-09 – 2020-05-10 (×2): 5 mg via ORAL
  Filled 2020-05-09 (×2): qty 1

## 2020-05-09 MED ORDER — SODIUM CHLORIDE 0.9 % IV SOLN
200.0000 mg | Freq: Once | INTRAVENOUS | Status: DC
  Filled 2020-05-09: qty 40

## 2020-05-09 MED ORDER — MELATONIN 5 MG PO TABS
5.0000 mg | ORAL_TABLET | Freq: Every day | ORAL | Status: DC
  Administered 2020-05-09: 22:00:00 5 mg via ORAL
  Filled 2020-05-09: qty 1

## 2020-05-09 MED ORDER — GUAIFENESIN-DM 100-10 MG/5ML PO SYRP
10.0000 mL | ORAL_SOLUTION | ORAL | Status: DC | PRN
  Administered 2020-05-09 – 2020-05-10 (×3): 10 mL via ORAL
  Filled 2020-05-09 (×3): qty 10

## 2020-05-09 MED ORDER — ACETAMINOPHEN 325 MG PO TABS: 650.0000 mg | ORAL_TABLET | Freq: Four times a day (QID) | ORAL | Status: DC | PRN

## 2020-05-09 MED ORDER — HYDRALAZINE HCL 50 MG PO TABS
50.0000 mg | ORAL_TABLET | Freq: Two times a day (BID) | ORAL | Status: DC
  Administered 2020-05-09 – 2020-05-10 (×2): 50 mg via ORAL
  Filled 2020-05-09 (×2): qty 1

## 2020-05-09 MED ORDER — LACTATED RINGERS IV BOLUS
500.0000 mL | Freq: Once | INTRAVENOUS | Status: AC
  Administered 2020-05-09: 16:00:00 500 mL via INTRAVENOUS

## 2020-05-09 MED ORDER — AZITHROMYCIN 250 MG PO TABS
250.0000 mg | ORAL_TABLET | Freq: Every day | ORAL | Status: AC
  Administered 2020-05-09: 16:00:00 250 mg via ORAL
  Filled 2020-05-09: qty 1

## 2020-05-09 NOTE — Progress Notes (Signed)
Pt. Came in via stretcher accompanied by ED staff. Pt. Is alert and oriented. Vital sign taken and recorded. Pt. Is oriented to room and use of call bell. Instructed to call for help whenever needed. Needs attended to.

## 2020-05-09 NOTE — ED Notes (Signed)
ED Provider at bedside. 

## 2020-05-09 NOTE — ED Notes (Signed)
Pt placed on purewick 

## 2020-05-09 NOTE — ED Notes (Signed)
Pt stated that she did not feel able to ambulate due to weakness and fatigue, RN made aware.

## 2020-05-09 NOTE — ED Notes (Signed)
Called report to [facility]. At this time this RN is waiting on 4pm meds from pharm. Will transport after meds can be given.

## 2020-05-09 NOTE — ED Notes (Signed)
Daughter called and RN updated family.

## 2020-05-09 NOTE — ED Notes (Signed)
MD made aware patient reports she is too weak to ambulate.

## 2020-05-09 NOTE — ED Notes (Signed)
Rindi Beechy (daughter) [phone]

## 2020-05-09 NOTE — ED Provider Notes (Signed)
[facility] DEPT Provider Note   CSN: [id number] Arrival date & time: 05/09/20  0957     History Chief Complaint  Patient presents with  . Covid Positive    Leslie Coleman is a 86 y.o. female history of hypertension, hyperlipidemia, V. tach, diabetes, CKD.  Patient brought in by EMS today for concern of shortness of breath and coughing.  Patient reports that her daughter has been asking her to come get evaluated ED for several days which is why EMS was called.  She reports some shortness of breath with coughing spells reports nonproductive cough for the past several days.  She reports she was tested positive for COVID-19 last week, and is unvaccinated.  Denies fever/chills, fall/injury, chest pain, hemoptysis, abdominal pain, nausea/vomiting, diarrhea, extremity swelling/color change or any additional concerns  HPI     Past Medical History:  Diagnosis Date  . Abnormal PFTs (pulmonary function tests) 2008   No obstruction noted. Has been followed clinically.  . Anemia    treated with Procrit  . Fall from slip, trip, or stumble 05/07/2016   [place] [employer] resulting in right tibial plateau fracture  . Gout   . High risk medication use    on amiodarone.  Marland Kitchen HTN (hypertension)    Last echo in 2004; EF normal  . Hyperlipidemia   . Hypothyroidism    on replacement  . Irregular heart beat "since 1985"  . Obesity   . Type II diabetes mellitus ([place])   . V-tach [facility])    remote VT, treated initially with procainamide, on amiodarone since 2008    Patient Active Problem List   Diagnosis Date Noted  . COVID-19 05/09/2020  . Acute kidney injury ([place]) 10/19/2016  . Dizziness 10/19/2016  . CKD (chronic kidney disease) stage 4, GFR 15-29 ml/min (HCC) 10/19/2016  . Hypothyroidism, adult 10/19/2016  . Abnormal urinalysis 10/19/2016  . Diabetes mellitus type 2, uncontrolled ([place]) 10/19/2016  . Anemia due to chronic kidney disease 10/19/2016  .  Thrombocytopenia ([place]) 10/19/2016  . Candidal dermatitis 10/19/2016  . Stress incontinence 10/19/2016  . V-tach ([place]) 10/19/2016  . Pressure injury of skin 10/19/2016  . Diabetes mellitus with complication ([place])   . Urinary tract infection without hematuria   . CKD (chronic kidney disease), stage III ([place]) 05/20/2016  . Hypothyroidism 05/20/2016  . Closed fracture of right tibial plateau 05/12/2016  . Anemia in chronic kidney disease 12/16/2015  . Dizzy 07/07/2011  . Dehydration 07/07/2011  . Type II diabetes mellitus ([place]) 07/06/2011  . History of long-term treatment with high-risk medication 08/29/2010  . Abnormal PFTs (pulmonary function tests)   . History of ventricular tachycardia   . HTN (hypertension)   . Hyperlipidemia   . Gout     Past Surgical History:  Procedure Laterality Date  . CARDIOVASCULAR STRESS TEST  07/07/2003   EF 70%. NO EVIDENCE OF ISCHEMIA  . CARDIOVERSION  1978  . CATARACT EXTRACTION, BILATERAL Bilateral   . CATARACT EXTRACTION, BILATERAL Bilateral   . CESAREAN SECTION  1957; 1961; 1967  . FRACTURE SURGERY    . ORIF TIBIA PLATEAU Right 05/10/2016   Procedure: OPEN REDUCTION INTERNAL FIXATION (ORIF) RIGHT TIBIAL PLATEAU  WITH BONE GRAFT;  Surgeon: Leandrew Koyanagi, MD;  Location: [facility];  Service: Orthopedics;  Laterality: Right;  . US ECHOCARDIOGRAPHY  12/29/2002   EF 60-65%  . VAGINAL HYSTERECTOMY       OB History   No obstetric history on file.  Family History  Problem Relation Age of Onset  . Heart disease Mother   . Heart disease Father     Social History   Tobacco Use  . Smoking status: Never Smoker  . Smokeless tobacco: Never Used  Vaping Use  . Vaping Use: Never used  Substance Use Topics  . Alcohol use: No  . Drug use: No    Home Medications Prior to Admission medications   Medication Sig Start Date End Date Taking? Authorizing Provider  amiodarone (PACERONE) 100 MG tablet Take 1 tablet by mouth every morning. Patient taking  differently: Take 100 mg by mouth daily. 05/04/20   Allred, Jeneen Rinks, MD  amLODipine (NORVASC) 5 MG tablet Take 1 tablet by mouth daily. Patient taking differently: Take 5 mg by mouth daily. 03/31/20   Burtis Junes, NP  ASPIRIN LOW DOSE 81 MG EC tablet Take 1 tablet by mouth daily. Patient taking differently: Take 81 mg by mouth daily. 03/31/20   Burtis Junes, NP  azithromycin (ZITHROMAX) 250 MG tablet Take by mouth. 05/05/20   [provider]  dexamethasone (DECADRON) 6 MG tablet Take 6 mg by mouth at bedtime. For 10 days 05/05/20   [provider]  ELIQUIS 2.5 MG TABS tablet Take 2.5 mg by mouth 2 (two) times daily. 05/06/20   [provider]  Epoetin Alfa (PROCRIT IJ) Inject 10,000 Units as directed every 28 (twenty-eight) days.     [provider]  furosemide (LASIX) 20 MG tablet Take 20 mg by mouth daily.    [provider]  glipiZIDE (GLUCOTROL XL) 5 MG 24 hr tablet Take 1 tablet by mouth daily. Patient taking differently: Take 5 mg by mouth daily with breakfast. 10/03/19   Burtis Junes, NP  hydrALAZINE (APRESOLINE) 50 MG tablet Take 1 tablet by mouth twice daily. Patient taking differently: Take 50 mg by mouth in the morning and at bedtime. 10/03/19   Burtis Junes, NP  levothyroxine (SYNTHROID) 100 MCG tablet Take 1 tablet by mouth daily before breakfast. Patient taking differently: Take 100 mcg by mouth daily before breakfast. 12/02/19   Burtis Junes, NP  Melatonin 5 MG CAPS Take 5 mg by mouth at bedtime.    [provider]  Multiple Vitamin (MULTIVITAMIN WITH MINERALS) TABS tablet Take 1 tablet by mouth daily.    [provider]  omega-3 acid ethyl esters (LOVAZA) 1 g capsule Take 2 capsules by mouth twice daily. Patient taking differently: Take 2 g by mouth 2 (two) times daily. 09/04/19   Burtis Junes, NP  potassium chloride (KLOR-CON) 10 MEQ tablet Take 1 tablet by mouth every morning. Patient taking  differently: Take 10 mEq by mouth daily. 10/03/19   Burtis Junes, NP  pravastatin (PRAVACHOL) 20 MG tablet Take 1 tablet by mouth at bedtime. Patient taking differently: Take 20 mg by mouth daily. 10/03/19   Burtis Junes, NP    Allergies    Patient has no known allergies.  Review of Systems   Review of Systems Ten systems are reviewed and are negative for acute change except as noted in the HPI  Physical Exam Updated Vital Signs BP (!) 182/80   Pulse 70   Temp 97.8 F (36.6 C) (Oral)   Resp 20   Ht 5\' 2"  (1.575 m)   Wt 68.9 kg   SpO2 95%   BMI 27.80 kg/m   Physical Exam Constitutional:      General: She is not in acute distress.  Appearance: Normal appearance. She is well-developed. She is not ill-appearing or diaphoretic.  HENT:     Head: Normocephalic and atraumatic.  Eyes:     General: Vision grossly intact. Gaze aligned appropriately.     Pupils: Pupils are equal, round, and reactive to light.  Neck:     Trachea: Trachea and phonation normal.  Cardiovascular:     Rate and Rhythm: Normal rate and regular rhythm.  Pulmonary:     Effort: Pulmonary effort is normal. No accessory muscle usage or respiratory distress.     Breath sounds: Normal air entry. Decreased breath sounds present.  Abdominal:     General: There is no distension.     Palpations: Abdomen is soft.     Tenderness: There is no abdominal tenderness. There is no guarding or rebound.  Musculoskeletal:        General: Normal range of motion.     Cervical back: Normal range of motion.     Right lower leg: No edema.     Left lower leg: No edema.  Skin:    General: Skin is warm and dry.  Neurological:     Mental Status: She is alert.     GCS: GCS eye subscore is 4. GCS verbal subscore is 5. GCS motor subscore is 6.     Comments: Speech is clear and goal oriented, follows commands Major Cranial nerves without deficit, no facial droop Moves extremities without ataxia, coordination intact   Psychiatric:        Behavior: Behavior normal.     ED Results / Procedures / Treatments   Labs (all labs ordered are listed, but only abnormal results are displayed) Labs Reviewed  CBC WITH DIFFERENTIAL/PLATELET - Abnormal; Notable for the following components:      Result Value   WBC 11.0 (*)    RBC 3.44 (*)    Hemoglobin 9.5 (*)    HCT 28.9 (*)    RDW 17.3 (*)    Platelets 99 (*)    Neutro Abs 8.1 (*)    Abs Immature Granulocytes 0.08 (*)    All other components within normal limits  BASIC METABOLIC PANEL - Abnormal; Notable for the following components:   Chloride 112 (*)    CO2 14 (*)    Glucose, Bld 118 (*)    BUN 42 (*)    Creatinine, Ser 2.56 (*)    Calcium 7.8 (*)    GFR, Estimated 17 (*)    All other components within normal limits  CULTURE, BLOOD (ROUTINE X 2)  CULTURE, BLOOD (ROUTINE X 2)  URINALYSIS, ROUTINE W REFLEX MICROSCOPIC  LACTIC ACID, PLASMA  LACTIC ACID, PLASMA  D-DIMER, QUANTITATIVE (NOT AT [facility])  PROCALCITONIN  LACTATE DEHYDROGENASE  FERRITIN  TRIGLYCERIDES  FIBRINOGEN  C-REACTIVE PROTEIN  HEPATIC FUNCTION PANEL    EKG EKG Interpretation  Date/Time:  Sunday May 09 2020 10:48:42 EST Ventricular Rate:  75 PR Interval:    QRS Duration: 115 QT Interval:  400 QTC Calculation: 447 R Axis:   34 Text Interpretation: Sinus rhythm Nonspecific intraventricular conduction delay 12 Lead; Mason-Likar Confirmed by Dewaine Conger [phone]) on 05/09/2020 1:40:54 PM   Radiology DG Chest Portable 1 View  Result Date: 05/09/2020 CLINICAL DATA:  Shortness of breath and cough. The patient tested positive for COVID-19 with a home test last week. EXAM: PORTABLE CHEST 1 VIEW COMPARISON:  Single-view of the chest 05/04/2020 and 04/29/2020. PA and lateral chest 10/19/2016. FINDINGS: There is some patchy airspace disease in the left  lung base. A few calcified granulomata are seen in the right upper lobe. Right lung is otherwise clear. Heart size is normal.  Aortic atherosclerosis. No pneumothorax or pleural effusion. IMPRESSION: No marked change in patchy left basilar airspace disease worrisome for pneumonia since the most recent examination. Electronically Signed   By: Inge Rise M.D.   On: 05/09/2020 12:25    Procedures Procedures (including critical care time)  Medications Ordered in ED Medications  dexamethasone (DECADRON) injection 8 mg (has no administration in time range)  remdesivir 200 mg in sodium chloride 0.9% 250 mL IVPB (has no administration in time range)  remdesivir 100 mg in sodium chloride 0.9 % 100 mL IVPB (has no administration in time range)    ED Course  I have reviewed the triage vital signs and the nursing notes.  Pertinent labs & imaging results that were available during my care of the patient were reviewed by me and considered in my medical decision making (see chart for details).  Clinical Course as of 05/09/20 1402  Sun May 09, 2020  1328 Dr. Neysa Bonito [BM]    Clinical Course User Index [BM] Gari Crown   MDM Rules/Calculators/A&P                         Additional history obtained from: 1. Nursing notes from this visit. 2. Review of electronic medical records. 3. Family at bedside.  Leslie Coleman was evaluated in Emergency Department on 05/09/2020 for the symptoms described in the history of present illness. She was evaluated in the context of the global COVID-19 pandemic, which necessitated consideration that the patient might be at risk for infection with the SARS-CoV-2 virus that causes COVID-19. Institutional protocols and algorithms that pertain to the evaluation of patients at risk for COVID-19 are in a state of rapid change based on information released by regulatory bodies including the CDC and federal and state organizations. These policies and algorithms were followed during the patient's care in the ED.  86 year old unvaccinated female presented via EMS today for cough shortness  of breath and generalized fatigue in the setting of COVID-19 infection.  On exam she is tired appearing no acute distress she denies any chest pain.  She has no fever or tachycardia on room air, SPO2 fluctuates between mid 90s and 90% on room air with talking.  Will obtain basic labs and chest x-ray, patient is very upset about possibility of admission she reports her husband is very sick at home and she wants to be with him. - Supplemental history obtained by patient's daughter with patient's permission.  Patient's daughter reports that the patient's husband is on his last few days on hospice care.  Patient's daughter is very concerned for patient is unable to get up out of bed and perform ADLs at home and they are unable to help her.  [name] talking with patient about the need for admission.  CBC shows leukocytosis of 11.0 which is new from 5 days ago, baseline hemoglobin of 9.5 BMP shows baseline kidney function with creatinine 2.56, no emergent electrolyte derangement or gap.  CO2 low at 14.  No gap.  Patient does not appear to be in DKA.  Possible secondary to dehydration will obtain urinalysis.  CXR:  IMPRESSION:  No marked change in patchy left basilar airspace disease worrisome  for pneumonia since the most recent examination.   EKG: Sinus rhythm Nonspecific intraventricular conduction delay 12 Lead; Mason-Likar Confirmed by Ron Parker,  Randall Hiss [phone]) on 05/09/2020 1:40:54 PM - Patient reevaluated she is resting in bed no acute distress.  She reports she is too weak to get out of bed, shared discussion with patient, she is agreeable for admission.  Will consult hospitalist service.  Remainder of Covid order set utilized.  Remdesivir/Decadron ordered.  Case discussed with Dr. Ron Parker who agrees with plan of care and admission. - 1:28 PM: Consulted Dr. Neysa Bonito, patient accepted to hospitalist service.   Note: Portions of this report may have been transcribed using voice recognition software. Every effort  was made to ensure accuracy; however, inadvertent computerized transcription errors may still be present. Final Clinical Impression(s) / ED Diagnoses Final diagnoses:  COVID-19  Generalized weakness    Rx / DC Orders ED Discharge Orders    None       Gari Crown 05/09/20 1403    Breck Coons, MD 05/09/20 [phone]

## 2020-05-09 NOTE — H&P (Signed)
History and Physical        Hospital Admission Note Date: 05/09/2020  Patient name: Leslie Coleman Medical record number: [id number] Date of birth: 03-15-1929 Age: 86 y.o. Gender: female  PCP: System, Provider Not In  Patient coming from: Home Lives with: Husband At baseline, ambulates: Psychiatric nurse Complaint  Patient presents with  . Covid Positive      HPI:   This is an 86 year old female who is unvaccinated against COVID-19 with a past medical history of hypertension, hyperlipidemia, hypothyroidism, type 2 diabetes, V. tach, stress incontinence, CKD 4 who presented to the ED via EMS from home with shortness of breath and cough.  Patient recently tested positive for COVID-19 on 12/14 and was treated with monoclonal antibody on that day.  According to the daughter and son-in-law, the patient has had significant decline in her health over the past couple weeks especially since her recent COVID-19 diagnosis and has had failure to thrive type symptoms.  They have had difficulty taking care of her at home especially while having to take care of the patient's husband who is on hospice and has days to live.  They state that she has been having urinary and bowel incontinence recently as well and difficulty with ambulating and fell recently as well.  Since her Covid diagnosis she has had a productive cough but no fevers.  She has been taking azithromycin and dexamethasone since 12/15 without improvement.  All the symptoms are prompted family to bring the patient to the ED.  The patient currently denies any symptoms.   ED Course: Afebrile, hemodynamically stable, on room air. Notable Labs: Sodium 137, K3.5, CO2 14 (chronically low), BUN 42, creatinine 2.56 (at baseline), WBC 11.0, Hb 9.5. Notable Imaging: Patchy left basilar airspace disease worrisome for pneumonia and is  unchanged from previous CXR.  Per EDP the patient seems very weak and frail and not stable enough to go home and recommended that she be observed in the hospital.  Patient was hoping to go home as her husband is on his last days and hospice but is agreeable to staying in the hospital.  Vitals:   05/09/20 1240 05/09/20 1330  BP: (!) 147/88 (!) 182/80  Pulse: 72 70  Resp: 18 20  Temp:    SpO2: 97% 95%     Review of Systems:  Review of Systems  All other systems reviewed and are negative.   Medical/Social/Family History   Past Medical History: Past Medical History:  Diagnosis Date  . Abnormal PFTs (pulmonary function tests) 2008   No obstruction noted. Has been followed clinically.  . Anemia    treated with Procrit  . Fall from slip, trip, or stumble 05/07/2016   [place] [employer] resulting in right tibial plateau fracture  . Gout   . High risk medication use    on amiodarone.  Marland Kitchen HTN (hypertension)    Last echo in 2004; EF normal  . Hyperlipidemia   . Hypothyroidism    on replacement  . Irregular heart beat "since 1985"  . Obesity   . Type II diabetes mellitus ([place])   . V-tach [facility])    remote VT, treated initially with  procainamide, on amiodarone since 2008    Past Surgical History:  Procedure Laterality Date  . CARDIOVASCULAR STRESS TEST  07/07/2003   EF 70%. NO EVIDENCE OF ISCHEMIA  . CARDIOVERSION  1978  . CATARACT EXTRACTION, BILATERAL Bilateral   . CATARACT EXTRACTION, BILATERAL Bilateral   . CESAREAN SECTION  1957; 1961; 1967  . FRACTURE SURGERY    . ORIF TIBIA PLATEAU Right 05/10/2016   Procedure: OPEN REDUCTION INTERNAL FIXATION (ORIF) RIGHT TIBIAL PLATEAU  WITH BONE GRAFT;  Surgeon: Leandrew Koyanagi, MD;  Location: [facility];  Service: Orthopedics;  Laterality: Right;  . US ECHOCARDIOGRAPHY  12/29/2002   EF 60-65%  . VAGINAL HYSTERECTOMY      Medications: Prior to Admission medications   Medication Sig Start Date End Date Taking? Authorizing Provider  amiodarone  (PACERONE) 100 MG tablet Take 1 tablet by mouth every morning. Patient taking differently: Take 100 mg by mouth daily. 05/04/20   Allred, Jeneen Rinks, MD  amLODipine (NORVASC) 5 MG tablet Take 1 tablet by mouth daily. Patient taking differently: Take 5 mg by mouth daily. 03/31/20   Burtis Junes, NP  ASPIRIN LOW DOSE 81 MG EC tablet Take 1 tablet by mouth daily. Patient taking differently: Take 81 mg by mouth daily. 03/31/20   Burtis Junes, NP  azithromycin (ZITHROMAX) 250 MG tablet Take by mouth. 05/05/20   [provider]  dexamethasone (DECADRON) 6 MG tablet Take 6 mg by mouth at bedtime. For 10 days 05/05/20   [provider]  ELIQUIS 2.5 MG TABS tablet Take 2.5 mg by mouth 2 (two) times daily. 05/06/20   [provider]  Epoetin Alfa (PROCRIT IJ) Inject 10,000 Units as directed every 28 (twenty-eight) days.     [provider]  furosemide (LASIX) 20 MG tablet Take 20 mg by mouth daily.    [provider]  glipiZIDE (GLUCOTROL XL) 5 MG 24 hr tablet Take 1 tablet by mouth daily. Patient taking differently: Take 5 mg by mouth daily with breakfast. 10/03/19   Burtis Junes, NP  hydrALAZINE (APRESOLINE) 50 MG tablet Take 1 tablet by mouth twice daily. Patient taking differently: Take 50 mg by mouth in the morning and at bedtime. 10/03/19   Burtis Junes, NP  levothyroxine (SYNTHROID) 100 MCG tablet Take 1 tablet by mouth daily before breakfast. Patient taking differently: Take 100 mcg by mouth daily before breakfast. 12/02/19   Burtis Junes, NP  Melatonin 5 MG CAPS Take 5 mg by mouth at bedtime.    [provider]  Multiple Vitamin (MULTIVITAMIN WITH MINERALS) TABS tablet Take 1 tablet by mouth daily.    [provider]  omega-3 acid ethyl esters (LOVAZA) 1 g capsule Take 2 capsules by mouth twice daily. Patient taking differently: Take 2 g by mouth 2 (two) times daily. 09/04/19   Burtis Junes, NP  potassium chloride  (KLOR-CON) 10 MEQ tablet Take 1 tablet by mouth every morning. Patient taking differently: Take 10 mEq by mouth daily. 10/03/19   Burtis Junes, NP  pravastatin (PRAVACHOL) 20 MG tablet Take 1 tablet by mouth at bedtime. Patient taking differently: Take 20 mg by mouth daily. 10/03/19   Burtis Junes, NP    Allergies:  No Known Allergies  Social History:  reports that she has never smoked. She has never used smokeless tobacco. She reports that she does not drink alcohol and does not use drugs.  Family History: Family History  Problem Relation Age of Onset  .  Heart disease Mother   . Heart disease Father      Objective   Physical Exam: Blood pressure (!) 182/80, pulse 70, temperature 97.8 F (36.6 C), temperature source Oral, resp. rate 20, height 5\' 2"  (1.575 m), weight 68.9 kg, SpO2 95 %.  Physical Exam Vitals and nursing note reviewed.  Constitutional:      General: She is not in acute distress.    Comments: Frail elderly female  HENT:     Head: Normocephalic.  Eyes:     Conjunctiva/sclera: Conjunctivae normal.  Cardiovascular:     Rate and Rhythm: Normal rate.     Pulses: Normal pulses.  Pulmonary:     Effort: Pulmonary effort is normal. No respiratory distress.  Abdominal:     General: Abdomen is flat. There is no distension.  Musculoskeletal:        General: No swelling or tenderness.  Skin:    General: Skin is warm.     Coloration: Skin is not jaundiced.  Neurological:     Mental Status: She is alert. Mental status is at baseline.  Psychiatric:        Mood and Affect: Mood normal. Affect is tearful.     Comments: Tearful when talking about her dying husband     LABS on Admission: I have personally reviewed all the labs and imaging below    Basic Metabolic Panel: Recent Labs  Lab 05/04/20 1315 05/09/20 1107  NA 138 137  K 4.5 3.5  CL 110 112*  CO2 19* 14*  GLUCOSE 59* 118*  BUN 26* 42*  CREATININE 2.30* 2.56*  CALCIUM 8.4* 7.8*   Liver  Function Tests: Recent Labs  Lab 05/04/20 1315 05/09/20 1330  AST 27 28  ALT 14 19  ALKPHOS 157* 146*  BILITOT 0.5 0.7  PROT 6.2* 6.2*  ALBUMIN 3.0* 2.9*   No results for input(s): LIPASE, AMYLASE in the last 168 hours. No results for input(s): AMMONIA in the last 168 hours. CBC: Recent Labs  Lab 05/04/20 1315 05/09/20 1107  WBC 5.2 11.0*  NEUTROABS 3.5 8.1*  HGB 9.1* 9.5*  HCT 27.7* 28.9*  MCV 86.6 84.0  PLT 73* 99*   Cardiac Enzymes: No results for input(s): CKTOTAL, CKMB, CKMBINDEX, TROPONINI in the last 168 hours. BNP: Invalid input(s): POCBNP CBG: No results for input(s): GLUCAP in the last 168 hours.  Radiological Exams on Admission:  DG Chest Portable 1 View  Result Date: 05/09/2020 CLINICAL DATA:  Shortness of breath and cough. The patient tested positive for COVID-19 with a home test last week. EXAM: PORTABLE CHEST 1 VIEW COMPARISON:  Single-view of the chest 05/04/2020 and 04/29/2020. PA and lateral chest 10/19/2016. FINDINGS: There is some patchy airspace disease in the left lung base. A few calcified granulomata are seen in the right upper lobe. Right lung is otherwise clear. Heart size is normal. Aortic atherosclerosis. No pneumothorax or pleural effusion. IMPRESSION: No marked change in patchy left basilar airspace disease worrisome for pneumonia since the most recent examination. Electronically Signed   By: Inge Rise M.D.   On: 05/09/2020 12:25      EKG: normal EKG, normal sinus rhythm   A & P   Principal Problem:   Failure to thrive in adult Active Problems:   History of ventricular tachycardia   Type II diabetes mellitus (HCC)   CKD (chronic kidney disease) stage 4, GFR 15-29 ml/min ([place])   COVID-19   CAP (community acquired pneumonia)   Debility   1. Failure  to thrive secondary to debility, COVID-19 and concern for developing CAP a. PT eval b. Dietitian consult c. IV fluids d. Treatment as below  2. COVID-19 and concern for  developing CAP a. Afebrile and hemodynamically stable on room air with leukocytosis and with productive cough b. CXR with patchy left basilar airspace disease concerning for pneumonia c. Has been taking azithromycin and dexamethasone at home, today is day 5 of both d. No need for remdesivir or dexamethasone at this time as she is not hypoxic e. Complete azithromycin today f. Will give a dose of ceftriaxone and IV fluids today.  Consider discharge tomorrow with oral antibiotics pending PT eval and improvement g. Antitussives  3. History of VT  a. initially treated with procainamide and now on amiodarone  4. Diabetes a. Glucose currently controlled b. Carb modified diet  5. Mechanical fall a. Has had falls over the past several months and is on high risk medications including Eliquis b. Med rec c. PT eval  6. CKD IV, stable    DVT prophylaxis: Eliquis, unclear why she is on this medication in addition to aspirin.   Code Status: Full Code  Diet: Heart healthy carb modified Family Communication: Admission, patients condition and plan of care including tests being ordered have been discussed with the patient who indicates understanding and agrees with the plan and Code Status. Patient's daughter and son-in-law was updated  Disposition Plan: The appropriate patient status for this patient is OBSERVATION. Observation status is judged to be reasonable and necessary in order to provide the required intensity of service to ensure the patient's safety. The patient's presenting symptoms, physical exam findings, and initial radiographic and laboratory data in the context of their medical condition is felt to place them at decreased risk for further clinical deterioration. Furthermore, it is anticipated that the patient will be medically stable for discharge from the hospital within 2 midnights of admission. The following factors support the patient status of observation.   " The patient's  presenting symptoms include failure to thrive, cough. " The physical exam findings include unremarkable. " The initial radiographic and laboratory data are COVID-19 positive and leukocytosis.      Consultants  . None  Procedures  . None  Time Spent on Admission: 68 minutes    Harold Hedge, DO [facility]  05/09/2020, 2:59 PM

## 2020-05-09 NOTE — ED Triage Notes (Signed)
Transported by [facility] from home-- diagnosed with COVID last week and presents today with worsening symptoms-- shob (95% on RA) and cough. Afebrile, denies body aches, chest pain or n/v/d.

## 2020-05-10 ENCOUNTER — Other Ambulatory Visit: Payer: Self-pay

## 2020-05-10 ENCOUNTER — Encounter (HOSPITAL_COMMUNITY): Payer: Self-pay | Admitting: Internal Medicine

## 2020-05-10 DIAGNOSIS — U071 COVID-19: Secondary | ICD-10-CM | POA: Diagnosis not present

## 2020-05-10 DIAGNOSIS — Z8679 Personal history of other diseases of the circulatory system: Secondary | ICD-10-CM

## 2020-05-10 DIAGNOSIS — N184 Chronic kidney disease, stage 4 (severe): Secondary | ICD-10-CM | POA: Diagnosis not present

## 2020-05-10 DIAGNOSIS — R627 Adult failure to thrive: Secondary | ICD-10-CM

## 2020-05-10 DIAGNOSIS — E1122 Type 2 diabetes mellitus with diabetic chronic kidney disease: Secondary | ICD-10-CM | POA: Diagnosis not present

## 2020-05-10 DIAGNOSIS — R5381 Other malaise: Secondary | ICD-10-CM

## 2020-05-10 LAB — CBC WITH DIFFERENTIAL/PLATELET
Abs Immature Granulocytes: 0 10*3/uL (ref 0.00–0.07)
Band Neutrophils: 0 %
Basophils Absolute: 0 10*3/uL (ref 0.0–0.1)
Basophils Relative: 0 %
Blasts: 0 %
Eosinophils Absolute: 0.1 10*3/uL (ref 0.0–0.5)
Eosinophils Relative: 1 %
HCT: 25.7 % — ABNORMAL LOW (ref 36.0–46.0)
Hemoglobin: 8.3 g/dL — ABNORMAL LOW (ref 12.0–15.0)
Lymphocytes Relative: 30 %
Lymphs Abs: 2.1 10*3/uL (ref 0.7–4.0)
MCH: 27.4 pg (ref 26.0–34.0)
MCHC: 32.3 g/dL (ref 30.0–36.0)
MCV: 84.8 fL (ref 80.0–100.0)
Metamyelocytes Relative: 0 %
Monocytes Absolute: 0.5 10*3/uL (ref 0.1–1.0)
Monocytes Relative: 7 %
Myelocytes: 0 %
Neutro Abs: 4.3 10*3/uL (ref 1.7–7.7)
Neutrophils Relative %: 62 %
Other: 0 %
Platelets: 86 10*3/uL — ABNORMAL LOW (ref 150–400)
Promyelocytes Relative: 0 %
RBC: 3.03 MIL/uL — ABNORMAL LOW (ref 3.87–5.11)
RDW: 17.3 % — ABNORMAL HIGH (ref 11.5–15.5)
WBC: 7 10*3/uL (ref 4.0–10.5)
nRBC: 0 % (ref 0.0–0.2)
nRBC: 0 /100 WBC

## 2020-05-10 LAB — COMPREHENSIVE METABOLIC PANEL
ALT: 17 U/L (ref 0–44)
AST: 25 U/L (ref 15–41)
Albumin: 2.5 g/dL — ABNORMAL LOW (ref 3.5–5.0)
Alkaline Phosphatase: 130 U/L — ABNORMAL HIGH (ref 38–126)
Anion gap: 11 (ref 5–15)
BUN: 40 mg/dL — ABNORMAL HIGH (ref 8–23)
CO2: 17 mmol/L — ABNORMAL LOW (ref 22–32)
Calcium: 7.5 mg/dL — ABNORMAL LOW (ref 8.9–10.3)
Chloride: 110 mmol/L (ref 98–111)
Creatinine, Ser: 2.57 mg/dL — ABNORMAL HIGH (ref 0.44–1.00)
GFR, Estimated: 17 mL/min — ABNORMAL LOW (ref >60)
Glucose, Bld: 113 mg/dL — ABNORMAL HIGH (ref 70–99)
Potassium: 3.4 mmol/L — ABNORMAL LOW (ref 3.5–5.1)
Sodium: 138 mmol/L (ref 135–145)
Total Bilirubin: 0.5 mg/dL (ref 0.3–1.2)
Total Protein: 5.5 g/dL — ABNORMAL LOW (ref 6.5–8.1)

## 2020-05-10 LAB — C-REACTIVE PROTEIN: CRP: 8.5 mg/dL — ABNORMAL HIGH (ref <1.0)

## 2020-05-10 LAB — FERRITIN: Ferritin: 161 ng/mL (ref 11–307)

## 2020-05-10 LAB — D-DIMER, QUANTITATIVE: D-Dimer, Quant: 3.2 ug/mL-FEU — ABNORMAL HIGH (ref 0.00–0.50)

## 2020-05-10 MED ORDER — ORAL CARE MOUTH RINSE
15.0000 mL | Freq: Two times a day (BID) | OROMUCOSAL | Status: DC
  Administered 2020-05-10: 02:00:00 15 mL via OROMUCOSAL

## 2020-05-10 NOTE — Discharge Summary (Signed)
Physician Discharge Summary  Leslie Coleman:[id number] DOB: 1928-08-05 DOA: 05/09/2020  PCP: System, Provider Not In  Admit date: 05/09/2020 Discharge date: 05/10/2020  Admitted From: Home Disposition: Home   Recommendations for Outpatient Follow-up:  1. Follow up with PCP in 1-2 weeks 2. Patient is at high risk of readmission due to persistent weakness and impaired functional mobility, though requires no continued inpatient management and declines consideration of SNF. She wants to be home with her husband who is on hospice. We are maximizing support at home and she has good family supports as well.   Home Health: PT, OT, RN, aide, CSW Equipment/Devices: None new Discharge Condition: Stable CODE STATUS: Full Diet recommendation: As tolerated   Brief/Interim Summary: This is an 86 year old female who is unvaccinated against COVID-19 with a past medical history of hypertension, hyperlipidemia, hypothyroidism, type 2 diabetes, V. tach, stress incontinence, CKD 4 who presented to the ED via EMS from home with shortness of breath and cough.  Patient recently tested positive for COVID-19 on 12/14 and was treated with monoclonal antibody on that day.  According to the daughter and son-in-law, the patient has had significant decline in her health over the past couple weeks especially since her recent COVID-19 diagnosis and has had failure to thrive type symptoms.  They have had difficulty taking care of her at home especially while having to take care of the patient's husband who is on hospice and has days to live.  They state that she has been having urinary and bowel incontinence recently as well and difficulty with ambulating and fell recently as well.  Since her Covid diagnosis she has had a productive cough but no fevers.  She has been taking azithromycin and dexamethasone since 12/15 without improvement.  All the symptoms are prompted family to bring the patient to the ED.  The patient  currently denies any symptoms.   ED Course: Afebrile, hemodynamically stable, on room air. Notable Labs: Sodium 137, K3.5, CO2 14 (chronically low), BUN 42, creatinine 2.56 (at baseline), WBC 11.0, Hb 9.5. Notable Imaging: Patchy left basilar airspace disease worrisome for pneumonia and is unchanged from previous CXR.  Per EDP the patient seems very weak and frail and not stable enough to go home and recommended that she be observed in the hospital.  Patient was hoping to go home as her husband is on his last days and hospice but is agreeable to staying in the hospital.  The patient was brought in for observation and has remained stable. She is incontinent and requiring significant assistance for bed level mobility. She and family decline SNF consideration.   Discharge Diagnoses:  Principal Problem:   Failure to thrive in adult Active Problems:   History of ventricular tachycardia   Type II diabetes mellitus (HCC)   CKD (chronic kidney disease) stage 4, GFR 15-29 ml/min ([place])   COVID-19   CAP (community acquired pneumonia)   Debility  Failure to thrive secondary to debility, COVID-19 infection:   - Tolerating a diet, does not require IVF.  - Continue maximal home health and aide with 24/7 supervision at home.   COVID-19: CAP is felt to be ruled out at the time of discharge with no significant pulmonary symptoms having developed and persistently negative procalcitonin.  - Completed 5 day course of abx.  - Though no hypoxia has developed, this may be due to previous steroids. CRP elevated. Continue decadron to complete 10 day course  History of VT: initially treated with procainamide and now  on amiodarone  T2DM: Well-controlled.  Mechanical fall: 24/7 supervision at home.  CKD IV, stable  Discharge Instructions Discharge Instructions    Diet - low sodium heart healthy   Complete by: As directed    Discharge instructions   Complete by: As directed    You are being  discharged from the hospital after treatment for covid-19 infection. You are felt to be stable enough to no longer require inpatient monitoring, testing, and treatment, though you will need to follow the recommendations below: - Continue taking decadron (you were prescribed a 10 day course and will just need to finish that). You have completed the course of antibiotics.  - Per CDC guidelines, you will need to remain in isolation for 21 days from your first positive covid test. - Follow up with your doctor in the next week via telehealth or seek medical attention right away if your symptoms get [place] are encouraged to get a covid vaccination between 21 days (after isolation period ends) and 90 days (before immunity is thought to wear off).  Directions for you at home:  Wear a facemask You should wear a facemask that covers your nose and mouth when you are in the same room with other people and when you visit a healthcare provider. People who live with or visit you should also wear a facemask while they are in the same room with you.  Separate yourself from other people in your home As much as possible, you should stay in a different room from other people in your home. Also, you should use a separate bathroom, if available.  Avoid sharing household items You should not share dishes, drinking glasses, cups, eating utensils, towels, bedding, or other items with other people in your home. After using these items, you should wash them thoroughly with soap and water.  Cover your coughs and sneezes Cover your mouth and nose with a tissue when you cough or sneeze, or you can cough or sneeze into your sleeve. Throw used tissues in a lined trash can, and immediately wash your hands with soap and water for at least 20 seconds or use an alcohol-based hand rub.  Wash your [facility] your hands often and thoroughly with soap and water for at least 20 seconds. You can use an alcohol-based  hand sanitizer if soap and water are not available and if your hands are not visibly dirty. Avoid touching your eyes, nose, and mouth with unwashed hands.  Directions for those who live with, or provide care at home for you:  Limit the number of people who have contact with the patient If possible, have only one caregiver for the patient. Other household members should stay in another home or place of residence. If this is not possible, they should stay in another room, or be separated from the patient as much as possible. Use a separate bathroom, if available. Restrict visitors who do not have an essential need to be in the home.  Ensure good ventilation Make sure that shared spaces in the home have good air flow, such as from an air conditioner or an opened window, weather permitting.  Wash your hands often Wash your hands often and thoroughly with soap and water for at least 20 seconds. You can use an alcohol based hand sanitizer if soap and water are not available and if your hands are not visibly dirty. Avoid touching your eyes, nose, and mouth with unwashed hands. Use disposable paper towels to dry  your hands. If not available, use dedicated cloth towels and replace them when they become wet.  Wear a facemask and gloves Wear a disposable facemask at all times in the room and gloves when you touch or have contact with the patient's blood, body fluids, and/or secretions or excretions, such as sweat, saliva, sputum, nasal mucus, vomit, urine, or feces.  Ensure the mask fits over your nose and mouth tightly, and do not touch it during use. Throw out disposable facemasks and gloves after using them. Do not reuse. Wash your hands immediately after removing your facemask and gloves. If your personal clothing becomes contaminated, carefully remove clothing and launder. Wash your hands after handling contaminated clothing. Place all used disposable facemasks, gloves, and other waste in a lined  container before disposing them with other household waste. Remove gloves and wash your hands immediately after handling these items.  Do not share dishes, glasses, or other household items with the patient Avoid sharing household items. You should not share dishes, drinking glasses, cups, eating utensils, towels, bedding, or other items with a patient who is confirmed to have, or being evaluated for, COVID-19 infection. After the person uses these items, you should wash them thoroughly with soap and water.  Wash laundry thoroughly Immediately remove and wash clothes or bedding that have blood, body fluids, and/or secretions or excretions, such as sweat, saliva, sputum, nasal mucus, vomit, urine, or feces, on them. Wear gloves when handling laundry from the patient. Read and follow directions on labels of laundry or clothing items and detergent. In general, wash and dry with the warmest temperatures recommended on the label.  Clean all areas the individual has used often Clean all touchable surfaces, such as counters, tabletops, doorknobs, bathroom fixtures, toilets, phones, keyboards, tablets, and bedside tables, every day. Also, clean any surfaces that may have blood, body fluids, and/or secretions or excretions on them. Wear gloves when cleaning surfaces the patient has come in contact with. Use a diluted bleach solution (e.g., dilute bleach with 1 part bleach and 10 parts water) or a household disinfectant with a label that says EPA-registered for coronaviruses. To make a bleach solution at home, add 1 tablespoon of bleach to 1 quart (4 cups) of water. For a larger supply, add  cup of bleach to 1 gallon (16 cups) of water. Read labels of cleaning products and follow recommendations provided on product labels. Labels contain instructions for safe and effective use of the cleaning product including precautions you should take when applying the product, such as wearing gloves or eye protection and  making sure you have good ventilation during use of the product. Remove gloves and wash hands immediately after cleaning.  Monitor yourself for signs and symptoms of illness Caregivers and household members are considered close contacts, should monitor their health, and will be asked to limit movement outside of the home to the extent possible. Follow the monitoring steps for close contacts listed on the symptom monitoring form.  If you have additional questions, contact your local health department or call the epidemiologist on call at [phone] (available 24/7). This guidance is subject to change. For the most up-to-date guidance from CDC, please refer to their website: YouBlogs.pl   Increase activity slowly   Complete by: As directed      Allergies as of 05/10/2020   No Known Allergies     Medication List    STOP taking these medications   azithromycin 250 MG tablet Commonly known as: ZESPQZRAQ  TAKE these medications   amiodarone 100 MG tablet Commonly known as: PACERONE Take 1 tablet by mouth every morning. What changed: when to take this   amLODipine 5 MG tablet Commonly known as: NORVASC Take 1 tablet by mouth daily.   Aspirin Low Dose 81 MG EC tablet Generic drug: aspirin Take 1 tablet by mouth daily. What changed: how much to take   dexamethasone 6 MG tablet Commonly known as: DECADRON Take 6 mg by mouth at bedtime. For 10 days   Eliquis 2.5 MG Tabs tablet Generic drug: apixaban Take 2.5 mg by mouth 2 (two) times daily.   furosemide 20 MG tablet Commonly known as: LASIX Take 20 mg by mouth daily.   glipiZIDE 5 MG 24 hr tablet Commonly known as: GLUCOTROL XL Take 1 tablet by mouth daily. What changed: when to take this   hydrALAZINE 50 MG tablet Commonly known as: APRESOLINE Take 1 tablet by mouth twice daily. What changed: when to take this   levothyroxine 100 MCG  tablet Commonly known as: SYNTHROID Take 1 tablet by mouth daily before breakfast. What changed: See the new instructions.   Melatonin 5 MG Caps Take 5 mg by mouth at bedtime.   multivitamin with minerals Tabs tablet Take 1 tablet by mouth daily.   omega-3 acid ethyl esters 1 g capsule Commonly known as: LOVAZA Take 2 capsules by mouth twice daily.   potassium chloride 10 MEQ tablet Commonly known as: KLOR-CON Take 1 tablet by mouth every morning. What changed: when to take this   pravastatin 20 MG tablet Commonly known as: PRAVACHOL Take 1 tablet by mouth at bedtime. What changed: when to take this   PROCRIT IJ Inject 10,000 Units as directed every 28 (twenty-eight) days.   pseudoephedrine-guaifenesin 60-600 MG 12 hr tablet Commonly known as: MUCINEX D Take 1 tablet by mouth every 12 (twelve) hours.       Follow-up Information    [name], MD Follow up.   Specialty: Internal Medicine Contact information: [address] [place] [address] [phone]              No Known Allergies  Consultations:  None  Procedures/Studies: CT Head Wo Contrast  Result Date: 04/29/2020 CLINICAL DATA:  Progressive weakness. EXAM: CT HEAD WITHOUT CONTRAST TECHNIQUE: Contiguous axial images were obtained from the base of the skull through the vertex without intravenous contrast. COMPARISON:  Sep 29, 2015 FINDINGS: Brain: There is mild to moderate severity cerebral atrophy with widening of the extra-axial spaces and ventricular dilatation. There are areas of decreased attenuation within the white matter tracts of the supratentorial brain, consistent with microvascular disease changes. Vascular: Areas of atherosclerotic calcification are again seen Skull: Negative for fracture or focal lesion. Hyperostosis is again seen. Sinuses/Orbits: No acute finding. Other: None. IMPRESSION: 1. Generalized cerebral atrophy. 2. No acute intracranial abnormality.  Electronically Signed   By: Virgina Norfolk M.D.   On: 04/29/2020 18:54   DG Chest Portable 1 View  Result Date: 05/09/2020 CLINICAL DATA:  Shortness of breath and cough. The patient tested positive for COVID-19 with a home test last week. EXAM: PORTABLE CHEST 1 VIEW COMPARISON:  Single-view of the chest 05/04/2020 and 04/29/2020. PA and lateral chest 10/19/2016. FINDINGS: There is some patchy airspace disease in the left lung base. A few calcified granulomata are seen in the right upper lobe. Right lung is otherwise clear. Heart size is normal. Aortic atherosclerosis. No pneumothorax or pleural effusion. IMPRESSION: No marked change in  patchy left basilar airspace disease worrisome for pneumonia since the most recent examination. Electronically Signed   By: Inge Rise M.D.   On: 05/09/2020 12:25   DG Chest Port 1 View  Result Date: 05/04/2020 CLINICAL DATA:  Cough.  Possible COVID. EXAM: PORTABLE CHEST 1 VIEW COMPARISON:  04/29/2020. FINDINGS: Mediastinum and hilar structures normal. Low lung volumes with mild bibasilar atelectasis/infiltrates. No pleural effusion or pneumothorax. Degenerative changes scoliosis thoracic spine. IMPRESSION: Low lung volumes with mild bibasilar atelectasis/infiltrates. Electronically Signed   By: Marcello Moores  Register   On: 05/04/2020 12:56   DG Chest Portable 1 View  Result Date: 04/29/2020 CLINICAL DATA:  Status post fall. EXAM: PORTABLE CHEST 1 VIEW COMPARISON:  Oct 19, 2016 FINDINGS: The heart size and mediastinal contours are within normal limits. There is moderate severity calcification of the thoracic aorta. Both lungs are clear. Degenerative changes seen throughout the thoracic spine. IMPRESSION: 1. No active disease. Electronically Signed   By: Virgina Norfolk M.D.   On: 04/29/2020 18:41   DG Hand Complete Left  Result Date: 04/29/2020 CLINICAL DATA:  Status post fall. EXAM: LEFT HAND - COMPLETE 3+ VIEW COMPARISON:  None. FINDINGS: A radiopaque watch is  seen overlying the distal left forearm, with a radiopaque ring overlying the proximal phalanx of the fourth left finger. These areas are subsequently limited evaluation. There is no evidence of an acute fracture or dislocation. Moderate severity degenerative changes are seen throughout the left wrist and along the carpometacarpal articulation of the left thumb. Additional degenerative changes seen throughout the interphalangeal joints of the left hand. Soft tissues are unremarkable. IMPRESSION: 1. No evidence of an acute fracture or dislocation. 2. Moderate severity degenerative changes, as described above. Electronically Signed   By: Virgina Norfolk M.D.   On: 04/29/2020 18:40   DG Hand Complete Right  Result Date: 04/29/2020 CLINICAL DATA:  Fall EXAM: RIGHT HAND - COMPLETE 3+ VIEW COMPARISON:  None. FINDINGS: Physiologic alignment with approximation of the joints. Diffuse joint space loss. Degenerative spurring, small geode formation and subchondral sclerosis predominantly involving the first [facility], first IP and 2/4th IP joints. Osteopenia. No fracture. Prominence of the soft tissues. Vascular calcifications. IMPRESSION: 1. No acute fracture or dislocation.  Osteopenia. 2. Mild-to-moderate osteoarthrosis. Electronically Signed   By: Primitivo Gauze M.D.   On: 04/29/2020 18:37      Subjective: Wants to go home. Will not consent to NH placement. No shortness of breath or pain anywhere.   Discharge Exam: Vitals:   05/10/20 0226 05/10/20 0605  BP: (!) 164/74 (!) 175/61  Pulse: 72 73  Resp: 18 (!) 24  Temp: 98 F (36.7 C) 98 F (36.7 C)  SpO2: 94% 98%   General: Elderly, chronically ill-appearing female Cardiovascular: RRR, no murmur or JVD Respiratory: Nonlabored, clear Abdominal: Soft, NT, ND, bowel sounds + Extremities: No edema, no cyanosis  Labs: BNP (last 3 results) No results for input(s): BNP in the last 8760 hours. Basic Metabolic Panel: Recent Labs  Lab 05/04/20 1315  05/09/20 1107 05/10/20 0320  NA 138 137 138  K 4.5 3.5 3.4*  CL 110 112* 110  CO2 19* 14* 17*  GLUCOSE 59* 118* 113*  BUN 26* 42* 40*  CREATININE 2.30* 2.56* 2.57*  CALCIUM 8.4* 7.8* 7.5*   Liver Function Tests: Recent Labs  Lab 05/04/20 1315 05/09/20 1330 05/10/20 0320  AST 27 28 25   ALT 14 19 17   ALKPHOS 157* 146* 130*  BILITOT 0.5 0.7 0.5  PROT 6.2* 6.2* 5.5*  ALBUMIN 3.0* 2.9* 2.5*   No results for input(s): LIPASE, AMYLASE in the last 168 hours. No results for input(s): AMMONIA in the last 168 hours. CBC: Recent Labs  Lab 05/04/20 1315 05/09/20 1107 05/10/20 0320  WBC 5.2 11.0* 7.0  NEUTROABS 3.5 8.1* PENDING  HGB 9.1* 9.5* 8.3*  HCT 27.7* 28.9* 25.7*  MCV 86.6 84.0 84.8  PLT 73* 99* 86*   Cardiac Enzymes: No results for input(s): CKTOTAL, CKMB, CKMBINDEX, TROPONINI in the last 168 hours. BNP: Invalid input(s): POCBNP CBG: No results for input(s): GLUCAP in the last 168 hours. D-Dimer Recent Labs    05/09/20 1330 05/10/20 0320  DDIMER 3.45* 3.20*   Hgb A1c No results for input(s): HGBA1C in the last 72 hours. Lipid Profile Recent Labs    05/09/20 1330  TRIG 287*   Thyroid function studies No results for input(s): TSH, T4TOTAL, T3FREE, THYROIDAB in the last 72 hours.  Invalid input(s): FREET3 Anemia work up Recent Labs    05/09/20 1330 05/10/20 0320  FERRITIN 177 161   Urinalysis    Component Value Date/Time   COLORURINE YELLOW 04/29/2020 2213   APPEARANCEUR CLOUDY (A) 04/29/2020 2213   LABSPEC 1.025 04/29/2020 2213   PHURINE 5.5 04/29/2020 2213   GLUCOSEU >=500 (A) 04/29/2020 2213   HGBUR LARGE (A) 04/29/2020 2213   [place] NEGATIVE 04/29/2020 2213   KETONESUR NEGATIVE 04/29/2020 2213   PROTEINUR >300 (A) 04/29/2020 2213   UROBILINOGEN 0.2 07/06/2011 2116   NITRITE NEGATIVE 04/29/2020 2213   LEUKOCYTESUR TRACE (A) 04/29/2020 2213    Microbiology Recent Results (from the past 240 hour(s))  Blood Culture (routine x 2)      Status: None   Collection Time: 05/04/20  1:12 PM   Specimen: BLOOD  Result Value Ref Range Status   Specimen Description   Final    BLOOD LEFT ANTECUBITAL Performed at [facility], [place] [address], [place], [place] [address]    Special Requests   Final    BOTTLES DRAWN AEROBIC AND ANAEROBIC Blood Culture adequate volume Performed at [place] [address], [place], [place] [address]    Culture   Final    NO GROWTH 5 DAYS Performed at [facility], [place] [address], [place], [place] [address]    Report Status 05/09/2020 FINAL  Final  Resp Panel by RT-PCR (Flu A&B, Covid) Nasopharyngeal Swab     Status: Abnormal   Collection Time: 05/04/20  1:15 PM   Specimen: Nasopharyngeal Swab; Nasopharyngeal(NP) swabs in vial transport medium  Result Value Ref Range Status   SARS Coronavirus 2 by RT PCR POSITIVE (A) NEGATIVE Final    Comment: RESULT CALLED TO, READ BACK BY AND VERIFIED WITH: P.DOWD AT 1422 ON 05/04/20 BY N.THOMPSON (NOTE) SARS-CoV-2 target nucleic acids are DETECTED.  The SARS-CoV-2 RNA is generally detectable in upper respiratory specimens during the acute phase of infection. Positive results are indicative of the presence of the identified virus, but do not rule out bacterial infection or co-infection with other pathogens not detected by the test. Clinical correlation with patient history and other diagnostic information is necessary to determine patient infection status. The expected result is Negative.  Fact Sheet for Patients: EntrepreneurPulse.com.au  Fact Sheet for Healthcare Providers: IncredibleEmployment.be  This test is not yet approved or cleared by the [place] FDA and  has been authorized for detection and/or diagnosis of SARS-CoV-2 by FDA under an Emergency Use Authorization (EUA).  This EUA will remain in effect (  meaning this test  can be used) for the duration of   the COVID-19 declaration under Section 564(b)(1) of the Act, 21 U.S.C. section 360bbb-3(b)(1), unless the authorization is terminated or revoked sooner.     Influenza A by PCR NEGATIVE NEGATIVE Final   Influenza B by PCR NEGATIVE NEGATIVE Final    Comment: (NOTE) The Xpert Xpress SARS-CoV-2/FLU/RSV plus assay is intended as an aid in the diagnosis of influenza from Nasopharyngeal swab specimens and should not be used as a sole basis for treatment. Nasal washings and aspirates are unacceptable for Xpert Xpress SARS-CoV-2/FLU/RSV testing.  Fact Sheet for Patients: EntrepreneurPulse.com.au  Fact Sheet for Healthcare Providers: IncredibleEmployment.be  This test is not yet approved or cleared by the [place] FDA and has been authorized for detection and/or diagnosis of SARS-CoV-2 by FDA under an Emergency Use Authorization (EUA). This EUA will remain in effect (meaning this test can be used) for the duration of the COVID-19 declaration under Section 564(b)(1) of the Act, 21 U.S.C. section 360bbb-3(b)(1), unless the authorization is terminated or revoked.  Performed at [facility], [place] [address] [place], [place] [address]   Blood Culture (routine x 2)     Status: None   Collection Time: 05/04/20  1:15 PM   Specimen: BLOOD  Result Value Ref Range Status   Specimen Description   Final    BLOOD RIGHT ANTECUBITAL Performed at [place] [address], [place], [place] [address]    Special Requests   Final    BOTTLES DRAWN AEROBIC AND ANAEROBIC Blood Culture adequate volume Performed at [place] [address] [place], [place] [address]    Culture   Final    NO GROWTH 5 DAYS Performed at [facility], [place] [address], [place], [place] [address]    Report Status 05/09/2020 FINAL  Final  Urine culture     Status: Abnormal   Collection Time: 05/04/20  1:15 PM    Specimen: Urine, Random  Result Value Ref Range Status   Specimen Description   Final    URINE, RANDOM Performed at [place] [address] [place], [place] [address]    Special Requests   Final    NONE Performed at [facility], [place] [address] [place], [place] [address]    Culture (A)  Final    <10,000 COLONIES/mL INSIGNIFICANT GROWTH Performed at [place] [address] [place], [place] [address]    Report Status 05/05/2020 FINAL  Final  Blood Culture (routine x 2)     Status: None (Preliminary result)   Collection Time: 05/09/20  1:35 PM   Specimen: BLOOD  Result Value Ref Range Status   Specimen Description   Final    BLOOD RIGHT ARM Performed at [place] [address] [place], [place] [address]    Special Requests   Final    BOTTLES DRAWN AEROBIC AND ANAEROBIC Blood Culture adequate volume Performed at [place] [address], [place], [place] [address]    Culture   Final    NO GROWTH < 24 HOURS Performed at [place] [address] [place], [place] [address]    Report Status PENDING  Incomplete    Time coordinating discharge: Approximately 40 minutes  [place] Pour, MD  [facility] 05/10/2020, 10:53 AM

## 2020-05-10 NOTE — Progress Notes (Signed)
Spoke w/ pt daughter over the phone. Went over discharge instructions w/ Annamarie Dawley (pt daughter).

## 2020-05-10 NOTE — Evaluation (Signed)
Physical Therapy Evaluation Patient Details Name: Leslie Coleman MRN: [id number] DOB: 14-Dec-1928 Today's Date: 05/10/2020   History of Present Illness  86 year old female who is unvaccinated against COVID-19 with a past medical history of hypertension, hyperlipidemia, hypothyroidism, type 2 diabetes, V. tach, stress incontinence, CKD 4 who presented to the ED via EMS from home with shortness of breath and cough.  Patient recently tested positive for COVID-19 on 12/14 and was treated with monoclonal antibody on that day.  Clinical Impression  Pt admitted with above diagnosis.  See below for mobility details. Would strongly recommend SNF however since pt husband is on Hospice care the family understandably wants to take her home. Will need HHPT and 24 hour assist   Pt currently with functional limitations due to the deficits listed below (see PT Problem List). Pt will benefit from skilled PT to increase their independence and safety with mobility to allow discharge to the venue listed below.       Follow Up Recommendations SNF;Home health PT;Supervision/Assistance - 24 hour (family taking pt home d/t husband on hospice)    Equipment Recommendations  None recommended by PT    Recommendations for Other Services       Precautions / Restrictions Precautions Precautions: Fall      Mobility  Bed Mobility Overal bed mobility: Needs Assistance Bed Mobility: Supine to Sit     Supine to sit: HOB elevated;Mod assist     General bed mobility comments: assist with LEs and trunk to upright    Transfers Overall transfer level: Needs assistance Equipment used: Rolling walker (2 wheeled) Transfers: Squat Pivot Transfers     Squat pivot transfers: Max assist     General transfer comment: unable to stand, heavy posterior bias. squat pivot to chair with max assist and multi-modal cues  Ambulation/Gait             General Gait Details: unable  Stairs            Wheelchair  Mobility    Modified Rankin (Stroke Patients Only)       Balance Overall balance assessment: Needs assistance Sitting-balance support: Feet supported;Bilateral upper extremity supported Sitting balance-Leahy Scale: Fair       Standing balance-Leahy Scale: Zero                               Pertinent Vitals/Pain Pain Assessment: No/denies pain    Home Living Family/patient expects to be discharged to:: Private residence Living Arrangements: Spouse/significant other;Children Available Help at Discharge: Family;Available PRN/intermittently Type of Home: House Home Access: Ramped entrance     Home Layout: One level Home Equipment: Walker - 2 wheels      Prior Function Level of Independence: Independent with assistive device(s)         Comments: amb with RW prior to admission per pt report.  info above taken from previous admissions     Hand Dominance        Extremity/Trunk Assessment   Upper Extremity Assessment Upper Extremity Assessment: Generalized weakness    Lower Extremity Assessment Lower Extremity Assessment: Generalized weakness       Communication   Communication: No difficulties  Cognition Arousal/Alertness: Awake/alert Behavior During Therapy: Flat affect Overall Cognitive Status: No family/caregiver present to determine baseline cognitive functioning Area of Impairment: Attention;Following commands;Problem solving                   Current Attention Level: Sustained  Following Commands: Follows one step commands consistently;Follows multi-step commands inconsistently     Problem Solving: Slow processing;Decreased initiation;Difficulty sequencing;Requires verbal cues;Requires tactile cues General Comments: pt intermittently confused, asking PT "where's your baby!?" unaware of incontinence      General Comments      Exercises     Assessment/Plan    PT Assessment Patient needs continued PT services  PT  Problem List Decreased strength;Decreased mobility;Decreased range of motion;Decreased activity tolerance;Decreased knowledge of use of DME       PT Treatment Interventions DME instruction;Therapeutic activities;Functional mobility training;Therapeutic exercise;Patient/family education;Gait training;Balance training    PT Goals (Current goals can be found in the Care Plan section)  Acute Rehab PT Goals Patient Stated Goal: pt does not state PT Goal Formulation: With patient Time For Goal Achievement: 05/24/20 Potential to Achieve Goals: Fair    Frequency     Barriers to discharge        Co-evaluation               AM-PAC PT "6 Clicks" Mobility  Outcome Measure Help needed turning from your back to your side while in a flat bed without using bedrails?: A Lot Help needed moving from lying on your back to sitting on the side of a flat bed without using bedrails?: A Lot Help needed moving to and from a bed to a chair (including a wheelchair)?: Total Help needed standing up from a chair using your arms (e.g., wheelchair or bedside chair)?: Total Help needed to walk in hospital room?: Total Help needed climbing 3-5 steps with a railing? : Total 6 Click Score: 8    End of Session Equipment Utilized During Treatment: Gait belt Activity Tolerance: Patient limited by fatigue Patient left: in chair;with call bell/phone within reach;with chair alarm set Nurse Communication: Mobility status PT Visit Diagnosis: Other abnormalities of gait and mobility (R26.89)    Time: 0712-1975 PT Time Calculation (min) (ACUTE ONLY): 16 min   Charges:   PT Evaluation $PT Eval Low Complexity: Bertie, PT  Acute Rehab Dept ([place]) [phone] Pager [phone]  05/10/2020   [facility] 05/10/2020, 4:05 PM

## 2020-05-10 NOTE — TOC Transition Note (Signed)
Transition of Care [facility]) - CM/SW Discharge Note   Patient Details  Name: Leslie Coleman MRN: [id number] Date of Birth: 1928-09-16  [facility] CM/SW Contact:  Trish Mage, LCSW Phone Number: 05/10/2020, 2:26 PM   Clinical Narrative:   Spoke with Annamarie Dawley (Daughter) [phone], who confirmed that they want to bring patient home ASAP so that she can be with her dying husband, with whom hospice is working at home.  Family has a nurse there during the day, and are planning on bringing in someone to stay overnight as soon as it can be arranged.  Ms Tammi Klippel reports that [facility] is involved, and I called them to let them know patient is returning home today, and that [facility] will be providing [facility] services.  No DME is needed.  She and her husband will pick up patient as soon as d/c is ready.  Nurse alerted.  Spoke with Ramond Marrow at [facility] [employer] who agrees to pick up patient for [facility] services.  No further needs identified. TOC sign off.     Final next level of care: [place] Barriers to Discharge: No Barriers Identified   Patient Goals and CMS Choice        Discharge Placement                       Discharge Plan and Services                            [place]: [place] ([facility]) Date [facility] Agency Contacted: 05/10/20 Time [place]: 42 Representative spoke with at [place]: [place] Determinants of Health ([place]) Interventions     Readmission Risk Interventions No flowsheet data found.

## 2020-05-11 DIAGNOSIS — Z7984 Long term (current) use of oral hypoglycemic drugs: Secondary | ICD-10-CM | POA: Diagnosis not present

## 2020-05-11 DIAGNOSIS — D631 Anemia in chronic kidney disease: Secondary | ICD-10-CM | POA: Diagnosis not present

## 2020-05-11 DIAGNOSIS — R159 Full incontinence of feces: Secondary | ICD-10-CM | POA: Diagnosis not present

## 2020-05-11 DIAGNOSIS — E1122 Type 2 diabetes mellitus with diabetic chronic kidney disease: Secondary | ICD-10-CM | POA: Diagnosis not present

## 2020-05-11 DIAGNOSIS — M109 Gout, unspecified: Secondary | ICD-10-CM | POA: Diagnosis not present

## 2020-05-11 DIAGNOSIS — N184 Chronic kidney disease, stage 4 (severe): Secondary | ICD-10-CM | POA: Diagnosis not present

## 2020-05-11 DIAGNOSIS — E039 Hypothyroidism, unspecified: Secondary | ICD-10-CM | POA: Diagnosis not present

## 2020-05-11 DIAGNOSIS — E669 Obesity, unspecified: Secondary | ICD-10-CM | POA: Diagnosis not present

## 2020-05-11 DIAGNOSIS — Z8781 Personal history of (healed) traumatic fracture: Secondary | ICD-10-CM | POA: Diagnosis not present

## 2020-05-11 DIAGNOSIS — Z9181 History of falling: Secondary | ICD-10-CM | POA: Diagnosis not present

## 2020-05-11 DIAGNOSIS — Z7901 Long term (current) use of anticoagulants: Secondary | ICD-10-CM | POA: Diagnosis not present

## 2020-05-11 DIAGNOSIS — E785 Hyperlipidemia, unspecified: Secondary | ICD-10-CM | POA: Diagnosis not present

## 2020-05-11 DIAGNOSIS — I129 Hypertensive chronic kidney disease with stage 1 through stage 4 chronic kidney disease, or unspecified chronic kidney disease: Secondary | ICD-10-CM | POA: Diagnosis not present

## 2020-05-11 DIAGNOSIS — I472 Ventricular tachycardia: Secondary | ICD-10-CM | POA: Diagnosis not present

## 2020-05-11 DIAGNOSIS — U071 COVID-19: Secondary | ICD-10-CM | POA: Diagnosis not present

## 2020-05-11 DIAGNOSIS — N393 Stress incontinence (female) (male): Secondary | ICD-10-CM | POA: Diagnosis not present

## 2020-05-14 LAB — CULTURE, BLOOD (ROUTINE X 2)
Culture: NO GROWTH
Special Requests: ADEQUATE

## 2020-05-15 DIAGNOSIS — E1165 Type 2 diabetes mellitus with hyperglycemia: Secondary | ICD-10-CM | POA: Diagnosis not present

## 2020-05-15 DIAGNOSIS — I10 Essential (primary) hypertension: Secondary | ICD-10-CM | POA: Diagnosis not present

## 2020-05-16 DIAGNOSIS — N184 Chronic kidney disease, stage 4 (severe): Secondary | ICD-10-CM | POA: Diagnosis not present

## 2020-05-16 DIAGNOSIS — E1122 Type 2 diabetes mellitus with diabetic chronic kidney disease: Secondary | ICD-10-CM | POA: Diagnosis not present

## 2020-05-16 DIAGNOSIS — E785 Hyperlipidemia, unspecified: Secondary | ICD-10-CM | POA: Diagnosis not present

## 2020-05-16 DIAGNOSIS — I129 Hypertensive chronic kidney disease with stage 1 through stage 4 chronic kidney disease, or unspecified chronic kidney disease: Secondary | ICD-10-CM | POA: Diagnosis not present

## 2020-05-16 DIAGNOSIS — D631 Anemia in chronic kidney disease: Secondary | ICD-10-CM | POA: Diagnosis not present

## 2020-05-16 DIAGNOSIS — U071 COVID-19: Secondary | ICD-10-CM | POA: Diagnosis not present

## 2020-05-17 DIAGNOSIS — I129 Hypertensive chronic kidney disease with stage 1 through stage 4 chronic kidney disease, or unspecified chronic kidney disease: Secondary | ICD-10-CM | POA: Diagnosis not present

## 2020-05-17 DIAGNOSIS — U071 COVID-19: Secondary | ICD-10-CM | POA: Diagnosis not present

## 2020-05-17 DIAGNOSIS — E1122 Type 2 diabetes mellitus with diabetic chronic kidney disease: Secondary | ICD-10-CM | POA: Diagnosis not present

## 2020-05-17 DIAGNOSIS — D631 Anemia in chronic kidney disease: Secondary | ICD-10-CM | POA: Diagnosis not present

## 2020-05-17 DIAGNOSIS — N184 Chronic kidney disease, stage 4 (severe): Secondary | ICD-10-CM | POA: Diagnosis not present

## 2020-05-17 DIAGNOSIS — E785 Hyperlipidemia, unspecified: Secondary | ICD-10-CM | POA: Diagnosis not present

## 2020-05-18 DIAGNOSIS — E785 Hyperlipidemia, unspecified: Secondary | ICD-10-CM | POA: Diagnosis not present

## 2020-05-18 DIAGNOSIS — N184 Chronic kidney disease, stage 4 (severe): Secondary | ICD-10-CM | POA: Diagnosis not present

## 2020-05-18 DIAGNOSIS — I10 Essential (primary) hypertension: Secondary | ICD-10-CM | POA: Diagnosis not present

## 2020-05-18 DIAGNOSIS — U071 COVID-19: Secondary | ICD-10-CM | POA: Diagnosis not present

## 2020-05-18 DIAGNOSIS — I129 Hypertensive chronic kidney disease with stage 1 through stage 4 chronic kidney disease, or unspecified chronic kidney disease: Secondary | ICD-10-CM | POA: Diagnosis not present

## 2020-05-18 DIAGNOSIS — R059 Cough, unspecified: Secondary | ICD-10-CM | POA: Diagnosis not present

## 2020-05-18 DIAGNOSIS — E1122 Type 2 diabetes mellitus with diabetic chronic kidney disease: Secondary | ICD-10-CM | POA: Diagnosis not present

## 2020-05-18 DIAGNOSIS — D631 Anemia in chronic kidney disease: Secondary | ICD-10-CM | POA: Diagnosis not present

## 2020-05-20 ENCOUNTER — Emergency Department (HOSPITAL_COMMUNITY): Payer: Medicare Other

## 2020-05-20 ENCOUNTER — Encounter (HOSPITAL_COMMUNITY): Payer: Self-pay

## 2020-05-20 ENCOUNTER — Inpatient Hospital Stay (HOSPITAL_COMMUNITY)
Admission: EM | Admit: 2020-05-20 | Discharge: 2020-06-22 | DRG: 177 | Disposition: E | Payer: Medicare Other | Attending: Internal Medicine | Admitting: Internal Medicine

## 2020-05-20 ENCOUNTER — Other Ambulatory Visit: Payer: Self-pay

## 2020-05-20 DIAGNOSIS — Z7982 Long term (current) use of aspirin: Secondary | ICD-10-CM

## 2020-05-20 DIAGNOSIS — I472 Ventricular tachycardia: Secondary | ICD-10-CM | POA: Diagnosis present

## 2020-05-20 DIAGNOSIS — J189 Pneumonia, unspecified organism: Secondary | ICD-10-CM | POA: Diagnosis present

## 2020-05-20 DIAGNOSIS — Z7984 Long term (current) use of oral hypoglycemic drugs: Secondary | ICD-10-CM

## 2020-05-20 DIAGNOSIS — R5381 Other malaise: Secondary | ICD-10-CM | POA: Diagnosis present

## 2020-05-20 DIAGNOSIS — R531 Weakness: Secondary | ICD-10-CM | POA: Diagnosis not present

## 2020-05-20 DIAGNOSIS — M109 Gout, unspecified: Secondary | ICD-10-CM | POA: Diagnosis present

## 2020-05-20 DIAGNOSIS — I129 Hypertensive chronic kidney disease with stage 1 through stage 4 chronic kidney disease, or unspecified chronic kidney disease: Secondary | ICD-10-CM | POA: Diagnosis present

## 2020-05-20 DIAGNOSIS — E1122 Type 2 diabetes mellitus with diabetic chronic kidney disease: Secondary | ICD-10-CM | POA: Diagnosis present

## 2020-05-20 DIAGNOSIS — N184 Chronic kidney disease, stage 4 (severe): Secondary | ICD-10-CM | POA: Diagnosis present

## 2020-05-20 DIAGNOSIS — N393 Stress incontinence (female) (male): Secondary | ICD-10-CM | POA: Diagnosis present

## 2020-05-20 DIAGNOSIS — Z8679 Personal history of other diseases of the circulatory system: Secondary | ICD-10-CM

## 2020-05-20 DIAGNOSIS — Z79899 Other long term (current) drug therapy: Secondary | ICD-10-CM

## 2020-05-20 DIAGNOSIS — R0602 Shortness of breath: Secondary | ICD-10-CM | POA: Diagnosis not present

## 2020-05-20 DIAGNOSIS — Z79890 Hormone replacement therapy (postmenopausal): Secondary | ICD-10-CM

## 2020-05-20 DIAGNOSIS — Z66 Do not resuscitate: Secondary | ICD-10-CM | POA: Diagnosis present

## 2020-05-20 DIAGNOSIS — Z515 Encounter for palliative care: Secondary | ICD-10-CM | POA: Diagnosis not present

## 2020-05-20 DIAGNOSIS — E860 Dehydration: Secondary | ICD-10-CM | POA: Diagnosis present

## 2020-05-20 DIAGNOSIS — U071 COVID-19: Principal | ICD-10-CM | POA: Diagnosis present

## 2020-05-20 DIAGNOSIS — R627 Adult failure to thrive: Secondary | ICD-10-CM | POA: Diagnosis present

## 2020-05-20 DIAGNOSIS — J9601 Acute respiratory failure with hypoxia: Secondary | ICD-10-CM | POA: Diagnosis present

## 2020-05-20 DIAGNOSIS — R0600 Dyspnea, unspecified: Secondary | ICD-10-CM | POA: Diagnosis not present

## 2020-05-20 DIAGNOSIS — J1282 Pneumonia due to coronavirus disease 2019: Secondary | ICD-10-CM | POA: Diagnosis present

## 2020-05-20 DIAGNOSIS — Z7901 Long term (current) use of anticoagulants: Secondary | ICD-10-CM

## 2020-05-20 DIAGNOSIS — I248 Other forms of acute ischemic heart disease: Secondary | ICD-10-CM | POA: Diagnosis present

## 2020-05-20 DIAGNOSIS — Z7952 Long term (current) use of systemic steroids: Secondary | ICD-10-CM | POA: Diagnosis not present

## 2020-05-20 DIAGNOSIS — I491 Atrial premature depolarization: Secondary | ICD-10-CM | POA: Diagnosis not present

## 2020-05-20 DIAGNOSIS — Z8249 Family history of ischemic heart disease and other diseases of the circulatory system: Secondary | ICD-10-CM | POA: Diagnosis not present

## 2020-05-20 DIAGNOSIS — E785 Hyperlipidemia, unspecified: Secondary | ICD-10-CM | POA: Diagnosis present

## 2020-05-20 DIAGNOSIS — E782 Mixed hyperlipidemia: Secondary | ICD-10-CM

## 2020-05-20 DIAGNOSIS — E039 Hypothyroidism, unspecified: Secondary | ICD-10-CM | POA: Diagnosis present

## 2020-05-20 DIAGNOSIS — Z7189 Other specified counseling: Secondary | ICD-10-CM | POA: Diagnosis not present

## 2020-05-20 DIAGNOSIS — I10 Essential (primary) hypertension: Secondary | ICD-10-CM | POA: Diagnosis not present

## 2020-05-20 DIAGNOSIS — R410 Disorientation, unspecified: Secondary | ICD-10-CM | POA: Diagnosis not present

## 2020-05-20 DIAGNOSIS — E1165 Type 2 diabetes mellitus with hyperglycemia: Secondary | ICD-10-CM | POA: Diagnosis not present

## 2020-05-20 LAB — COMPREHENSIVE METABOLIC PANEL
ALT: 29 U/L (ref 0–44)
AST: 35 U/L (ref 15–41)
Albumin: 2.5 g/dL — ABNORMAL LOW (ref 3.5–5.0)
Alkaline Phosphatase: 181 U/L — ABNORMAL HIGH (ref 38–126)
Anion gap: 10 (ref 5–15)
BUN: 44 mg/dL — ABNORMAL HIGH (ref 8–23)
CO2: 14 mmol/L — ABNORMAL LOW (ref 22–32)
Calcium: 8 mg/dL — ABNORMAL LOW (ref 8.9–10.3)
Chloride: 111 mmol/L (ref 98–111)
Creatinine, Ser: 2.67 mg/dL — ABNORMAL HIGH (ref 0.44–1.00)
GFR, Estimated: 17 mL/min — ABNORMAL LOW (ref >60)
Glucose, Bld: 273 mg/dL — ABNORMAL HIGH (ref 70–99)
Potassium: 4.3 mmol/L (ref 3.5–5.1)
Sodium: 135 mmol/L (ref 135–145)
Total Bilirubin: 0.9 mg/dL (ref 0.3–1.2)
Total Protein: 6.1 g/dL — ABNORMAL LOW (ref 6.5–8.1)

## 2020-05-20 LAB — TROPONIN I (HIGH SENSITIVITY)
Troponin I (High Sensitivity): 27 ng/L — ABNORMAL HIGH (ref <18)
Troponin I (High Sensitivity): 31 ng/L — ABNORMAL HIGH (ref <18)

## 2020-05-20 LAB — CBC WITH DIFFERENTIAL/PLATELET
Abs Immature Granulocytes: 0.09 10*3/uL — ABNORMAL HIGH (ref 0.00–0.07)
Basophils Absolute: 0 10*3/uL (ref 0.0–0.1)
Basophils Relative: 0 %
Eosinophils Absolute: 0 10*3/uL (ref 0.0–0.5)
Eosinophils Relative: 0 %
HCT: 27.4 % — ABNORMAL LOW (ref 36.0–46.0)
Hemoglobin: 9 g/dL — ABNORMAL LOW (ref 12.0–15.0)
Immature Granulocytes: 1 %
Lymphocytes Relative: 6 %
Lymphs Abs: 0.9 10*3/uL (ref 0.7–4.0)
MCH: 27.7 pg (ref 26.0–34.0)
MCHC: 32.8 g/dL (ref 30.0–36.0)
MCV: 84.3 fL (ref 80.0–100.0)
Monocytes Absolute: 0.3 10*3/uL (ref 0.1–1.0)
Monocytes Relative: 2 %
Neutro Abs: 15.7 10*3/uL — ABNORMAL HIGH (ref 1.7–7.7)
Neutrophils Relative %: 91 %
Platelets: 112 10*3/uL — ABNORMAL LOW (ref 150–400)
RBC: 3.25 MIL/uL — ABNORMAL LOW (ref 3.87–5.11)
RDW: 17.3 % — ABNORMAL HIGH (ref 11.5–15.5)
WBC: 17 10*3/uL — ABNORMAL HIGH (ref 4.0–10.5)
nRBC: 0 % (ref 0.0–0.2)

## 2020-05-20 LAB — FIBRINOGEN: Fibrinogen: 631 mg/dL — ABNORMAL HIGH (ref 210–475)

## 2020-05-20 LAB — CBG MONITORING, ED
Glucose-Capillary: 355 mg/dL — ABNORMAL HIGH (ref 70–99)
Glucose-Capillary: 328 mg/dL — ABNORMAL HIGH (ref 70–99)

## 2020-05-20 LAB — LACTIC ACID, PLASMA: Lactic Acid, Venous: 1.6 mmol/L (ref 0.5–1.9)

## 2020-05-20 LAB — LACTATE DEHYDROGENASE: LDH: 269 U/L — ABNORMAL HIGH (ref 98–192)

## 2020-05-20 LAB — PROCALCITONIN: Procalcitonin: 0.14 ng/mL

## 2020-05-20 LAB — BRAIN NATRIURETIC PEPTIDE: B Natriuretic Peptide: 300.7 pg/mL — ABNORMAL HIGH (ref 0.0–100.0)

## 2020-05-20 LAB — FERRITIN: Ferritin: 380 ng/mL — ABNORMAL HIGH (ref 11–307)

## 2020-05-20 LAB — D-DIMER, QUANTITATIVE: D-Dimer, Quant: 3.31 ug/mL-FEU — ABNORMAL HIGH (ref 0.00–0.50)

## 2020-05-20 LAB — TRIGLYCERIDES: Triglycerides: 217 mg/dL — ABNORMAL HIGH (ref <150)

## 2020-05-20 LAB — C-REACTIVE PROTEIN: CRP: 17.7 mg/dL — ABNORMAL HIGH (ref <1.0)

## 2020-05-20 MED ORDER — AMLODIPINE BESYLATE 5 MG PO TABS
5.0000 mg | ORAL_TABLET | Freq: Every day | ORAL | Status: DC
  Filled 2020-05-20 (×2): qty 1

## 2020-05-20 MED ORDER — ACETAMINOPHEN 325 MG PO TABS
650.0000 mg | ORAL_TABLET | Freq: Four times a day (QID) | ORAL | Status: DC | PRN
  Administered 2020-05-21 – 2020-05-22 (×3): 650 mg via ORAL
  Filled 2020-05-20 (×3): qty 2

## 2020-05-20 MED ORDER — SODIUM CHLORIDE 0.9 % IV SOLN
200.0000 mg | Freq: Once | INTRAVENOUS | Status: AC
  Administered 2020-05-20: 200 mg via INTRAVENOUS
  Filled 2020-05-20: qty 200

## 2020-05-20 MED ORDER — CEFTRIAXONE SODIUM 2 G IJ SOLR
2.0000 g | INTRAMUSCULAR | Status: DC
  Administered 2020-05-21: 2 g via INTRAVENOUS
  Filled 2020-05-20: qty 1
  Filled 2020-05-20: qty 20

## 2020-05-20 MED ORDER — ASPIRIN EC 81 MG PO TBEC
81.0000 mg | DELAYED_RELEASE_TABLET | Freq: Every day | ORAL | Status: DC
  Filled 2020-05-20 (×2): qty 1

## 2020-05-20 MED ORDER — FUROSEMIDE 40 MG PO TABS
20.0000 mg | ORAL_TABLET | Freq: Every day | ORAL | Status: DC
  Filled 2020-05-20 (×2): qty 1

## 2020-05-20 MED ORDER — METHYLPREDNISOLONE SODIUM SUCC 40 MG IJ SOLR
0.5000 mg/kg | Freq: Two times a day (BID) | INTRAMUSCULAR | Status: AC
  Administered 2020-05-20 – 2020-05-23 (×6): 34.4 mg via INTRAVENOUS
  Filled 2020-05-20 (×6): qty 1

## 2020-05-20 MED ORDER — SODIUM CHLORIDE 0.9 % IV SOLN
1.0000 g | Freq: Once | INTRAVENOUS | Status: AC
  Administered 2020-05-20: 16:00:00 1 g via INTRAVENOUS
  Filled 2020-05-20: qty 10

## 2020-05-20 MED ORDER — HYDRALAZINE HCL 50 MG PO TABS
50.0000 mg | ORAL_TABLET | Freq: Two times a day (BID) | ORAL | Status: DC
  Administered 2020-05-21: 50 mg via ORAL
  Filled 2020-05-20 (×4): qty 1

## 2020-05-20 MED ORDER — IPRATROPIUM-ALBUTEROL 20-100 MCG/ACT IN AERS
1.0000 | INHALATION_SPRAY | Freq: Four times a day (QID) | RESPIRATORY_TRACT | Status: DC
  Administered 2020-05-21 – 2020-05-22 (×4): 1 via RESPIRATORY_TRACT
  Filled 2020-05-20 (×2): qty 4

## 2020-05-20 MED ORDER — AZITHROMYCIN 500 MG IV SOLR
500.0000 mg | INTRAVENOUS | Status: DC
  Administered 2020-05-21: 500 mg via INTRAVENOUS
  Filled 2020-05-20 (×2): qty 500

## 2020-05-20 MED ORDER — SODIUM CHLORIDE 0.9 % IV SOLN
500.0000 mg | Freq: Once | INTRAVENOUS | Status: AC
  Administered 2020-05-20: 16:00:00 500 mg via INTRAVENOUS
  Filled 2020-05-20: qty 500

## 2020-05-20 MED ORDER — PREDNISONE 20 MG PO TABS: 50.0000 mg | ORAL_TABLET | Freq: Every day | ORAL | Status: DC

## 2020-05-20 MED ORDER — SODIUM CHLORIDE 0.9 % IV SOLN: INTRAVENOUS | Status: AC

## 2020-05-20 MED ORDER — PRAVASTATIN SODIUM 20 MG PO TABS
20.0000 mg | ORAL_TABLET | Freq: Every day | ORAL | Status: DC
  Filled 2020-05-20 (×2): qty 1

## 2020-05-20 MED ORDER — MELATONIN 5 MG PO TABS
5.0000 mg | ORAL_TABLET | Freq: Every day | ORAL | Status: DC
  Administered 2020-05-21: 5 mg via ORAL
  Filled 2020-05-20 (×2): qty 1

## 2020-05-20 MED ORDER — AMIODARONE HCL 100 MG PO TABS
100.0000 mg | ORAL_TABLET | Freq: Every day | ORAL | Status: DC
  Filled 2020-05-20 (×5): qty 1

## 2020-05-20 MED ORDER — SODIUM CHLORIDE 0.9 % IV SOLN
100.0000 mg | Freq: Every day | INTRAVENOUS | Status: DC
  Administered 2020-05-21 – 2020-05-22 (×2): 100 mg via INTRAVENOUS
  Filled 2020-05-20 (×2): qty 20

## 2020-05-20 MED ORDER — APIXABAN 2.5 MG PO TABS
2.5000 mg | ORAL_TABLET | Freq: Two times a day (BID) | ORAL | Status: DC
  Administered 2020-05-21: 2.5 mg via ORAL
  Filled 2020-05-20 (×5): qty 1

## 2020-05-20 MED ORDER — INSULIN ASPART 100 UNIT/ML ~~LOC~~ SOLN
0.0000 [IU] | Freq: Three times a day (TID) | SUBCUTANEOUS | Status: DC
  Administered 2020-05-20: 20:00:00 15 [IU] via SUBCUTANEOUS
  Administered 2020-05-21: 5 [IU] via SUBCUTANEOUS
  Administered 2020-05-21: 8 [IU] via SUBCUTANEOUS
  Administered 2020-05-21: 5 [IU] via SUBCUTANEOUS
  Filled 2020-05-20: qty 0.15

## 2020-05-20 MED ORDER — GUAIFENESIN-DM 100-10 MG/5ML PO SYRP
10.0000 mL | ORAL_SOLUTION | ORAL | Status: DC | PRN
  Administered 2020-05-21 – 2020-05-22 (×3): 10 mL via ORAL
  Filled 2020-05-20 (×3): qty 10

## 2020-05-20 MED ORDER — LEVOTHYROXINE SODIUM 100 MCG PO TABS
100.0000 ug | ORAL_TABLET | Freq: Every day | ORAL | Status: DC
  Administered 2020-05-21: 100 ug via ORAL
  Filled 2020-05-20 (×2): qty 1

## 2020-05-20 MED ORDER — SODIUM CHLORIDE 0.9% FLUSH
3.0000 mL | Freq: Two times a day (BID) | INTRAVENOUS | Status: DC
  Administered 2020-05-21 – 2020-05-25 (×9): 3 mL via INTRAVENOUS

## 2020-05-20 NOTE — Progress Notes (Signed)
Manufacturing engineer [facility]) Hospice  Hospice was to enroll Mrs. Gavin today for hospice services. When our RN arrived, the family was wavering on hospice in light of her husband passing recently.  Family decided to send Mrs. Wissinger to the hospital for further treatments, if possible.  Mrs. Carda is not under hospice care at this time.  [facility] will follow for discharge planning and anticipate starting hospice when she d/c's.  Venia Carbon RN, BSN, [place] [facility] Liaison

## 2020-05-20 NOTE — H&P (Signed)
History and Physical        Hospital Admission Note Date: 04/24/2020  Patient name: Leslie Coleman Medical record number: [id number] Date of birth: Dec 29, 1928 Age: 86 y.o. Gender: female  PCP: System, Provider Not In  Patient coming from: Home  At baseline, ambulates: Psychiatric nurse Complaint  Patient presents with  . Pneumonia  . Weakness      HPI:   This is an 86 year old female who is unvaccinated against COVID-19 with a past medical history of hypertension, hyperlipidemia, hypothyroidism, type 2 diabetes, V. Tach (initially treated with procainamide and now on amiodarone), stress incontinence, CKD 4 who presented with failure to thrive for several days.  She was diagnosed with COVID-19 on 12/14 and given monoclonal antibodies however eventually was admitted for failure to thrive at home from 12/19 and 12/20 and was recommended SNF at discharge.  However, the patient's husband was at home under hospice care and she wanted to be with him when he died.  The patient's husband has since passed away.  Apparently the family has been trying to get her to come back to the hospital but she has been refusing.  She has been confused, coughing and with poor p.o. intake.  EMS found the patient to be hypoxic and required NRB.  ED provider mention patient is under hospice care.  Unable to get in contact with family to confirm this.  Currently the patient states that she "hurts all over."She is not providing any specific complaints.  ED Course: Afebrile, hemodynamically stable, placed on 6 L/min. Notable Labs: Sodium 135, potassium 4.3, CO2 14, glucose 273, BUN 44, creatinine 2.67, alkaline phosphatase 181, albumin 2.5, BNP 300, troponin 27->30, WBC 17.0, Hb 9.0, platelets 112, triglycerides 217, ferritin 380, CRP 17.7, procalcitonin 0.14, D-dimer 3.31, fibrinogen 631. Notable  Imaging: CXR-worsening bilateral opacities. Patient received azithromycin and ceftriaxone.    Vitals:   05/19/2020 1430 05/07/2020 1445  BP: (!) 156/74 (!) 162/74  Pulse: 79 81  Resp: 20 (!) 21  Temp:    SpO2: 94% 96%     Review of Systems:  Review of Systems  All other systems reviewed and are negative.   Medical/Social/Family History   Past Medical History: Past Medical History:  Diagnosis Date  . Abnormal PFTs (pulmonary function tests) 2008   No obstruction noted. Has been followed clinically.  . Anemia    treated with Procrit  . Fall from slip, trip, or stumble 05/07/2016   [place] resulting in right tibial plateau fracture  . Gout   . High risk medication use    on amiodarone.  Marland Kitchen HTN (hypertension)    Last echo in 2004; EF normal  . Hyperlipidemia   . Hypothyroidism    on replacement  . Irregular heart beat "since 1985"  . Obesity   . Type II diabetes mellitus ([place])   . V-tach [facility])    remote VT, treated initially with procainamide, on amiodarone since 2008    Past Surgical History:  Procedure Laterality Date  . CARDIOVASCULAR STRESS TEST  07/07/2003   EF 70%. NO EVIDENCE OF ISCHEMIA  . CARDIOVERSION  1978  . CATARACT EXTRACTION, BILATERAL Bilateral   .  CATARACT EXTRACTION, BILATERAL Bilateral   . CESAREAN SECTION  1957; 1961; 1967  . FRACTURE SURGERY    . ORIF TIBIA PLATEAU Right 05/10/2016   Procedure: OPEN REDUCTION INTERNAL FIXATION (ORIF) RIGHT TIBIAL PLATEAU  WITH BONE GRAFT;  Surgeon: Leandrew Koyanagi, MD;  Location: [facility];  Service: Orthopedics;  Laterality: Right;  . US ECHOCARDIOGRAPHY  12/29/2002   EF 60-65%  . VAGINAL HYSTERECTOMY      Medications: Prior to Admission medications   Medication Sig Start Date End Date Taking? Authorizing Provider  amiodarone (PACERONE) 100 MG tablet Take 1 tablet by mouth every morning. Patient taking differently: Take 100 mg by mouth daily. 05/04/20   Allred, Jeneen Rinks, MD  amLODipine (NORVASC) 5 MG tablet Take  1 tablet by mouth daily. Patient taking differently: Take 5 mg by mouth daily. 03/31/20   Burtis Junes, NP  ASPIRIN LOW DOSE 81 MG EC tablet Take 1 tablet by mouth daily. Patient taking differently: Take 81 mg by mouth daily. 03/31/20   Burtis Junes, NP  dexamethasone (DECADRON) 6 MG tablet Take 6 mg by mouth at bedtime. For 10 days 05/05/20   [provider]  ELIQUIS 2.5 MG TABS tablet Take 2.5 mg by mouth 2 (two) times daily. 05/06/20   [provider]  Epoetin Alfa (PROCRIT IJ) Inject 10,000 Units as directed every 28 (twenty-eight) days.     [provider]  furosemide (LASIX) 20 MG tablet Take 20 mg by mouth daily.    [provider]  glipiZIDE (GLUCOTROL XL) 5 MG 24 hr tablet Take 1 tablet by mouth daily. Patient taking differently: Take 5 mg by mouth daily with breakfast. 10/03/19   Burtis Junes, NP  hydrALAZINE (APRESOLINE) 50 MG tablet Take 1 tablet by mouth twice daily. Patient taking differently: Take 50 mg by mouth in the morning and at bedtime. 10/03/19   Burtis Junes, NP  levothyroxine (SYNTHROID) 100 MCG tablet Take 1 tablet by mouth daily before breakfast. Patient taking differently: Take 100 mcg by mouth daily before breakfast. 12/02/19   Burtis Junes, NP  Melatonin 5 MG CAPS Take 5 mg by mouth at bedtime.    [provider]  Multiple Vitamin (MULTIVITAMIN WITH MINERALS) TABS tablet Take 1 tablet by mouth daily.    [provider]  omega-3 acid ethyl esters (LOVAZA) 1 g capsule Take 2 capsules by mouth twice daily. Patient taking differently: Take 2 g by mouth 2 (two) times daily. 09/04/19   Burtis Junes, NP  potassium chloride (KLOR-CON) 10 MEQ tablet Take 1 tablet by mouth every morning. Patient taking differently: Take 10 mEq by mouth daily. 10/03/19   Burtis Junes, NP  pravastatin (PRAVACHOL) 20 MG tablet Take 1 tablet by mouth at bedtime. Patient taking differently: Take 20 mg by mouth daily.  10/03/19   Burtis Junes, NP  pseudoephedrine-guaifenesin (MUCINEX D) 60-600 MG 12 hr tablet Take 1 tablet by mouth every 12 (twelve) hours.    [provider]    Allergies:  No Known Allergies  Social History:  reports that she has never smoked. She has never used smokeless tobacco. She reports that she does not drink alcohol and does not use drugs.  Family History: Family History  Problem Relation Age of Onset  . Heart disease Mother   . Heart disease Father      Objective   Physical Exam: Blood pressure (!) 162/74, pulse 81, temperature 98.4 F (36.9 C), temperature source Oral,  resp. rate (!) 21, SpO2 96 %.  Physical Exam Vitals and nursing note reviewed.  Constitutional:      Appearance: She is ill-appearing.  HENT:     Head: Normocephalic.     Mouth/Throat:     Mouth: Mucous membranes are dry.  Eyes:     Pupils: Pupils are equal, round, and reactive to light.  Cardiovascular:     Rate and Rhythm: Normal rate and regular rhythm.  Pulmonary:     Effort: Pulmonary effort is normal. No respiratory distress.  Abdominal:     General: Abdomen is flat. There is no distension.  Musculoskeletal:        General: No swelling or tenderness.  Skin:    General: Skin is warm.     Coloration: Skin is not jaundiced.  Neurological:     Mental Status: She is alert. Mental status is at baseline.      LABS on Admission: I have personally reviewed all the labs and imaging below    Basic Metabolic Panel: Recent Labs  Lab 05/17/2020 1317  NA 135  K 4.3  CL 111  CO2 14*  GLUCOSE 273*  BUN 44*  CREATININE 2.67*  CALCIUM 8.0*   Liver Function Tests: Recent Labs  Lab 04/30/2020 1317  AST 35  ALT 29  ALKPHOS 181*  BILITOT 0.9  PROT 6.1*  ALBUMIN 2.5*   No results for input(s): LIPASE, AMYLASE in the last 168 hours. No results for input(s): AMMONIA in the last 168 hours. CBC: Recent Labs  Lab 05/21/2020 1317  WBC 17.0*  NEUTROABS 15.7*  HGB 9.0*  HCT  27.4*  MCV 84.3  PLT 112*   Cardiac Enzymes: No results for input(s): CKTOTAL, CKMB, CKMBINDEX, TROPONINI in the last 168 hours. BNP: Invalid input(s): POCBNP CBG: No results for input(s): GLUCAP in the last 168 hours.  Radiological Exams on Admission:  DG Chest Port 1 View  Result Date: 05/09/2020 CLINICAL DATA:  COVID positive, dyspnea EXAM: PORTABLE CHEST 1 VIEW COMPARISON:  05/09/2020 chest radiograph. FINDINGS: Right rotated chest radiograph. Stable cardiomediastinal silhouette with top-normal heart size. No pneumothorax. No definite pleural effusion. Patchy opacities in the mid to lower lungs bilaterally, worsened. IMPRESSION: Worsening patchy opacities in the mid to lower lungs bilaterally, compatible with COVID-19 pneumonia. Electronically Signed   By: Ilona Sorrel M.D.   On: 05/19/2020 14:19      EKG: PAC's noted   A & P   Active Problems:   History of ventricular tachycardia   HTN (hypertension)   Hyperlipidemia   Dehydration   CKD (chronic kidney disease) stage 4, GFR 15-29 ml/min (HCC)   COVID-19   Failure to thrive in adult   CAP (community acquired pneumonia)   Debility   1. Acute hypoxic respiratory failure secondary to COVID-19 and concern for CAP a. 89% on room air presentation, currently requiring 6 L/min O2 b. CRP increased from prior 8.5-> 17.7 c. procalcitonin minimally elevated 0.14 d. D-dimer 3.31 (has been elevated since her last hospitalization and she is already on anticoagulation) e. Start remdesivir f. Start Solu-Medrol g. Continue ceftriaxone and azithromycin h. Incentive spirometry and flutter valve  2. Failure to thrive secondary to #1 a. IV fluids b. Encourage p.o. intake as able with SLP eval c. PT eval when able d. Patient may be under hospice care?  Unable to get in contact with patient's family.  Palliative care consulted  3. CKD 4 a. Creatinine has been slowly trending upward over the past year and  is likely that her new  baseline of 2.5-2.6  4. Elevated troponin a. Likely demand ischemia  5. Hypertension a. Continue amiodarone and amlodipine  6. Diabetes a. Sliding scale  7. History of VT a. Continue amiodarone     DVT prophylaxis: Eliquis   Code Status: DNR  Diet: Heart healthy, carb modified Family Communication: Admission, patients condition and plan of care including tests being ordered have been discussed with the patient who indicates understanding and agrees with the plan and Code Status. Patient's family was contacted but did not answer   Disposition Plan: The appropriate patient status for this patient is INPATIENT. Inpatient status is judged to be reasonable and necessary in order to provide the required intensity of service to ensure the patient's safety. The patient's presenting symptoms, physical exam findings, and initial radiographic and laboratory data in the context of their chronic comorbidities is felt to place them at high risk for further clinical deterioration. Furthermore, it is not anticipated that the patient will be medically stable for discharge from the hospital within 2 midnights of admission. The following factors support the patient status of inpatient.   " The patient's presenting symptoms include failure to thrive. " The worrisome physical exam findings include dry mucous membranes, appears ill. " The initial radiographic and laboratory data are worrisome because of worsening opacities of the chest x-ray. " The chronic co-morbidities include as above.   * I certify that at the point of admission it is my clinical judgment that the patient will require inpatient hospital care spanning beyond 2 midnights from the point of admission due to high intensity of service, high risk for further deterioration and high frequency of surveillance required.*   The medical decision making on this patient was of high complexity and the patient is at high risk for clinical  deterioration, therefore this is a level 3  admission.  Consultants  . Palliative  Procedures  . None  Time Spent on Admission: 70 minutes    Harold Hedge, DO [facility]  04/29/2020, 5:52 PM

## 2020-05-20 NOTE — ED Notes (Signed)
Pts medications not given due to needing to be reconciled and verified.

## 2020-05-20 NOTE — ED Notes (Signed)
Juliette Alcide, would like an update, [phone].

## 2020-05-20 NOTE — ED Notes (Signed)
Patient repositioned for comfort. Patient placed back on oxygen despite her screaming and fighting she "does not want it" RN attempted to provide supportive patient care.

## 2020-05-20 NOTE — ED Notes (Addendum)
Pt refusing to wear oxygen despite needing it. Pt states that it hurts her nose and refuses to let this RN try to put it in her nose. This RN attempted to explain why she need oxygen, but pt continued to scream and refuse the oxygen.

## 2020-05-20 NOTE — ED Provider Notes (Signed)
[place] EMERGENCY DEPARTMENT Provider Note  CSN: [id number] Arrival date & time: 05/15/2020 1240    History Chief Complaint  Patient presents with  . Pneumonia  . Weakness    HPI  Leslie Coleman is a 86 y.o. female with history of HTN was diagnosed with Covid on 12/14, given MAB. Eventually admitted for failure to thrive at home, PT recommended SNF but patient's husband was at home on hospice and she wanted to be with him when he died. He has since passed away. Apparently the family has been trying to get her to come back to the hospital but she has been refusing. She has been confused, not drinking or eating well, coughing. EMS found to be hypoxic, improved with NRB. Patient is also on Hospice for CKD and is DNR ([place] form at bedside). Patient states she feels fine and denies complaints, but history is limited as she will not provide many details. She has had Abx for both UTI and CAP this month. Also had a course of steroids.    Past Medical History:  Diagnosis Date  . Abnormal PFTs (pulmonary function tests) 2008   No obstruction noted. Has been followed clinically.  . Anemia    treated with Procrit  . Fall from slip, trip, or stumble 05/07/2016   [place] resulting in right tibial plateau fracture  . Gout   . High risk medication use    on amiodarone.  Marland Kitchen HTN (hypertension)    Last echo in 2004; EF normal  . Hyperlipidemia   . Hypothyroidism    on replacement  . Irregular heart beat "since 1985"  . Obesity   . Type II diabetes mellitus ([place])   . V-tach [facility])    remote VT, treated initially with procainamide, on amiodarone since 2008    Past Surgical History:  Procedure Laterality Date  . CARDIOVASCULAR STRESS TEST  07/07/2003   EF 70%. NO EVIDENCE OF ISCHEMIA  . CARDIOVERSION  1978  . CATARACT EXTRACTION, BILATERAL Bilateral   . CATARACT EXTRACTION, BILATERAL Bilateral   . CESAREAN SECTION  1957; 1961; 1967  . FRACTURE SURGERY    . ORIF TIBIA  PLATEAU Right 05/10/2016   Procedure: OPEN REDUCTION INTERNAL FIXATION (ORIF) RIGHT TIBIAL PLATEAU  WITH BONE GRAFT;  Surgeon: Leandrew Koyanagi, MD;  Location: [place];  Service: Orthopedics;  Laterality: Right;  . US ECHOCARDIOGRAPHY  12/29/2002   EF 60-65%  . VAGINAL HYSTERECTOMY      Family History  Problem Relation Age of Onset  . Heart disease Mother   . Heart disease Father     Social History   Tobacco Use  . Smoking status: Never Smoker  . Smokeless tobacco: Never Used  Vaping Use  . Vaping Use: Never used  Substance Use Topics  . Alcohol use: No  . Drug use: No     Home Medications Prior to Admission medications   Medication Sig Start Date End Date Taking? Authorizing Provider  amiodarone (PACERONE) 100 MG tablet Take 1 tablet by mouth every morning. Patient taking differently: Take 100 mg by mouth daily. 05/04/20   Allred, Jeneen Rinks, MD  amLODipine (NORVASC) 5 MG tablet Take 1 tablet by mouth daily. Patient taking differently: Take 5 mg by mouth daily. 03/31/20   Burtis Junes, NP  ASPIRIN LOW DOSE 81 MG EC tablet Take 1 tablet by mouth daily. Patient taking differently: Take 81 mg by mouth daily. 03/31/20   Burtis Junes, NP  dexamethasone (DECADRON)  6 MG tablet Take 6 mg by mouth at bedtime. For 10 days 05/05/20   [provider]  ELIQUIS 2.5 MG TABS tablet Take 2.5 mg by mouth 2 (two) times daily. 05/06/20   [provider]  Epoetin Alfa (PROCRIT IJ) Inject 10,000 Units as directed every 28 (twenty-eight) days.     [provider]  furosemide (LASIX) 20 MG tablet Take 20 mg by mouth daily.    [provider]  glipiZIDE (GLUCOTROL XL) 5 MG 24 hr tablet Take 1 tablet by mouth daily. Patient taking differently: Take 5 mg by mouth daily with breakfast. 10/03/19   Burtis Junes, NP  hydrALAZINE (APRESOLINE) 50 MG tablet Take 1 tablet by mouth twice daily. Patient taking differently: Take 50 mg by mouth in the morning and at bedtime.  10/03/19   Burtis Junes, NP  levothyroxine (SYNTHROID) 100 MCG tablet Take 1 tablet by mouth daily before breakfast. Patient taking differently: Take 100 mcg by mouth daily before breakfast. 12/02/19   Burtis Junes, NP  Melatonin 5 MG CAPS Take 5 mg by mouth at bedtime.    [provider]  Multiple Vitamin (MULTIVITAMIN WITH MINERALS) TABS tablet Take 1 tablet by mouth daily.    [provider]  omega-3 acid ethyl esters (LOVAZA) 1 g capsule Take 2 capsules by mouth twice daily. Patient taking differently: Take 2 g by mouth 2 (two) times daily. 09/04/19   Burtis Junes, NP  potassium chloride (KLOR-CON) 10 MEQ tablet Take 1 tablet by mouth every morning. Patient taking differently: Take 10 mEq by mouth daily. 10/03/19   Burtis Junes, NP  pravastatin (PRAVACHOL) 20 MG tablet Take 1 tablet by mouth at bedtime. Patient taking differently: Take 20 mg by mouth daily. 10/03/19   Burtis Junes, NP  pseudoephedrine-guaifenesin (MUCINEX D) 60-600 MG 12 hr tablet Take 1 tablet by mouth every 12 (twelve) hours.    [provider]     Allergies    Patient has no known allergies.   Review of Systems   Review of Systems A comprehensive review of systems was completed and negative except as noted in HPI.    Physical Exam BP (!) 162/74   Pulse 81   Temp 98.4 F (36.9 C) (Oral)   Resp (!) 21   SpO2 96%   Physical Exam Vitals and nursing note reviewed.  Constitutional:      Appearance: Normal appearance.  HENT:     Head: Normocephalic and atraumatic.     Nose: Nose normal.     Mouth/Throat:     Mouth: Mucous membranes are dry.  Eyes:     Extraocular Movements: Extraocular movements intact.     Conjunctiva/sclera: Conjunctivae normal.  Cardiovascular:     Rate and Rhythm: Normal rate.  Pulmonary:     Effort: Pulmonary effort is normal.     Breath sounds: Rhonchi present.  Abdominal:     General: Abdomen is flat.     Palpations: Abdomen is soft.      Tenderness: There is no abdominal tenderness.  Musculoskeletal:        General: No swelling. Normal range of motion.     Cervical back: Neck supple.  Skin:    General: Skin is warm and dry.  Neurological:     General: No focal deficit present.     Mental Status: She is alert.  Psychiatric:        Mood and Affect: Mood normal.  ED Results / Procedures / Treatments   Labs (all labs ordered are listed, but only abnormal results are displayed) Labs Reviewed  CBC WITH DIFFERENTIAL/PLATELET - Abnormal; Notable for the following components:      Result Value   WBC 17.0 (*)    RBC 3.25 (*)    Hemoglobin 9.0 (*)    HCT 27.4 (*)    RDW 17.3 (*)    Platelets 112 (*)    Neutro Abs 15.7 (*)    Abs Immature Granulocytes 0.09 (*)    All other components within normal limits  BRAIN NATRIURETIC PEPTIDE - Abnormal; Notable for the following components:   B Natriuretic Peptide 300.7 (*)    All other components within normal limits  COMPREHENSIVE METABOLIC PANEL - Abnormal; Notable for the following components:   CO2 14 (*)    Glucose, Bld 273 (*)    BUN 44 (*)    Creatinine, Ser 2.67 (*)    Calcium 8.0 (*)    Total Protein 6.1 (*)    Albumin 2.5 (*)    Alkaline Phosphatase 181 (*)    GFR, Estimated 17 (*)    All other components within normal limits  D-DIMER, QUANTITATIVE (NOT AT [facility]) - Abnormal; Notable for the following components:   D-Dimer, Quant 3.31 (*)    All other components within normal limits  FERRITIN - Abnormal; Notable for the following components:   Ferritin 380 (*)    All other components within normal limits  TRIGLYCERIDES - Abnormal; Notable for the following components:   Triglycerides 217 (*)    All other components within normal limits  FIBRINOGEN - Abnormal; Notable for the following components:   Fibrinogen 631 (*)    All other components within normal limits  C-REACTIVE PROTEIN - Abnormal; Notable for the following components:   CRP 17.7 (*)     All other components within normal limits  LACTATE DEHYDROGENASE - Abnormal; Notable for the following components:   LDH 269 (*)    All other components within normal limits  TROPONIN I (HIGH SENSITIVITY) - Abnormal; Notable for the following components:   Troponin I (High Sensitivity) 27 (*)    All other components within normal limits  CULTURE, BLOOD (ROUTINE X 2)  CULTURE, BLOOD (ROUTINE X 2)  LACTIC ACID, PLASMA  PROCALCITONIN  TROPONIN I (HIGH SENSITIVITY)    EKG EKG Interpretation  Date/Time:  Thursday May 20 2020 14:35:05 EST Ventricular Rate:  84 PR Interval:    QRS Duration: 93 QT Interval:  396 QTC Calculation: 469 R Axis:   28 Text Interpretation: Sinus rhythm Atrial premature complexes No significant change since last tracing Confirmed by Calvert Cantor [phone]) on 05/06/2020 2:35:54 PM   Radiology DG Chest Port 1 View  Result Date: 05/17/2020 CLINICAL DATA:  COVID positive, dyspnea EXAM: PORTABLE CHEST 1 VIEW COMPARISON:  05/09/2020 chest radiograph. FINDINGS: Right rotated chest radiograph. Stable cardiomediastinal silhouette with top-normal heart size. No pneumothorax. No definite pleural effusion. Patchy opacities in the mid to lower lungs bilaterally, worsened. IMPRESSION: Worsening patchy opacities in the mid to lower lungs bilaterally, compatible with COVID-19 pneumonia. Electronically Signed   By: Ilona Sorrel M.D.   On: 05/03/2020 14:19    Procedures Procedures  Medications Ordered in the ED Medications  cefTRIAXone (ROCEPHIN) 1 g in sodium chloride 0.9 % 100 mL IVPB (has no administration in time range)  azithromycin (ZITHROMAX) 500 mg in sodium chloride 0.9 % 250 mL IVPB (has no administration in time range)  MDM Rules/Calculators/A&P MDM Patient noted to be hypoxic on my arrival to room with NRB off her face, SpO2 87%. She refuses to wear mask, will try nasal canula. Labs and CXR ordered.  ED Course  I have reviewed the triage vital  signs and the nursing notes.  Pertinent labs & imaging results that were available during my care of the patient were reviewed by me and considered in my medical decision making (see chart for details).  Clinical Course as of 05/07/2020 1521  Thu May 20, 2020  1429 CXR shows worsening from recent admission.  [CS]  1447 Lactic acid is neg.  [CS]  1510 Labs reviewed, CMP with CKD at baseline. CBC with leukocytosis. Dimer is elevated of unclear significance. Patient is sleeping comfortably now, wearing oxygen with good SpO2. Will treat for CAP given worsening >2 weeks post Covid diagnosis and leukocytosis. Spoke with Dr. Neysa Bonito, Hospitalist who will evaluate in the ED.  [CS]    Clinical Course User Index [CS] Truddie Hidden, MD    Final Clinical Impression(s) / ED Diagnoses Final diagnoses:  Community acquired pneumonia, unspecified laterality    Rx / DC Orders ED Discharge Orders    None       Truddie Hidden, MD 04/29/2020 [phone]

## 2020-05-20 NOTE — Progress Notes (Signed)
Pt unable to do flutter

## 2020-05-20 NOTE — ED Triage Notes (Signed)
Pt BIB EMS from home. Pts family called EMS due to difficulty breathing. Pt was diagnosed with COVID 14 days ago. Pt just finished antibiotics for a UTI and recently started antibiotics for pneumonia. Pt has had loss of appetite. Family is concerned that pt is not "acting appropriately". Pts husband just passed away and family is worried she is going to dies of "broken heart syndrome". Family wrote up a DNR before pr was transported to hospital.  89% RA 15L NB 97%  CBG 314 Temp 99.9 BP 170/70 20G LAC 125 mg Solumedrol

## 2020-05-21 DIAGNOSIS — R5381 Other malaise: Secondary | ICD-10-CM | POA: Diagnosis not present

## 2020-05-21 DIAGNOSIS — Z7189 Other specified counseling: Secondary | ICD-10-CM | POA: Diagnosis not present

## 2020-05-21 DIAGNOSIS — J189 Pneumonia, unspecified organism: Secondary | ICD-10-CM | POA: Diagnosis not present

## 2020-05-21 DIAGNOSIS — R627 Adult failure to thrive: Secondary | ICD-10-CM | POA: Diagnosis not present

## 2020-05-21 DIAGNOSIS — U071 COVID-19: Secondary | ICD-10-CM | POA: Diagnosis not present

## 2020-05-21 LAB — BLOOD CULTURE ID PANEL (REFLEXED) - BCID2

## 2020-05-21 LAB — D-DIMER, QUANTITATIVE: D-Dimer, Quant: 3.26 ug/mL-FEU — ABNORMAL HIGH (ref 0.00–0.50)

## 2020-05-21 LAB — CBC WITH DIFFERENTIAL/PLATELET
Abs Immature Granulocytes: 0.07 10*3/uL (ref 0.00–0.07)
Basophils Absolute: 0 10*3/uL (ref 0.0–0.1)
Basophils Relative: 0 %
Eosinophils Absolute: 0 10*3/uL (ref 0.0–0.5)
Eosinophils Relative: 0 %
HCT: 26.8 % — ABNORMAL LOW (ref 36.0–46.0)
Hemoglobin: 8.8 g/dL — ABNORMAL LOW (ref 12.0–15.0)
Immature Granulocytes: 1 %
Lymphocytes Relative: 5 %
Lymphs Abs: 0.5 10*3/uL — ABNORMAL LOW (ref 0.7–4.0)
MCH: 27.8 pg (ref 26.0–34.0)
MCHC: 32.8 g/dL (ref 30.0–36.0)
MCV: 84.5 fL (ref 80.0–100.0)
Monocytes Absolute: 0.1 10*3/uL (ref 0.1–1.0)
Monocytes Relative: 1 %
Neutro Abs: 10 10*3/uL — ABNORMAL HIGH (ref 1.7–7.7)
Neutrophils Relative %: 93 %
Platelets: 97 10*3/uL — ABNORMAL LOW (ref 150–400)
RBC: 3.17 MIL/uL — ABNORMAL LOW (ref 3.87–5.11)
RDW: 17.1 % — ABNORMAL HIGH (ref 11.5–15.5)
WBC: 10.7 10*3/uL — ABNORMAL HIGH (ref 4.0–10.5)
nRBC: 0 % (ref 0.0–0.2)

## 2020-05-21 LAB — COMPREHENSIVE METABOLIC PANEL
ALT: 29 U/L (ref 0–44)
AST: 30 U/L (ref 15–41)
Albumin: 2.3 g/dL — ABNORMAL LOW (ref 3.5–5.0)
Alkaline Phosphatase: 164 U/L — ABNORMAL HIGH (ref 38–126)
Anion gap: 12 (ref 5–15)
BUN: 50 mg/dL — ABNORMAL HIGH (ref 8–23)
CO2: 14 mmol/L — ABNORMAL LOW (ref 22–32)
Calcium: 7.9 mg/dL — ABNORMAL LOW (ref 8.9–10.3)
Chloride: 110 mmol/L (ref 98–111)
Creatinine, Ser: 2.59 mg/dL — ABNORMAL HIGH (ref 0.44–1.00)
GFR, Estimated: 17 mL/min — ABNORMAL LOW (ref >60)
Glucose, Bld: 340 mg/dL — ABNORMAL HIGH (ref 70–99)
Potassium: 4.4 mmol/L (ref 3.5–5.1)
Sodium: 136 mmol/L (ref 135–145)
Total Bilirubin: 0.6 mg/dL (ref 0.3–1.2)
Total Protein: 5.8 g/dL — ABNORMAL LOW (ref 6.5–8.1)

## 2020-05-21 LAB — GLUCOSE, CAPILLARY
Glucose-Capillary: 195 mg/dL — ABNORMAL HIGH (ref 70–99)
Glucose-Capillary: 214 mg/dL — ABNORMAL HIGH (ref 70–99)
Glucose-Capillary: 231 mg/dL — ABNORMAL HIGH (ref 70–99)
Glucose-Capillary: 270 mg/dL — ABNORMAL HIGH (ref 70–99)
Glucose-Capillary: 295 mg/dL — ABNORMAL HIGH (ref 70–99)

## 2020-05-21 LAB — HEMOGLOBIN A1C
Hgb A1c MFr Bld: 8 % — ABNORMAL HIGH (ref 4.8–5.6)
Mean Plasma Glucose: 182.9 mg/dL

## 2020-05-21 LAB — FERRITIN: Ferritin: 434 ng/mL — ABNORMAL HIGH (ref 11–307)

## 2020-05-21 LAB — C-REACTIVE PROTEIN: CRP: 22.9 mg/dL — ABNORMAL HIGH (ref <1.0)

## 2020-05-21 MED ORDER — BARICITINIB 1 MG PO TABS
1.0000 mg | ORAL_TABLET | Freq: Every day | ORAL | Status: DC
  Filled 2020-05-21 (×2): qty 1

## 2020-05-21 NOTE — Progress Notes (Addendum)
Chaplain responding to spiritual care consult.   Consulted with pt RN.  Pt's spouse died recently and funeral is today.   Provided grief support at bedside.  Leslie Coleman is intermittently confused, but oriented to place and person.  Chaplain provided space for eulogizing.  Leslie Coleman shares her love for her spouse, engaging in conversation with chaplain about their history, family, and joys.  She notes he was raised in [place] and was one of 9 children.  He worked in Pension scheme manager and then for [employer], and was a Emergency planning/management officer.   Chaplain provided prayers of comfort and gratefulness at bedside.

## 2020-05-21 NOTE — Progress Notes (Signed)
PHARMACY - PHYSICIAN COMMUNICATION CRITICAL VALUE ALERT - BLOOD CULTURE IDENTIFICATION (BCID)  Leslie Coleman is an 86 y.o. female who presented to [facility] on 05/19/2020 with a chief complaint of  Chief Complaint  Patient presents with  . Pneumonia  . Weakness     Assessment:  COVID PNA   Name of physician (or Provider) Contacted: Dr. Darrick Meigs   Current antibiotics: None  Changes to prescribed antibiotics recommended:  - Likely contaminant, no need for antibiotics as 1/4 bottles   Results for orders placed or performed during the hospital encounter of 05/11/2020  Blood Culture ID Panel (Reflexed) (Collected: 05/15/2020  1:47 PM)  Result Value Ref Range   Enterococcus faecalis NOT DETECTED NOT DETECTED   Enterococcus Faecium NOT DETECTED NOT DETECTED   Listeria monocytogenes NOT DETECTED NOT DETECTED   Staphylococcus species DETECTED (A) NOT DETECTED   Staphylococcus aureus (BCID) NOT DETECTED NOT DETECTED   Staphylococcus epidermidis DETECTED (A) NOT DETECTED   Staphylococcus lugdunensis NOT DETECTED NOT DETECTED   Streptococcus species NOT DETECTED NOT DETECTED   Streptococcus agalactiae NOT DETECTED NOT DETECTED   Streptococcus pneumoniae NOT DETECTED NOT DETECTED   Streptococcus pyogenes NOT DETECTED NOT DETECTED   A.calcoaceticus-baumannii NOT DETECTED NOT DETECTED   Bacteroides fragilis NOT DETECTED NOT DETECTED   Enterobacterales NOT DETECTED NOT DETECTED   Enterobacter cloacae complex NOT DETECTED NOT DETECTED   Escherichia coli NOT DETECTED NOT DETECTED   Klebsiella aerogenes NOT DETECTED NOT DETECTED   Klebsiella oxytoca NOT DETECTED NOT DETECTED   Klebsiella pneumoniae NOT DETECTED NOT DETECTED   Proteus species NOT DETECTED NOT DETECTED   Salmonella species NOT DETECTED NOT DETECTED   Serratia marcescens NOT DETECTED NOT DETECTED   Haemophilus influenzae NOT DETECTED NOT DETECTED   Neisseria meningitidis NOT DETECTED NOT DETECTED   Pseudomonas aeruginosa NOT  DETECTED NOT DETECTED   Stenotrophomonas maltophilia NOT DETECTED NOT DETECTED   Candida albicans NOT DETECTED NOT DETECTED   Candida auris NOT DETECTED NOT DETECTED   Candida glabrata NOT DETECTED NOT DETECTED   Candida krusei NOT DETECTED NOT DETECTED   Candida parapsilosis NOT DETECTED NOT DETECTED   Candida tropicalis NOT DETECTED NOT DETECTED   Cryptococcus neoformans/gattii NOT DETECTED NOT DETECTED   Methicillin resistance mecA/C DETECTED (A) NOT DETECTED    Royetta Asal, PharmD, BCPS 05/21/2020 3:31 PM

## 2020-05-21 NOTE — Progress Notes (Signed)
[facility]  PROGRESS NOTE  Leslie Coleman TSV:[id number] DOB: 1928-08-04 DOA: 04/25/2020 PCP: System, Provider Not In   Brief HPI:   86 year old female who is unvaccinated against COVID-19, past medical history of hypertension, hyperlipidemia, hypothyroidism, diabetes mellitus type 2, V. tach initially treated with procainamide and now on amiodarone, stress incontinence, CKD stage IV presented with failure to thrive for several days.  She was diagnosed with COVID-19 infection on 05/04/2020 and was given multiple antibodies however she was admitted on 05/09/2020 and patient was discharged home her husband was dying at home under hospice care.  Patient has been confused, coughing with poor p.o. intake. Patient was admitted with failure to thrive, acute hypoxemic respiratory failure due to COVID-19 pneumonia.   Subjective   Patient seen and examined, she is more alert and communicative today.  Denies any complaints.  She is currently not requiring oxygen O2 sats are 90% on room air.  Patient is refusing to keep oxygen on.   Assessment/Plan:     1. Acute hypoxemic respiratory failure-secondary to COVID-19 pneumonia also concern for community-acquired pneumonia.  Patient is currently on ceftriaxone, Zithromax.  She is also been started on remdesivir per pharmacy consultation.  Continue Solu-Medrol 0.5 mg/kg every 12 hours.  Incentive spirometry, flutter valve.  Patient CRP is going up to 22.9.  We will start her on baricitinib.  Called and discussed with patient's daughter who is okay with starting baricitinib.  Follow CRP, ferritin, D-dimer in a.m. 2. Failure to thrive-secondary to 1, speech therapy evaluation done today.  Patient started on dysphagia 3 thin liquid diet.  Encourage p.o. intake. 3. CKD stage IV-creatinine has been slowly trending upwards over the past 1 year, likely at baseline. 4. Elevated troponin-mild elevation of troponin, likely from demand ischemia. 5. Diabetes  mellitus type 2-continue sliding scale insulin NovoLog. 6. History of VT-continue amiodarone 7. Hypertension-continue amlodipine     COVID-19 Labs  Recent Labs    05/03/2020 1347 05/18/2020 1454 05/21/20 0405  DDIMER 3.31*  --  3.26*  FERRITIN 380*  --  434*  LDH  --  269*  --   CRP 17.7*  --  22.9*    Lab Results  Component Value Date   SARSCOV2NAA POSITIVE (A) 05/04/2020   [place] NEGATIVE 04/29/2020     Scheduled medications:   . amiodarone  100 mg Oral Daily  . amLODipine  5 mg Oral Daily  . apixaban  2.5 mg Oral BID  . aspirin EC  81 mg Oral Daily  . furosemide  20 mg Oral Daily  . hydrALAZINE  50 mg Oral BID  . insulin aspart  0-15 Units Subcutaneous TID WC  . Ipratropium-Albuterol  1 puff Inhalation Q6H  . levothyroxine  100 mcg Oral Q0600  . melatonin  5 mg Oral QHS  . methylPREDNISolone (SOLU-MEDROL) injection  0.5 mg/kg Intravenous Q12H   Followed by  . [START ON 05/23/2020] predniSONE  50 mg Oral Daily  . pravastatin  20 mg Oral Daily  . sodium chloride flush  3 mL Intravenous Q12H         CBG: Recent Labs  Lab 05/07/2020 1942 05/08/2020 2142 05/21/20 0051 05/21/20 0724 05/21/20 1158  GLUCAP 355* 328* 270* 295* 231*    SpO2: 90 % O2 Flow Rate (L/min): 6 L/min (refusing to leave on)    CBC: Recent Labs  Lab 04/21/2020 1317 05/21/20 0405  WBC 17.0* 10.7*  NEUTROABS 15.7* 10.0*  HGB 9.0* 8.8*  HCT 27.4* 26.8*  MCV 84.3  84.5  PLT 112* 97*    Basic Metabolic Panel: Recent Labs  Lab 05/08/2020 1317 05/21/20 0405  NA 135 136  K 4.3 4.4  CL 111 110  CO2 14* 14*  GLUCOSE 273* 340*  BUN 44* 50*  CREATININE 2.67* 2.59*  CALCIUM 8.0* 7.9*     Liver Function Tests: Recent Labs  Lab 05/07/2020 1317 05/21/20 0405  AST 35 30  ALT 29 29  ALKPHOS 181* 164*  BILITOT 0.9 0.6  PROT 6.1* 5.8*  ALBUMIN 2.5* 2.3*     Antibiotics: Anti-infectives (From admission, onward)   Start     Dose/Rate Route Frequency Ordered Stop   05/21/20  1600  cefTRIAXone (ROCEPHIN) 2 g in sodium chloride 0.9 % 100 mL IVPB        2 g 200 mL/hr over 30 Minutes Intravenous Every 24 hours 05/19/2020 1744 05/30/20 1559   05/21/20 1600  azithromycin (ZITHROMAX) 500 mg in sodium chloride 0.9 % 250 mL IVPB        500 mg 250 mL/hr over 60 Minutes Intravenous Every 24 hours 04/21/2020 1744 2020/05/30 1559   05/21/20 1000  remdesivir 100 mg in sodium chloride 0.9 % 100 mL IVPB       "Followed by" Linked Group Details   100 mg 200 mL/hr over 30 Minutes Intravenous Daily 04/24/2020 1743 05/25/20 0959   05/10/2020 1930  remdesivir 200 mg in sodium chloride 0.9% 250 mL IVPB       "Followed by" Linked Group Details   200 mg 580 mL/hr over 30 Minutes Intravenous Once 05/14/2020 1743 05/09/2020 2046   04/24/2020 1515  cefTRIAXone (ROCEPHIN) 1 g in sodium chloride 0.9 % 100 mL IVPB        1 g 200 mL/hr over 30 Minutes Intravenous  Once 05/19/2020 1508 04/30/2020 1626   05/12/2020 1515  azithromycin (ZITHROMAX) 500 mg in sodium chloride 0.9 % 250 mL IVPB        500 mg 250 mL/hr over 60 Minutes Intravenous  Once 05/14/2020 1508 05/17/2020 1707       DVT prophylaxis: Apixaban  Code Status: Full code  Family Communication: No family at bedside   Consultants:    Procedures:      Objective   Vitals:   05/21/20 0415 05/21/20 0811 05/21/20 1208 05/21/20 1340  BP: (!) 142/66 (!) 149/64 (!) 157/67 95/74  Pulse: 70 72 72 73  Resp: 20     Temp: 97.8 F (36.6 C) 98.1 F (36.7 C) 98.6 F (37 C) 98.1 F (36.7 C)  TempSrc: Oral Oral Oral Oral  SpO2: 90% 94% 90% 90%    Intake/Output Summary (Last 24 hours) at 05/21/2020 1536 Last data filed at 05/21/2020 1409 Gross per 24 hour  Intake 954 ml  Output 200 ml  Net 754 ml    12/29 1901 - 12/31 0700 In: 600  Out: -   There were no vitals filed for this visit.  Physical Examination:   General-appears in no acute distress Heart-S1-S2, regular, no murmur auscultated Lungs-scattered rhonchi auscultated  bilaterally Abdomen-soft, nontender, no organomegaly Extremities-no edema in the lower extremities Neuro-alert, oriented x3, no focal deficit noted  Status is: Inpatient  Dispo: The patient is from: Home              Anticipated d/c is to: Home              Anticipated d/c date is: 05/25/2019  Patient currently not stable for discharge  Barrier to discharge-treatment for COVID-19 pneumonia  Pressure Injury 10/19/16 Stage I -  Intact skin with non-blanchable redness of a localized area usually over a bony prominence. (Active)  10/19/16 1741  Location: Buttocks  Location Orientation: Medial  Staging: Stage I -  Intact skin with non-blanchable redness of a localized area usually over a bony prominence.  Wound Description (Comments):   Present on Admission: Yes           Data Reviewed:   Recent Results (from the past 240 hour(s))  Culture, blood (routine x 2)     Status: None (Preliminary result)   Collection Time: 05/05/2020  1:47 PM   Specimen: BLOOD LEFT FOREARM  Result Value Ref Range Status   Specimen Description   Final    BLOOD LEFT FOREARM Performed at [facility] [address] [place], [place] [address]    Special Requests   Final    BOTTLES DRAWN AEROBIC AND ANAEROBIC Blood Culture adequate volume Performed at [facility] [address], [place], [place] [address]    Culture  Setup Time   Final    GRAM POSITIVE COCCI IN CLUSTERS ANAEROBIC BOTTLE ONLY Organism ID to follow CRITICAL RESULT CALLED TO, READ BACK BY AND VERIFIED WITH: Guadlupe Spanish PharmD 15:05 05/21/20 ([email or web address])    Culture   Final    NO GROWTH < 24 HOURS Performed at [facility], [address] [place], [place] [address]    Report Status PENDING  Incomplete  Culture, blood (routine x 2)     Status: None (Preliminary result)   Collection Time: 05/06/2020  1:47 PM   Specimen: BLOOD  Result Value Ref Range Status   Specimen Description    Final    BLOOD RIGHT ANTECUBITAL Performed at [facility] [address] [place], [place] [address]    Special Requests   Final    BOTTLES DRAWN AEROBIC AND ANAEROBIC Blood Culture adequate volume Performed at [facility] [address], [place], [place] [address]    Culture   Final    NO GROWTH < 24 HOURS Performed at [facility] [address] [place], [place] [address]    Report Status PENDING  Incomplete  Blood Culture ID Panel (Reflexed)     Status: Abnormal   Collection Time: 05/06/2020  1:47 PM  Result Value Ref Range Status   Enterococcus faecalis NOT DETECTED NOT DETECTED Final   Enterococcus Faecium NOT DETECTED NOT DETECTED Final   Listeria monocytogenes NOT DETECTED NOT DETECTED Final   Staphylococcus species DETECTED (A) NOT DETECTED Final    Comment: CRITICAL RESULT CALLED TO, READ BACK BY AND VERIFIED WITH: Guadlupe Spanish PharmD 15:05 05/21/20 ([email or web address])    Staphylococcus aureus (BCID) NOT DETECTED NOT DETECTED Final   Staphylococcus epidermidis DETECTED (A) NOT DETECTED Final    Comment: Methicillin (oxacillin) resistant coagulase negative staphylococcus. Possible blood culture contaminant (unless isolated from more than one blood culture draw or clinical case suggests pathogenicity). No antibiotic treatment is indicated for blood  culture contaminants. CRITICAL RESULT CALLED TO, READ BACK BY AND VERIFIED WITH: Guadlupe Spanish PharmD 15:05 05/21/20 ([email or web address])    Staphylococcus lugdunensis NOT DETECTED NOT DETECTED Final   Streptococcus species NOT DETECTED NOT DETECTED Final   Streptococcus agalactiae NOT DETECTED NOT DETECTED Final   Streptococcus pneumoniae NOT DETECTED NOT DETECTED Final   Streptococcus pyogenes NOT DETECTED NOT DETECTED Final   A.calcoaceticus-baumannii  NOT DETECTED NOT DETECTED Final   Bacteroides fragilis NOT DETECTED NOT DETECTED Final   Enterobacterales NOT DETECTED NOT DETECTED Final    Enterobacter cloacae complex NOT DETECTED NOT DETECTED Final   Escherichia coli NOT DETECTED NOT DETECTED Final   Klebsiella aerogenes NOT DETECTED NOT DETECTED Final   Klebsiella oxytoca NOT DETECTED NOT DETECTED Final   Klebsiella pneumoniae NOT DETECTED NOT DETECTED Final   Proteus species NOT DETECTED NOT DETECTED Final   Salmonella species NOT DETECTED NOT DETECTED Final   Serratia marcescens NOT DETECTED NOT DETECTED Final   Haemophilus influenzae NOT DETECTED NOT DETECTED Final   Neisseria meningitidis NOT DETECTED NOT DETECTED Final   Pseudomonas aeruginosa NOT DETECTED NOT DETECTED Final   Stenotrophomonas maltophilia NOT DETECTED NOT DETECTED Final   Candida albicans NOT DETECTED NOT DETECTED Final   Candida auris NOT DETECTED NOT DETECTED Final   Candida glabrata NOT DETECTED NOT DETECTED Final   Candida krusei NOT DETECTED NOT DETECTED Final   Candida parapsilosis NOT DETECTED NOT DETECTED Final   Candida tropicalis NOT DETECTED NOT DETECTED Final   Cryptococcus neoformans/gattii NOT DETECTED NOT DETECTED Final   Methicillin resistance mecA/C DETECTED (A) NOT DETECTED Final    Comment: CRITICAL RESULT CALLED TO, READ BACK BY AND VERIFIED WITH: Guadlupe Spanish PharmD 15:05 05/21/20 ([email or web address]) Performed at [facility], [address] [place], [place] [address]     No results for input(s): LIPASE, AMYLASE in the last 168 hours. No results for input(s): AMMONIA in the last 168 hours.  Cardiac Enzymes: No results for input(s): CKTOTAL, CKMB, CKMBINDEX, TROPONINI in the last 168 hours. BNP (last 3 results) Recent Labs    05/11/2020 1317  BNP 300.7*    ProBNP (last 3 results) No results for input(s): PROBNP in the last 8760 hours.  Studies:  DG Chest Port 1 View  Result Date: 04/30/2020 CLINICAL DATA:  COVID positive, dyspnea EXAM: PORTABLE CHEST 1 VIEW COMPARISON:  05/09/2020 chest radiograph. FINDINGS: Right rotated chest radiograph. Stable cardiomediastinal  silhouette with top-normal heart size. No pneumothorax. No definite pleural effusion. Patchy opacities in the mid to lower lungs bilaterally, worsened. IMPRESSION: Worsening patchy opacities in the mid to lower lungs bilaterally, compatible with COVID-19 pneumonia. Electronically Signed   By: Ilona Sorrel M.D.   On: 05/08/2020 14:19       [place]   [facility] If 7PM-7AM, please contact night-coverage at [email or web address], Office  [phone]   05/21/2020, 3:36 PM  LOS: 1 day

## 2020-05-21 NOTE — Plan of Care (Signed)
Pt admitted with Covid pneumonia, care plans initiated

## 2020-05-21 NOTE — Evaluation (Signed)
Clinical/Bedside Swallow Evaluation Patient Details  Name: Leslie Coleman MRN: [id number] Date of Birth: August 07, 1928  Today's Date: 05/21/2020 Time: SLP Start Time (ACUTE ONLY): 1131 SLP Stop Time (ACUTE ONLY): 1210 SLP Time Calculation (min) (ACUTE ONLY): 39 min  Past Medical History:  Past Medical History:  Diagnosis Date  . Abnormal PFTs (pulmonary function tests) 2008   No obstruction noted. Has been followed clinically.  . Anemia    treated with Procrit  . Fall from slip, trip, or stumble 05/07/2016   [place] resulting in right tibial plateau fracture  . Gout   . High risk medication use    on amiodarone.  Mar[place] Kitchen HTN (hypertension)    Last echo in 2004; EF normal  . Hyperlipidemia   . Hypothyroidism    on replacement  . Irregular heart beat "since 1985"  . Obesity   . Type II diabetes mellitus ([place])   . V-tach [facility])    remote VT, treated initially with procainamide, on amiodarone since 2008   Past Surgical History:  Past Surgical History:  Procedure Laterality Date  . CARDIOVASCULAR STRESS TEST  07/07/2003   EF 70%. NO EVIDENCE OF ISCHEMIA  . CARDIOVERSION  1978  . CATARACT EXTRACTION, BILATERAL Bilateral   . CATARACT EXTRACTION, BILATERAL Bilateral   . CESAREAN SECTION  1957; 1961; 1967  . FRACTURE SURGERY    . ORIF TIBIA PLATEAU Right 05/10/2016   Procedure: OPEN REDUCTION INTERNAL FIXATION (ORIF) RIGHT TIBIAL PLATEAU  WITH BONE GRAFT;  Surgeon: Leandrew Koyanagi, MD;  Location: [facility];  Service: Orthopedics;  Laterality: Right;  . US ECHOCARDIOGRAPHY  12/29/2002   EF 60-65%  . VAGINAL HYSTERECTOMY     HPI:  86 yo female adm to [facility] with FTT and respiratory deficits.  Pt had been on hospice care at home but it was rescinded and pt was sent to hospital for symptoms.  PMH + for HTN, CKD, UTI, gout, anemia, DM, cardiac arrythmias.  Pt found to be COVID +.  CXR showed worsening mid to lower lung ASD compatible with pna 05/04/2020.  Swallow eval ordered.   Assessment / Plan  / Recommendation Clinical Impression  Patient presents with signs of mild oral dysphagia - due to weakness and edentulous status.  She slowly masticated soft cereal bar and only accepted two small bites.  No indications of aspiration with pt consumed including approx 1/3 of an Ensure, 2 straw boluses of water and 2 bites of a cereal bar.  Pt declined to consume any more po intake- even of her meal.   Recommend to modify diet to dys3/thin and maximize liquid nutrition for efficiency.  SLP left pt with 50s music playing and floated her arms on pillows for maximum comfort.  No SLP follow up needed. SLP Visit Diagnosis: Dysphagia, oral phase (R13.11)    Aspiration Risk  Mild aspiration risk    Diet Recommendation Dysphagia 3 (Mech soft);Thin liquid   Liquid Administration via: Cup;Straw Medication Administration: Whole meds with liquid Supervision: Full supervision/cueing for compensatory strategies Compensations: Slow rate;Small sips/bites Postural Changes: Seated upright at 90 degrees;Remain upright for at least 30 minutes after po intake    Other  Recommendations Oral Care Recommendations: Oral care BID   Follow up Recommendations None      Frequency and Duration   n/a         Prognosis   n/a     Swallow Study   General Date of Onset: 05/21/20 HPI: 86 yo female adm to  [facility] with FTT and respiratory deficits.  Pt had been on hospice care at home but it was rescinded and pt was sent to hospital for symptoms.  PMH + for HTN, CKD, UTI, gout, anemia, DM, cardiac arrythmias.  Pt found to be COVID +.  CXR showed worsening mid to lower lung ASD compatible with pna 05/12/2020.  Swallow eval ordered. Type of Study: Bedside Swallow Evaluation Previous Swallow Assessment: none in system Diet Prior to this Study: Regular;Thin liquids Temperature Spikes Noted: No Respiratory Status: Nasal cannula History of Recent Intubation: No Behavior/Cognition: Alert;Confused;Other (Comment) (pt tearful at  times due to the loss of her husband) Oral Cavity Assessment: Other (comment) (pt did not follow directions for oral motor exam, barely opened her mouth to accept few bites/sips) Oral Care Completed by SLP: No Self-Feeding Abilities: Total assist Patient Positioning: Upright in bed Baseline Vocal Quality: Low vocal intensity Volitional Cough: Cognitively unable to elicit Volitional Swallow: Unable to elicit    Oral/Motor/Sensory Function Overall Oral Motor/Sensory Function: Within functional limits (from tasks pt completed to allow evaluation)   Ice Chips Ice chips: Not tested   Thin Liquid Thin Liquid: Within functional limits Presentation: Straw    Nectar Thick Nectar Thick Liquid: Within functional limits Presentation: Straw   Honey Thick Honey Thick Liquid: Not tested   Puree Puree: Not tested Other Comments: pt declined to consume any puree   Solid     Solid: Impaired Oral Phase Impairments: Poor awareness of bolus;Impaired mastication Other Comments: pt does not have dentures and is edentulous - therefore she is not appropriate for hard solids, prolonge d mastication and clinical delay in swallow with soft cereal bar      Macario Golds 05/21/2020,2:24 PM   Kathleen Lime, MS [facility] SLP [place] Office [phone] Pager [phone]

## 2020-05-22 DIAGNOSIS — N184 Chronic kidney disease, stage 4 (severe): Secondary | ICD-10-CM | POA: Diagnosis not present

## 2020-05-22 DIAGNOSIS — J189 Pneumonia, unspecified organism: Secondary | ICD-10-CM | POA: Diagnosis not present

## 2020-05-22 DIAGNOSIS — Z7189 Other specified counseling: Secondary | ICD-10-CM | POA: Diagnosis not present

## 2020-05-22 DIAGNOSIS — U071 COVID-19: Secondary | ICD-10-CM | POA: Diagnosis not present

## 2020-05-22 DIAGNOSIS — Z515 Encounter for palliative care: Secondary | ICD-10-CM

## 2020-05-22 DIAGNOSIS — R627 Adult failure to thrive: Secondary | ICD-10-CM | POA: Diagnosis not present

## 2020-05-22 LAB — CBC WITH DIFFERENTIAL/PLATELET
Abs Immature Granulocytes: 0.05 10*3/uL (ref 0.00–0.07)
Basophils Absolute: 0 10*3/uL (ref 0.0–0.1)
Basophils Relative: 0 %
Eosinophils Absolute: 0 10*3/uL (ref 0.0–0.5)
Eosinophils Relative: 0 %
HCT: 26.6 % — ABNORMAL LOW (ref 36.0–46.0)
Hemoglobin: 8.8 g/dL — ABNORMAL LOW (ref 12.0–15.0)
Immature Granulocytes: 0 %
Lymphocytes Relative: 4 %
Lymphs Abs: 0.4 10*3/uL — ABNORMAL LOW (ref 0.7–4.0)
MCH: 27.9 pg (ref 26.0–34.0)
MCHC: 33.1 g/dL (ref 30.0–36.0)
MCV: 84.4 fL (ref 80.0–100.0)
Monocytes Absolute: 0.2 10*3/uL (ref 0.1–1.0)
Monocytes Relative: 1 %
Neutro Abs: 11.2 10*3/uL — ABNORMAL HIGH (ref 1.7–7.7)
Neutrophils Relative %: 95 %
Platelets: 127 10*3/uL — ABNORMAL LOW (ref 150–400)
RBC: 3.15 MIL/uL — ABNORMAL LOW (ref 3.87–5.11)
RDW: 17.9 % — ABNORMAL HIGH (ref 11.5–15.5)
WBC: 11.9 10*3/uL — ABNORMAL HIGH (ref 4.0–10.5)
nRBC: 0 % (ref 0.0–0.2)

## 2020-05-22 LAB — COMPREHENSIVE METABOLIC PANEL
ALT: 36 U/L (ref 0–44)
AST: 41 U/L (ref 15–41)
Albumin: 2.3 g/dL — ABNORMAL LOW (ref 3.5–5.0)
Alkaline Phosphatase: 178 U/L — ABNORMAL HIGH (ref 38–126)
Anion gap: 13 (ref 5–15)
BUN: 60 mg/dL — ABNORMAL HIGH (ref 8–23)
CO2: 13 mmol/L — ABNORMAL LOW (ref 22–32)
Calcium: 8 mg/dL — ABNORMAL LOW (ref 8.9–10.3)
Chloride: 112 mmol/L — ABNORMAL HIGH (ref 98–111)
Creatinine, Ser: 2.93 mg/dL — ABNORMAL HIGH (ref 0.44–1.00)
GFR, Estimated: 15 mL/min — ABNORMAL LOW (ref >60)
Glucose, Bld: 280 mg/dL — ABNORMAL HIGH (ref 70–99)
Potassium: 4.3 mmol/L (ref 3.5–5.1)
Sodium: 138 mmol/L (ref 135–145)
Total Bilirubin: 0.5 mg/dL (ref 0.3–1.2)
Total Protein: 5.8 g/dL — ABNORMAL LOW (ref 6.5–8.1)

## 2020-05-22 LAB — GLUCOSE, CAPILLARY
Glucose-Capillary: 277 mg/dL — ABNORMAL HIGH (ref 70–99)
Glucose-Capillary: 374 mg/dL — ABNORMAL HIGH (ref 70–99)

## 2020-05-22 LAB — FERRITIN: Ferritin: 533 ng/mL — ABNORMAL HIGH (ref 11–307)

## 2020-05-22 LAB — C-REACTIVE PROTEIN: CRP: 15.7 mg/dL — ABNORMAL HIGH (ref <1.0)

## 2020-05-22 LAB — D-DIMER, QUANTITATIVE: D-Dimer, Quant: 3.02 ug/mL-FEU — ABNORMAL HIGH (ref 0.00–0.50)

## 2020-05-22 MED ORDER — ACETAMINOPHEN 650 MG RE SUPP: 650.0000 mg | Freq: Four times a day (QID) | RECTAL | Status: DC | PRN

## 2020-05-22 MED ORDER — POLYVINYL ALCOHOL 1.4 % OP SOLN
1.0000 [drp] | Freq: Four times a day (QID) | OPHTHALMIC | Status: DC | PRN
  Filled 2020-05-22: qty 15

## 2020-05-22 MED ORDER — ACETAMINOPHEN 325 MG PO TABS: 650.0000 mg | ORAL_TABLET | Freq: Four times a day (QID) | ORAL | Status: DC | PRN

## 2020-05-22 MED ORDER — GLUCERNA SHAKE PO LIQD
237.0000 mL | Freq: Three times a day (TID) | ORAL | Status: DC
  Filled 2020-05-22 (×2): qty 237

## 2020-05-22 MED ORDER — HYDROMORPHONE HCL 1 MG/ML IJ SOLN
0.5000 mg | INTRAMUSCULAR | Status: DC | PRN
  Administered 2020-05-22 – 2020-05-25 (×13): 0.5 mg via INTRAVENOUS
  Filled 2020-05-22 (×13): qty 0.5

## 2020-05-22 MED ORDER — HALOPERIDOL LACTATE 5 MG/ML IJ SOLN
1.0000 mg | INTRAMUSCULAR | Status: DC | PRN
  Administered 2020-05-25: 1 mg via INTRAVENOUS
  Filled 2020-05-22: qty 1

## 2020-05-22 MED ORDER — ADULT MULTIVITAMIN W/MINERALS CH: 1.0000 | ORAL_TABLET | Freq: Every day | ORAL | Status: DC

## 2020-05-22 MED ORDER — LORAZEPAM 2 MG/ML IJ SOLN
1.0000 mg | INTRAMUSCULAR | Status: DC | PRN
  Administered 2020-05-22 – 2020-05-24 (×6): 1 mg via INTRAVENOUS
  Filled 2020-05-22 (×6): qty 1

## 2020-05-22 MED ORDER — LORAZEPAM 1 MG PO TABS: 1.0000 mg | ORAL_TABLET | ORAL | Status: DC | PRN

## 2020-05-22 MED ORDER — GLYCOPYRROLATE 1 MG PO TABS
1.0000 mg | ORAL_TABLET | ORAL | Status: DC | PRN
  Filled 2020-05-22: qty 1

## 2020-05-22 MED ORDER — GLYCOPYRROLATE 0.2 MG/ML IJ SOLN
0.2000 mg | INTRAMUSCULAR | Status: DC | PRN
  Filled 2020-05-22: qty 1

## 2020-05-22 MED ORDER — HALOPERIDOL LACTATE 2 MG/ML PO CONC
0.5000 mg | ORAL | Status: DC | PRN
  Filled 2020-05-22: qty 0.3

## 2020-05-22 MED ORDER — LIP MEDEX EX OINT
TOPICAL_OINTMENT | CUTANEOUS | Status: AC
  Filled 2020-05-22: qty 7

## 2020-05-22 MED ORDER — FENTANYL 12 MCG/HR TD PT72
1.0000 | MEDICATED_PATCH | TRANSDERMAL | Status: DC
  Administered 2020-05-22 – 2020-05-25 (×2): 1 via TRANSDERMAL
  Filled 2020-05-22 (×2): qty 1

## 2020-05-22 MED ORDER — LORAZEPAM 2 MG/ML PO CONC: 1.0000 mg | ORAL | Status: DC | PRN

## 2020-05-22 MED ORDER — BIOTENE DRY MOUTH MT LIQD: 15.0000 mL | OROMUCOSAL | Status: DC | PRN

## 2020-05-22 MED ORDER — HALOPERIDOL 0.5 MG PO TABS
0.5000 mg | ORAL_TABLET | ORAL | Status: DC | PRN
  Filled 2020-05-22: qty 1

## 2020-05-22 NOTE — Progress Notes (Signed)
Pt refused 10pm meds closing mouth tightly stating "I dont want that". While pt was yelling "I dont want that" I had to place O2 on pt as her sats were 84% on RA. coming back up to 90% on 2lnc. I tried a second time to try and give pt her oral meds and she continues to refuse to open mouth for anything.

## 2020-05-22 NOTE — Progress Notes (Signed)
[facility]  PROGRESS NOTE  Leslie Coleman UUV:[id number] DOB: 1929-03-07 DOA: 05/19/2020 PCP: System, Provider Not In   Brief HPI:   87 year old female who is unvaccinated against COVID-19, past medical history of hypertension, hyperlipidemia, hypothyroidism, diabetes mellitus type 2, V. tach initially treated with procainamide and now on amiodarone, stress incontinence, CKD stage IV presented with failure to thrive for several days.  She was diagnosed with COVID-19 infection on 05/04/2020 and was given multiple antibodies however she was admitted on 05/09/2020 and patient was discharged home her husband was dying at home under hospice care.  Patient has been confused, coughing with poor p.o. intake. Patient was admitted with failure to thrive, acute hypoxemic respiratory failure due to COVID-19 pneumonia.   Subjective   Patient seen and examined, complains of generalized pain.  She has not been keeping her oxygen on, not compliant with taking her medications.    Assessment/Plan:     1. Acute hypoxemic respiratory failure-secondary to COVID-19 pneumonia also concern for community-acquired pneumonia.  Patient is currently on ceftriaxone, Zithromax.  She is also been started on remdesivir per pharmacy consultation.  Continue Solu-Medrol 0.5 mg/kg every 12 hours.  Incentive spirometry, flutter valve.  Patient CRP is down to 15.0.  Patient was also started on baricitinib.   After discussion with family, patient has been made full comfort care.  Covid treatment has been discontinued.  Will discontinue labs for tomorrow morning. 2. Failure to thrive-secondary to 1, speech therapy evaluation done today.  Patient started on dysphagia 3 thin liquid diet.  Encourage p.o. intake. 3. CKD stage IV-creatinine has been slowly trending upwards over the past 1 year, likely at baseline. 4. Elevated troponin-mild elevation of troponin, likely from demand ischemia. 5. Diabetes mellitus type 2-continue  sliding scale insulin NovoLog. 6. History of VT-continue amiodarone 7. Hypertension-continue amlodipine 8. Goals of care-palliative care discussed with patient's family, patient is made full comfort care.  Will stop treatment for COVID-19 pneumonia.  Patient to go to residential hospice,  when bed available.     [place]    05/18/2020 1347 04/29/2020 1454 05/21/20 0405 05/22/20 0427  DDIMER 3.31*  --  3.26* 3.02*  FERRITIN 380*  --  434* 533*  LDH  --  269*  --   --   CRP 17.7*  --  22.9* 15.7*    Lab Results  Component Value Date   SARSCOV2NAA POSITIVE (A) 05/04/2020   [place] NEGATIVE 04/29/2020     Scheduled medications:   . amiodarone  100 mg Oral Daily  . fentaNYL  1 patch Transdermal Q72H  . furosemide  20 mg Oral Daily  . Ipratropium-Albuterol  1 puff Inhalation Q6H  . melatonin  5 mg Oral QHS  . methylPREDNISolone (SOLU-MEDROL) injection  0.5 mg/kg Intravenous Q12H   Followed by  . [START ON 05/23/2020] predniSONE  50 mg Oral Daily  . sodium chloride flush  3 mL Intravenous Q12H         CBG: Recent Labs  Lab 05/21/20 1158 05/21/20 1633 05/21/20 2248 05/22/20 0806 05/22/20 1158  GLUCAP 231* 214* 195* 277* 374*    SpO2: 92 % O2 Flow Rate (L/min): 5 L/min    CBC: Recent Labs  Lab 05/12/2020 1317 05/21/20 0405 05/22/20 0427  WBC 17.0* 10.7* 11.9*  NEUTROABS 15.7* 10.0* 11.2*  HGB 9.0* 8.8* 8.8*  HCT 27.4* 26.8* 26.6*  MCV 84.3 84.5 84.4  PLT 112* 97* 127*    Basic Metabolic Panel: Recent Labs  Lab 05/03/2020 1317 05/21/20 0405 05/22/20 0427  NA 135 136 138  K 4.3 4.4 4.3  CL 111 110 112*  CO2 14* 14* 13*  GLUCOSE 273* 340* 280*  BUN 44* 50* 60*  CREATININE 2.67* 2.59* 2.93*  CALCIUM 8.0* 7.9* 8.0*     Liver Function Tests: Recent Labs  Lab 05/01/2020 1317 05/21/20 0405 05/22/20 0427  AST 35 30 41  ALT 29 29 36  ALKPHOS 181* 164* 178*  BILITOT 0.9 0.6 0.5  PROT 6.1* 5.8* 5.8*  ALBUMIN 2.5* 2.3* 2.3*      Antibiotics: Anti-infectives (From admission, onward)   Start     Dose/Rate Route Frequency Ordered Stop   05/21/20 1600  cefTRIAXone (ROCEPHIN) 2 g in sodium chloride 0.9 % 100 mL IVPB  Status:  Discontinued        2 g 200 mL/hr over 30 Minutes Intravenous Every 24 hours 05/17/2020 1744 05/22/20 1229   05/21/20 1600  azithromycin (ZITHROMAX) 500 mg in sodium chloride 0.9 % 250 mL IVPB  Status:  Discontinued        500 mg 250 mL/hr over 60 Minutes Intravenous Every 24 hours 04/30/2020 1744 05/22/20 1229   05/21/20 1000  remdesivir 100 mg in sodium chloride 0.9 % 100 mL IVPB  Status:  Discontinued       "Followed by" Linked Group Details   100 mg 200 mL/hr over 30 Minutes Intravenous Daily 04/28/2020 1743 05/22/20 1229   05/10/2020 1930  remdesivir 200 mg in sodium chloride 0.9% 250 mL IVPB       "Followed by" Linked Group Details   200 mg 580 mL/hr over 30 Minutes Intravenous Once 05/08/2020 1743 05/11/2020 2046   05/19/2020 1515  cefTRIAXone (ROCEPHIN) 1 g in sodium chloride 0.9 % 100 mL IVPB        1 g 200 mL/hr over 30 Minutes Intravenous  Once 04/28/2020 1508 05/08/2020 1626   05/01/2020 1515  azithromycin (ZITHROMAX) 500 mg in sodium chloride 0.9 % 250 mL IVPB        500 mg 250 mL/hr over 60 Minutes Intravenous  Once 04/21/2020 1508 04/21/2020 1707       DVT prophylaxis: Apixaban  Code Status: Full code  Family Communication: No family at bedside   Consultants:    Procedures:      Objective   Vitals:   05/21/20 2249 05/22/20 0300 05/22/20 0500 05/22/20 0632  BP:    101/84  Pulse:  84 73 79  Resp:  (!) 24 18 20   Temp:    98.1 F (36.7 C)  TempSrc:    Oral  SpO2: 90% 90%  92%    Intake/Output Summary (Last 24 hours) at 05/22/2020 1557 Last data filed at 05/22/2020 1200 Gross per 24 hour  Intake 688 ml  Output 650 ml  Net 38 ml    12/30 1901 - 01/01 0700 In: 1610 [P.O.:472] Out: 700 [Urine:700]  There were no vitals filed for this visit.  Physical  Examination:   General-appears in no acute distress Heart-S1-S2, regular, no murmur auscultated Lungs-clear to auscultation bilaterally, no wheezing or crackles auscultated Abdomen-soft, nontender, no organomegaly Extremities-no edema in the lower extremities Neuro-alert, oriented x3, no focal deficit noted  Status is: Inpatient  Dispo: The patient is from: Home              Anticipated d/c is to: Home              Anticipated d/c date is: 05/25/2019  Patient currently not stable for discharge  Barrier to discharge-treatment for COVID-19 pneumonia  Pressure Injury 10/19/16 Stage I -  Intact skin with non-blanchable redness of a localized area usually over a bony prominence. (Active)  10/19/16 1741  Location: Buttocks  Location Orientation: Medial  Staging: Stage I -  Intact skin with non-blanchable redness of a localized area usually over a bony prominence.  Wound Description (Comments):   Present on Admission: Yes           Data Reviewed:   Recent Results (from the past 240 hour(s))  Culture, blood (routine x 2)     Status: Abnormal (Preliminary result)   Collection Time: 04/29/2020  1:47 PM   Specimen: BLOOD LEFT FOREARM  Result Value Ref Range Status   Specimen Description   Final    BLOOD LEFT FOREARM Performed at [facility] [address] [place], [place] [address]    Special Requests   Final    BOTTLES DRAWN AEROBIC AND ANAEROBIC Blood Culture adequate volume Performed at [facility] [address] [place], [place] [address]    Culture  Setup Time   Final    GRAM POSITIVE COCCI IN CLUSTERS IN BOTH AEROBIC AND ANAEROBIC BOTTLES Organism ID to follow CRITICAL RESULT CALLED TO, READ BACK BY AND VERIFIED WITH: Guadlupe Spanish PharmD 15:05 05/21/20 ([email or web address])    Culture (A)  Final    STAPHYLOCOCCUS EPIDERMIDIS THE SIGNIFICANCE OF ISOLATING THIS ORGANISM FROM A SINGLE SET OF BLOOD CULTURES WHEN MULTIPLE SETS ARE  DRAWN IS UNCERTAIN. PLEASE NOTIFY THE MICROBIOLOGY DEPARTMENT WITHIN ONE WEEK IF SPECIATION AND SENSITIVITIES ARE REQUIRED. Performed at [facility], [place] [address] [place], [place] [address]    Report Status PENDING  Incomplete  Culture, blood (routine x 2)     Status: None (Preliminary result)   Collection Time: 04/22/2020  1:47 PM   Specimen: BLOOD  Result Value Ref Range Status   Specimen Description   Final    BLOOD RIGHT ANTECUBITAL Performed at [facility] [address] [place], [place] [address]    Special Requests   Final    BOTTLES DRAWN AEROBIC AND ANAEROBIC Blood Culture adequate volume Performed at [facility] [address], [place], [place] [address]    Culture   Final    NO GROWTH 2 DAYS Performed at [facility] [address] [place], [place] [address]    Report Status PENDING  Incomplete  Blood Culture ID Panel (Reflexed)     Status: Abnormal   Collection Time: 05/04/2020  1:47 PM  Result Value Ref Range Status   Enterococcus faecalis NOT DETECTED NOT DETECTED Final   Enterococcus Faecium NOT DETECTED NOT DETECTED Final   Listeria monocytogenes NOT DETECTED NOT DETECTED Final   Staphylococcus species DETECTED (A) NOT DETECTED Final    Comment: CRITICAL RESULT CALLED TO, READ BACK BY AND VERIFIED WITH: Guadlupe Spanish PharmD 15:05 05/21/20 ([email or web address])    Staphylococcus aureus (BCID) NOT DETECTED NOT DETECTED Final   Staphylococcus epidermidis DETECTED (A) NOT DETECTED Final    Comment: Methicillin (oxacillin) resistant coagulase negative staphylococcus. Possible blood culture contaminant (unless isolated from more than one blood culture draw or clinical case suggests pathogenicity). No antibiotic treatment is indicated for blood  culture contaminants. CRITICAL RESULT CALLED TO, READ BACK BY AND VERIFIED WITH: Guadlupe Spanish PharmD 15:05 05/21/20 ([email or web address])    Staphylococcus lugdunensis NOT DETECTED NOT DETECTED  Final   Streptococcus species NOT  DETECTED NOT DETECTED Final   Streptococcus agalactiae NOT DETECTED NOT DETECTED Final   Streptococcus pneumoniae NOT DETECTED NOT DETECTED Final   Streptococcus pyogenes NOT DETECTED NOT DETECTED Final   A.calcoaceticus-baumannii NOT DETECTED NOT DETECTED Final   Bacteroides fragilis NOT DETECTED NOT DETECTED Final   Enterobacterales NOT DETECTED NOT DETECTED Final   Enterobacter cloacae complex NOT DETECTED NOT DETECTED Final   Escherichia coli NOT DETECTED NOT DETECTED Final   Klebsiella aerogenes NOT DETECTED NOT DETECTED Final   Klebsiella oxytoca NOT DETECTED NOT DETECTED Final   Klebsiella pneumoniae NOT DETECTED NOT DETECTED Final   Proteus species NOT DETECTED NOT DETECTED Final   Salmonella species NOT DETECTED NOT DETECTED Final   Serratia marcescens NOT DETECTED NOT DETECTED Final   Haemophilus influenzae NOT DETECTED NOT DETECTED Final   Neisseria meningitidis NOT DETECTED NOT DETECTED Final   Pseudomonas aeruginosa NOT DETECTED NOT DETECTED Final   Stenotrophomonas maltophilia NOT DETECTED NOT DETECTED Final   Candida albicans NOT DETECTED NOT DETECTED Final   Candida auris NOT DETECTED NOT DETECTED Final   Candida glabrata NOT DETECTED NOT DETECTED Final   Candida krusei NOT DETECTED NOT DETECTED Final   Candida parapsilosis NOT DETECTED NOT DETECTED Final   Candida tropicalis NOT DETECTED NOT DETECTED Final   Cryptococcus neoformans/gattii NOT DETECTED NOT DETECTED Final   Methicillin resistance mecA/C DETECTED (A) NOT DETECTED Final    Comment: CRITICAL RESULT CALLED TO, READ BACK BY AND VERIFIED WITH: Guadlupe Spanish PharmD 15:05 05/21/20 ([email or web address]) Performed at [facility], [address], [place], [place] [address]     No results for input(s): LIPASE, AMYLASE in the last 168 hours. No results for input(s): AMMONIA in the last 168 hours.  Cardiac Enzymes: No results for input(s): CKTOTAL, CKMB, CKMBINDEX, TROPONINI in the  last 168 hours. BNP (last 3 results) Recent Labs    04/30/2020 1317  BNP 300.7*    ProBNP (last 3 results) No results for input(s): PROBNP in the last 8760 hours.  Studies:  No results found.     Oswald Hillock   [facility] If 7PM-7AM, please contact night-coverage at [email or web address], Office  [phone]   05/22/2020, 3:57 PM  LOS: 2 days

## 2020-05-22 NOTE — Progress Notes (Signed)
Daily Progress Note   Patient Name: Leslie Coleman       Date: 05/22/2020 DOB: [birth year]  Age: 87 y.o. MRN#: [id number] Attending Physician: Oswald Hillock, MD Primary Care Physician: System, Provider Not In Admit Date: 04/28/2020  Reason for Consultation/Follow-up: Establishing goals of care  Subjective: I saw and examined Leslie Coleman.  She was agitated and could not participate in conversation regarding goals of care.  I called and was able to reach patient's daughter, [name].  We discussed Leslie Coleman's clinical course as well as wishes moving forward in regard to care plan this hospitalization.  Discussed that Leslie Coleman was actually going to enroll in hospice services at home, but family also lost Leslie husband last week and his funeral was yesterday.  Family therefore had elected to have Leslie come to the hospital but understand that she is approaching end of life. We discussed difference between a aggressive medical intervention path and a palliative, comfort focused care path.  Values and goals of care important to patient and family were attempted to be elicited.  Concept of Hospice and Palliative Care were discussed.  Questions and concerns addressed.   PMT will continue to support holistically.  Length of Stay: 2  Current Medications: Scheduled Meds:  . amiodarone  100 mg Oral Daily  . fentaNYL  1 patch Transdermal Q72H  . furosemide  20 mg Oral Daily  . Ipratropium-Albuterol  1 puff Inhalation Q6H  . lip balm      . melatonin  5 mg Oral QHS  . methylPREDNISolone (SOLU-MEDROL) injection  0.5 mg/kg Intravenous Q12H   Followed by  . [START ON 05/23/2020] predniSONE  50 mg Oral Daily  . sodium chloride flush  3 mL Intravenous Q12H    Continuous Infusions:   PRN  Meds: acetaminophen **OR** acetaminophen, antiseptic oral rinse, glycopyrrolate **OR** glycopyrrolate **OR** glycopyrrolate, guaiFENesin-dextromethorphan, haloperidol **OR** haloperidol **OR** haloperidol lactate, HYDROmorphone (DILAUDID) injection, LORazepam **OR** LORazepam **OR** LORazepam, polyvinyl alcohol  Physical Exam         General: Alert, awake, Agitated and yelling out.  HEENT: No bruits, no goiter, no JVD Heart: Regular rate and rhythm. No murmur appreciated. Lungs: Good air movement, clear Abdomen: Soft, nontender, nondistended, positive bowel sounds.  Ext: No significant edema Skin: Warm and dry  Vital Signs: BP (!) 125/50 (BP  Location: Right Leg)   Pulse 70   Temp 97.7 F (36.5 C) (Oral)   Resp 16   SpO2 91%  SpO2: SpO2: 91 % O2 Device: O2 Device: Nasal Cannula O2 Flow Rate: O2 Flow Rate (L/min): 5 L/min  Intake/output summary:   Intake/Output Summary (Last 24 hours) at 05/22/2020 2109 Last data filed at 05/22/2020 1756 Gross per 24 hour  Intake 120 ml  Output 350 ml  Net -230 ml   LBM: Last BM Date: 05/22/20 Baseline Weight:   Most recent weight:      Flowsheet Rows   Flowsheet Row Most Recent Value  Intake Tab   Referral Department Hospitalist  Unit at Time of Referral Med/Surg Unit  Palliative Care Primary Diagnosis Sepsis/Infectious Disease  Date Notified 04/30/2020  Palliative Care Type New Palliative care  Reason for referral Clarify Goals of Care  Date of Admission 05/10/2020  Date first seen by Palliative Care 05/21/20  # of days Palliative referral response time 1 Day(s)  # of days IP prior to Palliative referral 0  Clinical Assessment   Palliative Performance Scale Score 30%  Psychosocial & Spiritual Assessment   Palliative Care Outcomes       Patient Active Problem List   Diagnosis Date Noted  . COVID-19 05/09/2020  . Failure to thrive in adult 05/09/2020  . CAP (community acquired pneumonia) 05/09/2020  . Debility 05/09/2020  .  Acute kidney injury ([place]) 10/19/2016  . Dizziness 10/19/2016  . CKD (chronic kidney disease) stage 4, GFR 15-29 ml/min (HCC) 10/19/2016  . Hypothyroidism, adult 10/19/2016  . Abnormal urinalysis 10/19/2016  . Diabetes mellitus type 2, uncontrolled ([place]) 10/19/2016  . Anemia due to chronic kidney disease 10/19/2016  . Thrombocytopenia ([place]) 10/19/2016  . Candidal dermatitis 10/19/2016  . Stress incontinence 10/19/2016  . V-tach ([place]) 10/19/2016  . Pressure injury of skin 10/19/2016  . Diabetes mellitus with complication ([place])   . Urinary tract infection without hematuria   . CKD (chronic kidney disease), stage III ([place]) 05/20/2016  . Hypothyroidism 05/20/2016  . Closed fracture of right tibial plateau 05/12/2016  . Anemia in chronic kidney disease 12/16/2015  . Dizzy 07/07/2011  . Dehydration 07/07/2011  . Type II diabetes mellitus ([place]) 07/06/2011  . History of long-term treatment with high-risk medication 08/29/2010  . Abnormal PFTs (pulmonary function tests)   . History of ventricular tachycardia   . HTN (hypertension)   . Hyperlipidemia   . Gout     Palliative Care Assessment & Plan   Recommendations/Plan:  DNR/DNI  Plan to transition to full comfort.  This includes discontinuation of antibiotics and treatment for COVID.  Family understands likely prognosis of days to less than 2 weeks.  If she stabilizes and reaches point of being able to be out of isolation, would recommend residential hospice for end of life care.  Orders for comfort updated using end of life order set  Visitation per end of life policy  Will follow up tomorrow for evaluation for symptom management.  Goals of Care and Additional Recommendations:  Limitations on Scope of Treatment: Full Comfort Care  Code Status:    Code Status Orders  (From admission, onward)         Start     Ordered   05/22/20 1231  Do not attempt resuscitation (DNR)  Continuous       Question Answer Comment  In the  event of cardiac or respiratory ARREST Do not call a "code blue"   In the event of  cardiac or respiratory ARREST Do not perform Intubation, CPR, defibrillation or ACLS   In the event of cardiac or respiratory ARREST Use medication by any route, position, wound care, and other measures to relive pain and suffering. May use oxygen, suction and manual treatment of airway obstruction as needed for comfort.      05/22/20 1232        Code Status History    Date Active Date Inactive Code Status Order ID Comments User Context   05/15/2020 1747 05/22/2020 1232 DNR [id number]  Harold Hedge, MD ED   05/09/2020 1422 05/10/2020 2114 Full Code [id number]  Harold Hedge, MD ED   10/19/2016 1736 10/20/2016 2030 Full Code [id number]  Samella Parr, NP Inpatient   05/08/2016 1936 05/12/2016 1902 Full Code [id number]  Eber Jones, MD Inpatient   Advance Care Planning Activity    Advance Directive Documentation   Flowsheet Row Most Recent Value  Type of Advance Directive Out of facility DNR (pink MOST or yellow form)  Pre-existing out of facility DNR order (yellow form or pink MOST form) Yellow form placed in chart (order not valid for inpatient use)  "MOST" Form in Place? -       Prognosis:   < 2 weeks  Discharge Planning:  Will be hospital death vs residential hospice  Care plan was discussed with daughter, Dr. Darrick Meigs  Thank you for allowing the Palliative Medicine Team to assist in the care of this patient.   Time In: 1200 Time Out: 1240 Total Time 40 Prolonged Time Billed  No      Greater than 50%  of this time was spent counseling and coordinating care related to the above assessment and plan.  Micheline Rough, MD  Please contact Palliative Medicine Team phone at [phone] for questions and concerns.

## 2020-05-22 NOTE — Plan of Care (Signed)
  Problem: Education: Goal: Knowledge of risk factors and measures for prevention of condition will improve Outcome: Not Progressing   Problem: Coping: Goal: Psychosocial and spiritual needs will be supported Outcome: Not Progressing   Problem: Respiratory: Goal: Will maintain a patent airway Outcome: Not Progressing Goal: Complications related to the disease process, condition or treatment will be avoided or minimized Outcome: Not Progressing   Problem: Clinical Measurements: Goal: Will remain free from infection Outcome: Not Progressing Goal: Diagnostic test results will improve Outcome: Not Progressing Goal: Respiratory complications will improve Outcome: Not Progressing   Problem: Elimination: Goal: Will not experience complications related to bowel motility Outcome: Not Progressing Goal: Will not experience complications related to urinary retention Outcome: Not Progressing   Problem: Pain Managment: Goal: General experience of comfort will improve Outcome: Not Progressing   Problem: Safety: Goal: Ability to remain free from injury will improve Outcome: Not Progressing   Problem: Skin Integrity: Goal: Risk for impaired skin integrity will decrease Outcome: Not Progressing

## 2020-05-22 NOTE — Progress Notes (Signed)
[facility]uring [facility]) Hospice  Hospice was to enroll Mrs. Leslie Coleman on 12/30  for hospice services. When our RN arrived, the family was wavering on hospice in light of her husband passing recently.  Family decided to send Leslie Coleman to the hospital for further treatments, if possible. Patient admitted for Covid and Failure to thrive.  Leslie Coleman is not under hospice care at this time.  [facility] will follow for discharge planning and anticipate starting hospice when she d/c's.  Clementeen Hoof, BSN, RN [facility] Liaison

## 2020-05-22 NOTE — Consult Note (Signed)
Consultation Note Date: 05/22/2020   Patient Name: Leslie Coleman  DOB: 03-Feb-1929  MRN: [id number]  Age / Sex: 87 y.o., female  PCP: System, Provider Not In Referring Physician: Oswald Hillock, MD  Reason for Consultation: Establishing goals of care  HPI/Patient Profile: 87 y.o. female  with past medical history of hypertension, hyperlipidemia, hypothyroidism, diabetes, V. tach, stress incontinence, CKD stage IV admitted on 04/25/2020 with failure to thrive and continued functional decline.  She was diagnosed with COVID-19 on 12/14 and given multiple antibodies however she was admitted to the hospital 12/19 but discharged home as patient was dying at home under hospice care.  She has remained confused and coughing with poor intake and was readmitted to the hospital with COVID-19 pneumonia.  Chart review reveals that plan had been to enroll her in hospice services at home, however, the patient's husband's funeral is today and family had discussed with hospice and ended up sending her to the hospital following discussion with hospice agency.  Palliative consulted for goals of care..   Clinical Assessment and Goals of Care: Palliative care consult received.  Chart reviewed including personal review of pertinent labs and imaging.  I saw and examined Leslie Coleman today.  Attempted to discuss with her regarding her understanding of her situation, however, she is not really able to participate in conversation regarding goals of care.  She goes back and forth from answering simple questions to screaming out per report of nursing staff caring for her.  She was calm during time of my encounter.  SUMMARY OF RECOMMENDATIONS   -DNR/DNI -Chart reveals plan was initially for hospice services at home, however, patient recently lost her husband and his funeral was actually today. -I did not reach family today via phone.  As noted,  funeral for patient's husband was today so I am sure family is otherwise occupied as they are dealing with grieving his loss.  -She appears comfortable during time of my encounter this evening. - Will plan to reach out again tomorrow  Code Status/Advance Care Planning:  DNR  Psycho-social/Spiritual:   Desire for further Chaplaincy support:no  Additional Recommendations: Education on Hospice  Prognosis:   Unable to determine  Discharge Planning: To Be Determined      Primary Diagnoses: Present on Admission: . Failure to thrive in adult . CAP (community acquired pneumonia) . CKD (chronic kidney disease) stage 4, GFR 15-29 ml/min (HCC) . COVID-19 . Debility . Dehydration . HTN (hypertension) . Hyperlipidemia   I have reviewed the medical record, interviewed the patient and family, and examined the patient. The following aspects are pertinent.  Past Medical History:  Diagnosis Date  . Abnormal PFTs (pulmonary function tests) 2008   No obstruction noted. Has been followed clinically.  . Anemia    treated with Procrit  . Fall from slip, trip, or stumble 05/07/2016   fell at [employer] resulting in right tibial plateau fracture  . Gout   . High risk medication use    on amiodarone.  Marland Kitchen HTN (hypertension)  Last echo in 2004; EF normal  . Hyperlipidemia   . Hypothyroidism    on replacement  . Irregular heart beat "since 1985"  . Obesity   . Type II diabetes mellitus ([place])   . V-tach [facility])    remote VT, treated initially with procainamide, on amiodarone since 2008   Social History   Socioeconomic History  . Marital status: Married    Spouse name: Not on file  . Number of children: Not on file  . Years of education: Not on file  . Highest education level: Not on file  Occupational History  . Not on file  Tobacco Use  . Smoking status: Never Smoker  . Smokeless tobacco: Never Used  Vaping Use  . Vaping Use: Never used  Substance and Sexual Activity  .  Alcohol use: No  . Drug use: No  . Sexual activity: Not Currently  Other Topics Concern  . Not on file  Social History Narrative   Lives with spouse in [place]   Social Determinants of Health   Financial Resource Strain: Not on file  Food Insecurity: Not on file  Transportation Needs: Not on file  Physical Activity: Not on file  Stress: Not on file  Social Connections: Not on file   Family History  Problem Relation Age of Onset  . Heart disease Mother   . Heart disease Father    Scheduled Meds: . amiodarone  100 mg Oral Daily  . amLODipine  5 mg Oral Daily  . apixaban  2.5 mg Oral BID  . aspirin EC  81 mg Oral Daily  . baricitinib  1 mg Oral Daily  . furosemide  20 mg Oral Daily  . hydrALAZINE  50 mg Oral BID  . insulin aspart  0-15 Units Subcutaneous TID WC  . Ipratropium-Albuterol  1 puff Inhalation Q6H  . levothyroxine  100 mcg Oral Q0600  . melatonin  5 mg Oral QHS  . methylPREDNISolone (SOLU-MEDROL) injection  0.5 mg/kg Intravenous Q12H   Followed by  . [START ON 05/23/2020] predniSONE  50 mg Oral Daily  . pravastatin  20 mg Oral Daily  . sodium chloride flush  3 mL Intravenous Q12H   Continuous Infusions: . azithromycin 500 mg (05/21/20 1627)  . cefTRIAXone (ROCEPHIN)  IV 2 g (05/21/20 1522)  . remdesivir 100 mg in NS 100 mL 100 mg (05/21/20 1011)   PRN Meds:.acetaminophen, guaiFENesin-dextromethorphan Medications Prior to Admission:  Prior to Admission medications   Medication Sig Start Date End Date Taking? Authorizing Provider  amiodarone (PACERONE) 100 MG tablet Take 1 tablet by mouth every morning. Patient taking differently: Take 100 mg by mouth daily. 05/04/20  Yes Allred, Jeneen Rinks, MD  amLODipine (NORVASC) 5 MG tablet Take 1 tablet by mouth daily. Patient taking differently: Take 5 mg by mouth daily. 03/31/20  Yes Burtis Junes, NP  cholecalciferol (VITAMIN D3) 25 MCG (1000 UNIT) tablet Take 2,000 Units by mouth daily.   Yes [provider]   doxycycline (VIBRAMYCIN) 100 MG capsule Take 100 mg by mouth 2 (two) times daily. 10 day supply 05/19/20  Yes [provider]  Epoetin Alfa (PROCRIT IJ) Inject 10,000 Units as directed every 28 (twenty-eight) days.    Yes [provider]  furosemide (LASIX) 20 MG tablet Take 20 mg by mouth daily.   Yes [provider]  glipiZIDE (GLUCOTROL XL) 5 MG 24 hr tablet Take 1 tablet by mouth daily. Patient taking differently: Take 5 mg by mouth daily with breakfast.  10/03/19  Yes Burtis Junes, NP  guaiFENesin-codeine 100-10 MG/5ML syrup Take 5 mLs by mouth as needed for cough. 05/10/20  Yes [provider]  hydrALAZINE (APRESOLINE) 50 MG tablet Take 1 tablet by mouth twice daily. Patient taking differently: Take 50 mg by mouth in the morning and at bedtime. 10/03/19  Yes Burtis Junes, NP  levothyroxine (SYNTHROID) 100 MCG tablet Take 1 tablet by mouth daily before breakfast. Patient taking differently: Take 100 mcg by mouth daily before breakfast. 12/02/19  Yes Burtis Junes, NP  Melatonin 5 MG CAPS Take 5 mg by mouth at bedtime.   Yes [provider]  Multiple Vitamin (MULTIVITAMIN WITH MINERALS) TABS tablet Take 1 tablet by mouth daily.   Yes [provider]  omega-3 acid ethyl esters (LOVAZA) 1 g capsule Take 2 capsules by mouth twice daily. Patient taking differently: Take 1 g by mouth daily. 09/04/19  Yes Burtis Junes, NP  potassium chloride (KLOR-CON) 10 MEQ tablet Take 1 tablet by mouth every morning. Patient taking differently: Take 10 mEq by mouth daily. 10/03/19  Yes Burtis Junes, NP  pravastatin (PRAVACHOL) 20 MG tablet Take 1 tablet by mouth at bedtime. Patient taking differently: Take 20 mg by mouth daily. 10/03/19  Yes Burtis Junes, NP  pseudoephedrine-guaifenesin (MUCINEX D) 60-600 MG 12 hr tablet Take 1 tablet by mouth every 12 (twelve) hours.   Yes [provider]  zinc gluconate 50 MG tablet Take 50 mg by  mouth daily.   Yes [provider]  ASPIRIN LOW DOSE 81 MG EC tablet Take 1 tablet by mouth daily. Patient taking differently: Take 81 mg by mouth daily. 03/31/20   Burtis Junes, NP  ELIQUIS 2.5 MG TABS tablet Take 2.5 mg by mouth 2 (two) times daily. 05/06/20   [provider]  traMADol (ULTRAM) 50 MG tablet Take 50 mg by mouth 5 (five) times daily as needed for pain. 05/19/20   [provider]   No Known Allergies Review of Systems  Unable to obtain due to mental status  Physical Exam General: Alert, awake, in no acute distress.  HEENT: No bruits, no goiter, no JVD Heart: Regular rate and rhythm. No murmur appreciated. Lungs: Fair air movement, Scattered rhonchi Abdomen: Soft, nontender, nondistended, positive bowel sounds.  Ext: No significant edema Skin: Warm and dry Neuro: Grossly intact, nonfocal.  Vital Signs: BP 101/84 (BP Location: Right Leg)   Pulse 79   Temp 98.1 F (36.7 C) (Oral)   Resp 20   SpO2 92%  Pain Scale: PAINAD   Pain Score: Asleep   SpO2: SpO2: 92 % O2 Device:SpO2: 92 % O2 Flow Rate: .O2 Flow Rate (L/min): 5 L/min  IO: Intake/output summary:   Intake/Output Summary (Last 24 hours) at 05/22/2020 0932 Last data filed at 05/21/2020 2245 Gross per 24 hour  Intake 922 ml  Output 500 ml  Net 422 ml    LBM: Last BM Date: (P) 05/21/20 Baseline Weight:   Most recent weight:       Palliative Assessment/Data:   Flowsheet Rows   Flowsheet Row Most Recent Value  Intake Tab   Referral Department Hospitalist  Unit at Time of Referral Med/Surg Unit  Palliative Care Primary Diagnosis Sepsis/Infectious Disease  Date Notified 04/21/2020  Palliative Care Type New Palliative care  Reason for referral Clarify Goals of Care  Date of Admission 04/28/2020  Date first seen by Palliative Care 05/21/20  # of days Palliative referral response time  1 Day(s)  # of days IP prior to Palliative referral 0  Clinical Assessment    Palliative Performance Scale Score 30%  Psychosocial & Spiritual Assessment   Palliative Care Outcomes       Time Total: 40 minutes Greater than 50%  of this time was spent counseling and coordinating care related to the above assessment and plan.  Signed by: Micheline Rough, MD   Please contact Palliative Medicine Team phone at [phone] for questions and concerns.  For individual provider: See Shea Evans

## 2020-05-22 NOTE — Progress Notes (Signed)
Initial Nutrition Assessment  DOCUMENTATION CODES:   Not applicable  INTERVENTION:   Glucerna Shake po TID, each supplement provides 220 kcal and 10 grams of protein  Magic cup TID with meals, each supplement provides 290 kcal and 9 grams of protein  MVI with minerals daily   NUTRITION DIAGNOSIS:   Inadequate oral intake related to poor appetite as evidenced by meal completion < 25%.    GOAL:   Patient will meet greater than or equal to 90% of their needs    MONITOR:   PO intake,Supplement acceptance,Weight trends,Labs,I & O's  REASON FOR ASSESSMENT:   Malnutrition Screening Tool    ASSESSMENT:   Pt admitted with FTT 2/2 COVID-19 infection (diagnosed 12/14) and concern for CAP. PMH includes HTN, HLD, hypothyroidism, type 2 DM, CKD stage 4.  Pt was previously admitted on 12/19 for COVID-19 infection but was discharged home so she could be with her husband who was at home with hospice services. Pt's husband has since passed. Pt's family struggling with decision to transition pt to hospice services in light of the recent death of her husband. Pt is noted to have had poor PO intake PTA and continues to have poor intake now. Meal completions charted as 15-25% x 2 recorded meals. Will order oral nutrition supplements and monitor for results of ongoing [place] discussions.   Reviewed wt history. No significant wt changes noted.   UOP: 726ml x24 hours  Labs: CBGs 195-277  Medications: lasix, ss novolog TID with meals, solu-medrol, deltasone  NUTRITION - FOCUSED PHYSICAL EXAM:  Unable to perform at this time. Will attempt at follow-up.  Diet Order:   Diet Order            DIET DYS 3 Room service appropriate? Yes; Fluid consistency: Thin  Diet effective now                 EDUCATION NEEDS:   Not appropriate for education at this time  Skin:  Skin Assessment: Reviewed RN Assessment  Last BM:  12/31  Height:   Ht Readings from Last 1 Encounters:  05/09/20 5'  2" (1.575 m)    Weight:   Wt Readings from Last 1 Encounters:  05/09/20 68.9 kg    BMI:  There is no height or weight on file to calculate BMI.  Estimated Nutritional Needs:   Kcal:  1600-1800  Protein:  80-90 grams  Fluid:  >1.6L/d    Larkin Ina, MS, RD, LDN RD pager number and weekend/on-call pager number located in [place].

## 2020-05-22 NOTE — Progress Notes (Signed)
RN spoke with pt's daughter Annamarie Dawley and informed her that she can visit with her mother since the pt falls under the criteria for the new visitation policy.

## 2020-05-22 DEATH — deceased

## 2020-05-23 DIAGNOSIS — J189 Pneumonia, unspecified organism: Secondary | ICD-10-CM | POA: Diagnosis not present

## 2020-05-23 DIAGNOSIS — Z515 Encounter for palliative care: Secondary | ICD-10-CM | POA: Diagnosis not present

## 2020-05-23 DIAGNOSIS — U071 COVID-19: Secondary | ICD-10-CM | POA: Diagnosis not present

## 2020-05-23 DIAGNOSIS — Z7189 Other specified counseling: Secondary | ICD-10-CM

## 2020-05-23 LAB — CULTURE, BLOOD (ROUTINE X 2): Special Requests: ADEQUATE

## 2020-05-23 MED ORDER — LORAZEPAM 2 MG/ML IJ SOLN
0.5000 mg | Freq: Three times a day (TID) | INTRAMUSCULAR | Status: DC
  Administered 2020-05-23 – 2020-05-25 (×9): 0.5 mg via INTRAVENOUS
  Filled 2020-05-23 (×9): qty 1

## 2020-05-23 MED ORDER — IPRATROPIUM-ALBUTEROL 20-100 MCG/ACT IN AERS: 1.0000 | INHALATION_SPRAY | Freq: Four times a day (QID) | RESPIRATORY_TRACT | Status: DC | PRN

## 2020-05-23 NOTE — Progress Notes (Signed)
Daily Progress Note   Patient Name: Leslie Coleman       Date: 05/23/2020 DOB: 1928-07-30  Age: 87 y.o. MRN#: [id number] Attending Physician: Lavina Hamman, MD Primary Care Physician: System, Provider Not In Admit Date: 05/10/2020  Reason for Consultation/Follow-up: Establishing goals of care  Subjective: I saw and examined Leslie Coleman and met with her daughter in the hallway.  We discussed plan for continuation of comfort measures moving forward.    Anticipatory guidance provided regarding expectations moving forward.  Daughter has made arrangements with funeral home and provided information to RN staff today.  Questions and concerns addressed.   PMT will continue to support holistically.  Length of Stay: 3  Current Medications: Scheduled Meds:  . amiodarone  100 mg Oral Daily  . fentaNYL  1 patch Transdermal Q72H  . LORazepam  0.5 mg Intravenous TID  . sodium chloride flush  3 mL Intravenous Q12H    PRN Meds: acetaminophen **OR** acetaminophen, antiseptic oral rinse, glycopyrrolate **OR** glycopyrrolate **OR** glycopyrrolate, guaiFENesin-dextromethorphan, haloperidol **OR** haloperidol **OR** haloperidol lactate, HYDROmorphone (DILAUDID) injection, Ipratropium-Albuterol, LORazepam **OR** LORazepam **OR** LORazepam, polyvinyl alcohol  Physical Exam         General: Sleeping comfortably HEENT: No bruits, no goiter, no JVD Lungs: Good air movement, clear Ext: No significant edema Skin: Warm and dry  Vital Signs: BP (!) 134/55 (BP Location: Right Leg)   Pulse 68   Temp 98 F (36.7 C)   Resp 20   SpO2 (!) 86%  SpO2: SpO2: (!) 86 % O2 Device: O2 Device: Room Air O2 Flow Rate: O2 Flow Rate (L/min): 5 L/min  Intake/output summary:   Intake/Output Summary (Last 24 hours)  at 05/23/2020 2232 Last data filed at 05/23/2020 0800 Gross per 24 hour  Intake 0 ml  Output 325 ml  Net -325 ml   LBM: Last BM Date: 05/23/20 Baseline Weight:   Most recent weight:      Flowsheet Rows   Flowsheet Row Most Recent Value  Intake Tab   Referral Department Hospitalist  Unit at Time of Referral Med/Surg Unit  Palliative Care Primary Diagnosis Sepsis/Infectious Disease  Date Notified 05/07/2020  Palliative Care Type New Palliative care  Reason for referral Clarify Goals of Care  Date of Admission 04/21/2020  Date first seen by Palliative Care 05/21/20  #  of days Palliative referral response time 1 Day(s)  # of days IP prior to Palliative referral 0  Clinical Assessment   Palliative Performance Scale Score 30%  Psychosocial & Spiritual Assessment   Palliative Care Outcomes       Patient Active Problem List   Diagnosis Date Noted  . Goals of care, counseling/discussion   . Palliative care by specialist   . COVID-19 05/09/2020  . Failure to thrive in adult 05/09/2020  . CAP (community acquired pneumonia) 05/09/2020  . Debility 05/09/2020  . Acute kidney injury ([place]) 10/19/2016  . Dizziness 10/19/2016  . CKD (chronic kidney disease) stage 4, GFR 15-29 ml/min (HCC) 10/19/2016  . Hypothyroidism, adult 10/19/2016  . Abnormal urinalysis 10/19/2016  . Diabetes mellitus type 2, uncontrolled ([place]) 10/19/2016  . Anemia due to chronic kidney disease 10/19/2016  . Thrombocytopenia ([place]) 10/19/2016  . Candidal dermatitis 10/19/2016  . Stress incontinence 10/19/2016  . V-tach ([place]) 10/19/2016  . Pressure injury of skin 10/19/2016  . Diabetes mellitus with complication ([place])   . Urinary tract infection without hematuria   . CKD (chronic kidney disease), stage III ([place]) 05/20/2016  . Hypothyroidism 05/20/2016  . Closed fracture of right tibial plateau 05/12/2016  . Anemia in chronic kidney disease 12/16/2015  . Dizzy 07/07/2011  . Dehydration 07/07/2011  . Type II  diabetes mellitus ([place]) 07/06/2011  . History of long-term treatment with high-risk medication 08/29/2010  . Abnormal PFTs (pulmonary function tests)   . History of ventricular tachycardia   . HTN (hypertension)   . Hyperlipidemia   . Gout     Palliative Care Assessment & Plan   Recommendations/Plan:  DNR/DNI  Leslie Coleman is actively dying.  Continue full comfort measures.  Visitation per end of life policy  Will follow up tomorrow for evaluation for symptom management.  Goals of Care and Additional Recommendations:  Limitations on Scope of Treatment: Full Comfort Care  Code Status:    Code Status Orders  (From admission, onward)         Start     Ordered   05/22/20 1231  Do not attempt resuscitation (DNR)  Continuous       Question Answer Comment  In the event of cardiac or respiratory ARREST Do not call a "code blue"   In the event of cardiac or respiratory ARREST Do not perform Intubation, CPR, defibrillation or ACLS   In the event of cardiac or respiratory ARREST Use medication by any route, position, wound care, and other measures to relive pain and suffering. May use oxygen, suction and manual treatment of airway obstruction as needed for comfort.      05/22/20 1232        Code Status History    Date Active Date Inactive Code Status Order ID Comments User Context   05/19/2020 5053 05/22/2020 1232 DNR [id number]  Harold Hedge, MD ED   05/09/2020 1422 05/10/2020 2114 Full Code [id number]  Harold Hedge, MD ED   10/19/2016 1736 10/20/2016 2030 Full Code [id number]  Samella Parr, NP Inpatient   05/08/2016 1936 05/12/2016 1902 Full Code [id number]  Eber Jones, MD Inpatient   Advance Care Planning Activity    Advance Directive Documentation   Flowsheet Row Most Recent Value  Type of Advance Directive Out of facility DNR (pink MOST or yellow form)  Pre-existing out of facility DNR order (yellow form or pink MOST form) Yellow form placed in chart (order  not valid for inpatient use)  "MOST" Form  in Place? -       Prognosis:   < 2 weeks  Discharge Planning:  Will be hospital death vs residential hospice  Care plan was discussed with daughter, Dr. Darrick Meigs  Thank you for allowing the Palliative Medicine Team to assist in the care of this patient.   Time In: 1040 Time Out: 1110 Total Time 30 Prolonged Time Billed  No   Greater than 50%  of this time was spent counseling and coordinating care related to the above assessment and plan.  Micheline Rough, MD  Please contact Palliative Medicine Team phone at [phone] for questions and concerns.

## 2020-05-23 NOTE — Progress Notes (Signed)
[facility]  PROGRESS NOTE  APREL EGELHOFF FVC:[id number] DOB: [birth year] DOA: 05/11/2020 PCP: System, Provider Not In   Brief HPI:   87 year old female who is unvaccinated against COVID-19, past medical history of hypertension, hyperlipidemia, hypothyroidism, diabetes mellitus type 2, V. tach initially treated with procainamide and now on amiodarone, stress incontinence, CKD stage IV presented with failure to thrive for several days.  She was diagnosed with COVID-19 infection on 05/04/2020 and was given multiple antibodies however she was admitted on 05/09/2020 and patient was discharged home her husband was dying at home under hospice care.  Patient has been confused, coughing with poor p.o. intake. Patient was admitted with failure to thrive, acute hypoxemic respiratory failure due to COVID-19 pneumonia.   Subjective   Agitated and moaning.  Breathing seems to be comfortable.   Assessment/Plan:     1. Acute hypoxemic respiratory failure-secondary to COVID-19 pneumonia also concern for community-acquired pneumonia.  Patient is currently on ceftriaxone, Zithromax.  She is also been started on remdesivir per pharmacy consultation.  Continue Solu-Medrol 0.5 mg/kg every 12 hours.  Incentive spirometry, flutter valve.  Patient CRP is down to 15.0.  Patient was also started on baricitinib.   After discussion with family, patient has been made full comfort care.  Covid treatment has been discontinued.  Will discontinue labs for tomorrow morning. 2. Failure to thrive-secondary to 1, speech therapy evaluation done today.  Patient started on dysphagia 3 thin liquid diet.  Encourage p.o. intake. 3. CKD stage IV-creatinine has been slowly trending upwards over the past 1 year, likely at baseline. 4. Elevated troponin-mild elevation of troponin, likely from demand ischemia. 5. Diabetes mellitus type 2-continue sliding scale insulin NovoLog. 6. History of VT-continue  amiodarone 7. Hypertension-continue amlodipine 8. Goals of care-palliative care discussed with patient's family, patient is made full comfort care.  Will stop treatment for COVID-19 pneumonia.  Patient to go to residential hospice,  when bed available.     COVID-19 Labs  Recent Labs    05/21/20 0405 05/22/20 0427  DDIMER 3.26* 3.02*  FERRITIN 434* 533*  CRP 22.9* 15.7*    Lab Results  Component Value Date   SARSCOV2NAA POSITIVE (A) 05/04/2020   [place] NEGATIVE 04/29/2020     Scheduled medications:   . amiodarone  100 mg Oral Daily  . fentaNYL  1 patch Transdermal Q72H  . LORazepam  0.5 mg Intravenous TID  . sodium chloride flush  3 mL Intravenous Q12H         CBG: Recent Labs  Lab 05/21/20 1158 05/21/20 1633 05/21/20 2248 05/22/20 0806 05/22/20 1158  GLUCAP 231* 214* 195* 277* 374*    SpO2: 91 % O2 Flow Rate (L/min): 5 L/min    CBC: Recent Labs  Lab 04/26/2020 1317 05/21/20 0405 05/22/20 0427  WBC 17.0* 10.7* 11.9*  NEUTROABS 15.7* 10.0* 11.2*  HGB 9.0* 8.8* 8.8*  HCT 27.4* 26.8* 26.6*  MCV 84.3 84.5 84.4  PLT 112* 97* 127*    Basic Metabolic Panel: Recent Labs  Lab 05/15/2020 1317 05/21/20 0405 05/22/20 0427  NA 135 136 138  K 4.3 4.4 4.3  CL 111 110 112*  CO2 14* 14* 13*  GLUCOSE 273* 340* 280*  BUN 44* 50* 60*  CREATININE 2.67* 2.59* 2.93*  CALCIUM 8.0* 7.9* 8.0*     Liver Function Tests: Recent Labs  Lab 05/19/2020 1317 05/21/20 0405 05/22/20 0427  AST 35 30 41  ALT 29 29 36  ALKPHOS 181* 164* 178*  BILITOT 0.9 0.6 0.5  PROT 6.1* 5.8* 5.8*  ALBUMIN 2.5* 2.3* 2.3*     Antibiotics: Anti-infectives (From admission, onward)   Start     Dose/Rate Route Frequency Ordered Stop   05/21/20 1600  cefTRIAXone (ROCEPHIN) 2 g in sodium chloride 0.9 % 100 mL IVPB  Status:  Discontinued        2 g 200 mL/hr over 30 Minutes Intravenous Every 24 hours 05/14/2020 1744 05/22/20 1229   05/21/20 1600  azithromycin (ZITHROMAX) 500 mg  in sodium chloride 0.9 % 250 mL IVPB  Status:  Discontinued        500 mg 250 mL/hr over 60 Minutes Intravenous Every 24 hours 05/15/2020 1744 05/22/20 1229   05/21/20 1000  remdesivir 100 mg in sodium chloride 0.9 % 100 mL IVPB  Status:  Discontinued       "Followed by" Linked Group Details   100 mg 200 mL/hr over 30 Minutes Intravenous Daily 05/04/2020 1743 05/22/20 1229   04/22/2020 1930  remdesivir 200 mg in sodium chloride 0.9% 250 mL IVPB       "Followed by" Linked Group Details   200 mg 580 mL/hr over 30 Minutes Intravenous Once 04/22/2020 1743 05/08/2020 2046   04/22/2020 1515  cefTRIAXone (ROCEPHIN) 1 g in sodium chloride 0.9 % 100 mL IVPB        1 g 200 mL/hr over 30 Minutes Intravenous  Once 05/06/2020 1508 05/07/2020 1626   05/14/2020 1515  azithromycin (ZITHROMAX) 500 mg in sodium chloride 0.9 % 250 mL IVPB        500 mg 250 mL/hr over 60 Minutes Intravenous  Once 05/12/2020 1508 05/12/2020 1707       DVT prophylaxis: Apixaban  Code Status: DNR/DNI, comfort care  Family Communication: No family at bedside   Consultants:    Procedures:      Objective   Vitals:   05/22/20 0300 05/22/20 0500 05/22/20 0632 05/22/20 2057  BP:   101/84 (!) 125/50  Pulse: 84 73 79 70  Resp: (!) 24 18 20 16   Temp:   98.1 F (36.7 C) 97.7 F (36.5 C)  TempSrc:   Oral Oral  SpO2: 90%  92% 91%    Intake/Output Summary (Last 24 hours) at 05/23/2020 1848 Last data filed at 05/23/2020 0800 Gross per 24 hour  Intake 0 ml  Output 325 ml  Net -325 ml    12/31 1901 - 01/02 0700 In: 120 [P.O.:120] Out: 675 [Urine:675]  There were no vitals filed for this visit.  Physical Examination:   General-appears in no acute distress Heart-S1-S2, regular, no murmur auscultated Lungs-clear to auscultation bilaterally, no wheezing or crackles auscultated Abdomen-soft, nontender, no organomegaly Extremities-no edema in the lower extremities Neuro-alert, oriented x3, no focal deficit noted  Status is:  Inpatient  Dispo: The patient is from: Home              Anticipated d/c is to: Home              Anticipated d/c date is: 05/25/2019              Patient currently not stable for discharge  Barrier to discharge-residential hospice placement  Pressure Injury 10/19/16 Stage I -  Intact skin with non-blanchable redness of a localized area usually over a bony prominence. (Active)  10/19/16 1741  Location: Buttocks  Location Orientation: Medial  Staging: Stage I -  Intact skin with non-blanchable redness of a localized area usually over a bony prominence.  Wound Description (Comments):  Present on Admission: Yes           Data Reviewed:   Recent Results (from the past 240 hour(s))  Culture, blood (routine x 2)     Status: Abnormal   Collection Time: 04/22/2020  1:47 PM   Specimen: BLOOD LEFT FOREARM  Result Value Ref Range Status   Specimen Description   Final    BLOOD LEFT FOREARM Performed at [facility] [address] [place], [place] [address]    Special Requests   Final    BOTTLES DRAWN AEROBIC AND ANAEROBIC Blood Culture adequate volume Performed at [facility] [address], [place], [place] [address]    Culture  Setup Time   Final    GRAM POSITIVE COCCI IN CLUSTERS IN BOTH AEROBIC AND ANAEROBIC BOTTLES Organism ID to follow CRITICAL RESULT CALLED TO, READ BACK BY AND VERIFIED WITH: Guadlupe Spanish PharmD 15:05 05/21/20 ([email or web address])    Culture (A)  Final    STAPHYLOCOCCUS EPIDERMIDIS THE SIGNIFICANCE OF ISOLATING THIS ORGANISM FROM A SINGLE SET OF BLOOD CULTURES WHEN MULTIPLE SETS ARE DRAWN IS UNCERTAIN. PLEASE NOTIFY THE MICROBIOLOGY DEPARTMENT WITHIN ONE WEEK IF SPECIATION AND SENSITIVITIES ARE REQUIRED. Performed at [facility], [place] [address], [place], [place] [address]    Report Status 05/23/2020 FINAL  Final  Culture, blood (routine x 2)     Status: None (Preliminary result)   Collection Time: 05/01/2020  1:47 PM    Specimen: BLOOD  Result Value Ref Range Status   Specimen Description   Final    BLOOD RIGHT ANTECUBITAL Performed at [facility] [address], [place], [place] [address]    Special Requests   Final    BOTTLES DRAWN AEROBIC AND ANAEROBIC Blood Culture adequate volume Performed at [facility] [address] [place], [place] [address]    Culture   Final    NO GROWTH 3 DAYS Performed at [facility], [place] [address], [place], [place] [address]    Report Status PENDING  Incomplete  Blood Culture ID Panel (Reflexed)     Status: Abnormal   Collection Time: 05/12/2020  1:47 PM  Result Value Ref Range Status   Enterococcus faecalis NOT DETECTED NOT DETECTED Final   Enterococcus Faecium NOT DETECTED NOT DETECTED Final   Listeria monocytogenes NOT DETECTED NOT DETECTED Final   Staphylococcus species DETECTED (A) NOT DETECTED Final    Comment: CRITICAL RESULT CALLED TO, READ BACK BY AND VERIFIED WITH: Guadlupe Spanish PharmD 15:05 05/21/20 ([email or web address])    Staphylococcus aureus (BCID) NOT DETECTED NOT DETECTED Final   Staphylococcus epidermidis DETECTED (A) NOT DETECTED Final    Comment: Methicillin (oxacillin) resistant coagulase negative staphylococcus. Possible blood culture contaminant (unless isolated from more than one blood culture draw or clinical case suggests pathogenicity). No antibiotic treatment is indicated for blood  culture contaminants. CRITICAL RESULT CALLED TO, READ BACK BY AND VERIFIED WITH: Guadlupe Spanish PharmD 15:05 05/21/20 ([email or web address])    Staphylococcus lugdunensis NOT DETECTED NOT DETECTED Final   Streptococcus species NOT DETECTED NOT DETECTED Final   Streptococcus agalactiae NOT DETECTED NOT DETECTED Final   Streptococcus pneumoniae NOT DETECTED NOT DETECTED Final   Streptococcus pyogenes NOT DETECTED NOT DETECTED Final   A.calcoaceticus-baumannii NOT DETECTED NOT DETECTED Final   Bacteroides fragilis NOT DETECTED NOT DETECTED  Final   Enterobacterales NOT DETECTED NOT DETECTED Final   Enterobacter cloacae complex NOT DETECTED NOT DETECTED Final   Escherichia coli NOT DETECTED NOT  DETECTED Final   Klebsiella aerogenes NOT DETECTED NOT DETECTED Final   Klebsiella oxytoca NOT DETECTED NOT DETECTED Final   Klebsiella pneumoniae NOT DETECTED NOT DETECTED Final   Proteus species NOT DETECTED NOT DETECTED Final   Salmonella species NOT DETECTED NOT DETECTED Final   Serratia marcescens NOT DETECTED NOT DETECTED Final   Haemophilus influenzae NOT DETECTED NOT DETECTED Final   Neisseria meningitidis NOT DETECTED NOT DETECTED Final   Pseudomonas aeruginosa NOT DETECTED NOT DETECTED Final   Stenotrophomonas maltophilia NOT DETECTED NOT DETECTED Final   Candida albicans NOT DETECTED NOT DETECTED Final   Candida auris NOT DETECTED NOT DETECTED Final   Candida glabrata NOT DETECTED NOT DETECTED Final   Candida krusei NOT DETECTED NOT DETECTED Final   Candida parapsilosis NOT DETECTED NOT DETECTED Final   Candida tropicalis NOT DETECTED NOT DETECTED Final   Cryptococcus neoformans/gattii NOT DETECTED NOT DETECTED Final   Methicillin resistance mecA/C DETECTED (A) NOT DETECTED Final    Comment: CRITICAL RESULT CALLED TO, READ BACK BY AND VERIFIED WITH: Guadlupe Spanish PharmD 15:05 05/21/20 ([email or web address]) Performed at [facility], [address], [place], [place] [address]     No results for input(s): LIPASE, AMYLASE in the last 168 hours. No results for input(s): AMMONIA in the last 168 hours.  Cardiac Enzymes: No results for input(s): CKTOTAL, CKMB, CKMBINDEX, TROPONINI in the last 168 hours. BNP (last 3 results) Recent Labs    05/11/2020 1317  BNP 300.7*    ProBNP (last 3 results) No results for input(s): PROBNP in the last 8760 hours.  Studies:  No results found.     Berle Mull   05/23/2020, 6:48 PM  LOS: 3 days

## 2020-05-24 DIAGNOSIS — N184 Chronic kidney disease, stage 4 (severe): Secondary | ICD-10-CM | POA: Diagnosis not present

## 2020-05-24 DIAGNOSIS — Z7189 Other specified counseling: Secondary | ICD-10-CM | POA: Diagnosis not present

## 2020-05-24 DIAGNOSIS — U071 COVID-19: Secondary | ICD-10-CM | POA: Diagnosis not present

## 2020-05-24 DIAGNOSIS — J189 Pneumonia, unspecified organism: Secondary | ICD-10-CM | POA: Diagnosis not present

## 2020-05-24 NOTE — Progress Notes (Signed)
[facility]  PROGRESS NOTE  Leslie Coleman VPX:[id number] DOB: [birth year] DOA: 05/17/2020 PCP: System, Provider Not In   Brief HPI:   87 year old female who is unvaccinated against COVID-19, past medical history of hypertension, hyperlipidemia, hypothyroidism, diabetes mellitus type 2, V. tach initially treated with procainamide and now on amiodarone, stress incontinence, CKD stage IV presented with failure to thrive for several days.  She was diagnosed with COVID-19 infection on 05/04/2020 and was given multiple antibodies however she was admitted on 05/09/2020 and patient was discharged home her husband was dying at home under hospice care.  Patient has been confused, coughing with poor p.o. intake. Patient was admitted with failure to thrive, acute hypoxemic respiratory failure due to COVID-19 pneumonia.   Subjective   Appears comfortable.  No nausea no vomiting.  No fever no chills.   Assessment/Plan:     1. Acute hypoxemic respiratory failure-secondary to COVID-19 pneumonia also concern for community-acquired pneumonia.  Patient was treated with IV steroids as well as IV antibiotics including IV remdesivir.  Condition continues to worsen.  Family decided transition to comfort care. 2. Failure to thrive-secondary to 1, currently comfort care 3. CKD stage IV-creatinine has been slowly trending upwards over the past 1 year, likely at baseline. 4. Elevated troponin-mild elevation of troponin, likely from demand ischemia. 5. Diabetes mellitus type 2-continue sliding scale insulin NovoLog. 6. History of VT-continue amiodarone 7. Hypertension-continue amlodipine 8. Goals of care-palliative care discussed with patient's family, patient is made full comfort care.  Currently actively dying.  Not stable for transfer.     COVID-19 Labs  Recent Labs    05/22/20 0427  DDIMER 3.02*  FERRITIN 533*  CRP 15.7*    Lab Results  Component Value Date   SARSCOV2NAA POSITIVE (A)  05/04/2020   [place] NEGATIVE 04/29/2020     Scheduled medications:   . fentaNYL  1 patch Transdermal Q72H  . LORazepam  0.5 mg Intravenous TID  . sodium chloride flush  3 mL Intravenous Q12H         CBG: Recent Labs  Lab 05/21/20 1158 05/21/20 1633 05/21/20 2248 05/22/20 0806 05/22/20 1158  GLUCAP 231* 214* 195* 277* 374*    SpO2: (!) 86 % O2 Flow Rate (L/min): 5 L/min    CBC: Recent Labs  Lab 05/14/2020 1317 05/21/20 0405 05/22/20 0427  WBC 17.0* 10.7* 11.9*  NEUTROABS 15.7* 10.0* 11.2*  HGB 9.0* 8.8* 8.8*  HCT 27.4* 26.8* 26.6*  MCV 84.3 84.5 84.4  PLT 112* 97* 127*    Basic Metabolic Panel: Recent Labs  Lab 05/15/2020 1317 05/21/20 0405 05/22/20 0427  NA 135 136 138  K 4.3 4.4 4.3  CL 111 110 112*  CO2 14* 14* 13*  GLUCOSE 273* 340* 280*  BUN 44* 50* 60*  CREATININE 2.67* 2.59* 2.93*  CALCIUM 8.0* 7.9* 8.0*     Liver Function Tests: Recent Labs  Lab 05/15/2020 1317 05/21/20 0405 05/22/20 0427  AST 35 30 41  ALT 29 29 36  ALKPHOS 181* 164* 178*  BILITOT 0.9 0.6 0.5  PROT 6.1* 5.8* 5.8*  ALBUMIN 2.5* 2.3* 2.3*     Antibiotics: Anti-infectives (From admission, onward)   Start     Dose/Rate Route Frequency Ordered Stop   05/21/20 1600  cefTRIAXone (ROCEPHIN) 2 g in sodium chloride 0.9 % 100 mL IVPB  Status:  Discontinued        2 g 200 mL/hr over 30 Minutes Intravenous Every 24 hours 05/11/2020 1744 05/22/20 1229   05/21/20  1600  azithromycin (ZITHROMAX) 500 mg in sodium chloride 0.9 % 250 mL IVPB  Status:  Discontinued        500 mg 250 mL/hr over 60 Minutes Intravenous Every 24 hours 04/26/2020 1744 05/22/20 1229   05/21/20 1000  remdesivir 100 mg in sodium chloride 0.9 % 100 mL IVPB  Status:  Discontinued       "Followed by" Linked Group Details   100 mg 200 mL/hr over 30 Minutes Intravenous Daily 05/07/2020 1743 05/22/20 1229   05/18/2020 1930  remdesivir 200 mg in sodium chloride 0.9% 250 mL IVPB       "Followed by" Linked Group  Details   200 mg 580 mL/hr over 30 Minutes Intravenous Once 05/16/2020 1743 05/12/2020 2046   05/03/2020 1515  cefTRIAXone (ROCEPHIN) 1 g in sodium chloride 0.9 % 100 mL IVPB        1 g 200 mL/hr over 30 Minutes Intravenous  Once 04/23/2020 1508 04/24/2020 1626   04/29/2020 1515  azithromycin (ZITHROMAX) 500 mg in sodium chloride 0.9 % 250 mL IVPB        500 mg 250 mL/hr over 60 Minutes Intravenous  Once 05/18/2020 1508 04/22/2020 1707       DVT prophylaxis: Apixaban  Code Status: DNR/DNI, comfort care  Family Communication: No family at bedside   Consultants:    Procedures:      Objective   Vitals:   05/22/20 0500 05/22/20 0632 05/22/20 2057 05/23/20 2106  BP:  101/84 (!) 125/50 (!) 134/55  Pulse: 73 79 70 68  Resp: 18 20 16 20   Temp:  98.1 F (36.7 C) 97.7 F (36.5 C) 98 F (36.7 C)  TempSrc:  Oral Oral   SpO2:  92% 91% (!) 86%   No intake or output data in the 24 hours ending 05/24/20 2150  No intake/output data recorded.  There were no vitals filed for this visit.  Physical Examination:   General: Appear in mild distress, no Rash;  Cardiovascular: S1 and S2 Present, no Murmur, Respiratory: increased respiratory effort, Bilateral Air entry present and bilateral  Crackles, no wheezes Abdomen: Bowel Sound present  Status is: Inpatient  Dispo: The patient is from: Home              Anticipated d/c is to: Home              Anticipated d/c date is: 05/26/2019              Patient currently not stable for discharge  Barrier to discharge-residential hospice placement  Pressure Injury 10/19/16 Stage I -  Intact skin with non-blanchable redness of a localized area usually over a bony prominence. (Active)  10/19/16 1741  Location: Buttocks  Location Orientation: Medial  Staging: Stage I -  Intact skin with non-blanchable redness of a localized area usually over a bony prominence.  Wound Description (Comments):   Present on Admission: Yes           Data  Reviewed:   Recent Results (from the past 240 hour(s))  Culture, blood (routine x 2)     Status: Abnormal   Collection Time: 05/18/2020  1:47 PM   Specimen: BLOOD LEFT FOREARM  Result Value Ref Range Status   Specimen Description   Final    BLOOD LEFT FOREARM Performed at [facility] [address] [place], [place] [address]    Special Requests   Final    BOTTLES DRAWN AEROBIC AND ANAEROBIC Blood Culture adequate  volume Performed at [facility], [place] [address] [place], [place] [address]    Culture  Setup Time   Final    GRAM POSITIVE COCCI IN CLUSTERS IN BOTH AEROBIC AND ANAEROBIC BOTTLES Organism ID to follow CRITICAL RESULT CALLED TO, READ BACK BY AND VERIFIED WITH: Guadlupe Spanish PharmD 15:05 05/21/20 ([email or web address])    Culture (A)  Final    STAPHYLOCOCCUS EPIDERMIDIS THE SIGNIFICANCE OF ISOLATING THIS ORGANISM FROM A SINGLE SET OF BLOOD CULTURES WHEN MULTIPLE SETS ARE DRAWN IS UNCERTAIN. PLEASE NOTIFY THE MICROBIOLOGY DEPARTMENT WITHIN ONE WEEK IF SPECIATION AND SENSITIVITIES ARE REQUIRED. Performed at [facility], [place] [address] [place], [place] [address]    Report Status 05/23/2020 FINAL  Final  Culture, blood (routine x 2)     Status: None (Preliminary result)   Collection Time: 04/30/2020  1:47 PM   Specimen: BLOOD  Result Value Ref Range Status   Specimen Description   Final    BLOOD RIGHT ANTECUBITAL Performed at [facility] [address] [place], [place] [address]    Special Requests   Final    BOTTLES DRAWN AEROBIC AND ANAEROBIC Blood Culture adequate volume Performed at [facility] [address] [place], [place] [address]    Culture   Final    NO GROWTH 4 DAYS Performed at [facility], [place] [address], [place],  [address]    Report Status PENDING  Incomplete  Blood Culture ID Panel (Reflexed)     Status: Abnormal   Collection Time: 04/24/2020  1:47 PM  Result Value  Ref Range Status   Enterococcus faecalis NOT DETECTED NOT DETECTED Final   Enterococcus Faecium NOT DETECTED NOT DETECTED Final   Listeria monocytogenes NOT DETECTED NOT DETECTED Final   Staphylococcus species DETECTED (A) NOT DETECTED Final    Comment: CRITICAL RESULT CALLED TO, READ BACK BY AND VERIFIED WITH: Guadlupe Spanish PharmD 15:05 05/21/20 ([email or web address])    Staphylococcus aureus (BCID) NOT DETECTED NOT DETECTED Final   Staphylococcus epidermidis DETECTED (A) NOT DETECTED Final    Comment: Methicillin (oxacillin) resistant coagulase negative staphylococcus. Possible blood culture contaminant (unless isolated from more than one blood culture draw or clinical case suggests pathogenicity). No antibiotic treatment is indicated for blood  culture contaminants. CRITICAL RESULT CALLED TO, READ BACK BY AND VERIFIED WITH: Guadlupe Spanish PharmD 15:05 05/21/20 ([email or web address])    Staphylococcus lugdunensis NOT DETECTED NOT DETECTED Final   Streptococcus species NOT DETECTED NOT DETECTED Final   Streptococcus agalactiae NOT DETECTED NOT DETECTED Final   Streptococcus pneumoniae NOT DETECTED NOT DETECTED Final   Streptococcus pyogenes NOT DETECTED NOT DETECTED Final   A.calcoaceticus-baumannii NOT DETECTED NOT DETECTED Final   Bacteroides fragilis NOT DETECTED NOT DETECTED Final   Enterobacterales NOT DETECTED NOT DETECTED Final   Enterobacter cloacae complex NOT DETECTED NOT DETECTED Final   Escherichia coli NOT DETECTED NOT DETECTED Final   Klebsiella aerogenes NOT DETECTED NOT DETECTED Final   Klebsiella oxytoca NOT DETECTED NOT DETECTED Final   Klebsiella pneumoniae NOT DETECTED NOT DETECTED Final   Proteus species NOT DETECTED NOT DETECTED Final   Salmonella species NOT DETECTED NOT DETECTED Final   Serratia marcescens NOT DETECTED NOT DETECTED Final   Haemophilus influenzae NOT DETECTED NOT DETECTED Final   Neisseria meningitidis NOT DETECTED NOT DETECTED Final   Pseudomonas aeruginosa NOT DETECTED  NOT DETECTED Final   Stenotrophomonas maltophilia NOT DETECTED NOT DETECTED Final   Candida albicans NOT DETECTED NOT DETECTED Final  Candida auris NOT DETECTED NOT DETECTED Final   Candida glabrata NOT DETECTED NOT DETECTED Final   Candida krusei NOT DETECTED NOT DETECTED Final   Candida parapsilosis NOT DETECTED NOT DETECTED Final   Candida tropicalis NOT DETECTED NOT DETECTED Final   Cryptococcus neoformans/gattii NOT DETECTED NOT DETECTED Final   Methicillin resistance mecA/C DETECTED (A) NOT DETECTED Final    Comment: CRITICAL RESULT CALLED TO, READ BACK BY AND VERIFIED WITH: Guadlupe Spanish PharmD 15:05 05/21/20 (wilsonm) Performed at [facility], [address], [place], [place] [address]     No results for input(s): LIPASE, AMYLASE in the last 168 hours. No results for input(s): AMMONIA in the last 168 hours.  Cardiac Enzymes: No results for input(s): CKTOTAL, CKMB, CKMBINDEX, TROPONINI in the last 168 hours. BNP (last 3 results) Recent Labs    04/22/2020 1317  BNP 300.7*    ProBNP (last 3 results) No results for input(s): PROBNP in the last 8760 hours.  Studies:  No results found.     Berle Mull   05/24/2020, 9:50 PM  LOS: 4 days

## 2020-05-24 NOTE — Care Management Important Message (Signed)
Important Message  Patient Details IM Letter given to the Patient Name: Leslie Coleman MRN: [id number] Date of Birth: 1928/12/01   Medicare Important Message Given:  Yes     Kerin Salen 05/24/2020, 12:09 PM

## 2020-05-24 NOTE — Progress Notes (Addendum)
Manufacturing engineer [facility])  Hospital liaison spoke with [name], daughter, to touch base with her regarding her wishes for her mother's care and to share my condolences on the recent loss of her father.  [name] verbalized understanding of her mother's very grave condition, shared that she had visited this morning and saw that her mother was resting comfortably with no distress. Cindy verbalized appreciation that her mother is at [place] getting round the clock care, did not share interest in moving her mother at this stage.  Information about bereavement services shared and active listening provided.   ACC information and contact numbers given to [facility].  Please call with any questions or concerns.  Thank you for the opportunity to participate in this pt's care.  Domenic Moras, BSN, RN [employer] [phone] (24h on call)

## 2020-05-25 DIAGNOSIS — R5381 Other malaise: Secondary | ICD-10-CM | POA: Diagnosis not present

## 2020-05-25 DIAGNOSIS — U071 COVID-19: Secondary | ICD-10-CM | POA: Diagnosis not present

## 2020-05-25 DIAGNOSIS — Z7189 Other specified counseling: Secondary | ICD-10-CM | POA: Diagnosis not present

## 2020-05-25 DIAGNOSIS — N184 Chronic kidney disease, stage 4 (severe): Secondary | ICD-10-CM | POA: Diagnosis not present

## 2020-05-25 DIAGNOSIS — J189 Pneumonia, unspecified organism: Secondary | ICD-10-CM | POA: Diagnosis not present

## 2020-05-25 DIAGNOSIS — Z515 Encounter for palliative care: Secondary | ICD-10-CM | POA: Diagnosis not present

## 2020-05-25 LAB — CULTURE, BLOOD (ROUTINE X 2)
Culture: NO GROWTH
Special Requests: ADEQUATE

## 2020-05-25 MED ORDER — HYDROMORPHONE HCL PF 10 MG/ML IJ SOLN
0.5000 mg/h | INTRAMUSCULAR | Status: DC
  Administered 2020-05-25: 0.3 mg/h via INTRAVENOUS
  Filled 2020-05-25: qty 2.5

## 2020-05-25 MED ORDER — HYDROMORPHONE HCL 1 MG/ML IJ SOLN: 0.5000 mg | Freq: Three times a day (TID) | INTRAMUSCULAR | Status: DC

## 2020-05-25 NOTE — Progress Notes (Signed)
[facility] (ACC)  Spoke to MD and daughter about plan of care. Patient started on a Dilaudid drip for pain control and comfort. Patient is not stable for discharge. Family aware and understanding.   Please feel free to call for any support or questions.  Clementeen Hoof, BSN, [employer] [phone]

## 2020-05-25 NOTE — Progress Notes (Signed)
Daily Progress Note   Patient Name: Leslie Coleman       Date: 05/25/2020 DOB: 1929/03/02  Age: 87 y.o. MRN#: [id number] Attending Physician: Lavina Hamman, MD Primary Care Physician: System, Provider Not In Admit Date: 04/25/2020  Reason for Consultation/Follow-up: Establishing goals of care  Subjective: Appears comfortable, appears to be in the active dying process, does not appear to have nonverbal gestures of distress or discomfort, I have discussed with bedside RN as well as call placed and discussed with daughter.  Questions and concerns addressed.   PMT will continue to support holistically.  Length of Stay: 5  Current Medications: Scheduled Meds:  . fentaNYL  1 patch Transdermal Q72H  . LORazepam  0.5 mg Intravenous TID  . sodium chloride flush  3 mL Intravenous Q12H    PRN Meds: acetaminophen **OR** acetaminophen, antiseptic oral rinse, glycopyrrolate **OR** glycopyrrolate **OR** glycopyrrolate, guaiFENesin-dextromethorphan, haloperidol **OR** haloperidol **OR** haloperidol lactate, HYDROmorphone (DILAUDID) injection, Ipratropium-Albuterol, LORazepam **OR** LORazepam **OR** LORazepam, polyvinyl alcohol  Physical Exam         General: Sleeping comfortably HEENT: No bruits, no goiter, no JVD Lungs: fair air movement, clear, some periods apnea noted Ext: No significant edema Skin: Warm and dry  Vital Signs: BP (!) 134/55 (BP Location: Right Leg)   Pulse 68   Temp 98 F (36.7 C)   Resp 20   Ht 5\' 2"  (1.575 m)   Wt 65.2 kg   SpO2 (!) 86%   BMI 26.29 kg/m  SpO2: SpO2: (!) 86 % O2 Device: O2 Device: Room Air O2 Flow Rate: O2 Flow Rate (L/min): 5 L/min  Intake/output summary:   Intake/Output Summary (Last 24 hours) at 05/25/2020 1602 Last data filed at 05/25/2020  1500 Gross per 24 hour  Intake 3.81 ml  Output 350 ml  Net -346.19 ml   LBM: Last BM Date: 05/23/20 Baseline Weight: Weight: 65.2 kg Most recent weight: Weight: 65.2 kg    Flowsheet Rows   Flowsheet Row Most Recent Value  Intake Tab   Referral Department Hospitalist  Unit at Time of Referral Med/Surg Unit  Palliative Care Primary Diagnosis Sepsis/Infectious Disease  Date Notified 05/05/2020  Palliative Care Type New Palliative care  Reason for referral Clarify Goals of Care  Date of Admission 04/22/2020  Date first seen by Palliative Care 05/21/20  #  of days Palliative referral response time 1 Day(s)  # of days IP prior to Palliative referral 0  Clinical Assessment   Palliative Performance Scale Score 30%  Psychosocial & Spiritual Assessment   Palliative Care Outcomes       Patient Active Problem List   Diagnosis Date Noted  . Goals of care, counseling/discussion   . Palliative care by specialist   . COVID-19 05/09/2020  . Failure to thrive in adult 05/09/2020  . CAP (community acquired pneumonia) 05/09/2020  . Debility 05/09/2020  . Acute kidney injury ([place]) 10/19/2016  . Dizziness 10/19/2016  . CKD (chronic kidney disease) stage 4, GFR 15-29 ml/min (HCC) 10/19/2016  . Hypothyroidism, adult 10/19/2016  . Abnormal urinalysis 10/19/2016  . Diabetes mellitus type 2, uncontrolled ([place]) 10/19/2016  . Anemia due to chronic kidney disease 10/19/2016  . Thrombocytopenia ([place]) 10/19/2016  . Candidal dermatitis 10/19/2016  . Stress incontinence 10/19/2016  . V-tach ([place]) 10/19/2016  . Pressure injury of skin 10/19/2016  . Diabetes mellitus with complication ([place])   . Urinary tract infection without hematuria   . CKD (chronic kidney disease), stage III ([place]) 05/20/2016  . Hypothyroidism 05/20/2016  . Closed fracture of right tibial plateau 05/12/2016  . Anemia in chronic kidney disease 12/16/2015  . Dizzy 07/07/2011  . Dehydration 07/07/2011  . Type II diabetes mellitus  ([place]) 07/06/2011  . History of long-term treatment with high-risk medication 08/29/2010  . Abnormal PFTs (pulmonary function tests)   . History of ventricular tachycardia   . HTN (hypertension)   . Hyperlipidemia   . Gout     Palliative Care Assessment & Plan   Recommendations/Plan:  DNR/DNI  Ms. Willow is actively dying.  Continue full comfort measures.  I anticipate this will be a hospital death.  Continue Dilaudid drip.  Prognosis in my opinion appears to be as short as hours to as long as some very limited number of days 1-2 days.  This was discussed frankly and compassionately with daughter [name] over the phone.  Appreciate bedside RN assistance.  Continue comfort measures.  Visitation per end of life policy, daughter wishes to come by this evening after she gets off from work.  Palliative remains available for any symptom management needs.  Please call [phone] if further palliative care input is required.  Goals of Care and Additional Recommendations:  Limitations on Scope of Treatment: Full Comfort Care  Code Status:    Code Status Orders  (From admission, onward)         Start     Ordered   05/22/20 1231  Do not attempt resuscitation (DNR)  Continuous       Question Answer Comment  In the event of cardiac or respiratory ARREST Do not call a "code blue"   In the event of cardiac or respiratory ARREST Do not perform Intubation, CPR, defibrillation or ACLS   In the event of cardiac or respiratory ARREST Use medication by any route, position, wound care, and other measures to relive pain and suffering. May use oxygen, suction and manual treatment of airway obstruction as needed for comfort.      05/22/20 1232        Code Status History    Date Active Date Inactive Code Status Order ID Comments User Context   05/04/2020 1744 05/22/2020 1232 DNR [id number]  Harold Hedge, MD ED   05/09/2020 1422 05/10/2020 2114 Full Code [id number]  Harold Hedge, MD ED    10/19/2016 1736 10/20/2016 2030 Full Code  [id number]  Samella Parr, NP Inpatient   05/08/2016 1936 05/12/2016 1902 Full Code [id number]  Eber Jones, MD Inpatient   Advance Care Planning Activity    Advance Directive Documentation   Flowsheet Row Most Recent Value  Type of Advance Directive Out of facility DNR (pink MOST or yellow form)  Pre-existing out of facility DNR order (yellow form or pink MOST form) Yellow form placed in chart (order not valid for inpatient use)  "MOST" Form in Place? --       Prognosis:   Hours - Days  Discharge Planning:  Anticipate hospital death.  Care plan was discussed with daughter, RN  Thank you for allowing the Palliative Medicine Team to assist in the care of this patient.   Time In: 1300 Time Out: 1325 Total Time 25 Prolonged Time Billed  No   Greater than 50%  of this time was spent counseling and coordinating care related to the above assessment and plan.  Loistine Chance, MD  Please contact Palliative Medicine Team phone at [phone] for questions and concerns.

## 2020-05-25 NOTE — Progress Notes (Signed)
Daily Progress Note   Patient Name: Leslie Coleman       Date: 05/25/2020 DOB: 10/03/1928  Age: 87 y.o. MRN#: [id number] Attending Physician: Lavina Hamman, MD Primary Care Physician: System, Provider Not In Admit Date: 05/04/2020  Reason for Consultation/Follow-up: Establishing goals of care  Subjective: I saw and examined Leslie Coleman.  She was lying in bed in no distress.  I did not try to wake her.  I received a page that her daughter wished to have an update on her situation.  I called and was able to reach patient's daughter, Leslie Coleman.  We discussed that Leslie Coleman appears to be comfortable with current interventions.  Discussed plan for continued comfort care moving forward.  Anticipatory guidance provided regarding expectations moving forward.  Daughter has made arrangements with funeral home and provided information to staff.  Questions and concerns addressed.   PMT will continue to support holistically.  Length of Stay: 5  Current Medications: Scheduled Meds:  . fentaNYL  1 patch Transdermal Q72H  . LORazepam  0.5 mg Intravenous TID  . sodium chloride flush  3 mL Intravenous Q12H    PRN Meds: acetaminophen **OR** acetaminophen, antiseptic oral rinse, glycopyrrolate **OR** glycopyrrolate **OR** glycopyrrolate, guaiFENesin-dextromethorphan, haloperidol **OR** haloperidol **OR** haloperidol lactate, HYDROmorphone (DILAUDID) injection, Ipratropium-Albuterol, LORazepam **OR** LORazepam **OR** LORazepam, polyvinyl alcohol  Physical Exam         General: Sleeping comfortably HEENT: No bruits, no goiter, no JVD Lungs: fair air movement, clear, some periods apnea noted Ext: No significant edema Skin: Warm and dry  Vital Signs: BP (!) 134/55 (BP Location: Right Leg)   Pulse 68    Temp 98 F (36.7 C)   Resp 20   Ht 5\' 2"  (1.575 m)   Wt 65.2 kg   SpO2 (!) 86%   BMI 26.29 kg/m  SpO2: SpO2: (!) 86 % O2 Device: O2 Device: Room Air O2 Flow Rate: O2 Flow Rate (L/min): 5 L/min  Intake/output summary:   Intake/Output Summary (Last 24 hours) at 05/25/2020 1109 Last data filed at 05/25/2020 0445 Gross per 24 hour  Intake -  Output 350 ml  Net -350 ml   LBM: Last BM Date: 05/23/20 Baseline Weight: Weight: 65.2 kg Most recent weight: Weight: 65.2 kg    Flowsheet Rows   [employer] Most Recent Value  Intake Tab   Referral Department Hospitalist  Unit at Time of Referral Med/Surg Unit  Palliative Care Primary Diagnosis Sepsis/Infectious Disease  Date Notified 05/11/2020  Palliative Care Type New Palliative care  Reason for referral Clarify Goals of Care  Date of Admission 05/13/2020  Date first seen by Palliative Care 05/21/20  # of days Palliative referral response time 1 Day(s)  # of days IP prior to Palliative referral 0  Clinical Assessment   Palliative Performance Scale Score 30%  Psychosocial & Spiritual Assessment   Palliative Care Outcomes       Patient Active Problem List   Diagnosis Date Noted  . Goals of care, counseling/discussion   . Palliative care by specialist   . COVID-19 05/09/2020  . Failure to thrive in adult 05/09/2020  . CAP (community acquired pneumonia) 05/09/2020  . Debility 05/09/2020  . Acute kidney injury ([place]) 10/19/2016  . Dizziness 10/19/2016  . CKD (chronic kidney disease) stage 4, GFR 15-29 ml/min (HCC) 10/19/2016  . Hypothyroidism, adult 10/19/2016  . Abnormal urinalysis 10/19/2016  . Diabetes mellitus type 2, uncontrolled ([place]) 10/19/2016  . Anemia due to chronic kidney disease 10/19/2016  . Thrombocytopenia ([place]) 10/19/2016  . Candidal dermatitis 10/19/2016  . Stress incontinence 10/19/2016  . V-tach ([place]) 10/19/2016  . Pressure injury of skin 10/19/2016  . Diabetes mellitus with complication ([place])   .  Urinary tract infection without hematuria   . CKD (chronic kidney disease), stage III ([place]) 05/20/2016  . Hypothyroidism 05/20/2016  . Closed fracture of right tibial plateau 05/12/2016  . Anemia in chronic kidney disease 12/16/2015  . Dizzy 07/07/2011  . Dehydration 07/07/2011  . Type II diabetes mellitus ([place]) 07/06/2011  . History of long-term treatment with high-risk medication 08/29/2010  . Abnormal PFTs (pulmonary function tests)   . History of ventricular tachycardia   . HTN (hypertension)   . Hyperlipidemia   . Gout     Palliative Care Assessment & Plan   Recommendations/Plan:  DNR/DNI  Leslie Coleman is actively dying.  Continue full comfort measures.  I anticipate this will be a hospital death.  Visitation per end of life policy  Palliative remains available for any symptom management needs.  Please call [phone] if further palliative care input is required.  Goals of Care and Additional Recommendations:  Limitations on Scope of Treatment: Full Comfort Care  Code Status:    Code Status Orders  (From admission, onward)         Start     Ordered   05/22/20 1231  Do not attempt resuscitation (DNR)  Continuous       Question Answer Comment  In the event of cardiac or respiratory ARREST Do not call a "code blue"   In the event of cardiac or respiratory ARREST Do not perform Intubation, CPR, defibrillation or ACLS   In the event of cardiac or respiratory ARREST Use medication by any route, position, wound care, and other measures to relive pain and suffering. May use oxygen, suction and manual treatment of airway obstruction as needed for comfort.      05/22/20 1232        Code Status History    Date Active Date Inactive Code Status Order ID Comments User Context   05/07/2020 1744 05/22/2020 1232 DNR [id number]  Harold Hedge, MD ED   05/09/2020 1422 05/10/2020 2114 Full Code [id number]  Harold Hedge, MD ED   10/19/2016 1736 10/20/2016 2030 Full Code [id number]   Samella Parr, NP Inpatient  05/08/2016 1936 05/12/2016 1902 Full Code [id number]  Eber Jones, MD Inpatient   Advance Care Planning Activity    Advance Directive Documentation   Flowsheet Row Most Recent Value  Type of Advance Directive Out of facility DNR (pink MOST or yellow form)  Pre-existing out of facility DNR order (yellow form or pink MOST form) Yellow form placed in chart (order not valid for inpatient use)  "MOST" Form in Place? -       Prognosis:   Hours - Days  Discharge Planning:  Anticipate hospital death.  Care plan was discussed with daughter, Dr. Posey Pronto  Thank you for allowing the Palliative Medicine Team to assist in the care of this patient.   Time In: 1400 Time Out: 1420 Total Time 20 Prolonged Time Billed  No   Greater than 50%  of this time was spent counseling and coordinating care related to the above assessment and plan.  Micheline Rough, MD  Please contact Palliative Medicine Team phone at [phone] for questions and concerns.

## 2020-05-25 NOTE — Progress Notes (Signed)
Patient watch and wedding ring given to daughter [name]).

## 2020-05-25 NOTE — Progress Notes (Signed)
[facility]  PROGRESS NOTE  Leslie Coleman EHU:[id number] DOB: 1928/12/19 DOA: 05/13/2020 PCP: System, Provider Not In   Brief HPI:   87 year old female who is unvaccinated against COVID-19, past medical history of hypertension, hyperlipidemia, hypothyroidism, diabetes mellitus type 2, V. tach initially treated with procainamide and now on amiodarone, stress incontinence, CKD stage IV presented with failure to thrive for several days.  She was diagnosed with COVID-19 infection on 05/04/2020 and was given multiple antibodies however she was admitted on 05/09/2020 and patient was discharged home her husband was dying at home under hospice care.  Patient has been confused, coughing with poor p.o. intake. Patient was admitted with failure to thrive, acute hypoxemic respiratory failure due to COVID-19 pneumonia.   Subjective   Agitated, moaning and groaning.  No nausea no vomiting.  No fever no chills.   Assessment/Plan:     1. Acute hypoxemic respiratory failure-secondary to COVID-19 pneumonia also concern for community-acquired pneumonia.  Patient was treated with IV steroids as well as IV antibiotics including IV remdesivir.  Condition continues to worsen.  Family decided transition to comfort care. 2. Failure to thrive-secondary to 1, currently comfort care 3. CKD stage IV-creatinine has been slowly trending upwards over the past 1 year, likely at baseline. 4. Elevated troponin-mild elevation of troponin, likely from demand ischemia. 5. Diabetes mellitus type 2-continue sliding scale insulin NovoLog. 6. History of VT-continue amiodarone 7. Hypertension-continue amlodipine 8. Goals of care-palliative care discussed with patient's family, patient is made full comfort care.  Currently actively dying.  Not stable for transfer.  Will initiate Dilaudid drip due to ongoing pain    Lab Results  Component Value Date   SARSCOV2NAA POSITIVE (A) 05/04/2020   [place] NEGATIVE 04/29/2020      Scheduled medications:   . fentaNYL  1 patch Transdermal Q72H  . LORazepam  0.5 mg Intravenous TID  . sodium chloride flush  3 mL Intravenous Q12H     CBG: Recent Labs  Lab 05/21/20 1158 05/21/20 1633 05/21/20 2248 05/22/20 0806 05/22/20 1158  GLUCAP 231* 214* 195* 277* 374*    SpO2: (!) 86 % O2 Flow Rate (L/min): 5 L/min    CBC: Recent Labs  Lab 04/30/2020 1317 05/21/20 0405 05/22/20 0427  WBC 17.0* 10.7* 11.9*  NEUTROABS 15.7* 10.0* 11.2*  HGB 9.0* 8.8* 8.8*  HCT 27.4* 26.8* 26.6*  MCV 84.3 84.5 84.4  PLT 112* 97* 127*    Basic Metabolic Panel: Recent Labs  Lab 05/02/2020 1317 05/21/20 0405 05/22/20 0427  NA 135 136 138  K 4.3 4.4 4.3  CL 111 110 112*  CO2 14* 14* 13*  GLUCOSE 273* 340* 280*  BUN 44* 50* 60*  CREATININE 2.67* 2.59* 2.93*  CALCIUM 8.0* 7.9* 8.0*     Liver Function Tests: Recent Labs  Lab 04/26/2020 1317 05/21/20 0405 05/22/20 0427  AST 35 30 41  ALT 29 29 36  ALKPHOS 181* 164* 178*  BILITOT 0.9 0.6 0.5  PROT 6.1* 5.8* 5.8*  ALBUMIN 2.5* 2.3* 2.3*     Antibiotics: Anti-infectives (From admission, onward)   Start     Dose/Rate Route Frequency Ordered Stop   05/21/20 1600  cefTRIAXone (ROCEPHIN) 2 g in sodium chloride 0.9 % 100 mL IVPB  Status:  Discontinued        2 g 200 mL/hr over 30 Minutes Intravenous Every 24 hours 04/25/2020 1744 05/22/20 1229   05/21/20 1600  azithromycin (ZITHROMAX) 500 mg in sodium chloride 0.9 % 250 mL IVPB  Status:  Discontinued        500 mg 250 mL/hr over 60 Minutes Intravenous Every 24 hours 05/14/2020 1744 05/22/20 1229   05/21/20 1000  remdesivir 100 mg in sodium chloride 0.9 % 100 mL IVPB  Status:  Discontinued       "Followed by" Linked Group Details   100 mg 200 mL/hr over 30 Minutes Intravenous Daily 04/21/2020 1743 05/22/20 1229   05/02/2020 1930  remdesivir 200 mg in sodium chloride 0.9% 250 mL IVPB       "Followed by" Linked Group Details   200 mg 580 mL/hr over 30 Minutes Intravenous  Once 04/23/2020 1743 05/01/2020 2046   05/05/2020 1515  cefTRIAXone (ROCEPHIN) 1 g in sodium chloride 0.9 % 100 mL IVPB        1 g 200 mL/hr over 30 Minutes Intravenous  Once 05/01/2020 1508 04/28/2020 1626   05/05/2020 1515  azithromycin (ZITHROMAX) 500 mg in sodium chloride 0.9 % 250 mL IVPB        500 mg 250 mL/hr over 60 Minutes Intravenous  Once 04/25/2020 1508 05/01/2020 1707       DVT prophylaxis: Apixaban  Code Status: DNR/DNI, comfort care  Family Communication: No family at bedside   Consultants:  Palliative care   Procedures:      Objective   Vitals:   05/22/20 2057 05/23/20 2106 05/24/20 1958 05/25/20 0445  BP: (!) 125/50 (!) 134/55    Pulse: 70 68    Resp: 16 20    Temp: 97.7 F (36.5 C) 98 F (36.7 C)    TempSrc: Oral     SpO2: 91% (!) 86%    Weight:    65.2 kg  Height:   5\' 2"  (1.575 m) 5\' 2"  (1.575 m)    Intake/Output Summary (Last 24 hours) at 05/25/2020 2112 Last data filed at 05/25/2020 1500 Gross per 24 hour  Intake 3.81 ml  Output 350 ml  Net -346.19 ml    01/03 0701 - 01/04 1900 In: 3.8 [I.V.:3.8] Out: 350 [Urine:350]  Filed Weights   05/25/20 0445  Weight: 65.2 kg    Physical Examination:   General: Moderate distress, agitated, no rash.   Cardiovascular: S1 and S2 Present, no Murmur, Respiratory: increased respiratory effort, Bilateral Air entry present and bilateral  Crackles, no wheezes Abdomen: Bowel Sound present  Status is: Inpatient  Dispo: The patient is from: Home              Anticipated d/c is to: Home              Anticipated d/c date is: 05/26/2019              Patient currently not stable for discharge  Barrier to discharge-residential hospice placement  Pressure Injury 10/19/16 Stage I -  Intact skin with non-blanchable redness of a localized area usually over a bony prominence. (Active)  10/19/16 1741  Location: Buttocks  Location Orientation: Medial  Staging: Stage I -  Intact skin with non-blanchable redness of a  localized area usually over a bony prominence.  Wound Description (Comments):   Present on Admission: Yes           Data Reviewed:   Recent Results (from the past 240 hour(s))  Culture, blood (routine x 2)     Status: Abnormal   Collection Time: 04/25/2020  1:47 PM   Specimen: BLOOD LEFT FOREARM  Result Value Ref Range Status   Specimen Description   Final    BLOOD LEFT  FOREARM Performed at [facility], [place] [address], [place], [place] [address]    Special Requests   Final    BOTTLES DRAWN AEROBIC AND ANAEROBIC Blood Culture adequate volume Performed at [facility] [address] [place], [place] [address]    Culture  Setup Time   Final    GRAM POSITIVE COCCI IN CLUSTERS IN BOTH AEROBIC AND ANAEROBIC BOTTLES Organism ID to follow CRITICAL RESULT CALLED TO, READ BACK BY AND VERIFIED WITH: Guadlupe Spanish PharmD 15:05 05/21/20 ([email or web address])    Culture (A)  Final    STAPHYLOCOCCUS EPIDERMIDIS THE SIGNIFICANCE OF ISOLATING THIS ORGANISM FROM A SINGLE SET OF BLOOD CULTURES WHEN MULTIPLE SETS ARE DRAWN IS UNCERTAIN. PLEASE NOTIFY THE MICROBIOLOGY DEPARTMENT WITHIN ONE WEEK IF SPECIATION AND SENSITIVITIES ARE REQUIRED. Performed at [facility], [place] [address] [place], [place] [address]    Report Status 05/23/2020 FINAL  Final  Culture, blood (routine x 2)     Status: None   Collection Time: 05/08/2020  1:47 PM   Specimen: BLOOD  Result Value Ref Range Status   Specimen Description   Final    BLOOD RIGHT ANTECUBITAL Performed at [facility] [address], [place], [place] [address]    Special Requests   Final    BOTTLES DRAWN AEROBIC AND ANAEROBIC Blood Culture adequate volume Performed at [facility] [address] [place], [place] [address]    Culture   Final    NO GROWTH 5 DAYS Performed at [facility], [place] [address], [place],  [address]    Report Status 05/25/2020 FINAL   Final  Blood Culture ID Panel (Reflexed)     Status: Abnormal   Collection Time: 05/03/2020  1:47 PM  Result Value Ref Range Status   Enterococcus faecalis NOT DETECTED NOT DETECTED Final   Enterococcus Faecium NOT DETECTED NOT DETECTED Final   Listeria monocytogenes NOT DETECTED NOT DETECTED Final   Staphylococcus species DETECTED (A) NOT DETECTED Final    Comment: CRITICAL RESULT CALLED TO, READ BACK BY AND VERIFIED WITH: Guadlupe Spanish PharmD 15:05 05/21/20 ([email or web address])    Staphylococcus aureus (BCID) NOT DETECTED NOT DETECTED Final   Staphylococcus epidermidis DETECTED (A) NOT DETECTED Final    Comment: Methicillin (oxacillin) resistant coagulase negative staphylococcus. Possible blood culture contaminant (unless isolated from more than one blood culture draw or clinical case suggests pathogenicity). No antibiotic treatment is indicated for blood  culture contaminants. CRITICAL RESULT CALLED TO, READ BACK BY AND VERIFIED WITH: Guadlupe Spanish PharmD 15:05 05/21/20 ([email or web address])    Staphylococcus lugdunensis NOT DETECTED NOT DETECTED Final   Streptococcus species NOT DETECTED NOT DETECTED Final   Streptococcus agalactiae NOT DETECTED NOT DETECTED Final   Streptococcus pneumoniae NOT DETECTED NOT DETECTED Final   Streptococcus pyogenes NOT DETECTED NOT DETECTED Final   A.calcoaceticus-baumannii NOT DETECTED NOT DETECTED Final   Bacteroides fragilis NOT DETECTED NOT DETECTED Final   Enterobacterales NOT DETECTED NOT DETECTED Final   Enterobacter cloacae complex NOT DETECTED NOT DETECTED Final   Escherichia coli NOT DETECTED NOT DETECTED Final   Klebsiella aerogenes NOT DETECTED NOT DETECTED Final   Klebsiella oxytoca NOT DETECTED NOT DETECTED Final   Klebsiella pneumoniae NOT DETECTED NOT DETECTED Final   Proteus species NOT DETECTED NOT DETECTED Final   Salmonella species NOT DETECTED NOT DETECTED Final   Serratia marcescens NOT DETECTED NOT DETECTED Final   Haemophilus influenzae NOT DETECTED  NOT DETECTED Final   Neisseria meningitidis NOT  DETECTED NOT DETECTED Final   Pseudomonas aeruginosa NOT DETECTED NOT DETECTED Final   Stenotrophomonas maltophilia NOT DETECTED NOT DETECTED Final   Candida albicans NOT DETECTED NOT DETECTED Final   Candida auris NOT DETECTED NOT DETECTED Final   Candida glabrata NOT DETECTED NOT DETECTED Final   Candida krusei NOT DETECTED NOT DETECTED Final   Candida parapsilosis NOT DETECTED NOT DETECTED Final   Candida tropicalis NOT DETECTED NOT DETECTED Final   Cryptococcus neoformans/gattii NOT DETECTED NOT DETECTED Final   Methicillin resistance mecA/C DETECTED (A) NOT DETECTED Final    Comment: CRITICAL RESULT CALLED TO, READ BACK BY AND VERIFIED WITH: Guadlupe Spanish PharmD 15:05 05/21/20 ([email or web address]) Performed at [facility], [address] [place], [place] [address]     No results for input(s): LIPASE, AMYLASE in the last 168 hours. No results for input(s): AMMONIA in the last 168 hours.  Cardiac Enzymes: No results for input(s): CKTOTAL, CKMB, CKMBINDEX, TROPONINI in the last 168 hours. BNP (last 3 results) Recent Labs    05/17/2020 1317  BNP 300.7*    ProBNP (last 3 results) No results for input(s): PROBNP in the last 8760 hours.  Studies:  No results found.     Berle Mull   05/25/2020, 9:12 PM  LOS: 5 days

## 2020-05-26 DIAGNOSIS — J189 Pneumonia, unspecified organism: Secondary | ICD-10-CM | POA: Diagnosis not present

## 2020-05-31 ENCOUNTER — Ambulatory Visit: Payer: Medicare Other

## 2020-05-31 ENCOUNTER — Ambulatory Visit: Payer: Medicare Other | Admitting: Hematology and Oncology

## 2020-05-31 ENCOUNTER — Other Ambulatory Visit: Payer: Medicare Other

## 2020-06-22 NOTE — Progress Notes (Signed)
Pt was found unresponsive, not breathing and no pulse. Charge nurse notified and pts chest auscultated for 1 minute by 2 RNs with no heartbeat found. Time of death 64. MD notified via [place]. Daughter [name] notified. Fentanyl patch removed and Dilaudid infusion stopped. 4ml of Dilaudid medication wasted in Stericycle and Fentanyl patch placed in [place], witnessed by Vanessa Barbara.

## 2020-06-22 NOTE — Progress Notes (Signed)
   06/08/2020 0300  Attending Physican Contact  Attending Physician Notified Y  Attending Physician (First and Last Name) B. Morrison  Post Mortem Checklist  Date of Death 06/05/2020  Time of Death 0235  Pronounced By  RN/ Emily RN  Next of kin notified Yes  Name of next of kin notified of death Cindy Manning  Contact Person's Relationship to Patient Daughter  Contact Person's Phone Number [phone]  Contact Person's address [address] [place], [place] [address]  Was the patient a No Code Blue or a Limited Code Blue? Yes  Did the patient die unattended? Yes  Patient restrained? Not applicable  Body preparation complete Y  [facility]  Notification Date 06/06/2020  Notification Time 0310  [place] [facility] Number [phone]  Is patient a potential donor? N  Autopsy  Autopsy requested by N/A  Patient and Hospital Property Returned  Patient belongings from bedside/safe/pharmacy returned  None  Dermatherapy linen/gowns NOT sent with patient or transporter Disposable Patient Transfer/ Apparel Kit used  Notifications  Patient Placement notified that Post Mortem checklist is complete Yes  Patient Placement notified body transferred Transported to morgue  Medical Examiner  Is this a medical examiner's case? N  Funeral Home  Funeral home name/address/phone # Funeral Home unknown/ undecided  Planned location of pickup Morgue   

## 2020-06-22 NOTE — Discharge Summary (Signed)
[facility] Death Summary   Patient: Leslie Coleman CZY:[id number]   PCP: System, Provider Not In DOB: 01-10-1929    Admit Date:  June 16, 2020  Date of Death: Date of Death: 06/22/20  Time of Death: Time of Death: 0235  Length of Stay: [facility] Diagnoses:  Principle Cause of death covid 63 pneumonia   Active Problems:   History of ventricular tachycardia   HTN (hypertension)   Hyperlipidemia   Dehydration   CKD (chronic kidney disease) stage 4, GFR 15-29 ml/min (HCC)   COVID-19   Failure to thrive in adult   CAP (community acquired pneumonia)   Debility   Goals of care, counseling/discussion   Palliative care by specialist   History of present illness: As per the H and P dictated on admission, "This is an 87 year old female who is unvaccinated against COVID-19 with a past medical history of hypertension, hyperlipidemia, hypothyroidism, type 2 diabetes, V. Tach (initially treated with procainamide and now on amiodarone), stress incontinence, CKD 4 who presented with failure to thrive for several days.  She was diagnosed with COVID-19 on 12/14 and given monoclonal antibodies however eventually was admitted for failure to thrive at home from 12/19 and 12/20 and was recommended SNF at discharge.  However, the patient's husband was at home under hospice care and she wanted to be with him when he died.  The patient's husband has since passed away.  Apparently the family has been trying to get her to come back to the hospital but she has been refusing.  She has been confused, coughing and with poor p.o. intake.  EMS found the patient to be hypoxic and required NRB.  ED provider mention patient is under hospice care.  Unable to get in contact with family to confirm this.  Currently the patient states that she "hurts all over."She is not providing any specific complaints."  Hospital Course:  Acute hypoxemic respiratory failure-secondary to COVID-19 pneumonia also concern for  community-acquired pneumonia.  Patient was treated with IV steroids as well as IV antibiotics including IV remdesivir.  Condition continues to worsen.  Family decided transition to comfort care.  Failure to thrive-secondary to 1, currently comfort care  CKD stage IV-creatinine has been slowly trending upwards over the past 1 year, likely at baseline.  Elevated troponin-mild elevation of troponin, likely from demand ischemia.  Diabetes mellitus type 2- was on sliding scale insulin NovoLog.  History of VT- was on amiodarone  Hypertension-was on amlodipine  Goals of care-palliative care discussed with patient's family, patient is made full comfort care.  Was on Dilaudid drip due to ongoing pain  The patient was pronounced deceased at 2:35 Am , on Jun 22, 2020.  Procedures and Results:  none   Consultations:  none  The results of significant diagnostics from this hospitalization (including imaging, microbiology, ancillary and laboratory) are listed below for reference.    Significant Diagnostic Studies: CT Head Wo Contrast  Result Date: 04/29/2020 CLINICAL DATA:  Progressive weakness. EXAM: CT HEAD WITHOUT CONTRAST TECHNIQUE: Contiguous axial images were obtained from the base of the skull through the vertex without intravenous contrast. COMPARISON:  Sep 29, 2015 FINDINGS: Brain: There is mild to moderate severity cerebral atrophy with widening of the extra-axial spaces and ventricular dilatation. There are areas of decreased attenuation within the white matter tracts of the supratentorial brain, consistent with microvascular disease changes. Vascular: Areas of atherosclerotic calcification are again seen Skull: Negative for fracture or focal lesion. Hyperostosis is again seen. Sinuses/Orbits: No acute  finding. Other: None. IMPRESSION: 1. Generalized cerebral atrophy. 2. No acute intracranial abnormality. Electronically Signed   By: Virgina Norfolk M.D.   On: 04/29/2020 18:54   DG Chest  Port 1 View  Result Date: 04/27/2020 CLINICAL DATA:  COVID positive, dyspnea EXAM: PORTABLE CHEST 1 VIEW COMPARISON:  05/09/2020 chest radiograph. FINDINGS: Right rotated chest radiograph. Stable cardiomediastinal silhouette with top-normal heart size. No pneumothorax. No definite pleural effusion. Patchy opacities in the mid to lower lungs bilaterally, worsened. IMPRESSION: Worsening patchy opacities in the mid to lower lungs bilaterally, compatible with COVID-19 pneumonia. Electronically Signed   By: Ilona Sorrel M.D.   On: 05/11/2020 14:19   DG Chest Portable 1 View  Result Date: 05/09/2020 CLINICAL DATA:  Shortness of breath and cough. The patient tested positive for COVID-19 with a home test last week. EXAM: PORTABLE CHEST 1 VIEW COMPARISON:  Single-view of the chest 05/04/2020 and 04/29/2020. PA and lateral chest 10/19/2016. FINDINGS: There is some patchy airspace disease in the left lung base. A few calcified granulomata are seen in the right upper lobe. Right lung is otherwise clear. Heart size is normal. Aortic atherosclerosis. No pneumothorax or pleural effusion. IMPRESSION: No marked change in patchy left basilar airspace disease worrisome for pneumonia since the most recent examination. Electronically Signed   By: Inge Rise M.D.   On: 05/09/2020 12:25   DG Chest Port 1 View  Result Date: 05/04/2020 CLINICAL DATA:  Cough.  Possible COVID. EXAM: PORTABLE CHEST 1 VIEW COMPARISON:  04/29/2020. FINDINGS: Mediastinum and hilar structures normal. Low lung volumes with mild bibasilar atelectasis/infiltrates. No pleural effusion or pneumothorax. Degenerative changes scoliosis thoracic spine. IMPRESSION: Low lung volumes with mild bibasilar atelectasis/infiltrates. Electronically Signed   By: Marcello Moores  Register   On: 05/04/2020 12:56   DG Chest Portable 1 View  Result Date: 04/29/2020 CLINICAL DATA:  Status post fall. EXAM: PORTABLE CHEST 1 VIEW COMPARISON:  Oct 19, 2016 FINDINGS: The heart  size and mediastinal contours are within normal limits. There is moderate severity calcification of the thoracic aorta. Both lungs are clear. Degenerative changes seen throughout the thoracic spine. IMPRESSION: 1. No active disease. Electronically Signed   By: Virgina Norfolk M.D.   On: 04/29/2020 18:41   DG Hand Complete Left  Result Date: 04/29/2020 CLINICAL DATA:  Status post fall. EXAM: LEFT HAND - COMPLETE 3+ VIEW COMPARISON:  None. FINDINGS: A radiopaque watch is seen overlying the distal left forearm, with a radiopaque ring overlying the proximal phalanx of the fourth left finger. These areas are subsequently limited evaluation. There is no evidence of an acute fracture or dislocation. Moderate severity degenerative changes are seen throughout the left wrist and along the carpometacarpal articulation of the left thumb. Additional degenerative changes seen throughout the interphalangeal joints of the left hand. Soft tissues are unremarkable. IMPRESSION: 1. No evidence of an acute fracture or dislocation. 2. Moderate severity degenerative changes, as described above. Electronically Signed   By: Virgina Norfolk M.D.   On: 04/29/2020 18:40   DG Hand Complete Right  Result Date: 04/29/2020 CLINICAL DATA:  Fall EXAM: RIGHT HAND - COMPLETE 3+ VIEW COMPARISON:  None. FINDINGS: Physiologic alignment with approximation of the joints. Diffuse joint space loss. Degenerative spurring, small geode formation and subchondral sclerosis predominantly involving the first [facility], first IP and 2/4th IP joints. Osteopenia. No fracture. Prominence of the soft tissues. Vascular calcifications. IMPRESSION: 1. No acute fracture or dislocation.  Osteopenia. 2. Mild-to-moderate osteoarthrosis. Electronically Signed   By: Milus Mallick.D.  On: 04/29/2020 18:37    Microbiology: Recent Results (from the past 240 hour(s))  Culture, blood (routine x 2)     Status: Abnormal   Collection Time: 04/26/2020  1:47 PM    Specimen: BLOOD LEFT FOREARM  Result Value Ref Range Status   Specimen Description   Final    BLOOD LEFT FOREARM Performed at [facility], [place] [address] [place], [place] [address]    Special Requests   Final    BOTTLES DRAWN AEROBIC AND ANAEROBIC Blood Culture adequate volume Performed at [facility] [address] [place], [place] [address]    Culture  Setup Time   Final    GRAM POSITIVE COCCI IN CLUSTERS IN BOTH AEROBIC AND ANAEROBIC BOTTLES Organism ID to follow CRITICAL RESULT CALLED TO, READ BACK BY AND VERIFIED WITH: Guadlupe Spanish PharmD 15:05 05/21/20 ([email or web address])    Culture (A)  Final    STAPHYLOCOCCUS EPIDERMIDIS THE SIGNIFICANCE OF ISOLATING THIS ORGANISM FROM A SINGLE SET OF BLOOD CULTURES WHEN MULTIPLE SETS ARE DRAWN IS UNCERTAIN. PLEASE NOTIFY THE MICROBIOLOGY DEPARTMENT WITHIN ONE WEEK IF SPECIATION AND SENSITIVITIES ARE REQUIRED. Performed at [facility], [place] [address], [place], [place] [address]    Report Status 05/23/2020 FINAL  Final  Culture, blood (routine x 2)     Status: None   Collection Time: 05/14/2020  1:47 PM   Specimen: BLOOD  Result Value Ref Range Status   Specimen Description   Final    BLOOD RIGHT ANTECUBITAL Performed at [facility] [address] [place], [place] [address]    Special Requests   Final    BOTTLES DRAWN AEROBIC AND ANAEROBIC Blood Culture adequate volume Performed at [facility] [address], [place], [place] [address]    Culture   Final    NO GROWTH 5 DAYS Performed at [facility], [place] [address] [place], [place] [address]    Report Status 05/25/2020 FINAL  Final  Blood Culture ID Panel (Reflexed)     Status: Abnormal   Collection Time: 04/24/2020  1:47 PM  Result Value Ref Range Status   Enterococcus faecalis NOT DETECTED NOT DETECTED Final   Enterococcus Faecium NOT DETECTED NOT DETECTED Final   Listeria monocytogenes NOT  DETECTED NOT DETECTED Final   Staphylococcus species DETECTED (A) NOT DETECTED Final    Comment: CRITICAL RESULT CALLED TO, READ BACK BY AND VERIFIED WITH: Guadlupe Spanish PharmD 15:05 05/21/20 ([email or web address])    Staphylococcus aureus (BCID) NOT DETECTED NOT DETECTED Final   Staphylococcus epidermidis DETECTED (A) NOT DETECTED Final    Comment: Methicillin (oxacillin) resistant coagulase negative staphylococcus. Possible blood culture contaminant (unless isolated from more than one blood culture draw or clinical case suggests pathogenicity). No antibiotic treatment is indicated for blood  culture contaminants. CRITICAL RESULT CALLED TO, READ BACK BY AND VERIFIED WITH: Guadlupe Spanish PharmD 15:05 05/21/20 ([email or web address])    Staphylococcus lugdunensis NOT DETECTED NOT DETECTED Final   Streptococcus species NOT DETECTED NOT DETECTED Final   Streptococcus agalactiae NOT DETECTED NOT DETECTED Final   Streptococcus pneumoniae NOT DETECTED NOT DETECTED Final   Streptococcus pyogenes NOT DETECTED NOT DETECTED Final   A.calcoaceticus-baumannii NOT DETECTED NOT DETECTED Final   Bacteroides fragilis NOT DETECTED NOT DETECTED Final   Enterobacterales NOT DETECTED NOT DETECTED Final   Enterobacter cloacae complex NOT DETECTED NOT DETECTED Final   Escherichia coli NOT DETECTED NOT DETECTED Final   Klebsiella aerogenes NOT DETECTED NOT DETECTED Final  Klebsiella oxytoca NOT DETECTED NOT DETECTED Final   Klebsiella pneumoniae NOT DETECTED NOT DETECTED Final   Proteus species NOT DETECTED NOT DETECTED Final   Salmonella species NOT DETECTED NOT DETECTED Final   Serratia marcescens NOT DETECTED NOT DETECTED Final   Haemophilus influenzae NOT DETECTED NOT DETECTED Final   Neisseria meningitidis NOT DETECTED NOT DETECTED Final   Pseudomonas aeruginosa NOT DETECTED NOT DETECTED Final   Stenotrophomonas maltophilia NOT DETECTED NOT DETECTED Final   Candida albicans NOT DETECTED NOT DETECTED Final   Candida auris NOT  DETECTED NOT DETECTED Final   Candida glabrata NOT DETECTED NOT DETECTED Final   Candida krusei NOT DETECTED NOT DETECTED Final   Candida parapsilosis NOT DETECTED NOT DETECTED Final   Candida tropicalis NOT DETECTED NOT DETECTED Final   Cryptococcus neoformans/gattii NOT DETECTED NOT DETECTED Final   Methicillin resistance mecA/C DETECTED (A) NOT DETECTED Final    Comment: CRITICAL RESULT CALLED TO, READ BACK BY AND VERIFIED WITH: Guadlupe Spanish PharmD 15:05 05/21/20 ([email or web address]) Performed at [facility], [address] [place], [place] [address]      Labs: CBC: Recent Labs  Lab 04/28/2020 1317 05/21/20 0405 05/22/20 0427  WBC 17.0* 10.7* 11.9*  NEUTROABS 15.7* 10.0* 11.2*  HGB 9.0* 8.8* 8.8*  HCT 27.4* 26.8* 26.6*  MCV 84.3 84.5 84.4  PLT 112* 97* 846*   Basic Metabolic Panel: Recent Labs  Lab 04/23/2020 1317 05/21/20 0405 05/22/20 0427  NA 135 136 138  K 4.3 4.4 4.3  CL 111 110 112*  CO2 14* 14* 13*  GLUCOSE 273* 340* 280*  BUN 44* 50* 60*  CREATININE 2.67* 2.59* 2.93*  CALCIUM 8.0* 7.9* 8.0*   Liver Function Tests: Recent Labs  Lab 05/10/2020 1317 05/21/20 0405 05/22/20 0427  AST 35 30 41  ALT 29 29 36  ALKPHOS 181* 164* 178*  BILITOT 0.9 0.6 0.5  PROT 6.1* 5.8* 5.8*  ALBUMIN 2.5* 2.3* 2.3*   Cardiac Enzymes: No results for input(s): CKTOTAL, CKMB, CKMBINDEX, TROPONINI in the last 168 hours.  Time spent: 30 minutes  Signed:  Berle Mull  [facility]  June 17, 2020

## 2020-06-22 DEATH — deceased

## 2020-08-04 ENCOUNTER — Ambulatory Visit: Payer: Medicare Other | Admitting: Internal Medicine
